# Patient Record
Sex: Female | Born: 1955 | Race: White | Hispanic: No | Marital: Single | Smoking: Former smoker
Health system: Southern US, Community
[De-identification: ages and names within clinical notes are randomized; demographics above are authoritative.]

## PROBLEM LIST (undated history)

## (undated) DIAGNOSIS — E039 Hypothyroidism, unspecified: Secondary | ICD-10-CM

## (undated) DIAGNOSIS — T8859XA Other complications of anesthesia, initial encounter: Secondary | ICD-10-CM

## (undated) DIAGNOSIS — G2581 Restless legs syndrome: Secondary | ICD-10-CM

## (undated) DIAGNOSIS — M545 Low back pain, unspecified: Secondary | ICD-10-CM

## (undated) DIAGNOSIS — G8929 Other chronic pain: Secondary | ICD-10-CM

## (undated) DIAGNOSIS — G4733 Obstructive sleep apnea (adult) (pediatric): Secondary | ICD-10-CM

## (undated) DIAGNOSIS — I480 Paroxysmal atrial fibrillation: Secondary | ICD-10-CM

## (undated) DIAGNOSIS — I2510 Atherosclerotic heart disease of native coronary artery without angina pectoris: Secondary | ICD-10-CM

## (undated) DIAGNOSIS — T4145XA Adverse effect of unspecified anesthetic, initial encounter: Secondary | ICD-10-CM

## (undated) DIAGNOSIS — N200 Calculus of kidney: Secondary | ICD-10-CM

## (undated) DIAGNOSIS — K219 Gastro-esophageal reflux disease without esophagitis: Secondary | ICD-10-CM

## (undated) DIAGNOSIS — I509 Heart failure, unspecified: Secondary | ICD-10-CM

## (undated) DIAGNOSIS — J9622 Acute and chronic respiratory failure with hypercapnia: Secondary | ICD-10-CM

## (undated) DIAGNOSIS — R4182 Altered mental status, unspecified: Secondary | ICD-10-CM

## (undated) DIAGNOSIS — R0902 Hypoxemia: Secondary | ICD-10-CM

## (undated) DIAGNOSIS — C541 Malignant neoplasm of endometrium: Secondary | ICD-10-CM

## (undated) DIAGNOSIS — I10 Essential (primary) hypertension: Secondary | ICD-10-CM

## (undated) DIAGNOSIS — F32A Depression, unspecified: Secondary | ICD-10-CM

## (undated) DIAGNOSIS — F329 Major depressive disorder, single episode, unspecified: Secondary | ICD-10-CM

## (undated) DIAGNOSIS — F988 Other specified behavioral and emotional disorders with onset usually occurring in childhood and adolescence: Secondary | ICD-10-CM

## (undated) DIAGNOSIS — R0689 Other abnormalities of breathing: Secondary | ICD-10-CM

## (undated) HISTORY — DX: Malignant neoplasm of endometrium: C54.1

## (undated) HISTORY — PX: DENTAL SURGERY: SHX609

## (undated) HISTORY — DX: Heart failure, unspecified: I50.9

## (undated) HISTORY — DX: Calculus of kidney: N20.0

## (undated) HISTORY — DX: Low back pain, unspecified: M54.50

## (undated) HISTORY — DX: Morbid (severe) obesity due to excess calories: E66.01

## (undated) HISTORY — DX: Acute and chronic respiratory failure with hypercapnia: J96.22

## (undated) HISTORY — DX: Hypothyroidism, unspecified: E03.9

## (undated) HISTORY — PX: BACK SURGERY: SHX140

## (undated) HISTORY — DX: Depression, unspecified: F32.A

## (undated) HISTORY — DX: Low back pain: M54.5

## (undated) HISTORY — DX: Major depressive disorder, single episode, unspecified: F32.9

## (undated) HISTORY — DX: Other chronic pain: G89.29

## (undated) HISTORY — DX: Gastro-esophageal reflux disease without esophagitis: K21.9

## (undated) HISTORY — PX: SP PERC NEPHROSTOMY: HXRAD349

## (undated) HISTORY — DX: Atherosclerotic heart disease of native coronary artery without angina pectoris: I25.10

## (undated) HISTORY — DX: Other specified behavioral and emotional disorders with onset usually occurring in childhood and adolescence: F98.8

## (undated) HISTORY — DX: Altered mental status, unspecified: R41.82

## (undated) HISTORY — DX: Obstructive sleep apnea (adult) (pediatric): G47.33

## (undated) HISTORY — PX: VAGINAL HYSTERECTOMY: SUR661

## (undated) HISTORY — DX: Hypoxemia: R09.02

## (undated) HISTORY — PX: TONSILLECTOMY: SUR1361

## (undated) HISTORY — PX: SHOULDER SURGERY: SHX246

## (undated) HISTORY — DX: Restless legs syndrome: G25.81

---

## 1998-02-11 ENCOUNTER — Ambulatory Visit (HOSPITAL_COMMUNITY): Admission: RE | Admit: 1998-02-11 | Discharge: 1998-02-11 | Payer: Self-pay | Admitting: *Deleted

## 1998-06-01 ENCOUNTER — Ambulatory Visit (HOSPITAL_COMMUNITY): Admission: RE | Admit: 1998-06-01 | Discharge: 1998-06-01 | Payer: Self-pay | Admitting: *Deleted

## 1998-06-24 ENCOUNTER — Ambulatory Visit (HOSPITAL_COMMUNITY): Admission: RE | Admit: 1998-06-24 | Discharge: 1998-06-24 | Payer: Self-pay | Admitting: Cardiovascular Disease

## 1999-03-21 ENCOUNTER — Ambulatory Visit (HOSPITAL_COMMUNITY): Admission: RE | Admit: 1999-03-21 | Discharge: 1999-03-21 | Payer: Self-pay | Admitting: Cardiovascular Disease

## 1999-09-19 ENCOUNTER — Encounter: Payer: Self-pay | Admitting: Cardiovascular Disease

## 1999-09-19 ENCOUNTER — Ambulatory Visit (HOSPITAL_COMMUNITY): Admission: RE | Admit: 1999-09-19 | Discharge: 1999-09-19 | Payer: Self-pay | Admitting: Cardiovascular Disease

## 1999-12-16 ENCOUNTER — Ambulatory Visit: Admission: RE | Admit: 1999-12-16 | Discharge: 1999-12-16 | Payer: Self-pay | Admitting: Psychiatry

## 2000-10-24 ENCOUNTER — Ambulatory Visit (HOSPITAL_BASED_OUTPATIENT_CLINIC_OR_DEPARTMENT_OTHER): Admission: RE | Admit: 2000-10-24 | Discharge: 2000-10-24 | Payer: Self-pay | Admitting: Orthopedic Surgery

## 2000-11-22 ENCOUNTER — Encounter: Admission: RE | Admit: 2000-11-22 | Discharge: 2001-02-06 | Payer: Self-pay | Admitting: Orthopedic Surgery

## 2002-09-26 ENCOUNTER — Emergency Department (HOSPITAL_COMMUNITY): Admission: EM | Admit: 2002-09-26 | Discharge: 2002-09-26 | Payer: Self-pay | Admitting: Emergency Medicine

## 2003-08-31 ENCOUNTER — Emergency Department (HOSPITAL_COMMUNITY): Admission: EM | Admit: 2003-08-31 | Discharge: 2003-08-31 | Payer: Self-pay | Admitting: Emergency Medicine

## 2005-09-05 ENCOUNTER — Encounter: Admission: RE | Admit: 2005-09-05 | Discharge: 2005-09-05 | Payer: Self-pay | Admitting: Cardiovascular Disease

## 2005-09-07 ENCOUNTER — Emergency Department (HOSPITAL_COMMUNITY): Admission: EM | Admit: 2005-09-07 | Discharge: 2005-09-07 | Payer: Self-pay | Admitting: Emergency Medicine

## 2005-09-08 ENCOUNTER — Ambulatory Visit (HOSPITAL_COMMUNITY): Admission: RE | Admit: 2005-09-08 | Discharge: 2005-09-09 | Payer: Self-pay | Admitting: Urology

## 2005-10-10 ENCOUNTER — Inpatient Hospital Stay (HOSPITAL_COMMUNITY): Admission: RE | Admit: 2005-10-10 | Discharge: 2005-10-12 | Payer: Self-pay | Admitting: Urology

## 2005-12-21 ENCOUNTER — Ambulatory Visit (HOSPITAL_BASED_OUTPATIENT_CLINIC_OR_DEPARTMENT_OTHER): Admission: RE | Admit: 2005-12-21 | Discharge: 2005-12-21 | Payer: Self-pay | Admitting: Urology

## 2006-01-30 ENCOUNTER — Ambulatory Visit: Payer: Self-pay | Admitting: Pulmonary Disease

## 2006-01-31 ENCOUNTER — Other Ambulatory Visit: Admission: RE | Admit: 2006-01-31 | Discharge: 2006-01-31 | Payer: Self-pay | Admitting: Obstetrics and Gynecology

## 2006-01-31 ENCOUNTER — Ambulatory Visit (HOSPITAL_BASED_OUTPATIENT_CLINIC_OR_DEPARTMENT_OTHER): Admission: RE | Admit: 2006-01-31 | Discharge: 2006-01-31 | Payer: Self-pay | Admitting: Pulmonary Disease

## 2006-02-13 ENCOUNTER — Ambulatory Visit: Payer: Self-pay | Admitting: Pulmonary Disease

## 2006-02-15 ENCOUNTER — Ambulatory Visit (HOSPITAL_COMMUNITY): Admission: RE | Admit: 2006-02-15 | Discharge: 2006-02-15 | Payer: Self-pay | Admitting: Cardiovascular Disease

## 2006-04-04 ENCOUNTER — Ambulatory Visit: Payer: Self-pay | Admitting: Pulmonary Disease

## 2006-05-08 ENCOUNTER — Encounter (INDEPENDENT_AMBULATORY_CARE_PROVIDER_SITE_OTHER): Payer: Self-pay | Admitting: *Deleted

## 2006-05-08 ENCOUNTER — Inpatient Hospital Stay (HOSPITAL_COMMUNITY): Admission: RE | Admit: 2006-05-08 | Discharge: 2006-05-11 | Payer: Self-pay | Admitting: Obstetrics and Gynecology

## 2006-05-31 ENCOUNTER — Ambulatory Visit: Payer: Self-pay | Admitting: Pulmonary Disease

## 2006-06-27 ENCOUNTER — Ambulatory Visit: Admission: RE | Admit: 2006-06-27 | Discharge: 2006-06-27 | Payer: Self-pay | Admitting: Gynecologic Oncology

## 2006-09-18 ENCOUNTER — Ambulatory Visit: Admission: RE | Admit: 2006-09-18 | Discharge: 2006-09-18 | Payer: Self-pay | Admitting: Gynecologic Oncology

## 2006-09-25 ENCOUNTER — Ambulatory Visit (HOSPITAL_COMMUNITY): Admission: RE | Admit: 2006-09-25 | Discharge: 2006-09-26 | Payer: Self-pay | Admitting: Gynecologic Oncology

## 2006-09-25 ENCOUNTER — Encounter (INDEPENDENT_AMBULATORY_CARE_PROVIDER_SITE_OTHER): Payer: Self-pay | Admitting: *Deleted

## 2006-10-23 ENCOUNTER — Ambulatory Visit: Admission: RE | Admit: 2006-10-23 | Discharge: 2006-10-23 | Payer: Self-pay | Admitting: Gynecologic Oncology

## 2006-10-29 ENCOUNTER — Encounter: Admission: RE | Admit: 2006-10-29 | Discharge: 2006-10-29 | Payer: Self-pay | Admitting: Cardiovascular Disease

## 2006-10-31 ENCOUNTER — Encounter: Admission: RE | Admit: 2006-10-31 | Discharge: 2006-10-31 | Payer: Self-pay | Admitting: Cardiovascular Disease

## 2006-11-07 ENCOUNTER — Inpatient Hospital Stay (HOSPITAL_COMMUNITY): Admission: AD | Admit: 2006-11-07 | Discharge: 2006-11-09 | Payer: Self-pay | Admitting: Cardiovascular Disease

## 2006-11-18 ENCOUNTER — Emergency Department (HOSPITAL_COMMUNITY): Admission: EM | Admit: 2006-11-18 | Discharge: 2006-11-18 | Payer: Self-pay | Admitting: Emergency Medicine

## 2006-11-29 ENCOUNTER — Ambulatory Visit: Payer: Self-pay | Admitting: Pulmonary Disease

## 2007-04-16 ENCOUNTER — Other Ambulatory Visit: Admission: RE | Admit: 2007-04-16 | Discharge: 2007-04-16 | Payer: Self-pay | Admitting: Gynecologic Oncology

## 2007-04-16 ENCOUNTER — Ambulatory Visit: Admission: RE | Admit: 2007-04-16 | Discharge: 2007-04-16 | Payer: Self-pay | Admitting: Gynecologic Oncology

## 2007-04-16 ENCOUNTER — Encounter (INDEPENDENT_AMBULATORY_CARE_PROVIDER_SITE_OTHER): Payer: Self-pay | Admitting: Specialist

## 2007-11-11 ENCOUNTER — Emergency Department (HOSPITAL_COMMUNITY): Admission: EM | Admit: 2007-11-11 | Discharge: 2007-11-11 | Payer: Self-pay | Admitting: Emergency Medicine

## 2007-11-11 ENCOUNTER — Inpatient Hospital Stay (HOSPITAL_COMMUNITY): Admission: EM | Admit: 2007-11-11 | Discharge: 2007-11-14 | Payer: Self-pay | Admitting: Emergency Medicine

## 2007-11-11 ENCOUNTER — Ambulatory Visit: Payer: Self-pay | Admitting: Pulmonary Disease

## 2007-12-23 DIAGNOSIS — G4733 Obstructive sleep apnea (adult) (pediatric): Secondary | ICD-10-CM | POA: Insufficient documentation

## 2009-02-01 ENCOUNTER — Ambulatory Visit (HOSPITAL_COMMUNITY): Admission: RE | Admit: 2009-02-01 | Discharge: 2009-02-01 | Payer: Self-pay | Admitting: Cardiovascular Disease

## 2011-01-01 ENCOUNTER — Encounter: Payer: Self-pay | Admitting: Cardiovascular Disease

## 2011-03-28 LAB — GLUCOSE, CAPILLARY
Glucose-Capillary: 105 mg/dL — ABNORMAL HIGH (ref 70–99)
Glucose-Capillary: 96 mg/dL (ref 70–99)

## 2011-03-30 ENCOUNTER — Emergency Department (HOSPITAL_COMMUNITY)
Admission: EM | Admit: 2011-03-30 | Discharge: 2011-03-30 | Disposition: A | Discharge status: Home / Self Care | Payer: Medicare Other | Attending: Emergency Medicine | Admitting: Emergency Medicine

## 2011-03-30 DIAGNOSIS — E860 Dehydration: Secondary | ICD-10-CM | POA: Insufficient documentation

## 2011-03-30 DIAGNOSIS — R748 Abnormal levels of other serum enzymes: Secondary | ICD-10-CM | POA: Insufficient documentation

## 2011-03-30 DIAGNOSIS — I509 Heart failure, unspecified: Secondary | ICD-10-CM | POA: Insufficient documentation

## 2011-03-30 DIAGNOSIS — F411 Generalized anxiety disorder: Secondary | ICD-10-CM | POA: Insufficient documentation

## 2011-03-30 DIAGNOSIS — I10 Essential (primary) hypertension: Secondary | ICD-10-CM | POA: Insufficient documentation

## 2011-03-30 DIAGNOSIS — R112 Nausea with vomiting, unspecified: Secondary | ICD-10-CM | POA: Insufficient documentation

## 2011-03-30 DIAGNOSIS — E119 Type 2 diabetes mellitus without complications: Secondary | ICD-10-CM | POA: Insufficient documentation

## 2011-03-30 LAB — CBC
Hemoglobin: 14.1 g/dL (ref 12.0–15.0)
MCH: 27.1 pg (ref 26.0–34.0)
MCHC: 30.7 g/dL (ref 30.0–36.0)
MCV: 88.1 fL (ref 78.0–100.0)
Platelets: 193 10*3/uL (ref 150–400)
RBC: 5.21 MIL/uL — ABNORMAL HIGH (ref 3.87–5.11)
RDW: 15.2 % (ref 11.5–15.5)
WBC: 16.9 10*3/uL — ABNORMAL HIGH (ref 4.0–10.5)

## 2011-03-30 LAB — URINALYSIS, ROUTINE W REFLEX MICROSCOPIC
Glucose, UA: NEGATIVE mg/dL
Ketones, ur: NEGATIVE mg/dL
Nitrite: NEGATIVE
Specific Gravity, Urine: 1.022 (ref 1.005–1.030)
Urobilinogen, UA: 0.2 mg/dL (ref 0.0–1.0)
pH: 5 (ref 5.0–8.0)

## 2011-03-30 LAB — COMPREHENSIVE METABOLIC PANEL
AST: 570 U/L — ABNORMAL HIGH (ref 0–37)
Albumin: 3.4 g/dL — ABNORMAL LOW (ref 3.5–5.2)
Alkaline Phosphatase: 101 U/L (ref 39–117)
BUN: 30 mg/dL — ABNORMAL HIGH (ref 6–23)
Chloride: 101 mEq/L (ref 96–112)
Creatinine, Ser: 1.75 mg/dL — ABNORMAL HIGH (ref 0.4–1.2)
GFR calc Af Amer: 36 mL/min — ABNORMAL LOW (ref >60)
Glucose, Bld: 121 mg/dL — ABNORMAL HIGH (ref 70–99)
Potassium: 5.1 mEq/L (ref 3.5–5.1)
Total Bilirubin: 1.4 mg/dL — ABNORMAL HIGH (ref 0.3–1.2)
Total Protein: 7.2 g/dL (ref 6.0–8.3)

## 2011-03-30 LAB — DIFFERENTIAL
Basophils Absolute: 0 10*3/uL (ref 0.0–0.1)
Basophils Relative: 0 % (ref 0–1)
Eosinophils Absolute: 0 10*3/uL (ref 0.0–0.7)
Eosinophils Relative: 0 % (ref 0–5)
Lymphocytes Relative: 6 % — ABNORMAL LOW (ref 12–46)
Lymphs Abs: 1.1 10*3/uL (ref 0.7–4.0)
Monocytes Relative: 4 % (ref 3–12)
Neutro Abs: 15.2 10*3/uL — ABNORMAL HIGH (ref 1.7–7.7)
Neutrophils Relative %: 90 % — ABNORMAL HIGH (ref 43–77)

## 2011-03-30 LAB — LIPASE, BLOOD: Lipase: 37 U/L (ref 11–59)

## 2011-03-30 LAB — URINE MICROSCOPIC-ADD ON

## 2011-04-25 NOTE — Discharge Summary (Signed)
[name], KANNAN               ACCOUNT NO.:  [id number]   MEDICAL RECORD NO.:  [id number]          PATIENT TYPE:  INP   LOCATION:  6708                         FACILITY:  [facility]   PHYSICIAN:  Devra Dopp, MSN, ACNP DATE OF BIRTH:  12/22/1955   DATE OF ADMISSION:  11/11/2007  DATE OF DISCHARGE:  11/14/2007                               DISCHARGE SUMMARY   No dictation for this job.      Devra Dopp, MSN, ACNP     SM/MEDQ  D:  11/14/2007  T:  11/14/2007  Job:  [id number]

## 2011-04-25 NOTE — Consult Note (Signed)
[name], [name]               ACCOUNT NO.:  [id number]   MEDICAL RECORD NO.:  [id number]          PATIENT TYPE:  OUT   LOCATION:  GYN                          FACILITY:  [facility]   PHYSICIAN:  John T. Kyla Balzarine, M.D.    DATE OF BIRTH:  1956/06/05   DATE OF CONSULTATION:  04/16/2007  DATE OF DISCHARGE:                                 CONSULTATION   CHIEF COMPLAINT:  Followup after completion vaginal trachelectomy for  endometrial cancer.   HISTORY OF PRESENT ILLNESS:  This patient had a supracervical  hysterectomy for adenomatous hyperplasia performed in May 2007.  Final  pathology revealed noninvasive endometrial adenocarcinoma, FIGO grade 1.  Tubes and ovaries were removed.  The patient underwent a completion  vaginal trachelectomy in October 2007 with findings only of chronic  cervicitis.  Perioperatively, she did well but in the past several  months apparently was hospitalized with congestive heart failure which  has responded to increasing her dose of Lasix.   PAST MEDICAL HISTORY:  Is significant for multiple concerns including:  1. Morbid obesity.  2. Adult-onset diabetes.  3. Hypertension.  4. Sleep apnea.  5. Depression.  6. Hypothyroidism.  7. GERD.  8. Kidney stones.  9. Status post ruptured disk repair x2.  10.Chronic back pain related to a third herniated disk.  11.Post nephrostomy tube for nephrolithiasis.  12.Shoulder surgery.  13.Remote tubal ligation.  14.Status post remote T&A.  15.Status post NSVD x2.   MEDICATIONS:  Include Synthroid, furosemide, potassium supplementation,  propranolol, Cozaar, Avandamet, Zyrtec, glipizide, methylphenidate,  metformin, Lantus insulin, Prevacid, lorazepam, Zoloft, sodium  bicarbonate, Vicodin, vitamin C, and ferrous sulfate.   ALLERGIES:  Include hyper sensitivity to NSAIDs, SULFA DRUGS and  ERYTHROMYCIN, but this appears to be not classical allergies to these  medical occasions  and seem to indicate increased  sensitivity to those  medications.   PERSONAL AND SOCIAL HISTORY:  Negative, nonsmoker.   FAMILY HISTORY:  Negative for gynecologic, breast or colon cancer.   REVIEW OF SYSTEMS:  Chronic pain syndrome related to back.  Frequent  GERD, but otherwise negative in 10 systems exam.   PHYSICAL EXAMINATION:  VITAL SIGNS:  Weight 212 pounds.  GENERAL:  The patient is anxious, alert and oriented x3 in no acute  distress.  ABDOMEN:  Soft and benign. Well-healed incision.  No hernia, mass or  organomegaly palpable.  BACK:  No back or CVA tenderness.  LYMPH:  No pathologic lymphadenopathy.  EXTREMITIES:  No tenderness or Homan's.  PELVIC:  External genitalia and BUS, bladder and urethra, and vaginal  mucosa are clear.  No mucosal lesions noted.  Bimanual and rectovaginal  examinations disclose no palpable mass, tenderness, absent uterus and  cervix.   ASSESSMENT:  Endometrial cancer.   PLAN:  The patient can be seen at annual intervals here and at the  interim examination, can see Dr. Huel Cote, such that each see  her every 6 months.      John T. Kyla Balzarine, M.D.  Electronically Signed     JTS/MEDQ  D:  04/16/2007  T:  04/16/2007  Job:  [id number]  cc:   Huel Cote, M.D.  Fax: [phone]   [name], M.D.  Fax: [phone]   Lindaann Slough, M.D.  Fax: [phone]   Roseanna Rainbow, M.D.  Fax: [phone]   Telford Nab, R.N.  [address]  [place], [place] [address]

## 2011-04-25 NOTE — Discharge Summary (Signed)
[name], [name]               ACCOUNT NO.:  [id number]   MEDICAL RECORD NO.:  [id number]          PATIENT TYPE:  INP   LOCATION:  6708                         FACILITY:  [facility]   PHYSICIAN:  Oretha Milch, MD      DATE OF BIRTH:  1956-02-14   DATE OF ADMISSION:  11/11/2007  DATE OF DISCHARGE:  11/14/2007                               DISCHARGE SUMMARY   DISCHARGE DIAGNOSES:  Acute renal failure, vent-dependent respiratory  failure, secondary to methadone overdose.   HPI:  [name] is a 55 year old white female, who is a primary  patient of Dr. Orpah Cobb.  She has a long history of chronic pain.  She was in her usual state of ill health and was in considerable pain,  where she used a friend's methadone.  She presented in the emergency  department and was found to be somnolent.  She was given Narcan and  became extremely agitated and required orotracheal intubation and  sedation at that time to manage her airway.  Pulmonary critical care was  asked to call and monitor her care.   LABORATORY DATA:  Hemoglobin 12.7, hematocrit 39.6, WBC 9.6, platelets  of 237.  Sodium 139, potassium 3.5, chloride 97, CO2 36, BUN 9,  creatinine 0.72, glucose 87.  Troponin I was 0.07.  Magnesium 2.1,  calcium 8.7, phosphorus 3.6.  BAL demonstrated nonpathogenic normal  flora.  Chest x-ray demonstrated endotracheal tube in appropriate  position, left IJ catheter in the SVC.  Postural right lower lobe  collapse was noted.   HOSPITAL COURSE BY DISCHARGE DIAGNOSES:  1. Vent-dependent respiratory failure, secondary to narcotic overdose.      She was admitted to [facility], placed in the intensive      care unit and was successfully liberated from mechanical      ventilatory support within 24 hours.  She has reached maximal      hospital benefit and is ready for discharge home.  Her endotracheal      tube has been removed.  Her left internal jugular three-port CVP      has been  removed.  Peripheral IVs have been removed.  Chest x-ray      shows no acute disease process.  2. Narcotic overdose with respiratory depression.  She notes taking 10      mg of methadone and one tab of Vicodin prior to being found      unconscious.  Methadone was not prescribed by any M.D.  She had a      psychiatric evaluation.  3. Neurologic, chronic pain and depression.  She takes narcotics for      chronic pain and we have placed her back on her Zoloft.  4. History of coronary artery disease.  No chest pain.  Her troponin      I's were within normal limits.  5. Diabetes mellitus.  Her insulin has been decreased to 10 units      q.h.s. with adequate capillary blood glucose levels.  6. Hypothyroidism.  She remains on her Synthroid.  7. Obstructive sleep apnea.  She  remains on her CPAP and she will      follow up with Dr. Craige Cotta.   DISCHARGE MEDICATIONS:  1. She will be on Lasix 40 mg daily.  2. Cozaar 100 mg daily.  3. Synthroid 75 micrograms daily.  4. Inderal 20 mg daily.  5. Potassium chloride 40 mEq daily.  6. Lantus 10 units subcu h.s.  7. Zoloft 150 mg daily.  8. Prevacid 30 mg daily.  9. Zyrtec 10 mg daily.  10.Keflex 500 mg t.i.d. times two more days and then discontinue.   DIET:  Low-sodium heart-healthy diet.   FOLLOWUP:  She has a followup appointment with Dr. Algie Coffer on December  11 at 2:30 p.m. and with Dr. Coralyn Helling, January 13 at 9:40 a.m.   She maintains on her CPAP and will do so at home.      Devra Dopp, MSN, ACNP      Oretha Milch, MD  Electronically Signed    SM/MEDQ  D:  11/14/2007  T:  11/14/2007  Job:  [id number]   cc:   [name], M.D.

## 2011-04-25 NOTE — H&P (Signed)
NAMEHADYN, [name]               ACCOUNT NO.:  [id number]   MEDICAL RECORD NO.:  [id number]          PATIENT TYPE:  EMS   LOCATION:  MAJO                         FACILITY:  [facility]   PHYSICIAN:  Coralyn Helling, MD        DATE OF BIRTH:  [birth year]   DATE OF ADMISSION:  11/11/2007  DATE OF DISCHARGE:                              HISTORY & PHYSICAL   ADMITTING DIAGNOSIS:  Acute respiratory failure.   [name] is a 55 year old female who was seen at [facility]  earlier in the day for nausea and vomiting and was discharged home.  After this, her husband found her later on unresponsive.  He called the  paramedics, and she was brought to [facility].  She was given  Narcan in the emergency room and woke up, but then became very agitated.  While she was awake, she had told the emergency room physician that she  had taken some methadone that she found that home, although I am not  sure how much methadone she actually took.  She then became again quite  agitated with questionable seizure activity and was given Ativan, which  then caused her to develop respiratory failure again, and she was  subsequently intubated.  There was no family immediately available to  provide further information.   PAST MEDICAL HISTORY:  1. Significant for endometrial carcinoma.  She is status post      hysterectomy.  2. His diabetes.  3. Morbid obesity.  4. Severe obstructive sleep apnea.  5. Depression.  6. Hypothyroidism.  7. His gastroesophageal reflux disease.  8. Nephrolithiasis status post nephrostomy tube placements previously.  9. Chronic low back pain.  10.Shoulder surgery.  11.Tubal ligation.  12.Restless leg syndrome.  13.Coronary artery disease.  14.Congestive heart failure with ejection fraction of 50%, for which      she sees Dr. Algie Coffer.   ALLERGIES:  SHE HAS ALLERGIES TO ERYTHROMYCIN WHICH CAUSES HER TO GET GI  UPSET, NSAIDS WHICH CAUSES HER TO DEVELOP CHEST PAIN, SULFA  WHICH CAUSES  HER TO DEVELOP PROBLEMS WITH HER BREATHING, ASPIRIN CAUSES HER TO  DEVELOP PROBLEMS, REQUIP CAUSES CHEST PAIN, WELLBUTRIN, PROZAC AND  ELAVIL.   FAMILY HISTORY:  Is not available.   SOCIAL HISTORY:  Not available.   REVIEW OF SYSTEMS:  Unobtainable at this time.   PHYSICAL EXAM:  VITAL SIGNS:  She is seen emergency room.  She is  intubated, sedated and paralyzed.  Blood pressure is 172/83, heart rate  is 92 and regular, oxygen saturation 100%, respiratory rate is 14.  HEENT:  Pupils are reactive.  She has an endotracheal tube in place.  NECK:  She has a short, stocky neck.  There is no lymphadenopathy, no  jugular venous distention.  HEART:  S1-S2, regular rate rhythm.  CHEST:  She has coarse breath sounds bilaterally.  ABDOMEN:  Obese, soft, nontender.  Decreased bowel sounds.  GU:  No obvious lesions.  EXTREMITIES:  She has 2+ nonpitting edema with chronic venous stasis  changes of the lower extremities.  NEUROLOGIC:  She is paralyzed.  Chest x-ray shows endotracheal tube in good position with bilateral  congestion.  Creatinine is 0.8.  Hemoglobin on I-stat was 18, hematocrit  53, sodium is 135, potassium 6.6, chloride is 102, glucose is 410, BUN  is 18.   OUTPATIENT MEDICATIONS:  Her outpatient medications include Zyrtec,  prochlorperazine, Coreg, Levothyroxine, ferrous sulfate, Lipitor,  Glucotrol, potassium, Lasix, sertraline, Cardizem, Lantus, Prevacid,  hydrocodone, cetirizine, cephalexin and Cozaar.   IMPRESSION:  1. Acute respiratory failure, possibly related to drug overdose      methadone.  I would continue her on full ventilatory support for      the time being, until her mental status improves.  I would try to      limit the amount of sedation and analgesia that she requires,      although we may need to keep some on board.  I would follow up on      her arterial blood gas, as well as her chest x-ray.  2. Possible aspiration.  I will hold off on  starting her antibiotics      for the time being, unless she develops worsening infiltrate, fever      or increase in her white count.  I will, however, check a BAL.  3. Hyperglycemia.  She is to be started on the hyperglycemia protocol.  4. History of coronary disease with hypertension and a systolic      dysfunction.  I will continue her on hydralazine as needed and then      gradually restart her outpatient medications as needed, once she      improves clinically.  I would also follow up on her cardiac enzymes      and her EKG.  I would also continue her on Lasix.  5. Hypothyroidism.  She is to continue on Synthroid, and I will check      a TSH on her as well.  6. History of obstructive sleep apnea.  She will need to be on PAP      therapy, after she is extubated.  7. Hyperkalemia.  I would follow up on her electrolytes.  8. Depression.  I would hold off on starting her anti-depressive      medications for the time being.  9. History of reflux disease.  I would continue her on Protonix.  10.History of endometrial carcinoma.  This did not appear to be an      active issue at the present time.  11.I will start her on DVT prophylaxis with Lovenox.      Coralyn Helling, MD  Electronically Signed     VS/MEDQ  D:  11/11/2007  T:  11/11/2007  Job:  [id number]

## 2011-04-25 NOTE — Consult Note (Signed)
NAMEALLYCIA, PITZ               ACCOUNT NO.:  [id number]   MEDICAL RECORD NO.:  [id number]          PATIENT TYPE:  INP   LOCATION:  6708                         FACILITY:  [facility]   PHYSICIAN:  Antonietta Breach, M.D.  DATE OF BIRTH:  1956/01/04   DATE OF CONSULTATION:  11/13/2007  DATE OF DISCHARGE:  11/14/2007                                 CONSULTATION   REASON FOR CONSULTATION:  Depression.   REQUESTING PHYSICIAN:  Sandrea Hughs.   HISTORY OF PRESENT ILLNESS:  Ms. Luff is a 55 year old female admitted  to the [facility] on November 11, 2007.  She developed  respiratory arrest and required intubation.   The patient has now been extubated.  There was concern that the patient  may have taken a deliberate methadone overdose.  She was found  unresponsive by her husband, prior to admission.   The patient consistently denies the suicide attempts.  She denies any  suicidal thoughts.  She is oriented to all spheres.  Her memory function  is intact.  She is socially appropriate and cooperative with healthcare.  She is not having any thoughts of harming others.  She has no delusions  or hallucinations.   She has had some decreased energy, lasting approximately 3 weeks.  She  has been taking Zoloft for anti-depression.  She does describe normal  interests and constructive future goals.  Her energy is still mildly  decreased.  Her concentration is still slightly decreased.   PAST PSYCHIATRIC HISTORY:  The patient describes at least two major  depressive episodes in the past involving low energy, difficulty  concentrating, anhedonia.  She has been treated in the past with Prozac,  which eventually was no longer effective at preventing the depression.  She has been switched to Zoloft, which has been effective at treating  and preventing depression at 150 mg daily.   She has no history of elevated mood or decreased need for sleep.  She  has no history of hallucinations or  delusions.  She has no history of  suicide attempt.  She denies any illegal drugs or alcohol use.   FAMILY PSYCHIATRIC HISTORY:  None known.   SOCIAL HISTORY:  Mrs. Tricarico is married.  Religion:  [employer].  Occupation:  Medically disabled.   PAST MEDICAL HISTORY:  Chronic back pain status post ventilatory  dependent respiratory failure and inadvertent methadone overdose.   MEDICATIONS:  MAR is reviewed.   REVIEW OF SYSTEMS:  CONSTITUTIONAL:  Afebrile.  No weight loss.  HEAD:  No trauma.  EYES:  No visual changes.  EARS:  No hearing impairment  NOSE:  No rhinorrhea.  MOUTH/THROAT:  No sore throat.  NEUROLOGIC:  No  focal motor or sensory deficits. PSYCHIATRIC:  As above.  CARDIOVASCULAR:  No chest pain, palpitation.  RESPIRATORY:  No cough.  GASTROINTESTINAL:  No nausea, vomiting, diarrhea.  GENITOURINARY:  No  dysuria.  SKIN:  Unremarkable.  ENDOCRINE/METABOLIC:  No heat or cold  intolerance.  MUSCULOSKELETAL:  No deformities.  HEMATOLOGIC/LYMPHATIC:  Unremarkable.   EXAMINATION:  VITAL SIGNS:  Temperature 97.4, pulse 77, respiratory rate  14, blood pressure 140/72, O2 saturation on 2 liters 98%.  GENERAL APPEARANCE:  Mrs. Barkan is a middle-aged female, partially  reclined in a supine position in her hospital bed, with no abnormal  involuntary movements.   OTHER MENTAL STATUS EXAM:  Mrs. Laursen is alert.  Her attention span is  within normal limits.  Her eye contact is good.  Her concentration is  slightly decreased.  Affect is mildly flat at baseline, with a broad  appropriate response.  Mood is within normal limits.  She is oriented to  all spheres.  Memory is intact to immediate, recent, and remote, except  for the obtundation and the ventilatory dependent.  Fund of knowledge  and intelligence are within normal limits.  Speech is mildly soft.  Prosody is normal.  There is no dysarthria.  Thought process logical,  coherent, goal-directed.  No looseness of associations.   Language,  expression, and comprehension are intact, abstraction intact.  Thought  content:  No thoughts of harming herself, no thoughts of harming others,  no delusions, no hallucinations.  Insight is partial.  Judgment is  intact.   ASSESSMENT:  Axis I:  293 0.83, mood disorder not otherwise specified, depressed (functional  and general medical factors).  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  The  patient's depression is well treated.  She still has some residual  decreased energy which could resolve, as her general medical status  continues to improve.  Axis II:  Deferred  Axis III:  See general medical section.  Axis IV:  General medical.  Axis V:  55.   Mrs. Saintil not at risk to harm herself or others.  She agrees to call  emergency services immediately for any thoughts of harming herself,  thoughts of harming others or distress.   The undersigned provided ego-supportive psychotherapy and education.   RECOMMENDATIONS:  Would continue the patient's Zoloft to 150 mg daily  for anti-depression maintenance.  Would have this patient follow up at  one of the psychiatric clinics attached to [facility], [place], or [facility].  Another alternative would be her county  mental health center.  The goal of the above followup would be for  ongoing psychotropic medication management and maintenance for anti-  depression.      Antonietta Breach, M.D.  Electronically Signed     JW/MEDQ  D:  12/01/2007  T:  12/02/2007  Job:  [id number]

## 2011-04-28 NOTE — Op Note (Signed)
[name], [name]               ACCOUNT NO.:  [id number]   MEDICAL RECORD NO.:  [id number]          PATIENT TYPE:  AMB   LOCATION:  NESC                         FACILITY:  [facility]   PHYSICIAN:  Lindaann Slough, M.D.  DATE OF BIRTH:  Sep 22, 1956   DATE OF PROCEDURE:  12/21/2005  DATE OF DISCHARGE:                                 OPERATIVE REPORT   PREOPERATIVE DIAGNOSIS:  Right renal pelvis stone.   POSTOPERATIVE DIAGNOSIS:  Right renal pelvis stone.   PROCEDURE:  Cystoscopy, right retrograde pyelogram and insertion of right  ureteral catheter.   SURGEON:  Dr. Brunilda Payor.   ANESTHESIA:  General.   INDICATIONS FOR PROCEDURE:  The patient is a 55 year old female who had PCNL  of a right renal calculi on October30, 2006. Nephrostogram postoperatively  showed some small filling defect in the lower pole of the kidney that were  compatible with blood clots. She was seen in the office on December 06, 2005  for right flank discomfort and fever. CT scan of the abdomen and pelvis  showed an 11 x 7 mm stone in the renal pelvis. The stone is nonopaque. She  is scheduled today for insertion of right ureteral catheter, right  retrograde pyelogram before ESL.   Under general anesthesia, the patient was prepped and draped and placed in  the dorsolithotomy position a #22 [place] Wappler cystoscope could not be  passed in the bladder because of meatal stenosis. The meatus was then  dilated up to #30-French then the #22 Wappler cystoscope was inserted in the  bladder. The bladder mucosa is normal. There is no stone or tumor in the  bladder. The ureteral orifices are in normal position and shape with clear  efflux.   RETROGRADE PYELOGRAM:   A guidewire was passed through an open-ended ureteral catheter and passed  through the cystoscope. The guidewire was then advanced through the open-  ended catheter into the ureter and the open-ended catheter was passed over  the guidewire into the renal pelvis.  The guidewire was then removed.  Contrast was then injected through the open-ended catheter. There is a  filling defect in the renal pelvis consistent with the renal stone. There is  also a smaller filling defect in the renal pelvis that could be a much  smaller stone in the renal pelvis adjacent to the larger stone.   The cystoscope was then removed. A #16-French Foley catheter was then passed  in the bladder and the ureteral catheter was secured to the Foley. The  ureteral catheter will be used later on today for visualization of the  nonopaque renal stone for ESL.   The patient tolerated the procedure well and left the OR in satisfactory  condition to post anesthesia care unit.      Lindaann Slough, M.D.  Electronically Signed     MN/MEDQ  D:  12/21/2005  T:  12/22/2005  Job:  [id number]

## 2011-04-28 NOTE — Op Note (Signed)
[name], [name]               ACCOUNT NO.:  [id number]   MEDICAL RECORD NO.:  [id number]          PATIENT TYPE:  AMB   LOCATION:  DAY                          FACILITY:  [facility]   PHYSICIAN:  Lindaann Slough, M.D.  DATE OF BIRTH:  02/23/1956   DATE OF PROCEDURE:  10/09/2005  DATE OF DISCHARGE:                                 OPERATIVE REPORT   PREOPERATIVE DIAGNOSIS:  Right renal calculi.   POSTOPERATIVE DIAGNOSIS:  Right renal calculi.   PROCEDURE:  Right percutaneous nephrolithotomy.   SURGEON:  Danae Chen, M.D. and D. Oley Balm, M.D.   ANESTHESIA:  General.   INDICATIONS FOR PROCEDURE:  The patient is a 55 year old female who was seen  in the emergency room a month ago for severe right flank pain. CT scan of  the abdomen and pelvis showed a 2 cm stone in the renal pelvis and an 8 mm  calculus at the right UPJ and another stone in the lower pole of the kidney.  She had a right double-J catheter inserted. She is scheduled today for right  percutaneous nephrolithotomy. A right percutaneous nephrostomy was done this  morning and she is for the second stage of the procedure.   Under general anesthesia, the patient was prepped and draped and placed in  the prone position. The nephrostomy tract was then dilated by Dr. Deanne Coffer  and an Amplatz sheath was placed in the nephrostomy tract and the  nephroscope was passed through the Amplatz sheath. Then the larger stone was  visualized in the renal pelvis and with the lithoclast, the stone was broken  in multiple stone fragments. The stone fragments were then removed with the  three prong forceps. Then the nephroscope was removed, a flexible cystoscope  was passed through the Amplatz sheath and passed in the lower pole of the  kidney. The lower pole renal calculus was visualized and removed with a  nitinol stone basket. The cystoscope was then passed in the upper pole  calices and there was no evidence of stone .  There was no  evidence of  remaining stone fragment in the kidney. Then a #22 Councill tip catheter was  passed through the Amplatz sheath into the renal pelvis and the Amplatz  sheath was removed. A ureteral catheter was then passed over one of the  guidewires into the ureter and the guidewires were removed. The ureteral  catheter and the Councill tip catheter were then secured to the skin with #2-  0 silk.   Estimated blood loss minimal.   Blood replacement none.   Needle, sponge and instrument counts were correct on two occasions.   The patient tolerated the procedure well and left and left the OR in  satisfactory condition to post anesthesia care unit.      Lindaann Slough, M.D.  Electronically Signed     MN/MEDQ  D:  10/09/2005  T:  10/09/2005  Job:  [id number]

## 2011-04-28 NOTE — H&P (Signed)
[name], [name] NO.:  [id number]   MEDICAL RECORD NO.:  [id number]          PATIENT TYPE:  AMB   LOCATION:  SDC                           FACILITY:  [facility]   PHYSICIAN:  Huel Cote, M.D. DATE OF BIRTH:  Aug 21, 1956   DATE OF ADMISSION:  DATE OF DISCHARGE:                                HISTORY & PHYSICAL   The patient is a 55 year old G2, P2 who is coming in for a scheduled total  abdominal hysterectomy, given irregular and abnormal uterine bleeding and a  diagnosis of complex endometrial hyperplasia with focal atypia on  endometrial biopsy.  The patient first presented in February of 2007  complaining of constant spotting daily.  She also complained of intermittent  bleeding for up to two weeks at times and for this reason was worked up with  endometrial biopsy and ultrasound.  The endometrial biopsy was as stated.  The ultrasound demonstrated thickened endometrium up to 14 mm in size, and  the right ovary was noted to have an echogenic calcification which could  have represented a small dermoid plug.  Left ovary appeared normal.  The  patient does have a poor medical history.  She is being treated for  borderline diabetes, chronic hypertension, depression, sleep apnea,  hypothyroidism, reflux disease and does have a history of kidney stones. She  also has very poor dentition and is scheduled to have significant oral  surgery which she has not been able to have performed up to this time.   OBSTETRICAL HISTORY:  Her past obstetrical history is significant for two  vaginal deliveries.   PAST SURGICAL HISTORY:  She had a ruptured disk x2.  She had a nephrostomy  tube for persistent nephrolithiasis, shoulder surgery and tubal ligation.   GYNECOLOGICAL HISTORY:  Her GYN history is significant for Pap smear at 18  years that was abnormal with normal since that time.   CURRENT MEDICATIONS:  Avandia, propranolol, Norvasc, Levoxyl, Prevacid,  Zoloft and  Cozaar.  She did have a preoperative thyroid and hemoglobin A1c  performed.  Her hemoglobin A1c was borderline at 7.1.  A random serum  glucose was 107, and her thyroid was within normal limits.  The patient  reports her blood sugars are anywhere from 140 to 190.  This is being  monitored by her primary M.D. who had placed her on the Avandia.   The patient denies any history of breast cancer or colon cancer.   PHYSICAL EXAMINATION:  VITAL SIGNS:  Her height is 5 foot 5, weight is 301.  Blood pressure is 152/88.  CARDIAC:  Regular rate and rhythm.  LUNGS:  Clear.  ABDOMEN:  Soft, obese and nontender.  PELVIC:  The patient is noted to have a very long vagina, and the cervix is  quite distal in the vagina. The uterus and the ovaries are not palpable.   ASSESSMENT/PLAN:  The patient was counseled as to her finding of hyperplasia  with focal atypia.  We discussed that could be a slight incidence of  endometrial cancer associated with this finding and that the patient would  need  treatment in some form.  We discussed placing an IUD Marina to decrease  her risk of endometrial cancer.  However, the patient does not wish to  pursue this route and wants definitive surgery that will eliminate her  abnormal bleeding.  We discussed the risks of surgery including her  diabetes, chronic hypertension and overweight status.  The options for  surgical approach were discussed with the patient, and she reported that she  had had an attempted laparoscopic procedure for tubal ligation in the past  when she was actually much lighter in weight which could not be performed,  and the doctor had to do a mini laparotomy to perform the procedure.  She  also has had some problems with anesthesia if this is very long, and for  these reasons, we felt the safest approach would be an abdominal approach at  which point the anesthesia time could be limited and given the lack of  success of laparoscopy in the past.  The  patient understands the risks of  surgery including bleeding, infection and possible damage to bowel and  bladder. She understands that these would require additional surgery if any  complications arose.  We also discussed about removing her ovaries and  probably will proceed with removing the right ovary should any dermoid or  pathology be visible.  If the left ovary is normal, it could be retained,  and the patient is agreeable to this to prevent abrupt menopause symptoms.  We will try to have a pathology consult intraoperatively just to assure  there is no superficial endometrial cancer present in light of the atypia on  her biopsy, and should this be benign, I will likely leave her left ovary in  place with removal of the right ovary.  The patient agrees to proceed with  the surgery as stated.  She was instructed to bring her sleep apnea machine  with her to the hospital for post-surgical stay and has a preoperative  appointment arranged on Friday, May 26.      Huel Cote, M.D.  Electronically Signed     KR/MEDQ  D:  05/04/2006  T:  05/04/2006  Job:  [id number]

## 2011-04-28 NOTE — Op Note (Signed)
[name], [name]               ACCOUNT NO.:  [id number]   MEDICAL RECORD NO.:  [id number]          PATIENT TYPE:  AMB   LOCATION:  DAY                          FACILITY:  [facility]   PHYSICIAN:  Lindaann Slough, M.D.  DATE OF BIRTH:  1955-12-19   DATE OF PROCEDURE:  09/08/2005  DATE OF DISCHARGE:                                 OPERATIVE REPORT   PREOPERATIVE DIAGNOSES:  1.  Right hydronephrosis  2.  Nephrolithiasis.   POSTOPERATIVE DIAGNOSES:  1.  Right hydronephrosis  2.  Nephrolithiasis.   PROCEDURE:  1.  Cystourethroscopy.  2.  Right retrograde pyelography.  3.  Right 6 x 24 double-J stent placement.   SURGEON:  Dr. Su Grand   ASSISTANT:  Dr. Glade Nurse   ANESTHESIA:  General endotracheal.   SPECIMENS:  None.   PROCEDURE:  The patient was identified by her wrist bracelet and brought to  room 8, where she received preoperative antibiotics and was prepped and  draped in the usual sterile fashion with care taken to minimize compartment  syndrome and neuropathy after receiving general anesthesia.  Next we  inserted a 22-French cystoscope with 12 and 70-degree lenses.  The patient  underwent pancystourethroscopy which demonstrated ureteral orifices in their  normal anatomic position.  Her left was effluxing clear urine.  The  remainder of her mucosa was without foreign body or mucosal abnormality.   We next turned our attention to the right ureteral orifice which was  cannulated with a cone-tip catheter.  Retrograde pyelogram was obtained  which demonstrated a proximal 7-8 mm stone at the right UPJ.  Upon contrast  reaching the level of the stone, it was noted to migrate out back into the  renal pelvis.  The remainder of her urothelium distal to the UPJ was without  filling defect.  Next, we removed the cone-tipped catheter and placed a  0.038 guidewire to the level of the right upper pole under direct  fluoroscopic guidance.  We then using the Seldinger technique,  placed a 6  French end-hole catheter over the wire to the level of the right renal  pelvis.  Again we obtained a retrograde pyelogram showing the renal pelvis  and calices which demonstrated 2 filling defects, both consistent with  nephrolithiasis with the patient's known 2 cm and 7 mm stone.  The remainder  of her urothelium was without filling defect.  There was mild fullness of  her calyces and renal pelvis.  Next, we placed the guidewire back through  the end-hole catheter to the level of the upper pole under direct  arthroscopic guidance.  The end-hole catheter was back loaded off the wire,  and the 6 x 24 soft flexed double-J stent was placed over the guidewire to  the right renal pelvis  under fluoroscopic guidance.  The wires were removed.  Excellent proximal  and distal curl obtained.  The patient's bladder was drained.  She was  reversed from her anesthesia which she tolerated without complication.  Please note Dr. Vernia Buff was present and participated in all aspects of this  case.  ______________________________  Glade Nurse, MD      Lindaann Slough, M.D.  Electronically Signed    MT/MEDQ  D:  09/08/2005  T:  09/08/2005  Job:  [id number]

## 2011-04-28 NOTE — Cardiovascular Report (Signed)
[name], GUGEL               ACCOUNT NO.:  [id number]   MEDICAL RECORD NO.:  [id number]          PATIENT TYPE:  INP   LOCATION:  2002                         FACILITY:  [facility]   PHYSICIAN:  [name], M.D.  DATE OF BIRTH:  10-Jul-1956   DATE OF PROCEDURE:  DATE OF DISCHARGE:                            CARDIAC CATHETERIZATION   DATE OF PROCEDURE:  November 08, 2006.   PROCEDURE DONE BY:  Dr. Orpah Cobb.   HOSPITAL LOCATION:  2002.   PROCEDURE:  Left heart catheterization, selective coronary angiography,  left ventricular function study.   INDICATION:  This 55 year old white female with recurrent exertional  dyspnea has cardiac risk factors of diabetes, hypertension, obesity,  hyperlipidemia.   APPROACH:  Right femoral artery using 4 French catheters and sheath.   COMPLICATIONS:  None.   MEDICATION USED:  Diltiazem 15 mg IV push, 2.5 mg of metoprolol, 25 mcg  of fentanyl, and 1 mg of midazolam.   HEMODYNAMIC DATA:  The left ventricular pressure was 134/26 and aortic  pressure was 133/65.   CORONARY ANATOMY:  The left main coronary artery was unremarkable.   Left anterior descending coronary artery:  The left anterior descending  coronary had proximal luminal irregularities.  Mid and distal segments  were unremarkable.  Diagonal vessel was unremarkable.   Left circumflex coronary artery:  The left circumflex coronary artery  had proximal eccentric 30% narrowing following obtuse marginal branch  origin.  It supplied collateral to the proximal and distal right  coronary artery by 2 separate collaterals.  Obtuse marginal branch 1 had minimal proximal disease.  Obtuse marginal branch 2 had near ostium proximal 50% long  concentriculation.  The rest of the vessel was unremarkable.   Right coronary artery:  The right coronary artery was dominant and had  proximal 50% narrowing tapering to 100% lesion followed by a long 100%  occlusion of the mid vessel extending  up to the distal vessel.  The  posterior descending coronary artery and posterolateral branches were  filling via collaterals from left circumflex coronary artery, and there  was diffuse narrowing of the posterior descending coronary artery.   Left ventriculogram:  The left ventriculogram showed mild inferior wall  hypokinesia with ejection fraction of 50%.   IMPRESSION:  1. Mild to moderate left circumflex coronary artery disease.  2. Severe right coronary artery disease.  3. Mild left ventricular systolic dysfunction.   RECOMMENDATIONS:  This patient will be treated medically with increasing  Lipitor dose to 40-80 mg as tolerated, adding ACE inhibitor to her  current therapy, and dietary consult, and more emphasis on therapeutic  lifestyle modification.      [name], M.D.  Electronically Signed     ASK/MEDQ  D:  11/08/2006  T:  11/09/2006  Job:  [id number]

## 2011-04-28 NOTE — Consult Note (Signed)
[name], [name]               ACCOUNT NO.:  [id number]   MEDICAL RECORD NO.:  [id number]          PATIENT TYPE:  OUT   LOCATION:  GYN                          FACILITY:  [facility]   PHYSICIAN:  John T. Kyla Balzarine, M.D.    DATE OF BIRTH:  [birth year]   DATE OF CONSULTATION:  10/23/2006  DATE OF DISCHARGE:                                 CONSULTATION   CHIEF COMPLAINT:  Follow-up after completion vaginal trachelectomy for  endometrial cancer.   HISTORY:  The patient had supracervical history for adenomatous  hyperplasia, which was performed because of morbid obesity.  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  She had a completion transvaginal trachelectomy on September 25, 2006.  Since that time, she had spotting after walking in the mall.  Bowel and bladder functions have normalized.  She denies fever or chills  or problems with bowel or bladder function beyond her baseline.  Because  of relative inactivity, she has continued to gain weight.   PHYSICAL EXAMINATION:  VITAL SIGNS:  Weight 333 pounds and vital signs  stable.  GENERAL:  The patient is anxious and alert.  BACK:  There is no back or CVA tenderness.  ABDOMEN:  Obese, soft and benign with no ascites, mass or tenderness.  EXTREMITIES:  Full strength and range of motion.  PELVIC:  External genitalia and BUS are normal to inspection and  palpation.  Bladder and urethra are normal.  Vaginal mucosa has no  lesions.  Suture line at the cuff is well-healed with residual  induration.   ASSESSMENT:  Endometrial cancer post completion trachelectomy.   PLAN:  Because final pathology in the cervix revealed no evidence of  disease, we would recommend no further follow-up.  We can alternate  follow-up with Dr. Senaida Ores.      John T. Kyla Balzarine, M.D.  Electronically Signed     JTS/MEDQ  D:  10/23/2006  T:  10/24/2006  Job:   [id number]   cc:   Huel Cote, M.D.  Fax: [phone]   [name], M.D.  Fax: [phone]   Lindaann Slough, M.D.  Fax: [phone]   Roseanna Rainbow, M.D.  Fax: [phone]   Telford Nab, R.N.  [address]  [place], [place] [address]

## 2011-04-28 NOTE — Consult Note (Signed)
[name], [name]               ACCOUNT NO.:  [id number]   MEDICAL RECORD NO.:  [id number]          PATIENT TYPE:  EMS   LOCATION:  MAJO                         FACILITY:  [facility]   PHYSICIAN:  Lindaann Slough, M.D.  DATE OF BIRTH:  Feb 20, 1956   DATE OF CONSULTATION:  09/07/2005  DATE OF DISCHARGE:                                   CONSULTATION   REASON FOR CONSULTATION:  Right flank pain.   The patient is a 55 year old female who had been complaining of right flank  pain on and off for the past year.  She has had episodes of flank pain every  2 or 3 months.  However, a week ago she had severe right flank pain  associated with nausea and vomiting.  She went to see Dr. Algie Coffer, who  requested a renal ultrasound that showed a right hydronephrosis.  She was  then sent to the emergency room for further evaluation.  A CT scan of the  abdomen and pelvis showed severe right hydronephrosis and a 2x1 cm stone in  the renal pelvis, and a 7.7 mm stone in the right upper ureter with severe  hydronephrosis.  I was then asked to come in and see the patient for further  treatment.   PAST MEDICAL HISTORY:  1.  Hypertension.  2.  Hypothyroidism.   MEDICATIONS:  Propranolol; Norvasc; Zoloft; Zyrtec; Vicodin; Augmentin.   ALLERGIES:  1.  SEPTRA.  2.  ASPIRIN.  3.  NSAID.  4.  ERYTHROMYCIN.   SOCIAL HISTORY:  She is married.  Has two children.  Doesn't smoke or drink.   FAMILY HISTORY:  Her father died of leukemia at the age of 46.  Her mother  died in a lawnmower accident.  She had bipolar disorder.   PAST SURGICAL HISTORY:  1.  She had lumbar laminectomy x2.  2.  Right rotator cuff surgery.   REVIEW OF SYSTEMS:  She complains of nausea, vomiting, abdominal pain, and  all others are negative.   PHYSICAL EXAMINATION:  GENERAL:  This is a moderately obese 55 year old  female complaining of right flank pain.  She is oriented to time, place, and  person.  VITAL SIGNS:  Blood pressure  169/84; pulse 83; respirations 20; temperature  97.5.  HEENT:  Her head is normal.  Pupils are equal and reactive to light.  She  has pink conjunctivae.  NECK:  Supple.  She has no cervical adenopathy, no thyromegaly.  LUNGS:  Clear to percussion and auscultation.  HEART:  Regular rhythm.  ABDOMEN:  Soft, protuberant.  Tender in the right flank.  She has right CVA  tenderness.  Kidneys are not palpable.  She has no hepatomegaly, no  splenomegaly.  Bladder is not distended.  She has no umbilical or inguinal  hernia.  PELVIC:  Meatus is normal.  She has no caruncle, no cystocele.  There is no  tenderness in the bladder area.  There is no adnexal mass.  The cervix is  firm, in the midline, nontender.  RECTAL:  Sphincter tone is normal.  She has no external hemorrhoids.  The  rectal mucosa is smooth.  SKIN:  Warm and dry.   Urinalysis shows 21-50 RBCs and 0-2 WBCs.   I independently reviewed the CT scan, and it showed a large stone in the  renal pelvis and 2 smaller stones in the kidney, and a 7 mm stone at the  right UPJ, with hydronephrosis.   IMPRESSION:  Right renal calculi and right upper ureteral calculus.   PLAN:  I discussed with the patient regarding insertion of double-J catheter  to relieve any obstruction.  Plan was to do the procedure this evening.  However, she had eaten right before I saw her in the emergency room, and I  discussed this with the anesthesiologist, and he feels that it would not be  prudent to proceed with general anesthesia at this time, and it would be  best to wait until midnight to do the procedure.  The patient feels that she  can go home, and she can return to the hospital in the morning for the  procedure.      Lindaann Slough, M.D.  Electronically Signed     MN/MEDQ  D:  09/08/2005  T:  09/08/2005  Job:  [id number]

## 2011-04-28 NOTE — Op Note (Signed)
[name], [name]               ACCOUNT NO.:  [id number]   MEDICAL RECORD NO.:  [id number]          PATIENT TYPE:  INP   LOCATION:  9320                          FACILITY:  [facility]   PHYSICIAN:  Huel Cote, M.D. DATE OF BIRTH:  Apr 21, 1956   DATE OF PROCEDURE:  05/08/2006  DATE OF DISCHARGE:                                 OPERATIVE REPORT   PREOPERATIVE DIAGNOSES:  1.  Complex hyperplasia with atypia.  2.  Abnormal uterine bleeding.  3.  Morbid obesity.   POSTOPERATIVE DIAGNOSES:  1.  Complex hyperplasia with atypia.  2.  Abnormal uterine bleeding.  3.  Morbid obesity.  No carcinoma identified on a frozen specimen.   PROCEDURE:  Supracervical total abdominal hysterectomy, bilateral salpingo-  oophorectomy.   SURGEON:  Dr. Huel Cote.   ASSISTANT:  Malachi Pro. Ambrose Mantle, M.D.   ANESTHESIA:  General.   SPECIMENS:  Uterus, tubes and ovaries were sent minus the cervix.   ESTIMATED BLOOD LOSS:  Was 1250 mL.   IV FLUIDS:  3800 mL LR.   URINE OUTPUT:  600 mL clear urine.   COMPLICATIONS:  Only greater than expected blood loss secondary to extremely  difficult visualization in the pelvis.  The uterus was small with ovaries  apparently normal.  The dermoid which was possibly noted on ultrasound could  not be obviously identified grossly.  The decision to remove the ovaries was  made secondary to possibility of this pathology and the extremely difficult  nature of surgery.   PROCEDURE:  The patient was taken to operating room where general anesthesia  was obtained without difficulty.  She was then prepped and draped in normal  sterile fashion in dorsal supine position with a Foley catheter in place.  A  Pfannenstiel skin incision  was made and carried through to the underlying  layer of fascia with scalpel and Bovie cautery.  Fascia was then opened in  the midline and the incision extended laterally with Mayo scissors.  Peritoneal cavity was entered bluntly and at this  point the [facility] self-  retaining large wound retractor was placed within the incision.  The patient  had a very redundant bowel which completely filled the pelvis and could not  be adequately packed away from the pelvis with the wound retractor placed  and in steep Trendelenburg.  Therefore the wound retractor was abandoned and  the Balfour retractor placed within the incision.  With the bladder blade in  place and the Balfour extender in place to help with bowel retraction, the  pelvis was isolated as could best be performed.  Pelvic washings were  obtained and held until final pathology.  The uterus was noted to be  slightly immobile and down in the pelvis with pretty poor visualization.  The bowel once again was quite redundant and required malleable retractors  placed within to even visualize the uterus itself.  Attention was then  turned to the patient's round ligament which was isolated, clamped with  Zeppelin clamp, transected and suture ligated and then with 0 Vicryl pop-  off.  This was held in a hemostat. The  infundibulopelvic ligament was then  isolated after the ovary had been carefully inspected and clamped with  Zeppelin clamps.  This was then, transected and suture ligated with 0 Vicryl  as well.  The ovary and tube were then retracted medially with Babcock clamp  and what could be seen of the broad ligament was skeletonized down.  Attention was then turned to the patient's left where the round ligament was  likewise taken down with a Zeppelin clamp, suture ligated and transected.  The infundibulopelvic ligament was then isolated, clamped with a Zeppelin  clamp, suture ligated and transected with 0 Vicryl as well, slightly more  mobile on the patient's left.  Therefore that area was skeletonized and  attempts were made to isolate the uterine arteries, the pelvis was extremely  deep and was requiring very long instruments and retractors with poor  visualization still noted.   In several bites the broad ligament was taken  down with Zeppelin clamps and suture ligated with 0 Vicryl at each pedicle.  It was difficult to control the bleeding at these pedicles as it was  difficult to adequately suture ligate them given the depth they were in the  pelvis.  Attempts were made to minimize blood loss by clamping any frank  bleeders and eventually reaching the uterine arteries.  These were  crossclamped, transected and suture ligated with 0 Vicryl.  Attention was  then returned to the right where similarly, the broad ligament was taken  down as could best be performed and bites with Zeppelin clamps.  Each step  suture ligated with 0 Vicryl.  This was taken down to the level of the  uterine arteries which were crossclamped with Zeppelin clamps, transected  and suture ligated.  Despite efforts to suture ligate each pedicle  adequately, the patient did have steady bleeding low in the pelvis which  required additional suture ligatures, which were extremely difficult to  place secondary to the depth and poor visualization, however with persistent  effort the bleeding was well-controlled with 0 Vicryl suture ligatures.  One  on the patient's left had to be placed somewhat more laterally than normal  secondary to heavy bleeding.  This did control bleeding well.  The ureters  were palpated after surgery and were not felt to be compromised by any of  these suture ligatures.  At this point the uterus was taken down and  paracervical tissues down to the level of the uterosacral ligament.  It was  felt that any further attempts to remove the cervix was likely result in  heavier bleeding and would be difficult to control as deep in the pelvis as  the vaginal cuff would prove to be.  At this point the decision was made to  proceed with supracervical hysterectomy and only proceed with cervical removal if the pathology warranted this.  The cervix was then cut across  with Mayo scissors  just above the uterosacral ligaments and once completely  amputated the cervical stump was grasped with Kocher clamps.  This was then  handed off for pathology and attention turned to the cervical stump which  was bleeding somewhat laterally.  This was then suture ligated with figure-  of-eight sutures of zero Vicryl in multiple sutures all the way across the  cuff with adequate control of bleeding obtained.  One additional area of  bleeding and in the peritoneal area posteriorly was controlled with a 3-0  Vicryl on an SH needle as a figure-of-eight suture.  Any small areas  of  bleeding were controlled with Bovie cautery as could best be visualized.  This point the abdomen and pelvis were irrigated.  It was noted that blood  loss was approximately 1200 mL and the expectation was hopefully that no  further blood loss with ensue.  The patient remained very stable with no  problems with urine output or her vital signs.  The infundibulopelvic  ligaments were inspected bilaterally and found to be hemostatic.  The round  ligaments were also hemostatic.  The cervical stump itself had been oversewn  and appeared hemostatic.  Therefore all sutures were trimmed and all  instruments removed from the patient's abdomen and pelvis.  The rectum and  colon were briefly inspected, given the degree of retraction was required  upon them to visualize any of the pelvis and appeared to be not traumatized.  Again the right ureter was clearly palpated quite deep in the pelvis and  appeared normal in caliber.  It was not possible to fully palpate the left  ureter; however at an identical level of the be well below any sutures.  All  laparotomy sponges were then removed and counted carefully and counts were  correct.  Therefore the rectus muscles were reapproximated after the Balfour  retractor removed with 0 Vicryl, several interrupted sutures.  The fascia  was closed with 0 double looped PDS for added support given  the patient's  obesity and diabetic status.  The subcutaneous tissue was reapproximated  with a running suture of 3-0 Vicryl and the skin was closed with staples.  Sponge, lap and needle counts were correct x2 and the patient was carefully  and slowly extubated given a history with difficult extubation and taken to  the recovery room in stable condition.      Huel Cote, M.D.  Electronically Signed     KR/MEDQ  D:  05/08/2006  T:  05/08/2006  Job:  [id number]

## 2011-04-28 NOTE — Op Note (Signed)
[name], [name]               ACCOUNT NO.:  [id number]   MEDICAL RECORD NO.:  [id number]          PATIENT TYPE:  INP   LOCATION:  0098                         [facility]AC[facility]   PHYSICIAN:  John T. Kyla Balzarine, M.D.    DATE OF BIRTH:  1956/01/08   DATE OF PROCEDURE:  09/25/2006  DATE OF DISCHARGE:                                 OPERATIVE REPORT   PREOPERATIVE DIAGNOSIS:  Endometrial carcinoma status post supracervical  hysterectomy.   POSTOPERATIVE DIAGNOSIS:  Endometrial carcinoma status post supracervical  hysterectomy.   PROCEDURE:  Transvaginal cervicectomy.   ANESTHESIA:  General endotracheal with local injection and 0.25% Marcaine  with epinephrine.   FINDINGS AND INDICATIONS FOR SURGERY:  This 55 year old morbidly obese woman  underwent a hysterectomy for adenomatous hyperplasia.  Because of morbid  obesity, she had a supracervical hysterectomy performed.  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  BSO was performed at the time of her primary surgery.  Unfortunately, the patient has multiple comorbidities including diabetes and  has continued to gain weight since her surgery.  We elected on cervicectomy  as definitive treatment.   Examination under anesthesia revealed a small cervix sounding to 2 cm.  No  adnexal masses were appreciated, limited by habitus.   DESCRIPTION OF PROCEDURE:  The patient was prepped and draped in the low  lithotomy position using direct placement stirrups while she was awake and  after induction of anesthesia, was converted to a high lithotomy position.  Routine prep with Betadine and was performed, insertion of Foley catheter.  Examination under anesthesia revealed the findings described above. The  cervix was grasped with a tenaculum and sounded to 2 cm.  The cervical  stroma was injected circumferentially with 10 mL of Marcaine plus  epinephrine.  The anterior and  posterior vaginal mucosa was incised with the  scalpel and because of the small cervical stump remnant, it was elected to  remove the cervix as a large conization.  The bladder flap was mobilized  anteriorly and posteriorly, the bladder was dissected away from the  posterior cervix.  Using sharp dissection and electrocautery, the central  cervical stump was removed extending up under the bladder flap.  During the  course of the procedure, the specimen fragmented into anterior and posterior  specimens which were submitted separately.  After excising the specimen from  the apex, the vaginal cuff was hemostatically controlled with a locked  running suture line of #0 Vicryl, leaving the central portion open.  The  cervical stroma __________ was treated with electrode desiccation.  Hemostasis was adequate.  All retractors and the Foley catheter were removed  and the patient was returned to recovery in stable condition following  reversal of anesthesia.      John T. Kyla Balzarine, M.D.  Electronically Signed     JTS/MEDQ  D:  09/25/2006  T:  09/26/2006  Job:  [id number]   cc:   Roseanna Rainbow, M.D.  Fax: [phone]   Telford Nab, R.N.  [address]  [place], [place] [address]  Huel Cote, M.D.  Fax: [phone]   [name], M.D.  Fax: [phone]   Lindaann Slough, M.D.  Fax: [phone]

## 2011-04-28 NOTE — Discharge Summary (Signed)
[name], [name]               ACCOUNT NO.:  [id number]   MEDICAL RECORD NO.:  [id number]          PATIENT TYPE:  INP   LOCATION:  2002                         FACILITY:  [facility]   PHYSICIAN:  [name], M.D.  DATE OF BIRTH:  [birth year]   DATE OF ADMISSION:  11/07/2006  DATE OF DISCHARGE:  11/09/2006                               DISCHARGE SUMMARY   PRINCIPAL DIAGNOSIS:  1. Congestive heart failure.  2. Multivessel native-vessel coronary artery disease.  3. Bilateral leg edema.  4. Multifocal atrial tachycardia, paroxysmal.  5. Obesity.  6. Diabetes mellitus type 2.  7. Hypertension.  8. Status post renal stone.  9. Anxiety.  10.Hyperlipidemia.  11.Hypothyroidism.   PRINCIPAL PROCEDURE:  Left heart catheterization done by Dr. Orpah Cobb on November 08, 2006.   DISCHARGE MEDICATIONS:  1. Altace 2.5 mg 1 daily in the morning.  2. Coreg 40 mg 1 daily in the morning.  3. Cardizem 350 mg extended release 1 daily at noontime.  4. Cozaar 50 mg 1 daily in the morning.  5. Synthroid 75 mcg 1 daily.  6. Hydrocodone APAP 5/500 twice daily as needed.  7. Zyrtec 10 mg 1 daily.  8. Glucotrol XL 5 mg daily.  9. Lantus 25-40 units at bedtime as directed.  10.Zoloft 100 mg 1 daily.  11.Vitamin C 500 mg 1 daily.  12.Potassium chloride 20 mEq 1 twice daily.  13.Potassium citrate 20 mEq 3 times daily.  14.Plavix 75 mg 1 daily in the morning.  15.Lipitor 80 mg 1 daily in the evening.  16.Lorazepam 1 mg twice daily as needed.  17.Lasix 40 mg 1 twice daily.  18.Ferrous sulfate 325 mg 1 twice daily.  19.Patient to discontinue Inderal, Caduet, Avandamet, and Metformin.   SPECIAL INSTRUCTIONS:  Patient to follow heart failure booklet and  follow stronger heart booklet.  Get a CBC, BMP blood work done in 1  week.   FOLLOWUP:  By Dr. Orpah Cobb in 2 weeks.  Patient to call [phone] for  appointment.   DISCHARGE DIET:  Low-fat, low-salt, 1500-calorie diabetic diet and 1200  mL  fluid restriction per day.   HISTORY:  This 55 year old white female presented with exertional  dyspnea and leg edema x1 month.  Patient admitted to some dietary  noncompliance and in spite of taking insulin, continued to have  recurrent symptoms with cardiac risk factors of hypertension,  hyperlipidemia, diabetes, and obesity.   PHYSICAL EXAMINATION:  Pulse 86, respiration 18, blood pressure 130/80.  Is 5 feet 5 inches in height and 319 pounds in weight.  Patient was  alert, oriented x3.  Temperature 98.3, oxygen saturation 93% on room  air.  HEENT:  Patient is normocephalic, atraumatic with hazel eyes, pupils  equal and reactive to light, and extraocular movement intact.  Patient  wears glasses and she is edentulous.  NECK:  No JVD.  No carotid bruits.  Full range of motion of the neck.  LUNGS:  Clear but decreased air entry at the bases.  HEART:  Normal S1, S2 without S3 gallop.  ABDOMEN:  Distended but non tender.  EXTREMITIES:  2+ edema and decreased abduction to 90 degrees on both  shoulders.  CNS:  Grossly intact cranial nerves, and patient moves all 4  extremities.   LABORATORY DATA:  Normal hemoglobin, hematocrit, WBC count, platelet  count, normal electrolytes, BUN, creatinine, INR 1.1, albumin 3.4, b-  natriuretic peptide was 70.  Chest x-ray improved from previous exam for  pulmonary edema and no active process.  Cardiac catheterization showed  mild to moderate left circumflex coronary artery disease and severe  right coronary artery disease, appeared to be chronic in nature, and  mild left systolic dysfunction.  Echocardiogram showed mild inferior  wall hypokinesia and mild mitral regurgitation with a dilated left  atrium.   HOSPITAL COURSE:  Patient was admitted to telemetry unit, myocardial  infarction was ruled out.  She underwent cardiac catheterization that  showed mild to moderate left circumflex coronary artery disease, and  severe right coronary artery  disease.  Due to chronic nature of the  disease and a long segment of right coronary artery missing, it was  decided to continue medical therapy.  She had 2 sets of collaterals  supplying the right side of the myocardium.  She responded very well to  IV Lasix.  She had multifocal atrial tachyarrhythmia off and on that  responded well to Cardizem and Coreg use.   She was discharged home on November 09, 2006, in satisfactory condition  with followup by me in 2 weeks.      [name], M.D.  Electronically Signed     ASK/MEDQ  D:  11/09/2006  T:  11/09/2006  Job:  [id number]

## 2011-04-28 NOTE — Procedures (Signed)
[name], [name]               ACCOUNT NO.:  [id number]   MEDICAL RECORD NO.:  [id number]          PATIENT TYPE:  OUT   LOCATION:  SLEEP CENTER                 FACILITY:  [facility]   PHYSICIAN:  Coralyn Helling, M.D.      DATE OF BIRTH:  31-Jul-1956   DATE OF STUDY:  01/31/2006                              NOCTURNAL POLYSOMNOGRAM   REFERRING PHYSICIAN:  Dr. Coralyn Helling   INDICATION FOR STUDY:  This is an individual who had undergone an overnight  polysomnogram on December 16, 1999, which showed a respiratory disturbance  index of 15 with oxygen saturation nadir of 62%. Since that time she has had  increased weight and continued symptoms of excessive daytime sleepiness with  sleep disturbance and she is referred to the sleep laboratory for a split-  night study.   MEDICATIONS:  Propranolol, Levoxyl, Norvasc, Prevacid, Zoloft, Zyrtec,  Vicodin, Cipro, sodium bicarbonate, Ritalin, and Compazine.   EPWORTH SLEEPINESS SCORE:  21   SLEEP ARCHITECTURE:  Pre CPAP:  Total recording time was 203.3 minutes.  Total sleep time was 95.5 minutes. Sleep efficiency was 46%. This portion of  the study was notable for the lack of slow wave sleep as well as REM sleep.  The patient slept predominantly in the supine position. Sleep latency was 7  minutes and there was no REM sleep noted. Post CPAP:  Total recording time  was 209.87 minutes. Total sleep time was 156 minutes. Sleep efficiency was  74%. The patient slept in both the supine and nonsupine position. No slow  wave sleep was noted and a minimal amount of REM sleep was noted.   RESPIRATORY DATA:  Pre CPAP:  The apnea/hypopnea index 33.3 and loud snoring  was noted by the technician. Post CPAP:  The patient was titrated from  initially CPAP of 4 to 15 cmH2O and then changed to BiPAP and titrated from  BiPAP of 15/13 to 17/15 due to frequent central events as well as continued  oxygen desaturation. At a BiPAP pressure setting of 17/15 snoring was  eliminated, oxygenation had stabilized, and the apnea/hypopnea index was  reduced to 15.1 and 13 minutes of REM sleep was noted at this pressure  setting.   OXYGEN DATA:  The oxygen saturation nadir pre CPAP was 78%. The patient  spent a total of 71 minutes with an oxygen saturation between 91% and 100%,  a total of 253 minutes with an oxygen saturation between 81% and 90%, and a  total of 3.6 minutes with an oxygen saturation between 71% and 80%.   CARDIAC DATA:  EKG showed normal sinus rhythm.   MOVEMENT/PARASOMNIA:  The periodic limb movement index was 8.8.   IMPRESSIONS/RECOMMENDATIONS:  This was a split night study. During the  diagnostic portion of the study the apnea/hypopnea index was 33.3 with an  oxygen saturation nadir of 70% which would be consistent with severe  obstructive sleep apnea. During the therapeutic portion the patient was  titrated up to a BiPAP pressure setting of 17/15. At this pressure setting  she slept in the supine position and was observed in REM sleep. Her  oxygenation improved, although it still  remained low at times, and her  apnea/hypopnea index was reduced at 15.1 and snoring was eliminated. At this  point the patient should be started on BiPAP at 17/15, monitored for her  clinical response, and then consideration should be made for overnight  oximetry to determine if she would require supplemental oxygen. If she  remains symptomatic in spite of being on BiPAP at these pressure settings,  further consideration could be made for a full night titration study versus  an at-home BiPAP autotitration study.      Coralyn Helling, M.D.  Diplomat, Biomedical engineer of Sleep Medicine  Electronically Signed     VS/MEDQ  D:  02/13/2006 11:50:34  T:  02/14/2006 11:57:18  Job:  [id number]

## 2011-04-28 NOTE — Op Note (Signed)
[place]  Patient:    [name], [name]                      MRN: [id number] Proc. Date: 10/24/00 Adm. Date:  [id number] Attending:  Colbert Ewing                           Operative Report  PREOPERATIVE DIAGNOSES: 1. Complete chronic attritional rotator cuff tear, right shoulder. 2. Chronic impingement with degenerative joint disease, acromioclavicular    joint.  POSTOPERATIVE DIAGNOSES: 1. Complete chronic attritional rotator cuff tear, right shoulder. 2. Chronic impingement with degenerative joint disease, acromioclavicular    joint.  PROCEDURE: 1. Right shoulder examination under anesthesia, arthroscopy with debridement    of rotator cuff. 2. Arthroscopic acromioplasty. 3. Excision distal clavicle. 4. Open repair rotator cuff tear, Concept repair system.  Sutures tied through    bony tunnels.  SURGEON:  Loreta Ave, M.D.  ASSISTANT:  Arlys John D. Petrarca, P.A.-C.  ANESTHESIA:  General.  ESTIMATED BLOOD LOSS:  Minimal.  SPECIMENS:  None.  CULTURES:  None.  COMPLICATIONS:  None.  DRESSING:  Soft compressive with shoulder immobilizer.  DESCRIPTION OF PROCEDURE:  Patient brought to the operating room and placed on the operating table in the supine position.  After adequate anesthesia had been obtained, right shoulder examined.  Full motion and good stability. Placed in beach chair position on the shoulder positioner and prepped and draped in the usual sterile fashion.  Three standard arthroscopic portals, anterior, posterior, and lateral.  Shoulder entered with blunt obturator, distended, and inspected.  Articular cartilage, labrum, biceps all looked good.  Rotator cuff was torn over the anterior half of the supraspinatus tendon with a thin veil of tissue on the undersurface, paper-thin, with a functional full-thickness tear, attritional in nature but with reasonable tissue quality and minimal retraction.  Cannula  redirected subacromially. Some recurrent impingement from an acromioplasty five years ago.  There were recurrent spurs anterolaterally where it converted to a type 1 acromion to complete decompression.  Lateral centimeter of clavicle exposed with marked grade 3 and 4 changes.  Periarticular spurs contributing to impingement there as well.  Lateral centimeter sharply resected with shaver and high-speed bur. Adequacy of decompression confirmed viewing from all ports.  Instruments and fluid removed.  Lateral portal turned into a deltoid-splitting incision.  Cuff exposed.  Debrided back to healthy tissue.  Well-captured with two nonabsorbable #2 Arthrex sutures.  Prominence of tuberosity contoured with a bur.  A trough made in the bone just off the articular cartilage surface.  The Concept repair system was used to make bony tunnels, and the sutures were woven through bony tunnels and then firmly tied over bone, giving a nice, firm, watertight closure over cuff.  I could bring her arm through full motion without tension on the repair.  Adequacy of decompression confirmed digitally at the time of cuff repair.  The wound was copiously irrigated.  Deltoid closed with 0 Vicryl, skin and subcutaneous tissue with Vicryl.  Portals were closed with nylon.  Margins of the wound were all injected with Marcaine.  A sterile compressive dressing with a sling applied.  Anesthesia reversed, brought to recovery room.  Tolerated the surgery well with no complications. DD:  10/24/00 TD:  10/24/00 Job: [id number]

## 2011-04-28 NOTE — Discharge Summary (Signed)
[name], [name]               ACCOUNT NO.:  [id number]   MEDICAL RECORD NO.:  [id number]          PATIENT TYPE:  INP   LOCATION:  1428                         FACILITY:  [facility]   PHYSICIAN:  Lindaann Slough, M.D.  DATE OF BIRTH:  1955/12/28   DATE OF ADMISSION:  10/09/2005  DATE OF DISCHARGE:  10/12/2005                                 DISCHARGE SUMMARY   DISCHARGE DIAGNOSIS:  Right renal calculi.   PROCEDURE DONE:  Right percutaneous nephrostomy and right percutaneous  nephrolithotomy on October 09, 2005.   The patient is a 55 year old female who was seen in the emergency room on  September 07, 2005, with severe right flank pain. CT scan of the abdomen and  pelvis showed a 2 cm stone in the renal pelvis and another 8 mm stone in the  right upper ureter with hydronephrosis. A double J catheter was inserted  because of the hydronephrosis and the patient was admitted on October 09, 2005, for percutaneous nephrolithotomy.   On physical examination, blood pressure 148/88, pulse 80, respirations 18,  temperature 96.8. Lungs clear to percussion and auscultation. Heart regular  rhythm. Abdomen protuberant, tender in the right flank, and right CVA  tenderness, and kidneys not palpable. Bowel sounds normal.   Hemoglobin on admission 12.9, hematocrit 41, wbc's 7.7. BUN 5, creatinine  0.8, sodium 140, potassium 5.0, glucose 113. Urinalysis showed 0-2 wbc's; 0-  2 rbc's; and 50,000 colonies of multiple bacteria. Chest x-ray showed no  evidence of active disease but chronic changes. EKG showed nonspecific T-  wave abnormalities.   The patient had a right percutaneous nephrostomy and right percutaneous  nephrolithotomy on October 09, 2005. Postoperative course was uneventful.  She remained afebrile. She tolerated her diet well. Right nephrostogram  showed no large stone fragments and some small filling defect in the lower  pole of the kidney that could be blood clots or small stone  fragments. The  nephrostomy catheter was clamped and she did not have any pain. The  nephrostomy catheter was irrigated and some small blood clots were irrigated  out, and the nephrostomy catheter was removed.   She was then discharged home on:  1.  Cipro 250 mg twice a day.  2.  Percocet 5/325 one or two tablets q.4h. p.r.n. for pain.  3.  Prevacid 30 mg daily h.s.  4.  Zoloft 100 mg daily h.s.  5.  Zyrtec 10 mg daily.  6.  Levoxyl 75 mcg daily.  7.  Propranolol 40 mg to take one-half tablet twice a day.  8.  Norvasc 10 mg daily.   CONDITION ON DISCHARGE:  Improved.   DISCHARGE DIET:  Regular.   The patient is instructed not to do any lifting, straining, or driving until  further advised. She will be followed in the office in 2 weeks.      Lindaann Slough, M.D.  Electronically Signed     MN/MEDQ  D:  10/12/2005  T:  10/12/2005  Job:  [id number]   cc:   [name], M.D.  Fax: [phone]

## 2011-04-28 NOTE — H&P (Signed)
[name], [name]               ACCOUNT NO.:  [id number]   MEDICAL RECORD NO.:  [id number]          PATIENT TYPE:  AMB   LOCATION:  DAY                          FACILITY:  [facility]   PHYSICIAN:  Lindaann Slough, M.D.  DATE OF BIRTH:  1956/07/13   DATE OF ADMISSION:  10/09/2005  DATE OF DISCHARGE:                                HISTORY & PHYSICAL   CHIEF COMPLAINT:  Right kidney stone.   HISTORY OF PRESENT ILLNESS:  The patient is a 55 year old female who was  seen in the emergency room on September 07, 2005 with right flank pain.  CT  scan of the abdomen and pelvis showed a 2 cm stone in the renal pelvis and a  8 mm stone in the right upper ureter with hydronephrosis.  She had a right  double-J catheter inserted.  Treatment options were discussed with the  patient EF versus PCNL.  Because of the size of the stone she was advised to  have PCNL and she is admitted today for the procedure.   PAST MEDICAL HISTORY:  Hypertension.  Hypothyroidism.   PAST SURGICAL HISTORY:  She had lumbar laminectomy done twice and right  rotator cuff surgery.   CURRENT MEDICATIONS:  Propranolol.  Norvasc.  Zoloft.  Zyrtec.  Vicodin.  Augmentin.   ALLERGIES:  SEPTRA.  ASPIRIN.  NSAID'S.  ERYTHROMYCIN.   SOCIAL HISTORY:  She is married.  Has two children.  Does not smoke.  No  drinks.   FAMILY HISTORY:  Her father died of leukemia at age 47.  Her mother died in  a lawn mower accident and she had bipolar disorder.   REVIEW OF SYSTEMS:  She has no cough, no shortness of breath, no hemoptysis  and she has right flank pain at this time secondary to percutaneous  nephrostomy done this morning and all others are negative.   PHYSICAL EXAMINATION:  GENERAL:  This is a well-built 55 year old female in  moderate distress due to pain.  VITAL SIGNS:  Blood pressure 148/88, pulse 80, respirations 18, temperature  96.8.  HEENT:  Her head is normal.  Pupils are equal and reactive to light.  She  has pink  conjunctivae.  NECK:  Supple.  She has no cervical adenopathy, no thyromegaly.  LUNGS:  Clear to palpation and auscultation.  HEART:  Regular rhythm.  No murmurs, no gallops.  ABDOMEN:  Soft, protuberant with mild tenderness in the right flank and she  has right CVA tenderness.  Kidneys are not palpable.  She has no  hepatomegaly and no splenomegaly.  Bladder is not distended.  She has no  umbilical or inguinal hernia.  Bowel sounds are normal.  PELVIC:  The meatus is normal.  She has no caruncle.  No cystocele.  There  is no tenderness in the bladder area.  There is no adnexal mass.  The cervix is firm in the midline.  Nontender.  RECTAL:  Sphincter tone is normal and rectal mucosa is smooth.  There are no  rectal masses.   IMPRESSION:  Right renal calculi, hypertension and hypothyroidism.      Marc-Henry  Nesi, M.D.  Electronically Signed     MN/MEDQ  D:  10/09/2005  T:  10/09/2005  Job:  [id number]

## 2011-04-28 NOTE — Consult Note (Signed)
[name], [name]               ACCOUNT NO.:  [id number]   MEDICAL RECORD NO.:  [id number]          PATIENT TYPE:  OUT   LOCATION:  GYN                          FACILITY:  [facility]   PHYSICIAN:  John T. Kyla Balzarine, M.D.    DATE OF BIRTH:  10/07/1956   DATE OF CONSULTATION:  06/27/2006  DATE OF DISCHARGE:                                   CONSULTATION   CHIEF COMPLAINT:  This 55 year old para 2-0-1-2 woman is seen at the request  of Dr. Huel Cote for recommendations regarding management of  endometrial cancer discovered at supracervical hysterectomy.   HISTORY OF PRESENT ILLNESS:  Patient has longstanding irregular abnormal  uterine bleeding with endometrial biopsy and ultrasound performed in  February 2007 revealing complex endometrial hyperplasia with focally atypia  and ultrasound showing a 14 mm endometrial stripe.  She was offered Jearld Adjutant  IUD but wished definitive surgery.  She was explored through a Pfannenstiel  incision on 05/08/2006 and a supracervical hysterectomy with bilateral  salpingo-oophorectomy was performed.  Because of poor visualization and  immobile cervix, and a frozen section compatible with hyperplasia, the  cervix was left in situ.  Final pathology unfortunately returned  demonstrating a well-differentiated noninvasive endometrial adenocarcinoma,  Figo grade 1, arising in a background of extensive complex atypical  hyperplasia.  The endocervical canal and margin was negative for invasive  disease as were tubes and ovaries.  The patient's postoperative course has  been complicated by a wound cellulitis treated with oral antibiotics and  anemia requiring oral iron.   Past medical history is complicated by multiple medical concerns including  morbid obesity, adult onset diabetes, chronic hypertension, sleep apnea,  mild depression, hypothyroidism, GERD and kidney stones.  She has extremely  poor dentition and is scheduled for extraction in late July 2007.  She  is  post surgical repair of ruptured disk x2 and has chronic back pain related  to a third disk.  She is post nephrostomy tube for nephrolithiasis, shoulder  surgery and remote tubal ligation.  She is status post remote T&A.  She is  status post NSVD x2 and SAB x1.   CURRENT MEDICATIONS:  Include Synthroid 75 mcg, furosemide 20 mg, potassium  supplementation, propranolol 40 mg daily, Cozaar 50 mg daily, Avandamet  4/500 twice daily, Zyrtec 10 mg daily, glipizide ER 5 mg daily,  methylphenidate 20 mg daily, __________ 10/20, Lantus insulin 18 units  daily.  Prevacid 30 mg daily, lorazepam 1 mg twice daily p.r.n., Zoloft 100  mg daily, sodium bicarbonate 650 several times daily, Vicodin 5/500 p.r.n.,  vitamin C and ferrous sulfate.   ALLERGIES:  Include HYPERSENSITIVITY WITH A CAPITAL NSAIDS IN GI UPSET WITH  SULFA DRUGS AND ERYTHROMYCIN, WITHOUT CLASSICAL ALLERGIES TO THOSE  MEDICATIONS.   PERSONAL SOCIAL HISTORY:  Negative.   FAMILY HISTORY:  Negative for gynecologic, breast or colon cancer.   REVIEW OF SYSTEMS:  Chronic pain syndrome related to back.  Recurrent  ophthalmologic and infections and gingivitis.  Frequent GERD.  Otherwise  negative in 10 systems.   EXAM:  Weight 302 pounds, blood pressure 152/88, vital  signs otherwise  stable and afebrile.  The patient is anxious, alert and oriented x3 in no  acute distress.  ENT:  Benign with clear oropharynx.  There is very poor dentition with  multiple carious and missing teeth.  Neck is supple without goiter.  No  scleral icterus.  LUNG:  Fields clear.  No JVD.  There is mild tenderness to percussion over  the lumbar spine.  ABDOMEN:  Obese, soft and benign with well-healed subumbilical laparoscopic  trocar incision and Pfannenstiel incision with minimal residual induration  but no erythema.  No tenderness.  EXTREMITIES:  Full strength and range of motion with no edema or stasis  changes.   PELVIC:  External genitalia and BUS  normal to inspection, palpation.  The  bladder and urethra are normal.  Speculum examination reveals very high  cervical remnant, visibly normal cervical portio.  Bimanual and rectovaginal  examinations are limited because of habitus.  It appears that she has a very  short cervix.   ASSESSMENT:  Apparent stage I a, grade 1 endometrioid adenocarcinoma.  Multiple medical comorbidities.  Pending extensive oral surgery.   PLAN AND RECOMMENDATIONS:  I had a long discussion with the patient  detailing options for management.  These will include simple surveillance  with endocervical brush at 62-month intervals for the first year and then at  2-month intervals and deferring any treatment unless she had endometrial  cells or AGUS cells on cytology, treatment with Megace (disadvantage mainly  increased appetite and weight gain), whole pelvic radiotherapy (disadvantage  related to chronic morbidity).  Trachelectomy would be preferred but because  of her obesity and confounding comorbidities, I would prefer to attempt this  vaginally.   I recommended a trial Megace 40 mg daily until she has completed  convalescence from her multiple extractions.  I would like to reexamine her  in approximately 2 months and at that time we would have the opportunity to  determine whether there is any cervical descent after induration and  reaction from a recent surgery has resolved, and would also have a chance to  observe her tolerance of Megace.  I answered multiple questions and the  patient was given a prescription.      John T. Kyla Balzarine, M.D.  Electronically Signed     JTS/MEDQ  D:  06/27/2006  T:  06/27/2006  Job:  [id number]   cc:   Huel Cote, M.D.  Fax: [phone]   [name], M.D.  Fax: [phone]   Lindaann Slough, M.D.  Fax: [phone]   Telford Nab, R.N.  [address]  [place], [place] [address]

## 2011-04-28 NOTE — Discharge Summary (Signed)
NAMELECRETIA, BUCZEK               ACCOUNT NO.:  [id number]   MEDICAL RECORD NO.:  [id number]          PATIENT TYPE:  INP   LOCATION:  9320                          FACILITY:  [facility]   PHYSICIAN:  Huel Cote, M.D. DATE OF BIRTH:  [birth year]   DATE OF ADMISSION:  05/08/2006  DATE OF DISCHARGE:  05/11/2006                                 DISCHARGE SUMMARY   DISCHARGE DIAGNOSES:  1.  Abnormal uterine bleeding.  2.  Morbid obesity.  3.  Complex hyperplasia on endometrial biopsy.  4.  Status post supracervical total abdominal hysterectomy and bilateral      salpingo-oophorectomy with intraoperative pathology stated as complex      hyperplasia with no malignancy identified.  5.  Final pathology revealing a well-differentiated endometrioid carcinoma,      superficial grade 1, which arose in a background of extensive complex      hyperplasia.  6.  Diabetes mellitus.   DISCHARGE MEDICATIONS:  1.  Percocet 1-2 tablets p.o. every 3-4 hours p.r.n.  2.  Avandamet.  At discharge, the patient had been placed on an increased      dose and was taking 4 mg/500 p.o. b.i.d.  Other discharge medications included the patient's preoperative medicines  which were as follows:  1.  Propranolol 4 mg p.o. daily.  2.  Levoxyl 75 mg p.o. daily.  3.  Norvasc 10 mg p.o. daily.  4.  Prevacid 30 mg p.o. daily.  5.  Zoloft 150 mg p.o. daily.  6.  Lipitor 20 mg p.o. daily.  7.  Cozaar 50 mg p.o. daily.   DISCHARGE FOLLOWUP:  The patient was to be seen in the office for staple  removal and an incision check within the next 3-4 days.   HOSPITAL COURSE:  The patient is a 55 year old G2 P2 who came in for a total  abdominal hysterectomy which had been scheduled, given a finding of complex  hyperplasia on an endometrial biopsy with focal atypia.  The patient was  having abnormal uterine bleeding with daily spotting.  She did have poor  medical history including borderline diabetes, chronic hypertension,  depression, sleep apnea, hypothyroidism and reflux disease.  She had been  struggling with some poor dentition issues and has been scheduled to have  oral surgery several times; however, has had to postpone this for various  reasons.  Prior to surgery, her hemoglobin A1c was borderline at 7.1.  A  random glucose was checked and was 107.  She had been checking her blood  sugars at home which were running 140-190.  The patient's height was 5 feet  5 inches, weight was 300 plus pounds.  Due to her morbid obesity and anatomy  which reflected a poor descensus of the uterus and a very long vagina, the  patient had a planned abdominal hysterectomy with an intraoperative  pathology consult planned to rule out any endometrial malignancy in the  complex hyperplasia.  She underwent a supracervical total abdominal  hysterectomy and bilateral salpingo-oophorectomy on [id number].  The  patient's cervix was left in place, given the extremely difficult nature of  the surgery and the patient losing approximately 1250 cc of blood during  surgery with very difficult visualization into her pelvis which was quite  deep.  Intraoperative pathology consult was obtained, and it was felt that  the patient had complex hyperplasia with atypia; however, no carcinoma was  identified.  Therefore, attempts to remove the cervix were abandoned, given  the patient's blood loss up to this point, and she was admitted for routine  postoperative care.  Postoperatively, her hemoglobin went from 11.1 to 9.0.  She had significant pain postoperatively which was slightly difficult to  control with Percocet and IV Dilaudid; however, it did gradually improve.  Her blood pressures postoperatively were 120-140/80s, blood sugars were 170s-  200s, and the patient had adjusted medications with an increase in her  Avandamet, and all blood sugars were eventually controlled to less than 200.  By postoperative day #2, she was passing flatus,  felt much improved.  She  was afebrile with stable vital signs.  Her abdomen was benign and incision  intact.  She was ambulating, tolerating a regular diet, and therefore was  felt stable for discharge home, and was discharged with the understanding  that she would return in several days for her staple removal.  Unfortunately, on final pathology it was revealed that the patient had a  superficial endometrioid carcinoma that was well differentiated, and the  plan was made to discuss with GYN oncology the problem of her cervix being  left in place and whether the patient would need further treatment of the  cervix.  Therefore, the patient was discharged with follow-up in our office  and follow-up scheduled with GYN oncology for further instruction.      Huel Cote, M.D.  Electronically Signed     KR/MEDQ  D:  06/05/2006  T:  06/06/2006  Job:  [id number]

## 2011-04-28 NOTE — Consult Note (Signed)
NAMENOELIE, RENFROW               ACCOUNT NO.:  [id number]   MEDICAL RECORD NO.:  [id number]          PATIENT TYPE:  OUT   LOCATION:  GYN                          FACILITY:  [facility]   PHYSICIAN:  John T. Kyla Balzarine, M.D.    DATE OF BIRTH:  22-Jun-1956   DATE OF CONSULTATION:  DATE OF DISCHARGE:                                   CONSULTATION   CHIEF COMPLAINT:  Endometrial cancer.   HISTORY OF PRESENT ILLNESS:  The patient had complex endometrial hyperplasia  with focal atypia but wished definitive surgery.  She was explored through a  Pfannenstiel incision on May 08, 2006 with supracervical hysterectomy and  bilateral salpingo-oophorectomy performed because of poor visualization;  cervix was left in situ.  Final pathology revealed a well-differentiated  noninvasive endometrial adenocarcinoma, FIGO grade 1, with extensive complex  atypical hyperplasia.  Endocervical canal margin was negative for invasive  disease as were tubes and ovaries.  The patient has recovered from a wound  cellulitis and recently underwent several oral extractions.  She has been  using Megace and has gained 16 pounds since her last encounter.  Otherwise  no difficulty with Megace.   PAST MEDICAL HISTORY:  Morbid obesity, adult-onset diabetes, chronic  hypertension, sleep apnea, mild depression, hypothyroidism, GERD and kidney  stones.  Status post surgical repair of ruptured disk x2, chronic back pain  related to subsequent DDD. The patient is post nephrostomy tube for  nephrolithiasis, subsequently removed.  She is status post shoulder surgery  and remote tubal ligation, remote T&A, SVD x2 and SAB, most recently  undergoing supracervical hysterectomy with BSO and multiple dental  extractions.   CURRENT MEDICATIONS:  1. Synthroid 75 mcg.  2. Furosemide 20 mg.  3. Potassium supplementation.  4. Propranolol 40 mg daily.  5. Cozaar 50 mg daily.  6. Avandamet 4/500 twice daily.  7. Zyrtec 10 daily.  8. Glipizide  ER 5 mg daily.  9. Methylphenidate 20 mg daily.  10.Lantus insulin 40 units nightly.  11.Prevacid 30 mg daily.  12.Lorazepam 1 mg p.r.n.  13.Zoloft 100 mg daily.  14.Sodium bicarbonate 650 several times daily.  15.Vicodin p.r.n.  16.Vitamin C.  17.Ferrous sulfate.   ALLERGIES:  HYPERSENSITIVITY TO NSAID'S.  GI UPSET WITH SULFA DRUGS AND  ERYTHROMYCIN.   PERSONAL AND SOCIAL HISTORY:  Negative, nonsmoker.   FAMILY HISTORY:  Negative for gynecologic, breast or colon cancer.   REVIEW OF SYSTEMS:  Chronic back and orthopedic pain.  Frequent GERD.  Otherwise negative in 10 systems exam.   PHYSICAL EXAMINATION:  Weight 318 pounds, blood pressure 173/97, repeat  150/95.  LYMPHATIC SURVEY:  Negative for pathologic lymphadenopathy.  LUNG FIELDS:  Clear.  HEART SOUNDS:  Regular rate and rhythm with no JVD.  BACK:  Mild tenderness to percussion over the lumbar spine with no CVA  tenderness.  ABDOMEN:  Obese, soft and benign with well-healed subumbilical laparoscopic  trocar incision and Pfannenstiel incision with no induration or erythema.  No tenderness, ascites, hernia or mass.  EXTREMITIES:  Full strength and  range of motion with no edemas or stasis changes.  PELVIC:  External genitalia and BUS normal to inspection and palpation.  Bladder and urethra are normal.  Speculum examination reveals very high  cervical remnant with visibly normal cervix.  Bimanual and rectovaginal  examinations are limited because of the habitus, but she has a very short  cervix with no adnexal pathology, absent uterus.  I grasped the cervix with  a tenaculum and there was some minimal descent.   PLAN:  I had a long discussion again with the patient detailing the options  for management.  She wishes definitive surgical management, and so we plan a  transvaginal trachelectomy (removal of cervix) which is tentatively  scheduled for next week.  She will need insulin protocol orders and will  discuss  perioperative oral hypoglycemic therapy with Anesthesia.  If descent  is extremely poor, I might settle for a large cone biopsy to effectively  remove the endocervical canal, as this is the highest risk site for  recurrent endometrial cancer.      John T. Kyla Balzarine, M.D.  Electronically Signed     JTS/MEDQ  D:  09/18/2006  T:  09/19/2006  Job:  [id number]   cc:   Telford Nab, R.N.  [address]  [place], [place] [address]   Huel Cote, M.D.  Fax: [phone]   [name], M.D.  Fax: [phone]   Lindaann Slough, M.D.  Fax: [phone]

## 2011-04-28 NOTE — Assessment & Plan Note (Signed)
[facility]                             PULMONARY OFFICE NOTE   NAME:[name], [name]                      MRN:          [id number]  DATE:11/29/2006                            DOB:          [birth year]    REASON FOR VISIT:  I saw Ms. Greif today in followup for her  obstructive sleep apnea and restless leg syndrome.   Since her last visit with me, in April of 2007 she had undergone dental  extraction as well as a hysterectomy.  She says that with everything she  had going on, she was having difficulty with the BiPAP machine and has  not been using it for the last several months.  Apparently with all of  her surgeries there was concern about her having oxygen desaturations.  She apparently was also found to be in congestive heart failure and with  more aggressive diuresis her breathing had improved.  She says with  regards to her restless leg syndrome, her symptoms have improved  considerably although she is still occasionally having some but this  seems to be helped by the use of Vicodin.   CURRENT MEDICATIONS:  1. Propranolol 40 mg daily.  2. Levoxyl 75 mcg daily.  3. Prevacid 30 mg daily.  4. Zoloft 150 mg daily.  5. Zyrtec 10 mg daily.  6. Ritalin as needed.  7. Cozaar 50 mg daily.  8. Cardizem 360 mg daily.  9. Glipizide 5 mg daily.  10.Ferrous sulfate 325 mg daily.  11.Lasix 40 mg b.i.d.  12.Lantus 40 units q.h.s.  13.Vicodin as needed.  14.Ativan 1 mg q.h.s. p.r.n.   PHYSICAL EXAMINATION:  VITAL SIGNS:  She is 310 pounds.  Temperature is  97.8, blood pressure 132/80, heart rate is 87, oxygen saturation 95% on  room air.  HEENT:  There is no sinus tenderness.  No nasal discharge.  No oral  lesions.  NECK:  No lymphadenopathy.  HEART:  S1, S2.  CHEST:  There was no wheezing and no rales.  ABDOMEN:  Soft, nontender, obese.  EXTREMITIES:  There was minimal ankle edema.   IMPRESSION AND PLANS:  1. Obstructive sleep apnea.  I have  asked her to resume the use of her      BiPAP machine.  I will also make arrangements for her to undergo an      auto BiPAP titration study as well as an overnight oximetry to      determine if she would need to have any changes with her BiPAP set-      up.  I also encouraged her to maintain her diet, exercise and      weight reduction program.  2. Restless leg syndrome.  I will check her recent laboratory tests to      see if she had her iron or Ferritin levels checked and if these are      within the normal range then we could discontinue her supplemental      iron.  3. Possible hypoxemia.  This will be further assessed after review of      her overnight  oximetry.  4. Symptoms of excessive daytime sleepiness.  She is currently on      Ritalin as needed.  It may be more beneficial for her to switch      this to Provigil as this would have a better side effect profile      compared to Ritalin  Additionally, I would prefer to have her      optimize with treatment of her sleep apnea, and then reassess      whether she actually needs to be on a stimulant medication.  5. I will see her in followup in approximately four to six weeks.     Coralyn Helling, MD  Electronically Signed    VS/MedQ  DD: 11/29/2006  DT: 11/30/2006  Job #: [id number]

## 2011-09-18 LAB — POCT I-STAT 3, ART BLOOD GAS (G3+)
Acid-Base Excess: 2
Bicarbonate: 32.7 — ABNORMAL HIGH
Bicarbonate: 34.8 — ABNORMAL HIGH
Bicarbonate: 40 — ABNORMAL HIGH
O2 Saturation: 93
O2 Saturation: 98
Operator id: 296031
TCO2: 33
TCO2: 36
TCO2: 42
pCO2 arterial: 51.4 — ABNORMAL HIGH
pCO2 arterial: 52.6 — ABNORMAL HIGH
pCO2 arterial: 58.2 — ABNORMAL HIGH
pCO2 arterial: 76.6
pH, Arterial: 7.378
pH, Arterial: 7.445 — ABNORMAL HIGH
pO2, Arterial: 114 — ABNORMAL HIGH
pO2, Arterial: 71 — ABNORMAL LOW
pO2, Arterial: 86

## 2011-09-18 LAB — URINE MICROSCOPIC-ADD ON

## 2011-09-18 LAB — BASIC METABOLIC PANEL
BUN: 12
BUN: 11
BUN: 8
BUN: 9
Calcium: 8.5
Chloride: 97
Chloride: 97
Creatinine, Ser: 0.73
Creatinine, Ser: 0.72
GFR calc Af Amer: >60
GFR calc non Af Amer: >60
GFR calc non Af Amer: >60
GFR calc non Af Amer: >60
Glucose, Bld: 139 — ABNORMAL HIGH
Glucose, Bld: 116 — ABNORMAL HIGH
Glucose, Bld: 249 — ABNORMAL HIGH
Glucose, Bld: 87
Glucose, Bld: 96
Potassium: 3.1 — ABNORMAL LOW
Potassium: 3.6
Potassium: 4.3
Sodium: 140
Sodium: 140

## 2011-09-18 LAB — CBC
HCT: 39.6
HCT: 40.9
HCT: 47.3 — ABNORMAL HIGH
Hemoglobin: 12.7
Hemoglobin: 13.2
Hemoglobin: 14.8
MCV: 80.3
Platelets: 237
RBC: 5.12 — ABNORMAL HIGH
RBC: 5.81 — ABNORMAL HIGH
RDW: 15.4
RDW: 15.9 — ABNORMAL HIGH
RDW: 16.3 — ABNORMAL HIGH
WBC: 11.5 — ABNORMAL HIGH
WBC: 21.1 — ABNORMAL HIGH
WBC: 9.6

## 2011-09-18 LAB — URINALYSIS, ROUTINE W REFLEX MICROSCOPIC
Glucose, UA: NEGATIVE
Specific Gravity, Urine: 1.02
pH: 6

## 2011-09-18 LAB — CULTURE, BAL-QUANTITATIVE W GRAM STAIN: Colony Count: >=100000

## 2011-09-18 LAB — I-STAT 8, (EC8 V) (CONVERTED LAB)
BUN: 18
Glucose, Bld: 410 — ABNORMAL HIGH
Hemoglobin: 18 — ABNORMAL HIGH
Operator id: 294501
Potassium: 6.6
Sodium: 135
TCO2: 32

## 2011-09-18 LAB — CARDIAC PANEL(CRET KIN+CKTOT+MB+TROPI)
CK, MB: 1.8
CK, MB: 2.7

## 2011-09-18 LAB — CK TOTAL AND CKMB (NOT AT ARMC)
CK, MB: 2.9
Total CK: 93

## 2011-09-18 LAB — PROTIME-INR
INR: 1
Prothrombin Time: 13.8

## 2011-09-18 LAB — MAGNESIUM
Magnesium: 2.1
Magnesium: 2.2

## 2011-09-18 LAB — RAPID URINE DRUG SCREEN, HOSP PERFORMED
Cocaine: NOT DETECTED
Opiates: POSITIVE — AB
Tetrahydrocannabinol: NOT DETECTED

## 2011-09-18 LAB — PHOSPHORUS: Phosphorus: 3.7

## 2011-09-18 LAB — POCT CARDIAC MARKERS: Operator id: 4001

## 2011-09-18 LAB — TSH: TSH: 2.407

## 2011-10-08 ENCOUNTER — Inpatient Hospital Stay (HOSPITAL_COMMUNITY)
Admission: EM | Admit: 2011-10-08 | Discharge: 2011-10-10 | DRG: 178 | Disposition: A | Discharge status: Home Health | Payer: Medicare Other | Attending: Internal Medicine | Admitting: Internal Medicine

## 2011-10-08 ENCOUNTER — Emergency Department (HOSPITAL_COMMUNITY): Payer: Medicare Other

## 2011-10-08 DIAGNOSIS — E039 Hypothyroidism, unspecified: Secondary | ICD-10-CM | POA: Diagnosis present

## 2011-10-08 DIAGNOSIS — G8929 Other chronic pain: Secondary | ICD-10-CM | POA: Diagnosis present

## 2011-10-08 DIAGNOSIS — J690 Pneumonitis due to inhalation of food and vomit: Principal | ICD-10-CM | POA: Diagnosis present

## 2011-10-08 DIAGNOSIS — M549 Dorsalgia, unspecified: Secondary | ICD-10-CM | POA: Diagnosis present

## 2011-10-08 DIAGNOSIS — G4733 Obstructive sleep apnea (adult) (pediatric): Secondary | ICD-10-CM | POA: Diagnosis present

## 2011-10-08 DIAGNOSIS — I509 Heart failure, unspecified: Secondary | ICD-10-CM | POA: Diagnosis present

## 2011-10-08 DIAGNOSIS — E876 Hypokalemia: Secondary | ICD-10-CM | POA: Diagnosis present

## 2011-10-08 DIAGNOSIS — I5022 Chronic systolic (congestive) heart failure: Secondary | ICD-10-CM | POA: Diagnosis present

## 2011-10-08 DIAGNOSIS — E662 Morbid (severe) obesity with alveolar hypoventilation: Secondary | ICD-10-CM | POA: Diagnosis present

## 2011-10-08 DIAGNOSIS — R4182 Altered mental status, unspecified: Secondary | ICD-10-CM

## 2011-10-08 DIAGNOSIS — Z79899 Other long term (current) drug therapy: Secondary | ICD-10-CM

## 2011-10-08 DIAGNOSIS — E119 Type 2 diabetes mellitus without complications: Secondary | ICD-10-CM | POA: Diagnosis present

## 2011-10-08 DIAGNOSIS — Z23 Encounter for immunization: Secondary | ICD-10-CM

## 2011-10-08 LAB — CBC
Hemoglobin: 13.2 g/dL (ref 12.0–15.0)
MCH: 26.7 pg (ref 26.0–34.0)
Platelets: 266 10*3/uL (ref 150–400)
RBC: 4.95 MIL/uL (ref 3.87–5.11)
WBC: 18.4 10*3/uL — ABNORMAL HIGH (ref 4.0–10.5)

## 2011-10-08 LAB — URINALYSIS, ROUTINE W REFLEX MICROSCOPIC
Bilirubin Urine: NEGATIVE
Glucose, UA: NEGATIVE mg/dL
Ketones, ur: NEGATIVE mg/dL
Nitrite: NEGATIVE
Specific Gravity, Urine: 1.01 (ref 1.005–1.030)
pH: 5 (ref 5.0–8.0)

## 2011-10-08 LAB — COMPREHENSIVE METABOLIC PANEL
ALT: 190 U/L — ABNORMAL HIGH (ref 0–35)
AST: 198 U/L — ABNORMAL HIGH (ref 0–37)
CO2: 31 mEq/L (ref 19–32)
Calcium: 8.9 mg/dL (ref 8.4–10.5)
Creatinine, Ser: 1.07 mg/dL (ref 0.50–1.10)
GFR calc Af Amer: 65 mL/min — ABNORMAL LOW (ref >90)
GFR calc non Af Amer: 57 mL/min — ABNORMAL LOW (ref >90)
Glucose, Bld: 81 mg/dL (ref 70–99)
Sodium: 141 mEq/L (ref 135–145)
Total Protein: 6.9 g/dL (ref 6.0–8.3)

## 2011-10-08 LAB — LACTIC ACID, PLASMA: Lactic Acid, Venous: 2.3 mmol/L — ABNORMAL HIGH (ref 0.5–2.2)

## 2011-10-08 LAB — AMMONIA: Ammonia: 66 umol/L — ABNORMAL HIGH (ref 11–60)

## 2011-10-08 LAB — POCT I-STAT 3, ART BLOOD GAS (G3+)
Acid-Base Excess: 5 mmol/L — ABNORMAL HIGH (ref 0.0–2.0)
O2 Saturation: 96 %
TCO2: 32 mmol/L (ref 0–100)
pCO2 arterial: 45.8 mmHg — ABNORMAL HIGH (ref 35.0–45.0)
pO2, Arterial: 81 mmHg (ref 80.0–100.0)

## 2011-10-08 LAB — DIFFERENTIAL
Basophils Absolute: 0 10*3/uL (ref 0.0–0.1)
Basophils Relative: 0 % (ref 0–1)
Eosinophils Absolute: 0 10*3/uL (ref 0.0–0.7)
Neutro Abs: 15.5 10*3/uL — ABNORMAL HIGH (ref 1.7–7.7)
Neutrophils Relative %: 84 % — ABNORMAL HIGH (ref 43–77)

## 2011-10-08 LAB — URINE MICROSCOPIC-ADD ON

## 2011-10-08 LAB — GLUCOSE, CAPILLARY: Glucose-Capillary: 171 mg/dL — ABNORMAL HIGH (ref 70–99)

## 2011-10-09 DIAGNOSIS — R4182 Altered mental status, unspecified: Secondary | ICD-10-CM

## 2011-10-09 DIAGNOSIS — I10 Essential (primary) hypertension: Secondary | ICD-10-CM

## 2011-10-09 DIAGNOSIS — J9600 Acute respiratory failure, unspecified whether with hypoxia or hypercapnia: Secondary | ICD-10-CM

## 2011-10-09 DIAGNOSIS — T400X1A Poisoning by opium, accidental (unintentional), initial encounter: Secondary | ICD-10-CM

## 2011-10-09 LAB — BLOOD GAS, ARTERIAL
Bicarbonate: 37 mEq/L — ABNORMAL HIGH (ref 20.0–24.0)
TCO2: 38.7 mmol/L (ref 0–100)
pCO2 arterial: 54.9 mmHg — ABNORMAL HIGH (ref 35.0–45.0)
pH, Arterial: 7.443 — ABNORMAL HIGH (ref 7.350–7.400)
pO2, Arterial: 58.9 mmHg — ABNORMAL LOW (ref 80.0–100.0)

## 2011-10-09 LAB — CBC
HCT: 42.2 % (ref 36.0–46.0)
MCV: 84.7 fL (ref 78.0–100.0)
RBC: 4.98 MIL/uL (ref 3.87–5.11)
WBC: 16.6 10*3/uL — ABNORMAL HIGH (ref 4.0–10.5)

## 2011-10-09 LAB — BASIC METABOLIC PANEL
CO2: 34 mEq/L — ABNORMAL HIGH (ref 19–32)
Calcium: 8.5 mg/dL (ref 8.4–10.5)
Chloride: 97 mEq/L (ref 96–112)
Potassium: 3.3 mEq/L — ABNORMAL LOW (ref 3.5–5.1)
Sodium: 143 mEq/L (ref 135–145)

## 2011-10-09 LAB — GLUCOSE, CAPILLARY
Glucose-Capillary: 86 mg/dL (ref 70–99)
Glucose-Capillary: 91 mg/dL (ref 70–99)

## 2011-10-09 LAB — RAPID URINE DRUG SCREEN, HOSP PERFORMED
Benzodiazepines: NOT DETECTED
Cocaine: NOT DETECTED
Opiates: NOT DETECTED

## 2011-10-10 ENCOUNTER — Inpatient Hospital Stay (HOSPITAL_COMMUNITY): Payer: Medicare Other

## 2011-10-10 DIAGNOSIS — J9600 Acute respiratory failure, unspecified whether with hypoxia or hypercapnia: Secondary | ICD-10-CM

## 2011-10-10 DIAGNOSIS — T50995A Adverse effect of other drugs, medicaments and biological substances, initial encounter: Secondary | ICD-10-CM

## 2011-10-10 DIAGNOSIS — R4182 Altered mental status, unspecified: Secondary | ICD-10-CM

## 2011-10-10 DIAGNOSIS — T400X1A Poisoning by opium, accidental (unintentional), initial encounter: Secondary | ICD-10-CM

## 2011-10-10 LAB — BASIC METABOLIC PANEL
BUN: 23 mg/dL (ref 6–23)
Chloride: 94 mEq/L — ABNORMAL LOW (ref 96–112)
GFR calc Af Amer: 83 mL/min — ABNORMAL LOW (ref >90)
Potassium: 3.7 mEq/L (ref 3.5–5.1)

## 2011-10-10 LAB — GLUCOSE, CAPILLARY
Glucose-Capillary: 98 mg/dL (ref 70–99)
Glucose-Capillary: 151 mg/dL — ABNORMAL HIGH (ref 70–99)

## 2011-10-10 LAB — URINE CULTURE: Culture: NO GROWTH

## 2011-10-10 LAB — CBC
HCT: 42.3 % (ref 36.0–46.0)
Platelets: 215 10*3/uL (ref 150–400)
RDW: 17.4 % — ABNORMAL HIGH (ref 11.5–15.5)
WBC: 10.9 10*3/uL — ABNORMAL HIGH (ref 4.0–10.5)

## 2011-10-10 LAB — HEMOGLOBIN A1C: Mean Plasma Glucose: 128 mg/dL — ABNORMAL HIGH (ref <117)

## 2011-10-15 LAB — CULTURE, BLOOD (ROUTINE X 2): Culture  Setup Time: 201210290254

## 2011-10-16 ENCOUNTER — Encounter: Payer: Self-pay | Admitting: Adult Health

## 2011-10-17 ENCOUNTER — Encounter: Payer: Self-pay | Admitting: Adult Health

## 2011-10-17 ENCOUNTER — Ambulatory Visit (INDEPENDENT_AMBULATORY_CARE_PROVIDER_SITE_OTHER): Payer: Medicare Other | Admitting: Adult Health

## 2011-10-17 VITALS — BP 122/78 | HR 78 | Temp 98.5°F | Ht 65.0 in | Wt 290.0 lb

## 2011-10-17 DIAGNOSIS — J9612 Chronic respiratory failure with hypercapnia: Secondary | ICD-10-CM | POA: Insufficient documentation

## 2011-10-17 DIAGNOSIS — J9610 Chronic respiratory failure, unspecified whether with hypoxia or hypercapnia: Secondary | ICD-10-CM

## 2011-10-17 DIAGNOSIS — G4730 Sleep apnea, unspecified: Secondary | ICD-10-CM

## 2011-10-17 NOTE — Assessment & Plan Note (Signed)
Restart BIPAP , new order to DME sent for new machine with auto titration  Cont on O2 with act and At bedtime

## 2011-10-17 NOTE — Progress Notes (Signed)
Subjective:    Patient ID: [name], female    DOB: 1956/05/22, 55 y.o.   MRN: [id number]  HPI 64 with known hx of OSA and OHS on BiPAP   [facility]  Last seen in office by Dr. Craige Cotta 11/29/2006. Pt was recently admitted October 28 through 10/10/2011 for altered mental status, obstructive sleep apnea, and hypercapnic respiratory failure. Patient was initially admitted with altered mental status worked up for possible underlying infection with suspected SIRS. Patient was ruled out for an acute infectious process. Patient was found to have a PCO2 of 54 elevated pro BNP at 4471. The patient was given IV Lasix with improvement and resolution of pulmonary vascular congestion on x-ray. Patient is on chronic pain medicines, including methadone. This was felt to be a contributing factor along with noncompliance of her BiPAP. Patient has an extensive history of obstructive sleep apnea and obesity hypoventilation syndrome. She says that she has not worn her BiPAP in a very long time. She says that she her machine was tHROWN away by family member. Patient's mental status improved within 24 hours. Patient was started on oxygen therapy at 2 L. It was recommended to wear her BiPAP. Since discharge unfortunately, she has not started BiPAP. Due to not having a machine at home.  Since discharge. Patient states she feels much improved with decreased shortness of breath and decreased confusional episodes. She denies any cough, chest pain, orthopnea, PND, or increased leg swelling.  PMH:  1. Significant for endometrial carcinoma. She is status post  hysterectomy.  2. Diabetes. -diet controlled  3. Morbid obesity.  4. Severe obstructive sleep apnea. /OHS  5. Depression.  6. Hypothyroidism.  7. His gastroesophageal reflux disease.  8. Nephrolithiasis status post nephrostomy tube placements previously.  9. Chronic low back pain. -on methadone  10.Shoulder surgery.  11. ADD  12.Restless leg  syndrome.  13.Coronary artery disease.  14.Congestive heart failure with ejection fraction of 50%, for which  she sees Dr. Algie Coffer.  2008 -prolonged critical illness with vent dependent resp failure   Allergies  Allergen Reactions  . Aspirin   . Erythromycin   . Nsaids   . Sulfamethoxazole W/Trimethoprim     History   Social History  . Marital Status: Married    Spouse Name: N/A    Number of Children: N/A  . Years of Education: N/A   Occupational History  . Not on file.   Social History Main Topics  . Smoking status: Former Smoker -- 0.5 packs/day for 2 years  . Smokeless tobacco: Not on file   Comment: quit in teenage years  . Alcohol Use: No  . Drug Use: No  . Sexually Active: Not on file   Other Topics Concern  . Not on file   Social History Narrative  . No narrative on file     Review of Systems Constitutional:   No  weight loss, night sweats,  Fevers, chills,  ++fatigue, or  lassitude.  HEENT:   No headaches,  Difficulty swallowing,  Tooth/dental problems, or  Sore throat,                No sneezing, itching, ear ache, nasal congestion, post nasal drip,   CV:  No chest pain,  Orthopnea, PND,  anasarca, dizziness, palpitations, syncope.   GI  No heartburn, indigestion, abdominal pain, nausea, vomiting, diarrhea, change in bowel habits, loss of appetite, bloody stools.   Resp:No excess mucus, no productive cough,  No non-productive cough,  No coughing up of blood.  No change in color of mucus.  No wheezing.  No chest wall deformity  Skin: no rash or lesions.  GU: no dysuria, change in color of urine, no urgency or frequency.  No flank pain, no hematuria   MS:  No joint pain or swelling.  No decreased range of motion.  No back pain.  Psych:  No change in mood or affect. No depression or anxiety.  No memory loss.         Objective:   Physical Exam  GEN: A/Ox3; pleasant , NAD, morbidly obese   HEENT:  [place]/AT,  EACs-clear, TMs-wnl, NOSE-clear,  THROAT-clear, no lesions, no postnasal drip or exudate noted.   NECK:  Supple w/ fair ROM; no JVD; normal carotid impulses w/o bruits; no thyromegaly or nodules palpated; no lymphadenopathy.  RESP  Clear  P & A; w/o, wheezes/ rales/ or rhonchi.no accessory muscle use, no dullness to percussion  CARD:  RRR, no m/r/g  ,tr peripheral edema, pulses intact, no cyanosis or clubbing.  GI:   Soft & nt; nml bowel sounds; no organomegaly or masses detected.  Musco: Warm bil, no deformities or joint swelling noted.   Neuro: alert, no focal deficits noted.    Skin: Warm, no lesions or rashes         Assessment & Plan:

## 2011-10-17 NOTE — Progress Notes (Signed)
Reviewed and agree with plan.

## 2011-10-17 NOTE — Patient Instructions (Signed)
Need to restart using BIPAP at night and naps  follow up Dr. Craige Cotta  In 2 weeks  Goal is weight loss.  Please contact office for sooner follow up if symptoms do not improve or worsen or seek emergency care

## 2011-11-07 ENCOUNTER — Encounter: Payer: Self-pay | Admitting: Pulmonary Disease

## 2011-11-07 ENCOUNTER — Ambulatory Visit (INDEPENDENT_AMBULATORY_CARE_PROVIDER_SITE_OTHER): Payer: Medicare Other | Admitting: Pulmonary Disease

## 2011-11-07 DIAGNOSIS — J9610 Chronic respiratory failure, unspecified whether with hypoxia or hypercapnia: Secondary | ICD-10-CM

## 2011-11-07 DIAGNOSIS — J9612 Chronic respiratory failure with hypercapnia: Secondary | ICD-10-CM

## 2011-11-07 DIAGNOSIS — G4730 Sleep apnea, unspecified: Secondary | ICD-10-CM

## 2011-11-07 NOTE — Assessment & Plan Note (Signed)
She has chronic hypoxic and hypercapnic respiratory failure based on OSA/OHS and morbid obesity.  Will arrange for auto BPAP and continue supplemental oxygen at 2 liters 24/7.

## 2011-11-07 NOTE — Patient Instructions (Signed)
Will arrange for BPAP machine at home Will call with report from machine Follow up in 3 months

## 2011-11-07 NOTE — Assessment & Plan Note (Signed)
She will need to use BPAP.  Will try to arrange for auto BPAP based on chronic respiratory failure.

## 2011-11-07 NOTE — Progress Notes (Signed)
Chief Complaint  Patient presents with  . Follow-up    Tries to wear Bipap every night but gets "agrivated" due to her mask not fitting good. Patient states she is better since last visit as long as she keeps the oxygen on. Denies sob, wheezing, cough, chest pain, and chest tightness.    History of Present Illness: [name] is a 55 y.o. female OSA/OHS.  She has not received her BPAP yet.  She was told she needs to have a new sleep test.  She has been using oxygen at 2 liters with CPAP.  She has trouble sleeping at night, and feels her hospital set up worked much better.  Past Medical History  Diagnosis Date  . OSA (obstructive sleep apnea)   . CHF (congestive heart failure)   . Diabetes mellitus   . Altered mental status     2nd to hypercapnea  . RLS (restless legs syndrome)   . Acute and chronic respiratory failure with hypercapnia   . Hypoxemia   . Endometrial cancer   . Morbid obesity   . Depression   . Hypothyroidism   . GERD (gastroesophageal reflux disease)   . Nephrolithiasis   . Chronic low back pain   . ADD (attention deficit disorder)   . Restless leg syndrome   . Coronary artery disease     Past Surgical History  Procedure Date  . Back surgery     x2  . Shoulder surgery     x2  . Vaginal hysterectomy     x2  . Dental surgery   . Tonsillectomy   . Sp perc nephrostomy     Current Outpatient Prescriptions on File Prior to Visit  Medication Sig Dispense Refill  . Cholecalciferol (VITAMIN D3) 2000 UNITS TABS Take 1 tablet by mouth daily.        . furosemide (LASIX) 40 MG tablet Take 40 mg by mouth 3 (three) times daily.       Marland Kitchen levothyroxine (SYNTHROID, LEVOTHROID) 75 MCG tablet Take 75 mcg by mouth daily.        Marland Kitchen losartan (COZAAR) 50 MG tablet Take 100 mg by mouth daily.       Marland Kitchen METHADONE HCL PO 60 mg. 55 mg daily      . methylphenidate (RITALIN) 20 MG tablet as needed. 20 mg in the am and 10 mg at noon      . potassium chloride SA  (K-DUR,KLOR-CON) 20 MEQ tablet Take 20 mEq by mouth 2 (two) times daily.        . propranolol (INDERAL) 40 MG tablet 1/2 tablet twice a day       . sertraline (ZOLOFT) 100 MG tablet Take 100 mg by mouth daily.          Allergies  Allergen Reactions  . Aspirin   . Erythromycin   . Nsaids   . Sulfamethoxazole W/Trimethoprim     Physical Exam:  Blood pressure 122/78, pulse 90, temperature 97.6 F (36.4 C), temperature source Oral, height 5\' 5"  (1.651 m), weight 298 lb 12.8 oz (135.535 kg), SpO2 96.00%.  Body mass index is 49.72 kg/(m^2).  General - Obese, using supplemental oxygen HEENT - no sinus tenderness, no oral exudate, no LAN Cardiac - s1s2 regular Chest - no wheeze/rales Abdomen - soft, nontender Extremities - 1+ ankle edema Skin - no rashes Neurologic - normal strength Psychiatric - normal mood, behavior  ABG    Component Value Date/Time   PHART 7.443* 10/09/2011 4098  PCO2ART 54.9* 10/09/2011 0439   PO2ART 58.9* 10/09/2011 0439   HCO3 37.0* 10/09/2011 0439   TCO2 38.7 10/09/2011 0439   O2SAT 91.1 10/09/2011 0439      Assessment/Plan:  SLEEP APNEA She will need to use BPAP.  Will try to arrange for auto BPAP based on chronic respiratory failure.  Hypercapnic respiratory failure, chronic She has chronic hypoxic and hypercapnic respiratory failure based on OSA/OHS and morbid obesity.  Will arrange for auto BPAP and continue supplemental oxygen at 2 liters 24/7.     Outpatient Encounter Prescriptions as of 11/07/2011  Medication Sig Dispense Refill  . b complex vitamins tablet Take 1 tablet by mouth daily.        . Cholecalciferol (VITAMIN D3) 2000 UNITS TABS Take 1 tablet by mouth daily.        . furosemide (LASIX) 40 MG tablet Take 40 mg by mouth 3 (three) times daily.       Marland Kitchen levothyroxine (SYNTHROID, LEVOTHROID) 75 MCG tablet Take 75 mcg by mouth daily.        Marland Kitchen losartan (COZAAR) 50 MG tablet Take 100 mg by mouth daily.       Marland Kitchen METHADONE HCL PO  60 mg. 55 mg daily      . methylphenidate (RITALIN) 20 MG tablet as needed. 20 mg in the am and 10 mg at noon      . potassium chloride SA (K-DUR,KLOR-CON) 20 MEQ tablet Take 20 mEq by mouth 2 (two) times daily.        . propranolol (INDERAL) 40 MG tablet 1/2 tablet twice a day       . sertraline (ZOLOFT) 100 MG tablet Take 100 mg by mouth daily.          [name] Pager:  [phone] 11/07/2011, 11:13 AM

## 2011-11-09 ENCOUNTER — Institutional Professional Consult (permissible substitution): Payer: Medicare Other | Admitting: Pulmonary Disease

## 2012-01-02 ENCOUNTER — Telehealth: Payer: Self-pay | Admitting: Pulmonary Disease

## 2012-01-02 NOTE — Telephone Encounter (Signed)
Pt returned call. [name]  

## 2012-01-02 NOTE — Telephone Encounter (Signed)
LMTCB

## 2012-01-02 NOTE — Telephone Encounter (Signed)
lmomtcb x1 

## 2012-01-03 NOTE — Telephone Encounter (Signed)
lmomtcb x 2  

## 2012-01-04 NOTE — Telephone Encounter (Signed)
Dr. Craige Cotta have you received any downloads on this patient? Please advise. Carron Curie, CMA

## 2012-01-05 NOTE — Telephone Encounter (Signed)
I have not received download.  Please check with her DME if this is available, and then have this sent for my review.

## 2012-01-05 NOTE — Telephone Encounter (Signed)
Accidentally closed phone note. ATC [facility] and was on hold x 8 minutes and did not get anyone. WCB

## 2012-01-08 NOTE — Telephone Encounter (Signed)
I called [facility] and they are re faxing download over. Will await fax

## 2012-01-08 NOTE — Telephone Encounter (Signed)
I have received fax and placed in VS look at. Please advise dr. Craige Cotta, thanks

## 2012-01-09 ENCOUNTER — Encounter: Payer: Self-pay | Admitting: Pulmonary Disease

## 2012-01-09 ENCOUNTER — Telehealth: Payer: Self-pay | Admitting: Pulmonary Disease

## 2012-01-09 DIAGNOSIS — J9610 Chronic respiratory failure, unspecified whether with hypoxia or hypercapnia: Secondary | ICD-10-CM

## 2012-01-09 DIAGNOSIS — G4733 Obstructive sleep apnea (adult) (pediatric): Secondary | ICD-10-CM

## 2012-01-09 NOTE — Telephone Encounter (Signed)
Auto BPAP 11/13/11 to 11/26/11>>Used on 12 of 14 days with average 2 hrs 17 min.  Average AHI 28 (8.6 obstructive, 19.6 central) with median setting 13/9 cm H2O, 95th percentile setting 15/11 cm H2O.  Unable to reach pt on phone.  Will have my nurse inform pt that she still has trouble with her sleep apnea.  Will set BPAP at 15/11 cm H2O, and repeat download in one month.    She also needs to use her BPAP for the entire time she is asleep.

## 2012-01-09 NOTE — Telephone Encounter (Signed)
lmomtcb x1 

## 2012-01-16 NOTE — Telephone Encounter (Signed)
lmomtcb x 2  

## 2012-01-17 ENCOUNTER — Encounter: Payer: Self-pay | Admitting: *Deleted

## 2012-01-17 NOTE — Telephone Encounter (Signed)
lmomtcb x3. Will send pt a letter advising her to call our office for these results

## 2012-01-24 ENCOUNTER — Telehealth: Payer: Self-pay | Admitting: Pulmonary Disease

## 2012-01-24 NOTE — Telephone Encounter (Signed)
Will have my nurse inform pt that she still has trouble with her sleep apnea. Will set BPAP at 15/11 cm H2O, and repeat download in one month.  She also needs to use her BPAP for the entire time she is asleep.   I spoke with patient about results and she verbalized understanding and had no questions

## 2012-02-13 ENCOUNTER — Ambulatory Visit: Payer: Medicare Other | Admitting: Pulmonary Disease

## 2012-02-13 ENCOUNTER — Encounter: Payer: Self-pay | Admitting: Pulmonary Disease

## 2012-02-14 ENCOUNTER — Telehealth: Payer: Self-pay | Admitting: Pulmonary Disease

## 2012-02-14 NOTE — Telephone Encounter (Signed)
Called # provided above.  Spoke with pt's granddaughter who states pt is unavailable at this time but she will have her return call.

## 2012-02-19 NOTE — Telephone Encounter (Signed)
LMOMTCB x 1 

## 2012-02-20 NOTE — Telephone Encounter (Signed)
LMOM for pt TCBx2 

## 2012-02-21 NOTE — Telephone Encounter (Signed)
Spoke with pt. She states nothing needed. She had question about her machine and she states had her question answered already by her DME company. I advised to just call us if there is anything we can do and she verbalized understanding.

## 2012-03-06 ENCOUNTER — Telehealth: Payer: Self-pay | Admitting: Pulmonary Disease

## 2012-03-06 DIAGNOSIS — G4733 Obstructive sleep apnea (adult) (pediatric): Secondary | ICD-10-CM

## 2012-03-06 NOTE — Telephone Encounter (Signed)
lmomtcb  

## 2012-03-06 NOTE — Telephone Encounter (Signed)
Pt called back to add that she does NOT have a bipap or cpap machine. Hers has been broken for a "long time".  She says that TP was "adament" that she use a cpap. Needs asap. Hazel Sams

## 2012-03-06 NOTE — Telephone Encounter (Signed)
Spoke with patient-she says her insurance company states she needs an full sleep study not a home sleep study. Will need VS's approval to place order. Pt aware that VS is not back in the office until Thursday morning and will address then. She states she has had 2 sleep studies previously but they are too old to use for getting a CPAP or BiPAP.

## 2012-03-06 NOTE — Telephone Encounter (Signed)
I have placed order for split-night sleep study.

## 2012-03-06 NOTE — Telephone Encounter (Signed)
I spoke with pt and is aware study has been ordered and she will be called to get this set up

## 2012-03-06 NOTE — Telephone Encounter (Signed)
Spoke with patient-she says

## 2012-03-19 ENCOUNTER — Ambulatory Visit: Payer: Medicare Other | Admitting: Pulmonary Disease

## 2012-03-26 ENCOUNTER — Ambulatory Visit (HOSPITAL_BASED_OUTPATIENT_CLINIC_OR_DEPARTMENT_OTHER): Payer: Medicare Other | Attending: Pulmonary Disease | Admitting: General Practice

## 2012-03-26 VITALS — Ht 65.0 in | Wt 298.0 lb

## 2012-03-26 DIAGNOSIS — Z6841 Body Mass Index (BMI) 40.0 and over, adult: Secondary | ICD-10-CM | POA: Insufficient documentation

## 2012-03-26 DIAGNOSIS — Z79899 Other long term (current) drug therapy: Secondary | ICD-10-CM | POA: Insufficient documentation

## 2012-03-26 DIAGNOSIS — I4949 Other premature depolarization: Secondary | ICD-10-CM | POA: Insufficient documentation

## 2012-03-26 DIAGNOSIS — I498 Other specified cardiac arrhythmias: Secondary | ICD-10-CM | POA: Insufficient documentation

## 2012-03-26 DIAGNOSIS — G4733 Obstructive sleep apnea (adult) (pediatric): Secondary | ICD-10-CM | POA: Insufficient documentation

## 2012-03-26 DIAGNOSIS — E662 Morbid (severe) obesity with alveolar hypoventilation: Secondary | ICD-10-CM | POA: Insufficient documentation

## 2012-04-15 ENCOUNTER — Ambulatory Visit: Payer: Medicare Other | Admitting: Pulmonary Disease

## 2012-04-19 DIAGNOSIS — G4733 Obstructive sleep apnea (adult) (pediatric): Secondary | ICD-10-CM

## 2012-04-19 DIAGNOSIS — Z6841 Body Mass Index (BMI) 40.0 and over, adult: Secondary | ICD-10-CM

## 2012-04-19 DIAGNOSIS — Z79899 Other long term (current) drug therapy: Secondary | ICD-10-CM

## 2012-04-19 DIAGNOSIS — E662 Morbid (severe) obesity with alveolar hypoventilation: Secondary | ICD-10-CM

## 2012-04-19 DIAGNOSIS — I4949 Other premature depolarization: Secondary | ICD-10-CM

## 2012-04-19 DIAGNOSIS — I498 Other specified cardiac arrhythmias: Secondary | ICD-10-CM

## 2012-04-19 NOTE — Procedures (Signed)
[name], [name] NO.:  [id number]  MEDICAL RECORD NO.:  [id number]          PATIENT TYPE:  OUT  LOCATION:  SLEEP CENTER                 FACILITY:  [facility]  PHYSICIAN:  Coralyn Helling, MD        DATE OF BIRTH:  1956-06-06  DATE OF STUDY:  03/26/2012                           NOCTURNAL POLYSOMNOGRAM  REFERRING PHYSICIAN:  Coralyn Helling, MD  FACILITY:  [facility].  REFERRING PHYSICIAN:  Coralyn Helling, MD  INDICATION FOR STUDY:  [name] is a 56 year old female, who has a prior diagnosis of obstructive sleep apnea.  She had difficulty tolerating CPAP therapy.  She also carries a diagnosis of obesity hypoventilation syndrome and is currently on 2 L supplemental oxygen. She is referred to the sleep lab for further evaluation of her sleep- disordered breathing.  Height is 5 feet 5 inches, weight is 298 pounds, BMI is 50.  Neck size is 17.5 inches.  EPWORTH SLEEPINESS SCORE:  22.  MEDICATIONS:  Ritalin, Synthroid, Cozaar, Lasix, Klor-Con, methadone, propranolol, sertraline, Prevacid, Zyrtec, and vitamin D3.  SLEEP ARCHITECTURE:  Total recording time was 404 minutes.  Total sleep time was 363 minutes.  Sleep efficiency was 90%.  Sleep latency was 4 minutes.  REM latency was 215 minutes.  The patient was observed in all stages of sleep.  She slept exclusively in the non-supine position.  RESPIRATORY DATA:  The average respiratory rate was 12.  Loud snoring was noted by the technician.  The overall apnea-hypopnea index was 36.6. There were 105 central apneic events, 6 mixed respiratory events, and the remainder of the events were obstructive in nature.  The REM apnea- hypopnea index was 31.2 versus a non-REM apnea-hypopnea index was 37.2.  OXYGEN DATA:  The baseline oxygenation was 96%.  The oxygen saturation nadir was 91%.  The study was conducted with the patient using 2 L of supplemental oxygen.  CARDIAC DATA:  The average heart rate 57 and the rhythm  strip showed sinus rhythm with frequent PVCs and episodes of bigeminy.  MOVEMENT-PARASOMNIA:  The periodic limb movement index was 3.1 and the patient had one restroom trip.  IMPRESSIONS-RECOMMENDATIONS:  This study shows evidence for severe obstructive sleep apnea with an apnea-hypopnea index of 36.6.  She had both central and obstructive events.  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  She was able to maintain her oxygenation with 2 L supplemental oxygen.  She did have frequent episodes of PVCs and bigeminy, and clinical correlation will be necessary to determine the significance of this.  In addition to diet, exercise, and weight reduction, I would recommend that the patient undergo additional therapy with either CPAP or BiPAP, and then have her supplemental oxygen adjusted as needed.     Coralyn Helling, MD Diplomat, American Board of Sleep Medicine    VS/MEDQ  D:  04/18/2012 14:27:07  T:  04/19/2012 03:30:28  Job:  [id number]

## 2012-04-22 ENCOUNTER — Telehealth: Payer: Self-pay | Admitting: Pulmonary Disease

## 2012-04-22 DIAGNOSIS — G4730 Sleep apnea, unspecified: Secondary | ICD-10-CM

## 2012-04-22 NOTE — Telephone Encounter (Signed)
PSG 03/26/12>>AHI 36.6, SpO2 low 91%.  Test done with 2 liters oxygen.  PLMI 3.1.  PVC's, bigeminy.  Attempted to call pt to discuss results.  Will have my nurse inform patient that sleep study showed severe sleep apnea as expected.  She will need to schedule ROV to review sleep test results and discuss treatment options.

## 2012-04-23 NOTE — Telephone Encounter (Signed)
lmomtcb x1 for pt to schedule rov for pt to discuss results

## 2012-04-25 NOTE — Telephone Encounter (Signed)
lmomtcb x 2  

## 2012-04-29 NOTE — Telephone Encounter (Signed)
lmomtcb x 3  

## 2012-05-01 ENCOUNTER — Encounter: Payer: Self-pay | Admitting: *Deleted

## 2012-05-01 NOTE — Telephone Encounter (Signed)
Lm for ptcb. Still NA. Will mail letter to pt home advising her to call back

## 2012-05-09 ENCOUNTER — Encounter: Payer: Self-pay | Admitting: Pulmonary Disease

## 2012-05-09 ENCOUNTER — Ambulatory Visit (INDEPENDENT_AMBULATORY_CARE_PROVIDER_SITE_OTHER): Payer: Medicare Other | Admitting: Pulmonary Disease

## 2012-05-09 VITALS — BP 156/84 | HR 78 | Temp 98.3°F | Ht 65.0 in | Wt 308.9 lb

## 2012-05-09 DIAGNOSIS — J9612 Chronic respiratory failure with hypercapnia: Secondary | ICD-10-CM

## 2012-05-09 DIAGNOSIS — J9610 Chronic respiratory failure, unspecified whether with hypoxia or hypercapnia: Secondary | ICD-10-CM

## 2012-05-09 DIAGNOSIS — G4733 Obstructive sleep apnea (adult) (pediatric): Secondary | ICD-10-CM

## 2012-05-09 NOTE — Assessment & Plan Note (Signed)
As expected she has severe OSA on recent sleep study done to ensure insurance payment for supplies.  She has failed previous attempts at CPAP. She also has OHS, and as such needs BPAP with supplemental oxygen.  Previous BPAP titration showed good control with BPAP 15/11 cm H2O.  Will arrange for this and continue 2 liters oxygen 24/7.  Will then check ONO with this set up and repeat BPAP download.

## 2012-05-09 NOTE — Assessment & Plan Note (Signed)
From OSA/OHS.  Explained importance of weight loss.  Will set up BPAP and continue 2 liters oxygen 24/7.

## 2012-05-09 NOTE — Patient Instructions (Signed)
Will arrange for BPAP machine Will get oxygen test using BPAP and oxygen Follow up in 2 months

## 2012-05-09 NOTE — Progress Notes (Signed)
Chief Complaint  Patient presents with  . Follow-up    pt here to discuss sleep stuidy    History of Present Illness: [name] is a 56 y.o. female OSA/OHS.  Auto BPAP 11/13/11 to 11/26/11>>Used on 12 of 14 days with average 2 hrs 17 min. Average AHI 28 (8.6 obstructive, 19.6 central) with median setting 13/9 cm H2O, 95th percentile setting 15/11 cm H2O.  PSG 03/26/12>>AHI 36.6, SpO2 low 91%. Test done with 2 liters oxygen. PLMI 3.1. PVC's, bigeminy.  She is sleeping better with oxygen, and does not get headaches as much.  She is still having trouble with her sleep.  She is not using CPAP or BPAP at present.  She was not able to be set up with equipment due to insurance requirement for more recent sleep study.   Past Medical History  Diagnosis Date  . OSA (obstructive sleep apnea)   . CHF (congestive heart failure)   . Diabetes mellitus   . Altered mental status     2nd to hypercapnea  . RLS (restless legs syndrome)   . Acute and chronic respiratory failure with hypercapnia   . Hypoxemia   . Endometrial cancer   . Morbid obesity   . Depression   . Hypothyroidism   . GERD (gastroesophageal reflux disease)   . Nephrolithiasis   . Chronic low back pain   . ADD (attention deficit disorder)   . Restless leg syndrome   . Coronary artery disease     Past Surgical History  Procedure Date  . Back surgery     x2  . Shoulder surgery     x2  . Vaginal hysterectomy     x2  . Dental surgery   . Tonsillectomy   . Sp perc nephrostomy     Allergies  Allergen Reactions  . Aspirin   . Erythromycin   . Nsaids   . Sulfamethoxazole W-Trimethoprim     Physical Exam:  Blood pressure 156/84, pulse 78, temperature 98.3 F (36.8 C), temperature source Oral, height 5\' 5"  (1.651 m), weight 308 lb 14.4 oz (140.116 kg), SpO2 97.00%.  Body mass index is 51.40 kg/(m^2).   General - Obese, using supplemental oxygen HEENT - no sinus tenderness, no oral exudate, no LAN Cardiac -  s1s2 regular Chest - no wheeze/rales Abdomen - soft, nontender Extremities - 1+ ankle edema Skin - no rashes Neurologic - normal strength Psychiatric - normal mood, behavior   Assessment/Plan:   Outpatient Encounter Prescriptions as of 05/09/2012  Medication Sig Dispense Refill  . b complex vitamins tablet Take 1 tablet by mouth daily.        . Cholecalciferol (VITAMIN D3) 2000 UNITS TABS Take 1 tablet by mouth daily.        . furosemide (LASIX) 40 MG tablet Take 40 mg by mouth 3 (three) times daily.       Marland Kitchen levothyroxine (SYNTHROID, LEVOTHROID) 75 MCG tablet Take 75 mcg by mouth daily.        Marland Kitchen losartan (COZAAR) 50 MG tablet Take 100 mg by mouth daily.       Marland Kitchen METHADONE HCL PO 60 mg. 55 mg daily      . methylphenidate (RITALIN) 20 MG tablet as needed. 20 mg in the am and 10 mg at noon      . potassium chloride SA (K-DUR,KLOR-CON) 20 MEQ tablet Take 20 mEq by mouth 2 (two) times daily.        . propranolol (INDERAL) 40 MG tablet  1/2 tablet every morning and 1 tablet at night      . sertraline (ZOLOFT) 100 MG tablet Take 150 mg by mouth daily.         Iqra Rotundo Pager:  [phone] 05/09/2012, 1:47 PM

## 2012-05-21 ENCOUNTER — Telehealth: Payer: Self-pay | Admitting: Pulmonary Disease

## 2012-05-21 NOTE — Telephone Encounter (Signed)
Called and spoke with [place] about this. Pt repeated sleep study in April 2013 and hasn't been on cpap or bipap since this study. Mayra Reel is checking on this and will return my call. Rhonda J Cobb

## 2012-05-21 NOTE — Telephone Encounter (Signed)
Spoke with patient and she has been on cpap or bipap since her most recent sleep study dated 03/26/12. Therefore, there is NOT a break in therapy. Pt is aware that [facility] will be calling to get this scheduled asap. [name] is contacting [facility] and asking them to contact patient to arrange set up.

## 2012-05-21 NOTE — Telephone Encounter (Signed)
Pt returned call. I advised her re: mindy's notes (that they are working on this). Pt will wait to hear back from either rhonda or [facility]. Hazel Sams

## 2012-05-21 NOTE — Telephone Encounter (Signed)
I spoke with [facility] and she states [name] was suppose to let our office know that pt had a break in service and would need to re qualify for this and would need to have another sleep study. Per Bjorn Loser lecretia was going to look into this. She will call lecretia and see what is going on--lmomtcb x1 fr pt to make her aware we are working on this

## 2012-06-07 ENCOUNTER — Telehealth: Payer: Self-pay | Admitting: Pulmonary Disease

## 2012-06-07 DIAGNOSIS — G4733 Obstructive sleep apnea (adult) (pediatric): Secondary | ICD-10-CM

## 2012-06-07 NOTE — Telephone Encounter (Signed)
Called, spoke with pt who states she has a bipap with nose piece and chin strap.  States she 'feels great on the lowest" setting but when it ramps and gets up to the highest pressure, she states it is painful and wakes her up.  She is requesting Dr. Evlyn Courier recommendations.  She is aware he is out of office until Monday and ok with this.  Dr. Craige Cotta, pls advise. Thank you.

## 2012-06-08 NOTE — Telephone Encounter (Signed)
Please inform patient that I have decreased her BPAP settings to 13/9 cm H2O.  She is to call back if she still has trouble.

## 2012-06-10 NOTE — Telephone Encounter (Signed)
lmomtcb x1 

## 2012-06-11 NOTE — Telephone Encounter (Signed)
LMTCBx2. Lameshia Hypolite, CMA  

## 2012-06-12 NOTE — Telephone Encounter (Signed)
lmomtcb  

## 2012-06-14 NOTE — Telephone Encounter (Signed)
lmomtcb and leave new msg for triage if anything further is needed regarding this msg.  Per triage protocol, will sign off on msg.

## 2012-07-09 ENCOUNTER — Ambulatory Visit: Payer: Medicare Other | Admitting: Pulmonary Disease

## 2012-07-11 ENCOUNTER — Encounter: Payer: Self-pay | Admitting: Pulmonary Disease

## 2012-08-07 ENCOUNTER — Ambulatory Visit: Payer: Medicare Other | Admitting: Pulmonary Disease

## 2012-09-13 ENCOUNTER — Ambulatory Visit: Payer: Medicare Other | Admitting: Pulmonary Disease

## 2012-09-25 ENCOUNTER — Telehealth: Payer: Self-pay | Admitting: Pulmonary Disease

## 2012-09-25 NOTE — Telephone Encounter (Signed)
I have placed in VS look at for him to review. Please advise Dr. Craige Cotta thanks

## 2012-09-26 NOTE — Telephone Encounter (Signed)
Form completed and put in my look at section on side A.

## 2012-09-26 NOTE — Telephone Encounter (Signed)
Pt aware and will come by and pick up letter

## 2012-10-23 ENCOUNTER — Ambulatory Visit: Payer: Medicare Other | Admitting: Pulmonary Disease

## 2012-11-04 ENCOUNTER — Telehealth: Payer: Self-pay | Admitting: Pulmonary Disease

## 2012-11-04 NOTE — Telephone Encounter (Signed)
BiPAP 05/23/12 to 07/08/12>>Used on 47 of 47 nights with average 4 hrs 48 min.  Average AHI 11 with BiPAP 13/9 cm H2O.  Will have my nurse inform patient that BiPAP report from last summer showed adequate control of her sleep apnea, but settings may need to be fined tuned.  Will discuss in more detail at [facility] on 11/19/12.

## 2012-11-04 NOTE — Telephone Encounter (Signed)
I spoke with patient about results and she verbalized understanding and had no questions 

## 2012-11-19 ENCOUNTER — Ambulatory Visit: Payer: Medicare Other | Admitting: Pulmonary Disease

## 2012-12-16 ENCOUNTER — Telehealth: Payer: Self-pay | Admitting: Pulmonary Disease

## 2012-12-16 NOTE — Telephone Encounter (Signed)
I spoke with pt and she stated she needs letter stating she is oxygen dependent for financial assistance through [employer]. She stated this is all it needs to state. She states she will be this way 12/19/12. She would like to pick this up then. Please advise Dr. Craige Cotta thanks

## 2012-12-18 ENCOUNTER — Encounter: Payer: Self-pay | Admitting: Pulmonary Disease

## 2012-12-19 NOTE — Telephone Encounter (Signed)
Letter completed and placed in my look at box on side A.

## 2012-12-19 NOTE — Telephone Encounter (Signed)
lmtcb x1 at # listed. Called alternate # in chart ATC pt Na UNABLE TO LEAVE vm vm HAS NOT BEEN SET UP YET WCB

## 2012-12-20 NOTE — Telephone Encounter (Signed)
lmomtcb x1 for pt. Called alternate # in chart NA unable to leave VM. WCB

## 2012-12-20 NOTE — Telephone Encounter (Signed)
Letter given to the pt and nothing further is needed.

## 2013-01-20 ENCOUNTER — Ambulatory Visit: Payer: Medicare Other | Admitting: Pulmonary Disease

## 2013-02-23 ENCOUNTER — Ambulatory Visit (INDEPENDENT_AMBULATORY_CARE_PROVIDER_SITE_OTHER): Payer: Medicare Other | Admitting: Family Medicine

## 2013-02-23 VITALS — BP 132/84 | HR 64 | Temp 98.7°F

## 2013-02-23 DIAGNOSIS — J209 Acute bronchitis, unspecified: Secondary | ICD-10-CM

## 2013-02-23 MED ORDER — AZITHROMYCIN 250 MG PO TABS: ORAL_TABLET | ORAL | Status: DC

## 2013-02-23 NOTE — Patient Instructions (Addendum)

## 2013-02-23 NOTE — Progress Notes (Signed)
Patient ID: [name] MRN: [id number], DOB: 29-Jun-1956, 57 y.o. Date of Encounter: 02/23/2013, 4:09 PM  Primary Physician: [name], MD  Chief Complaint:  Chief Complaint  Patient presents with  . Cough  . Nasal Congestion  . Fever  . Headache  . Sore Throat    HPI: 57 y.o. year old female presents with a 21 day history of nasal congestion, post nasal drip, sore throat, and cough. Mild sinus pressure. Afebrile. No chills. Nasal congestion thick and green/yellow. Cough is productive of green/yellow sputum and not associated with time of day. Ears feel full, leading to sensation of muffled hearing. Has tried OTC cold preps without success. No GI complaints. Appetite decreased.  Seemed to be improving with a zinc preparation, but now is worsening with dark colored mucous.  No shortness of breath.  No sick contacts, recent antibiotics, or recent travels.   No leg trauma, sedentary periods, h/o cancer, or tobacco use.  Past Medical History  Diagnosis Date  . OSA (obstructive sleep apnea)   . CHF (congestive heart failure)   . Diabetes mellitus   . Altered mental status     2nd to hypercapnea  . RLS (restless legs syndrome)   . Acute and chronic respiratory failure with hypercapnia   . Hypoxemia   . Endometrial cancer   . Morbid obesity   . Depression   . Hypothyroidism   . GERD (gastroesophageal reflux disease)   . Nephrolithiasis   . Chronic low back pain   . ADD (attention deficit disorder)   . Restless leg syndrome   . Coronary artery disease      Home Meds: Prior to Admission medications   Medication Sig Start Date End Date Taking? Authorizing Provider  b complex vitamins tablet Take 1 tablet by mouth daily.     Yes Historical Provider, MD  Cholecalciferol (VITAMIN D3) 2000 UNITS TABS Take 1 tablet by mouth daily.     Yes Historical Provider, MD  furosemide (LASIX) 40 MG tablet Take 40 mg by mouth 3 (three) times daily.    Yes Historical Provider, MD    glipiZIDE (GLUCOTROL XL) 5 MG 24 hr tablet Take 5 mg by mouth daily.   Yes Historical Provider, MD  lansoprazole (PREVACID) 30 MG capsule Take 30 mg by mouth daily.   Yes Historical Provider, MD  levothyroxine (SYNTHROID, LEVOTHROID) 75 MCG tablet Take 75 mcg by mouth daily.     Yes Historical Provider, MD  losartan (COZAAR) 50 MG tablet Take 100 mg by mouth daily.    Yes Historical Provider, MD  METHADONE HCL PO 60 mg. 55 mg daily   Yes Historical Provider, MD  methylphenidate (RITALIN) 20 MG tablet as needed. 20 mg in the am and 10 mg at noon   Yes Historical Provider, MD  potassium chloride SA (K-DUR,KLOR-CON) 20 MEQ tablet Take 20 mEq by mouth 2 (two) times daily.     Yes Historical Provider, MD  propranolol (INDERAL) 40 MG tablet 1/2 tablet every morning and 1 tablet at night   Yes Historical Provider, MD  sertraline (ZOLOFT) 100 MG tablet Take 150 mg by mouth daily.    Yes Historical Provider, MD    Allergies:  Allergies  Allergen Reactions  . Aspirin   . Erythromycin   . Nsaids   . Sulfamethoxazole W-Trimethoprim     History   Social History  . Marital Status: Married    Spouse Name: N/A    Number of Children: N/A  .  Years of Education: N/A   Occupational History  . Not on file.   Social History Main Topics  . Smoking status: Former Smoker -- 0.50 packs/day for 2 years  . Smokeless tobacco: Not on file     Comment: quit in teenage years  . Alcohol Use: No  . Drug Use: No  . Sexually Active: Not on file   Other Topics Concern  . Not on file   Social History Narrative  . No narrative on file     Review of Systems: Constitutional: negative for chills, fever, night sweats or weight changes Cardiovascular: negative for chest pain or palpitations Respiratory: negative for hemoptysis, wheezing, or shortness of breath Abdominal: negative for abdominal pain, nausea, vomiting or diarrhea Dermatological: negative for rash Neurologic: negative for  headache   Physical Exam: Blood pressure 132/84, pulse 64, temperature 98.7 F (37.1 C), temperature source Oral, SpO2 90.00%., There is no weight on file to calculate BMI. General: Well developed, well nourished, in no acute distress. Head: Normocephalic, atraumatic, eyes without discharge, sclera non-icteric, nares are congested. Bilateral auditory canals clear, TM's are without perforation, pearly grey with reflective cone of light bilaterally. No sinus TTP. Oral cavity moist, dentition normal. Posterior pharynx with post nasal drip and mild erythema. No peritonsillar abscess or tonsillar exudate. Neck: Supple. No thyromegaly. Full ROM. No lymphadenopathy. Lungs: Coarse breath sounds bilaterally without wheezes, rales, or rhonchi. Breathing is unlabored.  Heart: RRR with S1 S2. No murmurs, rubs, or gallops appreciated. Msk:  Strength and tone normal for age. Extremities: No clubbing or cyanosis. No edema. Neuro: Alert and oriented X 3. Moves all extremities spontaneously. CNII-XII grossly in tact. Psych:  Responds to questions appropriately with a normal affect.   Labs:   ASSESSMENT AND PLAN:  56 y.o. year old female with bronchitis. Acute bronchitis z pak  - -Mucinex -Tylenol/Motrin prn -Rest/fluids -RTC precautions -RTC 3-5 days if no improvement  Signed, Elvina Sidle, MD 02/23/2013 4:09 PM

## 2013-07-07 ENCOUNTER — Emergency Department (HOSPITAL_COMMUNITY): Payer: Medicare Other

## 2013-07-07 ENCOUNTER — Inpatient Hospital Stay (HOSPITAL_COMMUNITY)
Admission: EM | Admit: 2013-07-07 | Discharge: 2013-07-09 | DRG: 918 | Disposition: A | Discharge status: Home / Self Care | Payer: Medicare Other | Attending: Internal Medicine | Admitting: Internal Medicine

## 2013-07-07 ENCOUNTER — Encounter (HOSPITAL_COMMUNITY): Payer: Self-pay | Admitting: Vascular Surgery

## 2013-07-07 DIAGNOSIS — Z91199 Patient's noncompliance with other medical treatment and regimen due to unspecified reason: Secondary | ICD-10-CM

## 2013-07-07 DIAGNOSIS — Z6841 Body Mass Index (BMI) 40.0 and over, adult: Secondary | ICD-10-CM

## 2013-07-07 DIAGNOSIS — F329 Major depressive disorder, single episode, unspecified: Secondary | ICD-10-CM | POA: Diagnosis present

## 2013-07-07 DIAGNOSIS — Z87891 Personal history of nicotine dependence: Secondary | ICD-10-CM

## 2013-07-07 DIAGNOSIS — R0902 Hypoxemia: Secondary | ICD-10-CM

## 2013-07-07 DIAGNOSIS — G4733 Obstructive sleep apnea (adult) (pediatric): Secondary | ICD-10-CM

## 2013-07-07 DIAGNOSIS — F988 Other specified behavioral and emotional disorders with onset usually occurring in childhood and adolescence: Secondary | ICD-10-CM | POA: Diagnosis present

## 2013-07-07 DIAGNOSIS — Z9981 Dependence on supplemental oxygen: Secondary | ICD-10-CM

## 2013-07-07 DIAGNOSIS — E039 Hypothyroidism, unspecified: Secondary | ICD-10-CM | POA: Diagnosis present

## 2013-07-07 DIAGNOSIS — T403X4A Poisoning by methadone, undetermined, initial encounter: Principal | ICD-10-CM | POA: Diagnosis present

## 2013-07-07 DIAGNOSIS — J9612 Chronic respiratory failure with hypercapnia: Secondary | ICD-10-CM

## 2013-07-07 DIAGNOSIS — I10 Essential (primary) hypertension: Secondary | ICD-10-CM

## 2013-07-07 DIAGNOSIS — J189 Pneumonia, unspecified organism: Secondary | ICD-10-CM

## 2013-07-07 DIAGNOSIS — G8929 Other chronic pain: Secondary | ICD-10-CM | POA: Diagnosis present

## 2013-07-07 DIAGNOSIS — F3289 Other specified depressive episodes: Secondary | ICD-10-CM | POA: Diagnosis present

## 2013-07-07 DIAGNOSIS — T40601A Poisoning by unspecified narcotics, accidental (unintentional), initial encounter: Secondary | ICD-10-CM

## 2013-07-07 DIAGNOSIS — J9610 Chronic respiratory failure, unspecified whether with hypoxia or hypercapnia: Secondary | ICD-10-CM | POA: Diagnosis present

## 2013-07-07 DIAGNOSIS — K219 Gastro-esophageal reflux disease without esophagitis: Secondary | ICD-10-CM | POA: Diagnosis present

## 2013-07-07 DIAGNOSIS — G2581 Restless legs syndrome: Secondary | ICD-10-CM | POA: Diagnosis present

## 2013-07-07 DIAGNOSIS — T403X1A Poisoning by methadone, accidental (unintentional), initial encounter: Secondary | ICD-10-CM | POA: Diagnosis present

## 2013-07-07 DIAGNOSIS — I2510 Atherosclerotic heart disease of native coronary artery without angina pectoris: Secondary | ICD-10-CM | POA: Diagnosis present

## 2013-07-07 DIAGNOSIS — Z9119 Patient's noncompliance with other medical treatment and regimen: Secondary | ICD-10-CM

## 2013-07-07 DIAGNOSIS — Z79899 Other long term (current) drug therapy: Secondary | ICD-10-CM

## 2013-07-07 DIAGNOSIS — I509 Heart failure, unspecified: Secondary | ICD-10-CM | POA: Diagnosis present

## 2013-07-07 DIAGNOSIS — T400X1A Poisoning by opium, accidental (unintentional), initial encounter: Secondary | ICD-10-CM | POA: Diagnosis present

## 2013-07-07 DIAGNOSIS — Z8542 Personal history of malignant neoplasm of other parts of uterus: Secondary | ICD-10-CM

## 2013-07-07 DIAGNOSIS — E119 Type 2 diabetes mellitus without complications: Secondary | ICD-10-CM

## 2013-07-07 HISTORY — DX: Other complications of anesthesia, initial encounter: T88.59XA

## 2013-07-07 HISTORY — DX: Adverse effect of unspecified anesthetic, initial encounter: T41.45XA

## 2013-07-07 HISTORY — DX: Essential (primary) hypertension: I10

## 2013-07-07 HISTORY — DX: Other abnormalities of breathing: R06.89

## 2013-07-07 LAB — CBC
HCT: 40.3 % (ref 36.0–46.0)
HCT: 38 % (ref 36.0–46.0)
Hemoglobin: 11.8 g/dL — ABNORMAL LOW (ref 12.0–15.0)
Hemoglobin: 11.7 g/dL — ABNORMAL LOW (ref 12.0–15.0)
MCHC: 30.8 g/dL (ref 30.0–36.0)
MCV: 87.8 fL (ref 78.0–100.0)
MCV: 86.8 fL (ref 78.0–100.0)
RBC: 4.59 MIL/uL (ref 3.87–5.11)
RDW: 15.8 % — ABNORMAL HIGH (ref 11.5–15.5)
WBC: 10.3 10*3/uL (ref 4.0–10.5)
WBC: 10 10*3/uL (ref 4.0–10.5)

## 2013-07-07 LAB — POCT I-STAT 3, ART BLOOD GAS (G3+)
Acid-Base Excess: 5 mmol/L — ABNORMAL HIGH (ref 0.0–2.0)
Bicarbonate: 32.2 mEq/L — ABNORMAL HIGH (ref 20.0–24.0)
TCO2: 34 mmol/L (ref 0–100)
pO2, Arterial: 82 mmHg (ref 80.0–100.0)

## 2013-07-07 LAB — BASIC METABOLIC PANEL
BUN: 17 mg/dL (ref 6–23)
CO2: 30 mEq/L (ref 19–32)
Chloride: 102 mEq/L (ref 96–112)
Creatinine, Ser: 1.05 mg/dL (ref 0.50–1.10)
Glucose, Bld: 149 mg/dL — ABNORMAL HIGH (ref 70–99)

## 2013-07-07 LAB — CREATININE, SERUM
GFR calc Af Amer: 73 mL/min — ABNORMAL LOW (ref >90)
GFR calc non Af Amer: 63 mL/min — ABNORMAL LOW (ref >90)

## 2013-07-07 LAB — HEMOGLOBIN A1C: Hgb A1c MFr Bld: 5.7 % — ABNORMAL HIGH (ref <5.7)

## 2013-07-07 MED ORDER — B COMPLEX PO TABS: 1.0000 | ORAL_TABLET | Freq: Every day | ORAL | Status: DC

## 2013-07-07 MED ORDER — LORAZEPAM 2 MG/ML IJ SOLN
1.0000 mg | Freq: Once | INTRAMUSCULAR | Status: AC
  Administered 2013-07-07: 1 mg via INTRAVENOUS
  Filled 2013-07-07: qty 1

## 2013-07-07 MED ORDER — VITAMIN D3 50 MCG (2000 UT) PO TABS: 1.0000 | ORAL_TABLET | Freq: Every day | ORAL | Status: DC

## 2013-07-07 MED ORDER — PROPRANOLOL HCL 40 MG PO TABS
40.0000 mg | ORAL_TABLET | Freq: Two times a day (BID) | ORAL | Status: DC
  Administered 2013-07-08 – 2013-07-09 (×4): 40 mg via ORAL
  Filled 2013-07-07 (×6): qty 1

## 2013-07-07 MED ORDER — METHADONE HCL 5 MG PO TABS
75.0000 mg | ORAL_TABLET | Freq: Every day | ORAL | Status: DC
  Administered 2013-07-08 – 2013-07-09 (×2): 75 mg via ORAL
  Filled 2013-07-07: qty 1
  Filled 2013-07-07: qty 7

## 2013-07-07 MED ORDER — GLIPIZIDE ER 5 MG PO TB24
5.0000 mg | ORAL_TABLET | Freq: Every day | ORAL | Status: DC
  Administered 2013-07-08 – 2013-07-09 (×2): 5 mg via ORAL
  Filled 2013-07-07 (×3): qty 1

## 2013-07-07 MED ORDER — INSULIN ASPART 100 UNIT/ML ~~LOC~~ SOLN: 0.0000 [IU] | Freq: Every day | SUBCUTANEOUS | Status: DC

## 2013-07-07 MED ORDER — LOSARTAN POTASSIUM 50 MG PO TABS
100.0000 mg | ORAL_TABLET | Freq: Every day | ORAL | Status: DC
  Filled 2013-07-07: qty 2

## 2013-07-07 MED ORDER — SERTRALINE HCL 50 MG PO TABS
150.0000 mg | ORAL_TABLET | Freq: Every day | ORAL | Status: DC
  Administered 2013-07-07 – 2013-07-09 (×3): 150 mg via ORAL
  Filled 2013-07-07: qty 3
  Filled 2013-07-07 (×2): qty 1

## 2013-07-07 MED ORDER — ALBUTEROL SULFATE (5 MG/ML) 0.5% IN NEBU
5.0000 mg | INHALATION_SOLUTION | Freq: Once | RESPIRATORY_TRACT | Status: AC
  Administered 2013-07-07: 5 mg via RESPIRATORY_TRACT
  Filled 2013-07-07: qty 1

## 2013-07-07 MED ORDER — TRAMADOL HCL 50 MG PO TABS: 50.0000 mg | ORAL_TABLET | Freq: Four times a day (QID) | ORAL | Status: DC | PRN

## 2013-07-07 MED ORDER — B COMPLEX-C PO TABS
1.0000 | ORAL_TABLET | Freq: Every day | ORAL | Status: DC
  Administered 2013-07-08 – 2013-07-09 (×2): 1 via ORAL
  Filled 2013-07-07 (×2): qty 1

## 2013-07-07 MED ORDER — LORATADINE 10 MG PO TABS
10.0000 mg | ORAL_TABLET | Freq: Every day | ORAL | Status: DC
  Administered 2013-07-08 – 2013-07-09 (×2): 10 mg via ORAL
  Filled 2013-07-07 (×2): qty 1

## 2013-07-07 MED ORDER — LEVOTHYROXINE SODIUM 75 MCG PO TABS
75.0000 ug | ORAL_TABLET | Freq: Every day | ORAL | Status: DC
  Administered 2013-07-08 – 2013-07-09 (×2): 75 ug via ORAL
  Filled 2013-07-07 (×3): qty 1

## 2013-07-07 MED ORDER — FUROSEMIDE 40 MG PO TABS
40.0000 mg | ORAL_TABLET | Freq: Three times a day (TID) | ORAL | Status: DC
  Administered 2013-07-07: 40 mg via ORAL
  Filled 2013-07-07: qty 1
  Filled 2013-07-07: qty 2
  Filled 2013-07-07 (×3): qty 1

## 2013-07-07 MED ORDER — METHYLPHENIDATE HCL 20 MG PO TABS: 20.0000 mg | ORAL_TABLET | Freq: Two times a day (BID) | ORAL | Status: DC

## 2013-07-07 MED ORDER — ENOXAPARIN SODIUM 40 MG/0.4ML ~~LOC~~ SOLN
40.0000 mg | SUBCUTANEOUS | Status: DC
  Administered 2013-07-08 (×2): 40 mg via SUBCUTANEOUS
  Filled 2013-07-07 (×4): qty 0.4

## 2013-07-07 MED ORDER — LEVOFLOXACIN IN D5W 750 MG/150ML IV SOLN
750.0000 mg | Freq: Once | INTRAVENOUS | Status: AC
  Administered 2013-07-07: 750 mg via INTRAVENOUS
  Filled 2013-07-07: qty 150

## 2013-07-07 MED ORDER — PROCHLORPERAZINE MALEATE 10 MG PO TABS
10.0000 mg | ORAL_TABLET | Freq: Four times a day (QID) | ORAL | Status: DC | PRN
  Filled 2013-07-07: qty 1

## 2013-07-07 MED ORDER — POTASSIUM CHLORIDE CRYS ER 20 MEQ PO TBCR
20.0000 meq | EXTENDED_RELEASE_TABLET | Freq: Two times a day (BID) | ORAL | Status: DC
  Administered 2013-07-08 – 2013-07-09 (×4): 20 meq via ORAL
  Filled 2013-07-07 (×5): qty 1

## 2013-07-07 MED ORDER — IPRATROPIUM BROMIDE 0.02 % IN SOLN
0.5000 mg | Freq: Once | RESPIRATORY_TRACT | Status: AC
  Administered 2013-07-07: 0.5 mg via RESPIRATORY_TRACT
  Filled 2013-07-07: qty 2.5

## 2013-07-07 MED ORDER — IOHEXOL 350 MG/ML SOLN
100.0000 mL | Freq: Once | INTRAVENOUS | Status: AC | PRN
  Administered 2013-07-07: 100 mL via INTRAVENOUS

## 2013-07-07 MED ORDER — VITAMIN D3 25 MCG (1000 UNIT) PO TABS
2000.0000 [IU] | ORAL_TABLET | Freq: Every day | ORAL | Status: DC
  Administered 2013-07-08 – 2013-07-09 (×2): 2000 [IU] via ORAL
  Filled 2013-07-07 (×2): qty 2

## 2013-07-07 MED ORDER — PANTOPRAZOLE SODIUM 20 MG PO TBEC
20.0000 mg | DELAYED_RELEASE_TABLET | Freq: Every day | ORAL | Status: DC
  Administered 2013-07-08 – 2013-07-09 (×2): 20 mg via ORAL
  Filled 2013-07-07 (×2): qty 1

## 2013-07-07 MED ORDER — NALOXONE HCL 0.4 MG/ML IJ SOLN
0.4000 mg | Freq: Once | INTRAMUSCULAR | Status: AC
  Administered 2013-07-07: 0.4 mg via INTRAVENOUS
  Filled 2013-07-07: qty 1

## 2013-07-07 MED ORDER — SODIUM CHLORIDE 0.9 % IJ SOLN
3.0000 mL | Freq: Two times a day (BID) | INTRAMUSCULAR | Status: DC
  Administered 2013-07-08 – 2013-07-09 (×4): 3 mL via INTRAVENOUS

## 2013-07-07 MED ORDER — INSULIN ASPART 100 UNIT/ML ~~LOC~~ SOLN: 0.0000 [IU] | Freq: Three times a day (TID) | SUBCUTANEOUS | Status: DC

## 2013-07-07 NOTE — H&P (Signed)
[facility] History and Physical  [name] ZOX:[id number] DOB: May 08, 1956 DOA: 07/07/2013  Referring physician: Emergency Department PCP: [name], MD  Specialists:   Chief Complaint: Respiratory Failure  HPI: [name] is a 57 y.o. female  With a hx of OSA, noncompliant with CPAP, morbid obesity, O2 dependence, and chronic pain for which she is taking methadone daily. The patient reportedly forgot to wear her usual O2 overnight and upon awakening this morning, elected to take an additional 10-15mg  of methadone over her normal 75mg  daily methadone (confirmed with her pain clinic). The patient was then found unresponsive and brought to the ED where initial sats were found to be in the 70's. CXR was largely unremarkable and a CT chest was neg for PE. The patient was given narcan with rapid improvement. The patient's o2 requirement improved and the hospitalist service was consulted for possible admission.  Review of Systems:  Diffuse joint pains, no chest pain, no sob, remainder of 10 pt review of systems reviewed and are negative  Past Medical History  Diagnosis Date  . OSA (obstructive sleep apnea)   . CHF (congestive heart failure)   . Diabetes mellitus   . Altered mental status     2nd to hypercapnea  . RLS (restless legs syndrome)   . Acute and chronic respiratory failure with hypercapnia   . Hypoxemia   . Endometrial cancer   . Morbid obesity   . Depression   . Hypothyroidism   . GERD (gastroesophageal reflux disease)   . Nephrolithiasis   . Chronic low back pain   . ADD (attention deficit disorder)   . Restless leg syndrome   . Coronary artery disease    Past Surgical History  Procedure Laterality Date  . Back surgery      x2  . Shoulder surgery      x2  . Vaginal hysterectomy      x2  . Dental surgery    . Tonsillectomy    . Sp perc nephrostomy     Social History:  reports that she has quit smoking. She does not have any smokeless tobacco  history on file. She reports that she does not drink alcohol or use illicit drugs.  where does patient live--home, ALF, SNF? and with whom if at home?  Can patient participate in ADLs?  Allergies  Allergen Reactions  . Aspirin Other (See Comments)    Chest pain  . Erythromycin Nausea And Vomiting  . Nsaids Other (See Comments)    Chest pain  . Sulfamethoxazole W-Trimethoprim Other (See Comments)    Caused patient to have diarrhea     Family History  Problem Relation Age of Onset  . Cancer Father     (be sure to complete)  Prior to Admission medications   Medication Sig Start Date End Date Taking? Authorizing Provider  b complex vitamins tablet Take 1 tablet by mouth daily.     Yes Historical Provider, MD  cetirizine (ZYRTEC) 10 MG tablet Take 10 mg by mouth daily.   Yes Historical Provider, MD  Cholecalciferol (VITAMIN D3) 2000 UNITS TABS Take 1 tablet by mouth daily.     Yes Historical Provider, MD  furosemide (LASIX) 40 MG tablet Take 40 mg by mouth 3 (three) times daily.    Yes Historical Provider, MD  glipiZIDE (GLUCOTROL XL) 5 MG 24 hr tablet Take 5 mg by mouth daily.   Yes Historical Provider, MD  lansoprazole (PREVACID) 30 MG capsule Take 30 mg by  mouth daily.   Yes Historical Provider, MD  levothyroxine (SYNTHROID, LEVOTHROID) 75 MCG tablet Take 75 mcg by mouth daily.     Yes Historical Provider, MD  losartan (COZAAR) 50 MG tablet Take 100 mg by mouth daily.    Yes Historical Provider, MD  METHADONE HCL PO 60 mg. 55 mg daily   Yes Historical Provider, MD  methylphenidate (RITALIN) 20 MG tablet as needed. 20 mg in the am and 10 mg at noon   Yes Historical Provider, MD  potassium chloride SA (K-DUR,KLOR-CON) 20 MEQ tablet Take 20 mEq by mouth 2 (two) times daily.     Yes Historical Provider, MD  prochlorperazine (COMPAZINE) 10 MG tablet Take 10 mg by mouth every 6 (six) hours as needed (neasea).   Yes Historical Provider, MD  propranolol (INDERAL) 40 MG tablet 1/2 tablet every  morning and 1 tablet at night   Yes Historical Provider, MD  sertraline (ZOLOFT) 100 MG tablet Take 150 mg by mouth daily.    Yes Historical Provider, MD  tolnaftate (TINACTIN) 1 % external solution Apply 1 application topically at bedtime.   Yes Historical Provider, MD   Physical Exam: Filed Vitals:   07/07/13 1600 07/07/13 1700 07/07/13 1715 07/07/13 1730  BP:  120/59 116/47 124/51  Pulse: 66 81 77 76  Temp:      TempSrc:      Resp: 23 12 20 17   SpO2: 91% 94% 92% 93%     General:  Awake, in nad  Eyes: PERRL B  ENT: membranes moist, dentition fair  Neck: trachea midline, neck supple  Cardiovascular: regular, s1, s2  Respiratory: normal resp effort, no wheezing  Abdomen: soft, morbidly obese, nondistended  Skin: no abnormal skin lesions seen, normal skin turgor  Musculoskeletal: no clubbing or cyanosis, perfused  Psychiatric: mood and affect normal, no auditory/visual hallucinations  Neurologic: cn2-12 grossly intact, strength/sensation intact  Labs on Admission:  Basic Metabolic Panel:  Recent Labs Lab 07/07/13 1306  NA 141  K 5.4*  CL 102  CO2 30  GLUCOSE 149*  BUN 17  CREATININE 1.05  CALCIUM 8.9   Liver Function Tests: No results found for this basename: AST, ALT, ALKPHOS, BILITOT, PROT, ALBUMIN,  in the last 168 hours No results found for this basename: LIPASE, AMYLASE,  in the last 168 hours No results found for this basename: AMMONIA,  in the last 168 hours CBC:  Recent Labs Lab 07/07/13 1306  WBC 10.3  HGB 11.8*  HCT 40.3  MCV 87.8  PLT 219   Cardiac Enzymes:  Recent Labs Lab 07/07/13 1307  TROPONINI <0.30    BNP (last 3 results)  Recent Labs  07/07/13 1307  PROBNP 1028.0*   CBG: No results found for this basename: GLUCAP,  in the last 168 hours  Radiological Exams on Admission: Dg Chest 2 View  07/07/2013   *RADIOLOGY REPORT*  Clinical Data: Shortness of breath.  Former smoker with current history of sleep apnea and  prior history of CHF.  CHEST - 2 VIEW  Comparison: Portable chest x-ray 10/10/2011, 10/08/2011.  Two-view chest x-ray 11/07/2006.  Findings: Markedly suboptimal inspiration on the AP image, with only minimally improved aeration on the lateral image.  Taking this into account, cardiac silhouette upper normal in size.  Apparent right paratracheal opacity on the AP image is felt to be due to rotation.  Prominent bronchovascular markings diffusely and moderate central peribronchial thickening, more so than on the prior examinations.  No confluent airspace consolidation.  Mild pulmonary venous hypertension without overt edema.  No pleural effusions.  Degenerative changes involving the thoracic spine.  IMPRESSION: Suboptimal inspiration.  Moderate changes of acute bronchitis and/or asthma without localized airspace pneumonia.   Original Report Authenticated By: Hulan Saas, M.D.   Ct Angio Chest Pe W/cm &/or Wo Cm  07/07/2013   *RADIOLOGY REPORT*  Clinical Data: Hypoxia  CT ANGIOGRAPHY CHEST  Technique:  Multidetector CT imaging of the chest using the standard protocol during bolus administration of intravenous contrast. Multiplanar reconstructed images including MIPs were obtained and reviewed to evaluate the vascular anatomy.  Contrast: OMNIPAQUE IOHEXOL 350 MG/ML SOLN  Comparison: Chest radiograph July 07, 2013  Findings:  There is no demonstrable pulmonary embolus.  There is no thoracic aortic aneurysm or dissection.  There is patchy airspace consolidation in both lower lobes.  There is mild atelectatic change in the inferior lingula.  There is subtle infiltrate in the posterior segment right upper lobe at the level of the minor fissure.  There are several small lymph nodes in the right perihilar region. By size criteria, there is no frank adenopathy.  There are multiple foci of coronary artery calcification. Pericardium is not thickened.  There is a small hiatal hernia.  Visualized upper abdominal  structures appear normal.  There are no blastic or lytic bone lesions.  There is degenerative change in the thoracic spine.  Visualized thyroid appears normal.  IMPRESSION: Areas of airspace consolidation in both lower lobes. Smaller area of airspace consolidation in the posterior segment right upper lobe.  Mild atelectasis in the inferior lingula.  Small hiatal hernia.  No demonstrable pulmonary embolus.  Multiple foci of coronary artery calcification.   Original Report Authenticated By: Bretta Bang, M.D.    Assessment/Plan Principal Problem:   Narcotic overdose Active Problems:   OSA (obstructive sleep apnea)   Hypercapnic respiratory failure, chronic   Hypoxia   Diabetes mellitus   HTN (hypertension)   Chronic pain  Hypoxia: - Possibly secondary to combination of not using home o2, not using CPAP, and narcotic overdose - O2 sats improved s/p narcan - Will monitor for now - Cont O2 as needed - Avoid oversedation - Admit to med/tele, inpt  Diabetes: - Diabetic diet - Cont current regimen - Will add SSI coverage  HTN: - BP stable and controlled - Cont meds for now  Chronic Pain: - Confirmed with pt's pain clinic, [facility] in [place], that pt is on 75mg  daily methadone, last dose was given today, next dose due tomorrow - As above, avoid oversedation  OSA: - Will order nocternal CPAP - Pt has CPAP at home but admits to noncompliance  DVT prophylaxis: - Cont with lovenox subQ  Code Status: Full (must indicate code status--if unknown or must be presumed, indicate so) Family Communication: Pt in room (indicate person spoken with, if applicable, with phone number if by telephone) Disposition Plan: Pending (indicate anticipated LOS)  Time spent:  Jalicia Roszak K [facility] Pager [phone]  If 7PM-7AM, please contact night-coverage [email or web address] Password [email or web address] 07/07/2013, 6:32 PM

## 2013-07-07 NOTE — ED Provider Notes (Signed)
  This was a shared visit with a mid-level provided (NP or PA).  Throughout the patient's course I was available for consultation/collaboration.  I saw the ECG (if appropriate), relevant labs and studies - I agree with the interpretation.  On my exam the patient was noticeably dyspneic w tachypnea. She awoke, and improved following Narcan. She was admitted for further E/M.       Gerhard Munch, MD 07/07/13 [phone]

## 2013-07-07 NOTE — ED Notes (Signed)
Pt reports to the ED for eval of generalized weakness and malaise upon awakening at 6 am. Pt did not wear her home O2 last pm. Pt woke up and took a little bit extra of her prescribed Methadone and went back to sleep. Granddaughter went in at approx 1130 and went in and attempted to wake the pt up without success. She placed her on her home O2 and pt was still unarousable. Initial O2 saturation upon fire arrivakl was 76% pt placed on NRB and O2 saturation became 100%. Pt A&Ox4 upon arrival to the ED. Pt denies any CP at this time. Pt O2 85% on RA. Pt placed on 3L nasal cannula and O2 91%.

## 2013-07-07 NOTE — ED Provider Notes (Signed)
CSN: [id number]     Arrival date & time 07/07/13  1231 History     First MD Initiated Contact with Patient 07/07/13 1232     Chief Complaint  Patient presents with  . Shortness of Breath   (Consider location/radiation/quality/duration/timing/severity/associated sxs/prior Treatment) HPI Comments: 57 year old female with an extensive past medical history including CHF, diabetes, morbid obesity, hypothyroidism, CAD and restless leg syndrome presents to the emergency department via EMS complaining of generalized weakness, shortness of breath and fatigue when she woke up around 6:00 this morning. Patient is on 24-hour oxygen, however did not wear her oxygen throughout the night last night because "she forgot". States she woke up around 6:00 this morning to take extra of her prescribed methadone because she forgot to take it 3 days back. Around 11:30 this morning, about half an hour prior to EMS getting to her home, patient's granddaughter went into check on her and try to wake her up and was not able to. Granddaughter then put the oxygen on the patient and she was still unarousable. EMS was then called. When fire truck arrived, O2 sat on room air was 76%, was placed on nonrebreather and began satting at 100%. Currently patient is denying any complaints, denies shortness of breath, weakness, fatigue, confusion, cough, chest pain. States she has taken all of her prescribed medications today. Patient is a poor historian.  Patient is a 57 y.o. female presenting with shortness of breath. The history is provided by the patient and the EMS personnel.  Shortness of Breath Associated symptoms: no chest pain and no cough     Past Medical History  Diagnosis Date  . OSA (obstructive sleep apnea)   . CHF (congestive heart failure)   . Diabetes mellitus   . Altered mental status     2nd to hypercapnea  . RLS (restless legs syndrome)   . Acute and chronic respiratory failure with hypercapnia   . Hypoxemia    . Endometrial cancer   . Morbid obesity   . Depression   . Hypothyroidism   . GERD (gastroesophageal reflux disease)   . Nephrolithiasis   . Chronic low back pain   . ADD (attention deficit disorder)   . Restless leg syndrome   . Coronary artery disease    Past Surgical History  Procedure Laterality Date  . Back surgery      x2  . Shoulder surgery      x2  . Vaginal hysterectomy      x2  . Dental surgery    . Tonsillectomy    . Sp perc nephrostomy     Family History  Problem Relation Age of Onset  . Cancer Father    History  Substance Use Topics  . Smoking status: Former Smoker -- 0.50 packs/day for 2 years  . Smokeless tobacco: Not on file     Comment: quit in teenage years  . Alcohol Use: No   OB History   Grav Para Term Preterm Abortions TAB SAB Ect Mult Living                 Review of Systems  Constitutional: Positive for fatigue.  Respiratory: Positive for shortness of breath. Negative for cough.   Cardiovascular: Negative for chest pain and leg swelling.  Neurological: Positive for weakness.  All other systems reviewed and are negative.    Allergies  Aspirin; Erythromycin; Nsaids; and Sulfamethoxazole w-trimethoprim  Home Medications   Current Outpatient Rx  Name  Route  Sig  Dispense  Refill  . azithromycin (ZITHROMAX Z-PAK) 250 MG tablet      Take as directed on pack   6 tablet   0   . b complex vitamins tablet   Oral   Take 1 tablet by mouth daily.           . Cholecalciferol (VITAMIN D3) 2000 UNITS TABS   Oral   Take 1 tablet by mouth daily.           . furosemide (LASIX) 40 MG tablet   Oral   Take 40 mg by mouth 3 (three) times daily.          Marland Kitchen glipiZIDE (GLUCOTROL XL) 5 MG 24 hr tablet   Oral   Take 5 mg by mouth daily.         . lansoprazole (PREVACID) 30 MG capsule   Oral   Take 30 mg by mouth daily.         Marland Kitchen levothyroxine (SYNTHROID, LEVOTHROID) 75 MCG tablet   Oral   Take 75 mcg by mouth daily.            Marland Kitchen losartan (COZAAR) 50 MG tablet   Oral   Take 100 mg by mouth daily.          Marland Kitchen METHADONE HCL PO      60 mg. 55 mg daily         . methylphenidate (RITALIN) 20 MG tablet      as needed. 20 mg in the am and 10 mg at noon         . potassium chloride SA (K-DUR,KLOR-CON) 20 MEQ tablet   Oral   Take 20 mEq by mouth 2 (two) times daily.           . propranolol (INDERAL) 40 MG tablet      1/2 tablet every morning and 1 tablet at night         . sertraline (ZOLOFT) 100 MG tablet   Oral   Take 150 mg by mouth daily.           BP 126/55  Pulse 82  Temp(Src) 98.3 F (36.8 C) (Oral)  Resp 20  SpO2 92% Physical Exam  Nursing note and vitals reviewed. Constitutional: She is oriented to person, place, and time. She appears well-developed. She appears lethargic. No distress. Nasal cannula in place.  Morbidly obese.  HENT:  Head: Normocephalic and atraumatic.  Mouth/Throat: Oropharynx is clear and moist.  Eyes: Conjunctivae and EOM are normal. Pupils are equal, round, and reactive to light. No scleral icterus.  Neck: Normal range of motion. Neck supple. No JVD present. No tracheal deviation present.  Cardiovascular: Normal rate, regular rhythm, normal heart sounds and intact distal pulses.   +2 pitting edema LE bilateral.  Pulmonary/Chest: Tachypnea noted. No respiratory distress. She has wheezes (faint, expiratory, scattered). She has no rhonchi. She has no rales.  Abdominal: Soft. Bowel sounds are normal. She exhibits no distension. There is no tenderness.  Musculoskeletal: Normal range of motion. She exhibits no edema.  Neurological: She is oriented to person, place, and time. She has normal strength. She appears lethargic. No sensory deficit. GCS eye subscore is 2. GCS verbal subscore is 5. GCS motor subscore is 6.  Skin: Skin is warm and dry. She is not diaphoretic.  Psychiatric: She has a normal mood and affect. Her behavior is normal.    ED Course    Procedures (including critical care time)  Labs Reviewed  CBC  BASIC METABOLIC PANEL  PRO B NATRIURETIC PEPTIDE   Date: 07/07/2013  Rate: 72  Rhythm: normal sinus rhythm  QRS Axis: normal  Intervals: normal  ST/T Wave abnormalities: nonspecific T wave changes  Conduction Disutrbances:none  Narrative Interpretation: no stemi  Old EKG Reviewed: unchanged   Dg Chest 2 View  07/07/2013   *RADIOLOGY REPORT*  Clinical Data: Shortness of breath.  Former smoker with current history of sleep apnea and prior history of CHF.  CHEST - 2 VIEW  Comparison: Portable chest x-ray 10/10/2011, 10/08/2011.  Two-view chest x-ray 11/07/2006.  Findings: Markedly suboptimal inspiration on the AP image, with only minimally improved aeration on the lateral image.  Taking this into account, cardiac silhouette upper normal in size.  Apparent right paratracheal opacity on the AP image is felt to be due to rotation.  Prominent bronchovascular markings diffusely and moderate central peribronchial thickening, more so than on the prior examinations.  No confluent airspace consolidation.  Mild pulmonary venous hypertension without overt edema.  No pleural effusions.  Degenerative changes involving the thoracic spine.  IMPRESSION: Suboptimal inspiration.  Moderate changes of acute bronchitis and/or asthma without localized airspace pneumonia.   Original Report Authenticated By: Hulan Saas, M.D.   Ct Angio Chest Pe W/cm &/or Wo Cm  07/07/2013   *RADIOLOGY REPORT*  Clinical Data: Hypoxia  CT ANGIOGRAPHY CHEST  Technique:  Multidetector CT imaging of the chest using the standard protocol during bolus administration of intravenous contrast. Multiplanar reconstructed images including MIPs were obtained and reviewed to evaluate the vascular anatomy.  Contrast: OMNIPAQUE IOHEXOL 350 MG/ML SOLN  Comparison: Chest radiograph July 07, 2013  Findings:  There is no demonstrable pulmonary embolus.  There is no thoracic aortic  aneurysm or dissection.  There is patchy airspace consolidation in both lower lobes.  There is mild atelectatic change in the inferior lingula.  There is subtle infiltrate in the posterior segment right upper lobe at the level of the minor fissure.  There are several small lymph nodes in the right perihilar region. By size criteria, there is no frank adenopathy.  There are multiple foci of coronary artery calcification. Pericardium is not thickened.  There is a small hiatal hernia.  Visualized upper abdominal structures appear normal.  There are no blastic or lytic bone lesions.  There is degenerative change in the thoracic spine.  Visualized thyroid appears normal.  IMPRESSION: Areas of airspace consolidation in both lower lobes. Smaller area of airspace consolidation in the posterior segment right upper lobe.  Mild atelectasis in the inferior lingula.  Small hiatal hernia.  No demonstrable pulmonary embolus.  Multiple foci of coronary artery calcification.   Original Report Authenticated By: Bretta Bang, M.D.   1. Community acquired pneumonia     MDM  Patient with SOB, weakness, fatigue, currently stating asymptomatic. O2 sat 95% on 3L O2. Known history of heart failure, no rales auscultated. Scattered faint wheezes. Duoneb treatment. Labs- cbc, bmp, bnp, troponin. CXR, EKG. 2:05 PM O2 sat 89% on 3L. Inreased to 4L, O2 sat 92%. Appears lethargic. Took extra methadone earlier this morning. Will give 0.4mg  narcan. Patient discussed with Dr. Jeraldine Loots who agrees with plan of care. 3:16 PM Patient aroused by Narcan. Agitated, 1mg  ativan given. O2 sat 98% on 4L. Patient will be admitted for hypoxemia, CT angio pending to r/o PE. BNP 1028, prior 4471. CXR with bronchitic changes, no leukocytosis. 5:17 PM CTA without PE, however bilateral infiltrates. IV levaquin. admission accepted by Dr. Rhona Leavens, [facility].  Trevor Mace, PA-C 07/07/13 1718

## 2013-07-08 ENCOUNTER — Encounter (HOSPITAL_COMMUNITY): Payer: Self-pay | Admitting: General Practice

## 2013-07-08 DIAGNOSIS — J189 Pneumonia, unspecified organism: Secondary | ICD-10-CM

## 2013-07-08 LAB — GLUCOSE, CAPILLARY
Glucose-Capillary: 72 mg/dL (ref 70–99)
Glucose-Capillary: 69 mg/dL — ABNORMAL LOW (ref 70–99)
Glucose-Capillary: 95 mg/dL (ref 70–99)

## 2013-07-08 LAB — CBC
MCH: 26.1 pg (ref 26.0–34.0)
MCHC: 29.9 g/dL — ABNORMAL LOW (ref 30.0–36.0)
MCV: 87.4 fL (ref 78.0–100.0)
Platelets: 209 10*3/uL (ref 150–400)
RBC: 4.29 MIL/uL (ref 3.87–5.11)

## 2013-07-08 LAB — COMPREHENSIVE METABOLIC PANEL
ALT: 9 U/L (ref 0–35)
AST: 15 U/L (ref 0–37)
Albumin: 2.8 g/dL — ABNORMAL LOW (ref 3.5–5.2)
CO2: 34 mEq/L — ABNORMAL HIGH (ref 19–32)
Chloride: 102 mEq/L (ref 96–112)
GFR calc non Af Amer: 56 mL/min — ABNORMAL LOW (ref >90)
Potassium: 4 mEq/L (ref 3.5–5.1)
Sodium: 141 mEq/L (ref 135–145)
Total Bilirubin: 0.2 mg/dL — ABNORMAL LOW (ref 0.3–1.2)

## 2013-07-08 LAB — TROPONIN I: Troponin I: <0.3 ng/mL (ref <0.30)

## 2013-07-08 LAB — TSH: TSH: 1.391 u[IU]/mL (ref 0.350–4.500)

## 2013-07-08 LAB — MRSA PCR SCREENING: MRSA by PCR: POSITIVE — AB

## 2013-07-08 MED ORDER — METHYLPHENIDATE HCL 5 MG PO TABS
20.0000 mg | ORAL_TABLET | Freq: Two times a day (BID) | ORAL | Status: DC
  Administered 2013-07-08 – 2013-07-09 (×2): 20 mg via ORAL
  Filled 2013-07-08 (×2): qty 4

## 2013-07-08 MED ORDER — CHLORHEXIDINE GLUCONATE CLOTH 2 % EX PADS
6.0000 | MEDICATED_PAD | Freq: Every day | CUTANEOUS | Status: DC
  Administered 2013-07-09: 6 via TOPICAL

## 2013-07-08 MED ORDER — MUPIROCIN 2 % EX OINT
1.0000 "application " | TOPICAL_OINTMENT | Freq: Two times a day (BID) | CUTANEOUS | Status: DC
  Administered 2013-07-08 – 2013-07-09 (×3): 1 via NASAL
  Filled 2013-07-08: qty 22

## 2013-07-08 MED ORDER — FUROSEMIDE 10 MG/ML IJ SOLN
40.0000 mg | Freq: Three times a day (TID) | INTRAMUSCULAR | Status: DC
  Administered 2013-07-08 – 2013-07-09 (×4): 40 mg via INTRAVENOUS
  Filled 2013-07-08 (×5): qty 4

## 2013-07-08 NOTE — Progress Notes (Addendum)
[facility] PROGRESS NOTE  [name] ZOX:[id number] DOB: Nov 16, 1956 DOA: 07/07/2013 PCP: [name], MD  Assessment/Plan: Principal Problem:   Narcotic overdose Active Problems:   OSA (obstructive sleep apnea)   Hypercapnic respiratory failure, chronic   Hypoxia   Diabetes mellitus   HTN (hypertension)   Chronic pain     Hypoxia:  - Possibly secondary to combination of not using home o2, not using CPAP, and narcotic overdose  - O2 sats improved s/p narcan  - Will monitor for now  - Cont O2 as needed  - Avoid oversedation  Dc prochlorperazine, Ultram diuresise with iv lasix   Diabetes:  - Diabetic diet  - Cont current regimen  - Will add SSI coverage   HTN:  - BP stable and controlled  Discontinue lorsartan  Chronic Pain:  - Confirmed with pt's pain clinic, [facility] in [place], that pt is on 75mg  daily methadone, last dose was given today, next dose due tomorrow  - As above, avoid oversedation   OSA:  - Will order nocturnal CPAP  - Pt has CPAP at home but admits to noncompliance      Code Status: full Family Communication: family updated about patient's clinical progress Disposition Plan: Likely DC home tomorrow if stable today   Brief narrative: With a hx of OSA, noncompliant with CPAP, morbid obesity, O2 dependence, and chronic pain for which she is taking methadone daily. The patient reportedly forgot to wear her usual O2 overnight and upon awakening this morning, elected to take an additional 10-15mg  of methadone over her normal 75mg  daily methadone (confirmed with her pain clinic). The patient was then found unresponsive and brought to the ED where initial sats were found to be in the 70's. CXR was largely unremarkable and a CT chest was neg for PE. The patient was given narcan with rapid improvement. The patient's o2 requirement improved and the hospitalist service was consulted for possible  admission.   Consultants:  None  Procedures:  None  Antibiotics:  None  HPI/Subjective: Hypoxic and requiring 4 L of oxygen., Soft blood pressure  Objective: Filed Vitals:   07/07/13 2130 07/07/13 2145 07/07/13 2217 07/08/13 0426  BP: 92/51 97/54 135/77 133/80  Pulse: 54 54 66 50  Temp:   99.3 F (37.4 C) 97.8 F (36.6 C)  TempSrc:   Oral Oral  Resp: 20 17 18 17   Weight:    134.945 kg (297 lb 8 oz)  SpO2: 96% 95% 99% 100%    Intake/Output Summary (Last 24 hours) at 07/08/13 8119 Last data filed at 07/08/13 0703  Gross per 24 hour  Intake    243 ml  Output    550 ml  Net   -307 ml    Exam:  General: Awake, in nad  Eyes: PERRL B  ENT: membranes moist, dentition fair  Neck: trachea midline, neck supple  Cardiovascular: regular, s1, s2  Respiratory: normal resp effort, no wheezing  Abdomen: soft, morbidly obese, nondistended  Skin: no abnormal skin lesions seen, normal skin turgor  Musculoskeletal: no clubbing or cyanosis, perfused  Psychiatric: mood and affect normal, no auditory/visual hallucinations  Neurologic: cn2-12 grossly intact, strength/sensation intact      Data Reviewed: Basic Metabolic Panel:  Recent Labs Lab 07/07/13 1306 07/07/13 1842 07/08/13 0405  NA 141  --  141  K 5.4*  --  4.0  CL 102  --  102  CO2 30  --  34*  GLUCOSE 149*  --  78  BUN 17  --  14  CREATININE 1.05 0.97 1.06  CALCIUM 8.9  --  8.6    Liver Function Tests:  Recent Labs Lab 07/08/13 0405  AST 15  ALT 9  ALKPHOS 89  BILITOT 0.2*  PROT 6.7  ALBUMIN 2.8*   No results found for this basename: LIPASE, AMYLASE,  in the last 168 hours No results found for this basename: AMMONIA,  in the last 168 hours  CBC:  Recent Labs Lab 07/07/13 1306 07/07/13 1842 07/08/13 0405  WBC 10.3 10.0 7.7  HGB 11.8* 11.7* 11.2*  HCT 40.3 38.0 37.5  MCV 87.8 86.8 87.4  PLT 219 206 209    Cardiac Enzymes:  Recent Labs Lab 07/07/13 1307  TROPONINI <0.30   BNP  (last 3 results)  Recent Labs  07/07/13 1307  PROBNP 1028.0*     CBG:  Recent Labs Lab 07/07/13 2227 07/08/13 0404 07/08/13 0602 07/08/13 0703  GLUCAP 91 72 69* 73    No results found for this or any previous visit (from the past 240 hour(s)).   Studies: Dg Chest 2 View  07/07/2013   *RADIOLOGY REPORT*  Clinical Data: Shortness of breath.  Former smoker with current history of sleep apnea and prior history of CHF.  CHEST - 2 VIEW  Comparison: Portable chest x-ray 10/10/2011, 10/08/2011.  Two-view chest x-ray 11/07/2006.  Findings: Markedly suboptimal inspiration on the AP image, with only minimally improved aeration on the lateral image.  Taking this into account, cardiac silhouette upper normal in size.  Apparent right paratracheal opacity on the AP image is felt to be due to rotation.  Prominent bronchovascular markings diffusely and moderate central peribronchial thickening, more so than on the prior examinations.  No confluent airspace consolidation.  Mild pulmonary venous hypertension without overt edema.  No pleural effusions.  Degenerative changes involving the thoracic spine.  IMPRESSION: Suboptimal inspiration.  Moderate changes of acute bronchitis and/or asthma without localized airspace pneumonia.   Original Report Authenticated By: Hulan Saas, M.D.   Ct Angio Chest Pe W/cm &/or Wo Cm  07/07/2013   *RADIOLOGY REPORT*  Clinical Data: Hypoxia  CT ANGIOGRAPHY CHEST  Technique:  Multidetector CT imaging of the chest using the standard protocol during bolus administration of intravenous contrast. Multiplanar reconstructed images including MIPs were obtained and reviewed to evaluate the vascular anatomy.  Contrast: OMNIPAQUE IOHEXOL 350 MG/ML SOLN  Comparison: Chest radiograph July 07, 2013  Findings:  There is no demonstrable pulmonary embolus.  There is no thoracic aortic aneurysm or dissection.  There is patchy airspace consolidation in both lower lobes.  There is mild  atelectatic change in the inferior lingula.  There is subtle infiltrate in the posterior segment right upper lobe at the level of the minor fissure.  There are several small lymph nodes in the right perihilar region. By size criteria, there is no frank adenopathy.  There are multiple foci of coronary artery calcification. Pericardium is not thickened.  There is a small hiatal hernia.  Visualized upper abdominal structures appear normal.  There are no blastic or lytic bone lesions.  There is degenerative change in the thoracic spine.  Visualized thyroid appears normal.  IMPRESSION: Areas of airspace consolidation in both lower lobes. Smaller area of airspace consolidation in the posterior segment right upper lobe.  Mild atelectasis in the inferior lingula.  Small hiatal hernia.  No demonstrable pulmonary embolus.  Multiple foci of coronary artery calcification.   Original Report Authenticated By: Bretta Bang, M.D.  Scheduled Meds: . B-complex with vitamin C  1 tablet Oral Daily  . cholecalciferol  2,000 Units Oral Daily  . enoxaparin (LOVENOX) injection  40 mg Subcutaneous Q24H  . furosemide  40 mg Oral TID  . glipiZIDE  5 mg Oral Q breakfast  . insulin aspart  0-15 Units Subcutaneous TID WC  . insulin aspart  0-5 Units Subcutaneous QHS  . levothyroxine  75 mcg Oral QAC breakfast  . loratadine  10 mg Oral Daily  . losartan  100 mg Oral Daily  . methadone  75 mg Oral Daily  . methylphenidate  20 mg Oral BID WC  . pantoprazole  20 mg Oral Daily  . potassium chloride SA  20 mEq Oral BID  . propranolol  40 mg Oral BID  . sertraline  150 mg Oral Daily  . sodium chloride  3 mL Intravenous Q12H   Continuous Infusions:   Principal Problem:   Narcotic overdose Active Problems:   OSA (obstructive sleep apnea)   Hypercapnic respiratory failure, chronic   Hypoxia   Diabetes mellitus   HTN (hypertension)   Chronic pain    Time spent: 40 minutes   [facility]  [facility] Pager [phone]. If 8PM-8AM, please contact night-coverage at [email or web address], password [facility] 07/08/2013, 8:28 AM  LOS: 1 day

## 2013-07-08 NOTE — Progress Notes (Signed)
UR Completed.  [name] [phone] 07/08/2013  

## 2013-07-08 NOTE — Progress Notes (Signed)
Hypoglycemic Event  CBG: 69  Treatment: 15 GM carbohydrate snack  Symptoms: None  Follow-up CBG: Time:0703 CBG Result:73  Possible Reasons for Event: Unknown  Comments/MD notified: Progress note left for MD to view this am.    Arrie Senate R  Remember to initiate Hypoglycemia Order Set & complete

## 2013-07-08 NOTE — Progress Notes (Signed)
07/07/13 Around 2200 Pt.arrived to the unit by stretcher with oxygen at 2 L . Pt.ambulated from stretcher to bed. She is A/Ox4. She had no c/o pain or signs of distress. Wears CPAP at night. Patient brought medications with her from home, medications were sent to pharmacy. She is 1 person assist to the [facility].

## 2013-07-09 DIAGNOSIS — G4733 Obstructive sleep apnea (adult) (pediatric): Secondary | ICD-10-CM

## 2013-07-09 DIAGNOSIS — T426X4A Poisoning by other antiepileptic and sedative-hypnotic drugs, undetermined, initial encounter: Secondary | ICD-10-CM

## 2013-07-09 DIAGNOSIS — R0902 Hypoxemia: Secondary | ICD-10-CM

## 2013-07-09 DIAGNOSIS — T4271XA Poisoning by unspecified antiepileptic and sedative-hypnotic drugs, accidental (unintentional), initial encounter: Secondary | ICD-10-CM

## 2013-07-09 LAB — GLUCOSE, CAPILLARY
Glucose-Capillary: 84 mg/dL (ref 70–99)
Glucose-Capillary: 104 mg/dL — ABNORMAL HIGH (ref 70–99)

## 2013-07-09 LAB — BASIC METABOLIC PANEL
BUN: 18 mg/dL (ref 6–23)
CO2: 35 mEq/L — ABNORMAL HIGH (ref 19–32)
Chloride: 93 mEq/L — ABNORMAL LOW (ref 96–112)
Creatinine, Ser: 1.06 mg/dL (ref 0.50–1.10)
GFR calc Af Amer: 65 mL/min — ABNORMAL LOW (ref >90)
Glucose, Bld: 129 mg/dL — ABNORMAL HIGH (ref 70–99)
Potassium: 3.8 mEq/L (ref 3.5–5.1)

## 2013-07-09 NOTE — Plan of Care (Signed)
Problem: Phase I Progression Outcomes Goal: Hemodynamically stable Outcome: Progressing Continues to void without complication.  Pending discharge 7/30.  No complications with blood sugar this shift.  Will continue to monitor patient condition.

## 2013-07-09 NOTE — Plan of Care (Addendum)
Problem: Phase I Progression Outcomes Goal: Hemodynamically stable Outcome: Completed/Met Date Met:  07/09/13 Patient stable for discharge as decided by MD.  Reviewed discharge instructions with patient.  She verbalized understanding all instructions.  Medications to be retrieved from pharmacy and reviewed with patient.  No further questions or concerns.  Will be discharged as soon as transportation arrives.    Medications returned to patient at approx 2135hrs.

## 2013-07-09 NOTE — Progress Notes (Signed)
Went back to check on pt. To see if she wanted to wear her CPAP tonight. Pt. Continues to refuse CPAP. Pt. Stated that she didn't want to keep taking mask on & off to go to the restroom.

## 2013-07-09 NOTE — Progress Notes (Signed)
Pt. Stated that she would place herself on CPAP before going to bed. CPAP is set up at the pt.'s bedside with a nasal mask.

## 2013-07-09 NOTE — Discharge Summary (Signed)
Physician Discharge Summary  [name]:[id number] DOB: 07/15/1956 DOA: 07/07/2013  PCP: [name], MD  Admit date: 07/07/2013 Discharge date: 07/09/2013  Time spent: 30 minutes  Recommendations for Outpatient Follow-up:  1. Hypoxia: Secondary to not using CPAP and over sedating with methadone, tramadol. Counseled this is a combination which could result in death. Patient to use her CPAP machine for one month and then make an appointment with pulmonologist in order to make adjustments on machine  2. Diabetes: PCP to manage   3. HTN:  BP stable and controlled   4. Chronic Pain: Pain medication per [facility] in [place], that pt is on 75mg  daily methadone, l  5. OSA: See #1     Discharge Diagnoses:  Principal Problem:   Narcotic overdose Active Problems:   OSA (obstructive sleep apnea)   Hypercapnic respiratory failure, chronic   Hypoxia   Diabetes mellitus   HTN (hypertension)   Chronic pain   Discharge Condition: Stable  Diet recommendation: Diabetic  Filed Weights   07/08/13 0426 07/09/13 0620  Weight: 134.945 kg (297 lb 8 oz) 133.312 kg (293 lb 14.4 oz)    History of present illness:  With a hx of OSA, noncompliant with CPAP, morbid obesity, O2 dependence, and chronic pain for which she is taking methadone daily. The patient reportedly forgot to wear her usual O2 overnight and upon awakening this morning, elected to take an additional 10-15mg  of methadone over her normal 75mg  daily methadone (confirmed with her pain clinic). The patient was then found unresponsive and brought to the ED where initial sats were found to be in the 70's. CXR was largely unremarkable and a CT chest was neg for PE. The patient was given narcan with rapid improvement. The patient's o2 requirement improved and the hospitalist service was consulted for possible admission. TODAY patient doing much better, requested to be discharged. Patient does state that she does  not use her CPAP machine religiously and therefore has daytime hypercapnic requiring 2 L O2 via nasal cannula    Discharge Exam: Filed Vitals:   07/08/13 2132 07/09/13 0620 07/09/13 1426 07/09/13 2024  BP: 140/66 132/85 133/74 132/76  Pulse: 65 83 62 64  Temp: 98.1 F (36.7 C) 97.6 F (36.4 C) 97.6 F (36.4 C) 98.6 F (37 C)  TempSrc: Oral Oral Oral Oral  Resp: 18 18 17 20   Weight:  133.312 kg (293 lb 14.4 oz)    SpO2: 95% 95% 99% 100%    General: Alert, NAD Cardiovascular: Regular rhythm and rate, negative murmurs rubs or gallops, PT/DP pulse 2+ bilateral Respiratory: Clear to auscultation bilateral  Discharge Instructions     Medication List    ASK your doctor about these medications       b complex vitamins tablet  Take 1 tablet by mouth daily.     cetirizine 10 MG tablet  Commonly known as:  ZYRTEC  Take 10 mg by mouth at bedtime.     clotrimazole-betamethasone cream  Commonly known as:  LOTRISONE  Apply 1 application topically 2 (two) times daily as needed (apply to rashes on abdomen for itching).     furosemide 40 MG tablet  Commonly known as:  LASIX  Take 120 mg by mouth daily.     glipiZIDE 5 MG 24 hr tablet  Commonly known as:  GLUCOTROL XL  Take 5 mg by mouth daily.     lansoprazole 30 MG capsule  Commonly known as:  PREVACID  Take 30 mg  by mouth at bedtime.     levothyroxine 75 MCG tablet  Commonly known as:  SYNTHROID, LEVOTHROID  Take 75 mcg by mouth daily.     losartan 50 MG tablet  Commonly known as:  COZAAR  Take 100 mg by mouth daily.     methadone 10 MG/5ML solution  Commonly known as:  DOLOPHINE  Take 75 mg by mouth daily.     methylphenidate 20 MG tablet  Commonly known as:  RITALIN  Take 20 mg by mouth daily.     potassium chloride SA 20 MEQ tablet  Commonly known as:  K-DUR,KLOR-CON  Take 20 mEq by mouth 2 (two) times daily.     prochlorperazine 10 MG tablet  Commonly known as:  COMPAZINE  Take 10 mg by mouth every 6  (six) hours as needed (neasea).     propranolol 40 MG tablet  Commonly known as:  INDERAL  Take 40 mg by mouth 2 (two) times daily.     sertraline 100 MG tablet  Commonly known as:  ZOLOFT  Take 150 mg by mouth at bedtime.     tolnaftate 1 % external solution  Commonly known as:  TINACTIN  Apply 1 application topically 2 (two) times daily. Apply to toe nails       Allergies  Allergen Reactions  . Aspirin Other (See Comments)    Chest pain  . Erythromycin Nausea And Vomiting  . Nsaids Other (See Comments)    Chest pain  . Sulfamethoxazole W-Trimethoprim Other (See Comments)    Caused patient to have diarrhea       The results of significant diagnostics from this hospitalization (including imaging, microbiology, ancillary and laboratory) are listed below for reference.    Significant Diagnostic Studies: Dg Chest 2 View  07/07/2013   *RADIOLOGY REPORT*  Clinical Data: Shortness of breath.  Former smoker with current history of sleep apnea and prior history of CHF.  CHEST - 2 VIEW  Comparison: Portable chest x-ray 10/10/2011, 10/08/2011.  Two-view chest x-ray 11/07/2006.  Findings: Markedly suboptimal inspiration on the AP image, with only minimally improved aeration on the lateral image.  Taking this into account, cardiac silhouette upper normal in size.  Apparent right paratracheal opacity on the AP image is felt to be due to rotation.  Prominent bronchovascular markings diffusely and moderate central peribronchial thickening, more so than on the prior examinations.  No confluent airspace consolidation.  Mild pulmonary venous hypertension without overt edema.  No pleural effusions.  Degenerative changes involving the thoracic spine.  IMPRESSION: Suboptimal inspiration.  Moderate changes of acute bronchitis and/or asthma without localized airspace pneumonia.   Original Report Authenticated By: Hulan Saas, M.D.   Ct Angio Chest Pe W/cm &/or Wo Cm  07/07/2013   *RADIOLOGY REPORT*   Clinical Data: Hypoxia  CT ANGIOGRAPHY CHEST  Technique:  Multidetector CT imaging of the chest using the standard protocol during bolus administration of intravenous contrast. Multiplanar reconstructed images including MIPs were obtained and reviewed to evaluate the vascular anatomy.  Contrast: OMNIPAQUE IOHEXOL 350 MG/ML SOLN  Comparison: Chest radiograph July 07, 2013  Findings:  There is no demonstrable pulmonary embolus.  There is no thoracic aortic aneurysm or dissection.  There is patchy airspace consolidation in both lower lobes.  There is mild atelectatic change in the inferior lingula.  There is subtle infiltrate in the posterior segment right upper lobe at the level of the minor fissure.  There are several small lymph nodes in the right perihilar  region. By size criteria, there is no frank adenopathy.  There are multiple foci of coronary artery calcification. Pericardium is not thickened.  There is a small hiatal hernia.  Visualized upper abdominal structures appear normal.  There are no blastic or lytic bone lesions.  There is degenerative change in the thoracic spine.  Visualized thyroid appears normal.  IMPRESSION: Areas of airspace consolidation in both lower lobes. Smaller area of airspace consolidation in the posterior segment right upper lobe.  Mild atelectasis in the inferior lingula.  Small hiatal hernia.  No demonstrable pulmonary embolus.  Multiple foci of coronary artery calcification.   Original Report Authenticated By: Bretta Bang, M.D.    Microbiology: Recent Results (from the past 240 hour(s))  MRSA PCR SCREENING     Status: Abnormal   Collection Time    07/08/13  9:58 AM      Result Value Range Status   MRSA by PCR POSITIVE (*) NEGATIVE Final   Comment:            The GeneXpert MRSA Assay (FDA     approved for NASAL specimens     only), is one component of a     comprehensive MRSA colonization     surveillance program. It is not     intended to diagnose MRSA      infection nor to guide or     monitor treatment for     MRSA infections.     RESULT CALLED TO, READ BACK BY AND VERIFIED WITH:     MAGDABING RN 12:00 07/08/13 ([email or web address])     Labs: Basic Metabolic Panel:  Recent Labs Lab 07/07/13 1306 07/07/13 1842 07/08/13 0405 07/09/13 1045  NA 141  --  141 136  K 5.4*  --  4.0 3.8  CL 102  --  102 93*  CO2 30  --  34* 35*  GLUCOSE 149*  --  78 129*  BUN 17  --  14 18  CREATININE 1.05 0.97 1.06 1.06  CALCIUM 8.9  --  8.6 9.2   Liver Function Tests:  Recent Labs Lab 07/08/13 0405  AST 15  ALT 9  ALKPHOS 89  BILITOT 0.2*  PROT 6.7  ALBUMIN 2.8*   No results found for this basename: LIPASE, AMYLASE,  in the last 168 hours No results found for this basename: AMMONIA,  in the last 168 hours CBC:  Recent Labs Lab 07/07/13 1306 07/07/13 1842 07/08/13 0405  WBC 10.3 10.0 7.7  HGB 11.8* 11.7* 11.2*  HCT 40.3 38.0 37.5  MCV 87.8 86.8 87.4  PLT 219 206 209   Cardiac Enzymes:  Recent Labs Lab 07/07/13 1307 07/08/13 0905  TROPONINI <0.30 <0.30   BNP: BNP (last 3 results)  Recent Labs  07/07/13 1307  PROBNP 1028.0*   CBG:  Recent Labs Lab 07/08/13 1644 07/08/13 2127 07/09/13 0635 07/09/13 1153 07/09/13 1638  GLUCAP 114* 79 90 104* 84       Signed:  Annlouise Gerety, J  [facility] 07/09/2013, 8:39 PM

## 2013-10-17 ENCOUNTER — Telehealth: Payer: Self-pay | Admitting: Pulmonary Disease

## 2013-10-17 NOTE — Telephone Encounter (Signed)
I spoke with [name]. Advised her pt has not been seen since last year so weight would not be accurate. She needed nothing further

## 2014-03-06 DIAGNOSIS — M1710 Unilateral primary osteoarthritis, unspecified knee: Secondary | ICD-10-CM | POA: Diagnosis not present

## 2014-03-06 DIAGNOSIS — IMO0002 Reserved for concepts with insufficient information to code with codable children: Secondary | ICD-10-CM | POA: Diagnosis not present

## 2014-03-10 DIAGNOSIS — IMO0002 Reserved for concepts with insufficient information to code with codable children: Secondary | ICD-10-CM | POA: Diagnosis not present

## 2014-03-10 DIAGNOSIS — M1710 Unilateral primary osteoarthritis, unspecified knee: Secondary | ICD-10-CM | POA: Diagnosis not present

## 2014-04-11 ENCOUNTER — Other Ambulatory Visit: Payer: Self-pay

## 2014-04-11 ENCOUNTER — Inpatient Hospital Stay (HOSPITAL_COMMUNITY)
Admission: EM | Admit: 2014-04-11 | Discharge: 2014-04-13 | DRG: 309 | Disposition: A | Discharge status: Home / Self Care | Payer: Medicare Other | Attending: Cardiovascular Disease | Admitting: Cardiovascular Disease

## 2014-04-11 ENCOUNTER — Emergency Department (HOSPITAL_COMMUNITY): Payer: Medicare Other

## 2014-04-11 ENCOUNTER — Encounter (HOSPITAL_COMMUNITY): Payer: Self-pay | Admitting: Emergency Medicine

## 2014-04-11 DIAGNOSIS — I10 Essential (primary) hypertension: Secondary | ICD-10-CM | POA: Diagnosis present

## 2014-04-11 DIAGNOSIS — E662 Morbid (severe) obesity with alveolar hypoventilation: Secondary | ICD-10-CM | POA: Diagnosis present

## 2014-04-11 DIAGNOSIS — Z6841 Body Mass Index (BMI) 40.0 and over, adult: Secondary | ICD-10-CM | POA: Diagnosis not present

## 2014-04-11 DIAGNOSIS — G2581 Restless legs syndrome: Secondary | ICD-10-CM | POA: Diagnosis present

## 2014-04-11 DIAGNOSIS — I509 Heart failure, unspecified: Secondary | ICD-10-CM | POA: Diagnosis not present

## 2014-04-11 DIAGNOSIS — J449 Chronic obstructive pulmonary disease, unspecified: Secondary | ICD-10-CM | POA: Diagnosis not present

## 2014-04-11 DIAGNOSIS — J9610 Chronic respiratory failure, unspecified whether with hypoxia or hypercapnia: Secondary | ICD-10-CM | POA: Diagnosis not present

## 2014-04-11 DIAGNOSIS — I2510 Atherosclerotic heart disease of native coronary artery without angina pectoris: Secondary | ICD-10-CM | POA: Diagnosis not present

## 2014-04-11 DIAGNOSIS — G4733 Obstructive sleep apnea (adult) (pediatric): Secondary | ICD-10-CM | POA: Diagnosis present

## 2014-04-11 DIAGNOSIS — E039 Hypothyroidism, unspecified: Secondary | ICD-10-CM | POA: Diagnosis present

## 2014-04-11 DIAGNOSIS — R112 Nausea with vomiting, unspecified: Secondary | ICD-10-CM | POA: Diagnosis not present

## 2014-04-11 DIAGNOSIS — I4891 Unspecified atrial fibrillation: Secondary | ICD-10-CM | POA: Diagnosis not present

## 2014-04-11 DIAGNOSIS — E119 Type 2 diabetes mellitus without complications: Secondary | ICD-10-CM | POA: Diagnosis not present

## 2014-04-11 DIAGNOSIS — I252 Old myocardial infarction: Secondary | ICD-10-CM | POA: Diagnosis not present

## 2014-04-11 DIAGNOSIS — J4489 Other specified chronic obstructive pulmonary disease: Secondary | ICD-10-CM | POA: Diagnosis present

## 2014-04-11 DIAGNOSIS — I4892 Unspecified atrial flutter: Secondary | ICD-10-CM

## 2014-04-11 LAB — CBC WITH DIFFERENTIAL/PLATELET
BASOS ABS: 0 10*3/uL (ref 0.0–0.1)
BASOS PCT: 0 % (ref 0–1)
EOS PCT: 0 % (ref 0–5)
Eosinophils Absolute: 0 10*3/uL (ref 0.0–0.7)
HEMATOCRIT: 42.4 % (ref 36.0–46.0)
Hemoglobin: 12.7 g/dL (ref 12.0–15.0)
Lymphocytes Relative: 10 % — ABNORMAL LOW (ref 12–46)
Lymphs Abs: 1 10*3/uL (ref 0.7–4.0)
MCH: 25 pg — AB (ref 26.0–34.0)
MCHC: 30 g/dL (ref 30.0–36.0)
MCV: 83.6 fL (ref 78.0–100.0)
MONO ABS: 0.7 10*3/uL (ref 0.1–1.0)
Monocytes Relative: 7 % (ref 3–12)
Neutro Abs: 9.2 10*3/uL — ABNORMAL HIGH (ref 1.7–7.7)
Neutrophils Relative %: 83 % — ABNORMAL HIGH (ref 43–77)
Platelets: 211 10*3/uL (ref 150–400)
RBC: 5.07 MIL/uL (ref 3.87–5.11)
RDW: 15.9 % — AB (ref 11.5–15.5)
WBC: 11 10*3/uL — ABNORMAL HIGH (ref 4.0–10.5)

## 2014-04-11 LAB — COMPREHENSIVE METABOLIC PANEL
ALBUMIN: 3.2 g/dL — AB (ref 3.5–5.2)
ALT: 14 U/L (ref 0–35)
AST: 17 U/L (ref 0–37)
Alkaline Phosphatase: 109 U/L (ref 39–117)
BUN: 15 mg/dL (ref 6–23)
CALCIUM: 9.2 mg/dL (ref 8.4–10.5)
CO2: 30 mEq/L (ref 19–32)
CREATININE: 0.78 mg/dL (ref 0.50–1.10)
Chloride: 100 mEq/L (ref 96–112)
GFR calc Af Amer: >90 mL/min (ref >90)
GFR, EST NON AFRICAN AMERICAN: 89 mL/min — AB (ref >90)
Glucose, Bld: 89 mg/dL (ref 70–99)
Potassium: 4.6 mEq/L (ref 3.7–5.3)
Sodium: 142 mEq/L (ref 137–147)
Total Bilirubin: 0.3 mg/dL (ref 0.3–1.2)
Total Protein: 7.3 g/dL (ref 6.0–8.3)

## 2014-04-11 LAB — TROPONIN I

## 2014-04-11 MED ORDER — HEPARIN BOLUS VIA INFUSION
4500.0000 [IU] | Freq: Once | INTRAVENOUS | Status: DC
  Filled 2014-04-11: qty 4500

## 2014-04-11 MED ORDER — DILTIAZEM HCL 100 MG IV SOLR
5.0000 mg/h | Freq: Once | INTRAVENOUS | Status: AC
  Administered 2014-04-11: 5 mg/h via INTRAVENOUS

## 2014-04-11 MED ORDER — HEPARIN (PORCINE) IN NACL 100-0.45 UNIT/ML-% IJ SOLN
1200.0000 [IU]/h | INTRAMUSCULAR | Status: DC
  Administered 2014-04-11 (×2): 1200 [IU]/h via INTRAVENOUS
  Filled 2014-04-11 (×2): qty 250

## 2014-04-11 MED ORDER — DILTIAZEM HCL 25 MG/5ML IV SOLN: 20.0000 mg | Freq: Once | INTRAVENOUS | Status: DC

## 2014-04-11 NOTE — H&P (Signed)
[name] is an 59 y.o. female.   Chief Complaint: Nausea or vomiting feeling weak tired and fatigued for last 2-3 days. HPI: Patient is 58 year old female with past medical history significant for multiple medical problems i.e. coronary artery disease history of silent inferior wall myocardial infarction occluded RCA in the past, hypertension, diabetes matters, history of congestive heart failure secondary to preserved LV systolic function, obstructive sleep apnea/obesity hypoventilation syndrome on CPAP, hypothyroidism, hypercholesteremia, restless leg syndrome, remote tobacco abuse, chronic hypoxic/hyper carbic respiratory failure, degenerative joint disease, morbid obesity, came to the ER complaining of nausea with vomiting associated with feeling weak tired fatigued for last 2-3 days. Patient denies any fever or chills. Denies any palpitation lightheadedness or syncope. Denies any PND orthopnea leg swelling. Denies any chest pain. EKG done in the ER showed new onset A. fib with RVR patient received IV Cardizem bolus and was started on drip with control of heart rate between 100 and 110. Patient denies any episodes of atrial fibrillation or any cardiac arrhythmias in the past states do not feel any fluttering in the chest.  Past Medical History  Diagnosis Date  . OSA (obstructive sleep apnea)   . CHF (congestive heart failure)   . Diabetes mellitus   . Altered mental status     2nd to hypercapnea  . RLS (restless legs syndrome)   . Acute and chronic respiratory failure with hypercapnia   . Hypoxemia   . Endometrial cancer   . Morbid obesity   . Depression   . Hypothyroidism   . GERD (gastroesophageal reflux disease)   . Nephrolithiasis   . Chronic low back pain   . ADD (attention deficit disorder)   . Restless leg syndrome   . Coronary artery disease   . Complication of anesthesia     " I AM SLOW MTO WAKE UP AT ITMES "  . Hypertension   . Hypercapnia     Past Surgical History   Procedure Laterality Date  . Back surgery      x2  . Shoulder surgery      x2  . Vaginal hysterectomy      x2  . Dental surgery    . Tonsillectomy    . Sp perc nephrostomy      Family History  Problem Relation Age of Onset  . Cancer Father    Social History:  reports that she has quit smoking. She has never used smokeless tobacco. She reports that she does not drink alcohol or use illicit drugs.  Allergies:  Allergies  Allergen Reactions  . Aspirin Other (See Comments)    Chest pain  . Nsaids Other (See Comments)    Chest pain  . Other Other (See Comments)    "BP med" combined with plavix, caused syncope  . Plavix [Clopidogrel] Other (See Comments)    syncope  . Sudafed [Pseudoephedrine] Palpitations    Raised BP  . Erythromycin Nausea And Vomiting  . Sulfa Antibiotics Other (See Comments)    constipation  . Sulfamethoxazole-Trimethoprim Diarrhea     (Not in a hospital admission)  Results for orders placed during the hospital encounter of 04/11/14 (from the past 48 hour(s))  CBC WITH DIFFERENTIAL     Status: Abnormal   Collection Time    04/11/14  6:14 PM      Result Value Ref Range   WBC 11.0 (*) 4.0 - 10.5 K/uL   RBC 5.07  3.87 - 5.11 MIL/uL   Hemoglobin 12.7  12.0 -  15.0 g/dL   HCT 42.4  36.0 - 46.0 %   MCV 83.6  78.0 - 100.0 fL   MCH 25.0 (*) 26.0 - 34.0 pg   MCHC 30.0  30.0 - 36.0 g/dL   RDW 15.9 (*) 11.5 - 15.5 %   Platelets 211  150 - 400 K/uL   Neutrophils Relative % 83 (*) 43 - 77 %   Neutro Abs 9.2 (*) 1.7 - 7.7 K/uL   Lymphocytes Relative 10 (*) 12 - 46 %   Lymphs Abs 1.0  0.7 - 4.0 K/uL   Monocytes Relative 7  3 - 12 %   Monocytes Absolute 0.7  0.1 - 1.0 K/uL   Eosinophils Relative 0  0 - 5 %   Eosinophils Absolute 0.0  0.0 - 0.7 K/uL   Basophils Relative 0  0 - 1 %   Basophils Absolute 0.0  0.0 - 0.1 K/uL  COMPREHENSIVE METABOLIC PANEL     Status: Abnormal   Collection Time    04/11/14  6:14 PM      Result Value Ref Range   Sodium 142   137 - 147 mEq/L   Potassium 4.6  3.7 - 5.3 mEq/L   Chloride 100  96 - 112 mEq/L   CO2 30  19 - 32 mEq/L   Glucose, Bld 89  70 - 99 mg/dL   BUN 15  6 - 23 mg/dL   Creatinine, Ser 0.78  0.50 - 1.10 mg/dL   Calcium 9.2  8.4 - 10.5 mg/dL   Total Protein 7.3  6.0 - 8.3 g/dL   Albumin 3.2 (*) 3.5 - 5.2 g/dL   AST 17  0 - 37 U/L   ALT 14  0 - 35 U/L   Alkaline Phosphatase 109  39 - 117 U/L   Total Bilirubin 0.3  0.3 - 1.2 mg/dL   GFR calc non Af Amer 89 (*) >90 mL/min   GFR calc Af Amer >90  >90 mL/min   Comment: (NOTE)     The eGFR has been calculated using the CKD EPI equation.     This calculation has not been validated in all clinical situations.     eGFR's persistently <90 mL/min signify possible Chronic Kidney     Disease.  TROPONIN I     Status: None   Collection Time    04/11/14  6:14 PM      Result Value Ref Range   Troponin I <0.30  <0.30 ng/mL   Comment:            Due to the release kinetics of cTnI,     a negative result within the first hours     of the onset of symptoms does not rule out     myocardial infarction with certainty.     If myocardial infarction is still suspected,     repeat the test at appropriate intervals.   Dg Chest Port 1 View  04/11/2014   CLINICAL DATA:  Nausea and vomiting.  Hypertension.  EXAM: PORTABLE CHEST - 1 VIEW  COMPARISON:  07/07/2013  FINDINGS: Cardiac silhouette is normal in size. Normal mediastinal and hilar contours.  Lungs are clear.  No pleural effusion.  No pneumothorax.  The bony thorax is demineralized but intact.  IMPRESSION: No active disease.   Electronically Signed   By: Lajean Manes M.D.   On: 04/11/2014 20:25    Review of Systems  Constitutional: Positive for malaise/fatigue. Negative for fever and chills.  HENT:  Negative for hearing loss.   Eyes: Negative for double vision and photophobia.  Respiratory: Negative for cough, hemoptysis, sputum production and shortness of breath.   Cardiovascular: Negative for chest pain,  palpitations and orthopnea.  Gastrointestinal: Positive for nausea and vomiting. Negative for abdominal pain.  Genitourinary: Negative for dysuria.  Neurological: Positive for dizziness and weakness. Negative for headaches.    Blood pressure 110/87, pulse 109, temperature 98.1 F (36.7 C), temperature source Oral, resp. rate 21, height _0  (1.651 m), weight 126.554 kg (279 lb), SpO2 97.00%. Physical Exam  Constitutional: She is oriented to person, place, and time.  HENT:  Head: Normocephalic and atraumatic.  Eyes: Conjunctivae are normal. Pupils are equal, round, and reactive to light. Left eye exhibits no discharge. No scleral icterus.  Neck: Neck supple. No JVD present. No tracheal deviation present. No thyromegaly present.  Cardiovascular:  Irregularly irregular S1 and S2 soft there is soft systolic murmur no S3 gallop  Respiratory: Effort normal and breath sounds normal. No respiratory distress. She has no wheezes. She has no rales.  GI: Soft. Bowel sounds are normal. She exhibits no distension. There is no tenderness. There is no rebound.  Musculoskeletal: She exhibits no edema and no tenderness.  Neurological: She is alert and oriented to person, place, and time. No cranial nerve deficit.     Assessment/Plan New onset A. fib with RVR Hypertension CAD history of silent inferior wall MI in the past History of congestive heart failure secondary to preserved LV systolic function Diabetes mellitus Obstructive sleep apnea/obesity hypoventilation syndrome on CPAP Chronic hypoxic/hypercarbic respiratory failure COPD Remote tobacco abuse Degenerative joint disease Hypothyroidism Morbid obesity Restless leg syndrome Plan As per orders Discussed with patient regarding rate control versus rhythm control and need for long-term anticoagulation in both situations as patient has multiple risk factors and CHADS score is 3. Patient states think about TEE cardioversion.  Allegra Lai  Aliza Moret 04/11/2014, 11:12 PM

## 2014-04-11 NOTE — ED Notes (Signed)
She states today her breathing felt shallow and shes been vomiting. Her ekg shows atrial fibrillation in triage

## 2014-04-11 NOTE — ED Notes (Signed)
Per EMS-Pt reports nausea x 2 days. Pt threw up 1 time this morning. Pt states she gets nauseated because she has a breathing problem which is, tachypnea. Is on 2 L oxygen, doesn't use all the time. No Pain. No diarrhea. 122/70, HR 84, 18 RR, 179 CBG. Allergic to zofran.

## 2014-04-11 NOTE — ED Notes (Signed)
Heart rate <90 now, a fib continues.

## 2014-04-11 NOTE — ED Provider Notes (Signed)
I saw and evaluated the patient, reviewed the resident's note and I agree with the findings and plan.  Pt presents with nausea and tachycardia.  New onset a fib on EKG.  Otherwise asymptomatic.  Admitted for further treatment and evaluation.   EKG Interpretation   Date/Time:  Saturday Apr 11 2014 19:49:37 EDT Ventricular Rate:  123 PR Interval:    QRS Duration: 91 QT Interval:  334 QTC Calculation: 478 R Axis:   64 Text Interpretation:  atrial flutter with variable rate Borderline low  voltage, extremity leads new since 08 Sep 2005, EKG earlier today at 1744  showed atrial flutter as well Confirmed by Lamarco Gudiel  MD-J, Nikko Goldwire ([id number]) on  04/11/2014 7:58:32 PM        Kathalene Frames, MD 04/12/14 1531

## 2014-04-11 NOTE — ED Provider Notes (Signed)
CSN: [id number]     Arrival date & time 04/11/14  1742 History   First MD Initiated Contact with Patient 04/11/14 1935     Chief Complaint  Patient presents with  . Nausea     (Consider location/radiation/quality/duration/timing/severity/associated sxs/prior Treatment) HPI  This is a 58 y.o. female with PMH of obstructive sleep apnea, congestive heart failure, diabetes, morbid obesity, hypothyroidism, history of noncompliance with CPAP, overuse of narcotic pain medication, presenting with nausea. Onset 2 days ago, at home. Waxes and wanes. Nonbloody, nonbilious. No medications taken. Negative for pain, dizziness, weakness, numbness, tingling, dysuria, hematuria, diarrhea. Her symptoms resolved this morning. She called her PCP to report symptoms, resolution. She spoke with PCP, mentioned abrasion to her left calf. They recommended evaluation due to nausea, abrasion. In transit, patient was noticed to be in atrial fibrillation, RVR. She remained stable in transit.  Past Medical History  Diagnosis Date  . OSA (obstructive sleep apnea)   . CHF (congestive heart failure)   . Diabetes mellitus   . Altered mental status     2nd to hypercapnea  . RLS (restless legs syndrome)   . Acute and chronic respiratory failure with hypercapnia   . Hypoxemia   . Endometrial cancer   . Morbid obesity   . Depression   . Hypothyroidism   . GERD (gastroesophageal reflux disease)   . Nephrolithiasis   . Chronic low back pain   . ADD (attention deficit disorder)   . Restless leg syndrome   . Coronary artery disease   . Complication of anesthesia     " I AM SLOW MTO WAKE UP AT ITMES "  . Hypertension   . Hypercapnia    Past Surgical History  Procedure Laterality Date  . Back surgery      x2  . Shoulder surgery      x2  . Vaginal hysterectomy      x2  . Dental surgery    . Tonsillectomy    . Sp perc nephrostomy     Family History  Problem Relation Age of Onset  . Cancer Father    History   Substance Use Topics  . Smoking status: Former Smoker -- 0.50 packs/day for 2 years  . Smokeless tobacco: Never Used     Comment: quit in teenage years  . Alcohol Use: No   OB History   Grav Para Term Preterm Abortions TAB SAB Ect Mult Living                 Review of Systems  Constitutional: Negative for fever and chills.  HENT: Negative for facial swelling.   Eyes: Negative for photophobia and pain.  Respiratory: Negative for cough and shortness of breath.   Cardiovascular: Negative for chest pain and leg swelling.  Gastrointestinal: Positive for nausea (resolved) and vomiting (Resolved). Negative for abdominal pain.  Genitourinary: Negative for dysuria.  Musculoskeletal: Negative for arthralgias.  Skin: Negative for rash and wound.  Neurological: Negative for seizures.  Hematological: Negative for adenopathy.      Allergies  Aspirin; Nsaids; Other; Plavix; Sudafed; Erythromycin; Sulfa antibiotics; and Sulfamethoxazole-trimethoprim  Home Medications   Prior to Admission medications   Medication Sig Start Date End Date Taking? Authorizing Provider  b complex vitamins tablet Take 1 tablet by mouth daily.     Yes Historical Provider, MD  BIOTIN PO Take 1 tablet by mouth 2 (two) times daily.   Yes Historical Provider, MD  cetirizine (ZYRTEC) 10 MG tablet Take 10 mg  by mouth at bedtime.   Yes Historical Provider, MD  lansoprazole (PREVACID) 30 MG capsule Take 30 mg by mouth at bedtime.   Yes Historical Provider, MD  levothyroxine (SYNTHROID, LEVOTHROID) 75 MCG tablet Take 75 mcg by mouth daily.     Yes Historical Provider, MD  losartan (COZAAR) 50 MG tablet Take 50 mg by mouth daily.    Yes Historical Provider, MD  methadone (DOLOPHINE) 10 MG/5ML solution Take 70 mg by mouth every morning.    Yes Historical Provider, MD  methylphenidate (RITALIN) 20 MG tablet Take 20 mg by mouth daily.   Yes Historical Provider, MD  neomycin-bacitracin-polymyxin (NEOSPORIN) OINT Apply 1  application topically as needed for wound care.   Yes Historical Provider, MD  oxymetazoline (AFRIN) 0.05 % nasal spray Place 1 spray into both nostrils 2 (two) times daily as needed for congestion.   Yes Historical Provider, MD  propranolol (INDERAL) 40 MG tablet Take 40 mg by mouth 2 (two) times daily.   Yes Historical Provider, MD  sertraline (ZOLOFT) 100 MG tablet Take 200 mg by mouth at bedtime.    Yes Historical Provider, MD  tolnaftate (TINACTIN) 1 % external solution Apply 1 application topically 2 (two) times daily as needed (for rash). Apply to toe nails   Yes Historical Provider, MD  furosemide (LASIX) 40 MG tablet Take 120 mg by mouth daily.    Historical Provider, MD  potassium chloride SA (K-DUR,KLOR-CON) 20 MEQ tablet Take 20 mEq by mouth 2 (two) times daily.    Historical Provider, MD   BP 142/83  Pulse 120  Temp(Src) 98.1 F (36.7 C) (Oral)  Resp 22  Ht 5\' 5"  (1.651 m)  Wt 279 lb (126.554 kg)  BMI 46.43 kg/m2  SpO2 93% Physical Exam  Constitutional: She is oriented to person, place, and time. No distress.  Morbidly obese  HENT:  Head: Normocephalic and atraumatic.  Mouth/Throat: No oropharyngeal exudate.  Eyes: Conjunctivae are normal. Pupils are equal, round, and reactive to light. No scleral icterus.  Neck: Normal range of motion. No tracheal deviation present. No thyromegaly present.  Cardiovascular: Normal heart sounds.  Exam reveals no gallop and no friction rub.   No murmur heard. Tachycardic, irregular  Pulmonary/Chest: Effort normal and breath sounds normal. No stridor. No respiratory distress. She has no wheezes. She has no rales. She exhibits no tenderness.  Abdominal: Soft. She exhibits no distension and no mass. There is no tenderness. There is no rebound and no guarding.  Musculoskeletal: Normal range of motion. She exhibits no edema.  Neurological: She is alert and oriented to person, place, and time.  Skin: Skin is warm and dry. She is not diaphoretic.     ED Course  Procedures (including critical care time) Labs Review Labs Reviewed  CBC WITH DIFFERENTIAL - Abnormal; Notable for the following:    WBC 11.0 (*)    MCH 25.0 (*)    RDW 15.9 (*)    Neutrophils Relative % 83 (*)    Neutro Abs 9.2 (*)    Lymphocytes Relative 10 (*)    All other components within normal limits  COMPREHENSIVE METABOLIC PANEL - Abnormal; Notable for the following:    Albumin 3.2 (*)    GFR calc non Af Amer 89 (*)    All other components within normal limits  TROPONIN I  URINALYSIS, ROUTINE W REFLEX MICROSCOPIC    Imaging Review Dg Chest Port 1 View  04/11/2014   CLINICAL DATA:  Nausea and vomiting.  Hypertension.  EXAM: PORTABLE CHEST - 1 VIEW  COMPARISON:  07/07/2013  FINDINGS: Cardiac silhouette is normal in size. Normal mediastinal and hilar contours.  Lungs are clear.  No pleural effusion.  No pneumothorax.  The bony thorax is demineralized but intact.  IMPRESSION: No active disease.   Electronically Signed   By: Lajean Manes M.D.   On: 04/11/2014 20:25     EKG Interpretation   Date/Time:  Saturday Apr 11 2014 19:49:37 EDT Ventricular Rate:  123 PR Interval:    QRS Duration: 91 QT Interval:  334 QTC Calculation: 478 R Axis:   64 Text Interpretation:  atrial flutter with variable rate Borderline low  voltage, extremity leads new since 08 Sep 2005, EKG earlier today at 1744  showed atrial flutter as well Confirmed by KNAPP  MD-J, JON ([id number]) on  04/11/2014 7:58:32 PM      MDM   Final diagnoses:  None    This is a 58 y.o. female with PMH of obstructive sleep apnea, congestive heart failure, diabetes, morbid obesity, hypothyroidism, history of noncompliance with CPAP, overuse of narcotic pain medication, presenting with nausea. Onset 2 days ago, at home. Waxes and wanes. Nonbloody, nonbilious. No medications taken. Negative for pain, dizziness, weakness, numbness, tingling, dysuria, hematuria, diarrhea. Her symptoms resolved this morning. She  called her PCP to report symptoms, resolution. She spoke with PCP, mentioned abrasion to her left calf. They recommended evaluation due to nausea, abrasion. In transit, patient was noticed to be in atrial fibrillation, RVR. She remained stable in transit.  The patient denies any history of arrhythmia.  As above, patient's current rhythm is atrial flutter. Heart rate is 112, variable. Patient's blood pressure is stable, pulses are 2+ in all 4 extremities. Cardiovascular and respiratory examinations otherwise are normal.  There are no lab abnormalities to explain arrhythmia. Troponin is within normal limits. Chest x-ray is without acute abnormality. At this time, considering thyroid disease, thyroid medication, I have added TSH, free T4. I have ordered 20 mg of diltiazem to be bolused, followed by diltiazem drip. We'll continue to monitor closely, admit for new onset atrial flutter.  Patient is being admitted in stable condition.  I have discussed case and care has been guided by my attending physician, Dr. Tomi Bamberger.  Doy Hutching, MD 04/12/14 0020

## 2014-04-11 NOTE — ED Notes (Signed)
Vomited x 2 days, last was this morning.  Drank coke and kept it down since 1500.   Denies diarrhea.  Now just feels bad.

## 2014-04-11 NOTE — Progress Notes (Signed)
ANTICOAGULATION CONSULT NOTE - Initial Consult  Pharmacy Consult for Heparin  Indication: atrial fibrillation/flutter, new onset  Allergies  Allergen Reactions  . Aspirin Other (See Comments)    Chest pain  . Nsaids Other (See Comments)    Chest pain  . Other Other (See Comments)    "BP med" combined with plavix, caused syncope  . Plavix [Clopidogrel] Other (See Comments)    syncope  . Sudafed [Pseudoephedrine] Palpitations    Raised BP  . Erythromycin Nausea And Vomiting  . Sulfa Antibiotics Other (See Comments)    constipation  . Sulfamethoxazole-Trimethoprim Diarrhea    Patient Measurements: Height: 5\' 5"  (165.1 cm) Weight: 279 lb (126.554 kg) IBW/kg (Calculated) : 57 Heparin Dosing Weight: ~88 kg  Vital Signs: Temp: 98.1 F (36.7 C) (05/02 1743) Temp src: Oral (05/02 1743) BP: 110/87 mmHg (05/02 2100) Pulse Rate: 109 (05/02 2100)  Labs:  Recent Labs  04/11/14 1814  HGB 12.7  HCT 42.4  PLT 211  CREATININE 0.78  TROPONINI <0.30    Estimated Creatinine Clearance: 100.1 ml/min (by C-G formula based on Cr of 0.78).   Medical History: Past Medical History  Diagnosis Date  . OSA (obstructive sleep apnea)   . CHF (congestive heart failure)   . Diabetes mellitus   . Altered mental status     2nd to hypercapnea  . RLS (restless legs syndrome)   . Acute and chronic respiratory failure with hypercapnia   . Hypoxemia   . Endometrial cancer   . Morbid obesity   . Depression   . Hypothyroidism   . GERD (gastroesophageal reflux disease)   . Nephrolithiasis   . Chronic low back pain   . ADD (attention deficit disorder)   . Restless leg syndrome   . Coronary artery disease   . Complication of anesthesia     " I AM SLOW MTO WAKE UP AT ITMES "  . Hypertension   . Hypercapnia     Assessment: 58 y/o F to start heparin for new onset afib/flutter with RVR. CBC good, renal function good, other labs as above, no anticoagulants PTA.   Goal of Therapy:   Heparin level 0.3-0.7 units/ml Monitor platelets by anticoagulation protocol: Yes   Plan:  -Heparin 4500 units BOLUS x 1 -Start heparin drip at 1200 units/hr -0700 HL -Daily CBC/HL -Monitor for bleeding  Narda Bonds 04/11/2014,10:54 PM

## 2014-04-12 ENCOUNTER — Encounter (HOSPITAL_COMMUNITY): Payer: Self-pay | Admitting: *Deleted

## 2014-04-12 DIAGNOSIS — J9610 Chronic respiratory failure, unspecified whether with hypoxia or hypercapnia: Secondary | ICD-10-CM | POA: Diagnosis not present

## 2014-04-12 DIAGNOSIS — I10 Essential (primary) hypertension: Secondary | ICD-10-CM | POA: Diagnosis not present

## 2014-04-12 DIAGNOSIS — I509 Heart failure, unspecified: Secondary | ICD-10-CM | POA: Diagnosis not present

## 2014-04-12 DIAGNOSIS — I2510 Atherosclerotic heart disease of native coronary artery without angina pectoris: Secondary | ICD-10-CM | POA: Diagnosis not present

## 2014-04-12 DIAGNOSIS — I252 Old myocardial infarction: Secondary | ICD-10-CM | POA: Diagnosis not present

## 2014-04-12 DIAGNOSIS — J449 Chronic obstructive pulmonary disease, unspecified: Secondary | ICD-10-CM | POA: Diagnosis not present

## 2014-04-12 DIAGNOSIS — E662 Morbid (severe) obesity with alveolar hypoventilation: Secondary | ICD-10-CM | POA: Diagnosis not present

## 2014-04-12 DIAGNOSIS — G4733 Obstructive sleep apnea (adult) (pediatric): Secondary | ICD-10-CM | POA: Diagnosis not present

## 2014-04-12 DIAGNOSIS — I4891 Unspecified atrial fibrillation: Secondary | ICD-10-CM | POA: Diagnosis not present

## 2014-04-12 DIAGNOSIS — E119 Type 2 diabetes mellitus without complications: Secondary | ICD-10-CM | POA: Diagnosis not present

## 2014-04-12 LAB — CBC WITH DIFFERENTIAL/PLATELET
Basophils Absolute: 0 10*3/uL (ref 0.0–0.1)
Basophils Relative: 0 % (ref 0–1)
EOS ABS: 0 10*3/uL (ref 0.0–0.7)
EOS PCT: 0 % (ref 0–5)
HCT: 41.5 % (ref 36.0–46.0)
HEMOGLOBIN: 12.4 g/dL (ref 12.0–15.0)
Lymphocytes Relative: 17 % (ref 12–46)
Lymphs Abs: 1.6 10*3/uL (ref 0.7–4.0)
MCH: 25.2 pg — AB (ref 26.0–34.0)
MCHC: 29.9 g/dL — ABNORMAL LOW (ref 30.0–36.0)
MCV: 84.3 fL (ref 78.0–100.0)
MONOS PCT: 7 % (ref 3–12)
Monocytes Absolute: 0.7 10*3/uL (ref 0.1–1.0)
Neutro Abs: 7.4 10*3/uL (ref 1.7–7.7)
Neutrophils Relative %: 76 % (ref 43–77)
Platelets: 222 10*3/uL (ref 150–400)
RBC: 4.92 MIL/uL (ref 3.87–5.11)
RDW: 15.9 % — ABNORMAL HIGH (ref 11.5–15.5)
WBC: 9.7 10*3/uL (ref 4.0–10.5)

## 2014-04-12 LAB — BASIC METABOLIC PANEL
BUN: 15 mg/dL (ref 6–23)
CALCIUM: 8.8 mg/dL (ref 8.4–10.5)
CO2: 30 meq/L (ref 19–32)
Chloride: 100 mEq/L (ref 96–112)
Creatinine, Ser: 0.83 mg/dL (ref 0.50–1.10)
GFR calc Af Amer: 87 mL/min — ABNORMAL LOW (ref >90)
GFR calc non Af Amer: 75 mL/min — ABNORMAL LOW (ref >90)
Glucose, Bld: 87 mg/dL (ref 70–99)
Potassium: 4 mEq/L (ref 3.7–5.3)
Sodium: 143 mEq/L (ref 137–147)

## 2014-04-12 LAB — CBC
HCT: 41.1 % (ref 36.0–46.0)
HEMOGLOBIN: 12 g/dL (ref 12.0–15.0)
MCH: 24.7 pg — ABNORMAL LOW (ref 26.0–34.0)
MCHC: 29.2 g/dL — AB (ref 30.0–36.0)
MCV: 84.6 fL (ref 78.0–100.0)
PLATELETS: 177 10*3/uL (ref 150–400)
RBC: 4.86 MIL/uL (ref 3.87–5.11)
RDW: 15.8 % — ABNORMAL HIGH (ref 11.5–15.5)
WBC: 6.9 10*3/uL (ref 4.0–10.5)

## 2014-04-12 LAB — PROTIME-INR
INR: 1.09 (ref 0.00–1.49)
Prothrombin Time: 13.9 seconds (ref 11.6–15.2)

## 2014-04-12 LAB — COMPREHENSIVE METABOLIC PANEL
ALT: 13 U/L (ref 0–35)
AST: 18 U/L (ref 0–37)
Albumin: 3.2 g/dL — ABNORMAL LOW (ref 3.5–5.2)
Alkaline Phosphatase: 101 U/L (ref 39–117)
BUN: 15 mg/dL (ref 6–23)
CALCIUM: 9 mg/dL (ref 8.4–10.5)
CO2: 33 meq/L — AB (ref 19–32)
CREATININE: 0.85 mg/dL (ref 0.50–1.10)
Chloride: 100 mEq/L (ref 96–112)
GFR, EST AFRICAN AMERICAN: 85 mL/min — AB (ref >90)
GFR, EST NON AFRICAN AMERICAN: 73 mL/min — AB (ref >90)
GLUCOSE: 95 mg/dL (ref 70–99)
Potassium: 4 mEq/L (ref 3.7–5.3)
SODIUM: 144 meq/L (ref 137–147)
Total Bilirubin: 0.4 mg/dL (ref 0.3–1.2)
Total Protein: 7 g/dL (ref 6.0–8.3)

## 2014-04-12 LAB — GLUCOSE, CAPILLARY
GLUCOSE-CAPILLARY: 77 mg/dL (ref 70–99)
GLUCOSE-CAPILLARY: 72 mg/dL (ref 70–99)
Glucose-Capillary: 112 mg/dL — ABNORMAL HIGH (ref 70–99)
Glucose-Capillary: 125 mg/dL — ABNORMAL HIGH (ref 70–99)

## 2014-04-12 LAB — TROPONIN I: Troponin I: <0.3 ng/mL (ref <0.30)

## 2014-04-12 LAB — TSH: TSH: 2.63 u[IU]/mL (ref 0.350–4.500)

## 2014-04-12 LAB — MRSA PCR SCREENING: MRSA BY PCR: POSITIVE — AB

## 2014-04-12 LAB — LIPID PANEL
CHOL/HDL RATIO: 8.1 ratio
Cholesterol: 258 mg/dL — ABNORMAL HIGH (ref 0–200)
HDL: 32 mg/dL — ABNORMAL LOW (ref >39)
LDL Cholesterol: 191 mg/dL — ABNORMAL HIGH (ref 0–99)
Triglycerides: 175 mg/dL — ABNORMAL HIGH (ref <150)
VLDL: 35 mg/dL (ref 0–40)

## 2014-04-12 LAB — HEPARIN LEVEL (UNFRACTIONATED): HEPARIN UNFRACTIONATED: 0.2 [IU]/mL — AB (ref 0.30–0.70)

## 2014-04-12 LAB — APTT: aPTT: 69 seconds — ABNORMAL HIGH (ref 24–37)

## 2014-04-12 LAB — MAGNESIUM: Magnesium: 1.8 mg/dL (ref 1.5–2.5)

## 2014-04-12 MED ORDER — SERTRALINE HCL 100 MG PO TABS
200.0000 mg | ORAL_TABLET | Freq: Every day | ORAL | Status: DC
  Administered 2014-04-12 (×2): 200 mg via ORAL
  Filled 2014-04-12 (×3): qty 2

## 2014-04-12 MED ORDER — METHADONE HCL 10 MG PO TABS
20.0000 mg | ORAL_TABLET | Freq: Every morning | ORAL | Status: DC
  Administered 2014-04-12 – 2014-04-13 (×2): 20 mg via ORAL
  Filled 2014-04-12 (×2): qty 2

## 2014-04-12 MED ORDER — ASPIRIN 300 MG RE SUPP: 300.0000 mg | RECTAL | Status: AC

## 2014-04-12 MED ORDER — ASPIRIN EC 81 MG PO TBEC
81.0000 mg | DELAYED_RELEASE_TABLET | Freq: Every day | ORAL | Status: DC
  Administered 2014-04-12 – 2014-04-13 (×2): 81 mg via ORAL
  Filled 2014-04-12 (×2): qty 1

## 2014-04-12 MED ORDER — NITROGLYCERIN 0.4 MG SL SUBL: 0.4000 mg | SUBLINGUAL_TABLET | SUBLINGUAL | Status: DC | PRN

## 2014-04-12 MED ORDER — LOSARTAN POTASSIUM 50 MG PO TABS
50.0000 mg | ORAL_TABLET | Freq: Every day | ORAL | Status: DC
  Administered 2014-04-12 – 2014-04-13 (×2): 50 mg via ORAL
  Filled 2014-04-12 (×2): qty 1

## 2014-04-12 MED ORDER — LEVOTHYROXINE SODIUM 75 MCG PO TABS
75.0000 ug | ORAL_TABLET | Freq: Every day | ORAL | Status: DC
  Administered 2014-04-12 – 2014-04-13 (×2): 75 ug via ORAL
  Filled 2014-04-12 (×3): qty 1

## 2014-04-12 MED ORDER — DILTIAZEM HCL 100 MG IV SOLR: 5.0000 mg/h | INTRAVENOUS | Status: DC

## 2014-04-12 MED ORDER — APIXABAN 5 MG PO TABS
5.0000 mg | ORAL_TABLET | Freq: Two times a day (BID) | ORAL | Status: DC
  Administered 2014-04-12 – 2014-04-13 (×4): 5 mg via ORAL
  Filled 2014-04-12 (×5): qty 1

## 2014-04-12 MED ORDER — PANTOPRAZOLE SODIUM 20 MG PO TBEC
20.0000 mg | DELAYED_RELEASE_TABLET | Freq: Every day | ORAL | Status: DC
  Administered 2014-04-12 – 2014-04-13 (×2): 20 mg via ORAL
  Filled 2014-04-12 (×2): qty 1

## 2014-04-12 MED ORDER — DILTIAZEM LOAD VIA INFUSION: 10.0000 mg | Freq: Once | INTRAVENOUS | Status: DC

## 2014-04-12 MED ORDER — PROMETHAZINE HCL 25 MG PO TABS
12.5000 mg | ORAL_TABLET | Freq: Three times a day (TID) | ORAL | Status: DC | PRN
  Filled 2014-04-12: qty 1

## 2014-04-12 MED ORDER — PNEUMOCOCCAL VAC POLYVALENT 25 MCG/0.5ML IJ INJ
0.5000 mL | INJECTION | INTRAMUSCULAR | Status: DC
  Filled 2014-04-12: qty 0.5

## 2014-04-12 MED ORDER — PROPRANOLOL HCL 40 MG PO TABS
40.0000 mg | ORAL_TABLET | Freq: Two times a day (BID) | ORAL | Status: DC
  Administered 2014-04-12 – 2014-04-13 (×5): 40 mg via ORAL
  Filled 2014-04-12 (×5): qty 1

## 2014-04-12 MED ORDER — FUROSEMIDE 40 MG PO TABS
40.0000 mg | ORAL_TABLET | Freq: Every day | ORAL | Status: DC
  Administered 2014-04-12 – 2014-04-13 (×2): 40 mg via ORAL
  Filled 2014-04-12 (×2): qty 1

## 2014-04-12 MED ORDER — LORATADINE 10 MG PO TABS
10.0000 mg | ORAL_TABLET | Freq: Every day | ORAL | Status: DC
  Administered 2014-04-12 – 2014-04-13 (×2): 10 mg via ORAL
  Filled 2014-04-12 (×2): qty 1

## 2014-04-12 MED ORDER — ACETAMINOPHEN 325 MG PO TABS
650.0000 mg | ORAL_TABLET | Freq: Four times a day (QID) | ORAL | Status: DC | PRN
  Administered 2014-04-12: 650 mg via ORAL
  Filled 2014-04-12: qty 2

## 2014-04-12 MED ORDER — ASPIRIN 81 MG PO CHEW: 324.0000 mg | CHEWABLE_TABLET | ORAL | Status: AC

## 2014-04-12 MED ORDER — DILTIAZEM HCL ER COATED BEADS 180 MG PO CP24
180.0000 mg | ORAL_CAPSULE | Freq: Every day | ORAL | Status: DC
  Administered 2014-04-12: 180 mg via ORAL
  Filled 2014-04-12 (×2): qty 1

## 2014-04-12 MED ORDER — INSULIN ASPART 100 UNIT/ML ~~LOC~~ SOLN: 0.0000 [IU] | Freq: Three times a day (TID) | SUBCUTANEOUS | Status: DC

## 2014-04-12 MED ORDER — POTASSIUM CHLORIDE CRYS ER 20 MEQ PO TBCR
20.0000 meq | EXTENDED_RELEASE_TABLET | Freq: Every day | ORAL | Status: DC
  Administered 2014-04-12 – 2014-04-13 (×2): 20 meq via ORAL
  Filled 2014-04-12 (×2): qty 1

## 2014-04-12 MED ORDER — SODIUM CHLORIDE 0.9 % IV SOLN: INTRAVENOUS | Status: DC

## 2014-04-12 NOTE — Progress Notes (Signed)
Subjective:  Patient denies any chest pain or shortness of breath. Denies any palpitations. Denies any further nausea or vomiting. States feels weak and tired otherwise feels okay. Discussed with patient regardingTEE cardioversion states want to wait for a few weeks but agrees for rate control and chronic anticoagulation and if she still feels weak consider either chemical or TEE  assisted cardioversion. Remains on IV Cardizem which we will switch to by mouth  Objective:  Vital Signs in the last 24 hours: Temp:  [97.8 F (36.6 C)-98.1 F (36.7 C)] 97.8 F (36.6 C) (05/03 0800) Pulse Rate:  [73-120] 73 (05/03 0800) Resp:  [13-26] 21 (05/03 0115) BP: (108-147)/(44-101) 125/65 mmHg (05/03 0800) SpO2:  [93 %-100 %] 99 % (05/03 0800) Weight:  [119.3 kg (263 lb 0.1 oz)-126.554 kg (279 lb)] 119.3 kg (263 lb 0.1 oz) (05/03 0115)  Intake/Output from previous day: 05/02 0701 - 05/03 0700 In: 17 [I.V.:17] Out: -  Intake/Output from this shift: Total I/O In: 17 [I.V.:17] Out: -   Physical Exam: Neck: no adenopathy, no carotid bruit, no JVD and supple, symmetrical, trachea midline Lungs: clear to auscultation bilaterally Heart: irregularly irregular rhythm, S1, S2 normal and soft murmur noted no S3 gallop Abdomen: soft, non-tender; bowel sounds normal; no masses,  no organomegaly Extremities: extremities normal, atraumatic, no cyanosis or edema  Lab Results:  Recent Labs  04/12/14 0135 04/12/14 0740  WBC 9.7 6.9  HGB 12.4 12.0  PLT 222 177    Recent Labs  04/11/14 1814 04/12/14 0135  NA 142 144  K 4.6 4.0  CL 100 100  CO2 30 33*  GLUCOSE 89 95  BUN 15 15  CREATININE 0.78 0.85    Recent Labs  04/12/14 0135 04/12/14 0740  TROPONINI <0.30 <0.30   Hepatic Function Panel  Recent Labs  04/12/14 0135  PROT 7.0  ALBUMIN 3.2*  AST 18  ALT 13  ALKPHOS 101  BILITOT 0.4   No results found for this basename: CHOL,  in the last 72 hours No results found for this  basename: PROTIME,  in the last 72 hours  Imaging: Imaging results have been reviewed and Dg Chest Port 1 View  04/11/2014   CLINICAL DATA:  Nausea and vomiting.  Hypertension.  EXAM: PORTABLE CHEST - 1 VIEW  COMPARISON:  07/07/2013  FINDINGS: Cardiac silhouette is normal in size. Normal mediastinal and hilar contours.  Lungs are clear.  No pleural effusion.  No pneumothorax.  The bony thorax is demineralized but intact.  IMPRESSION: No active disease.   Electronically Signed   By: Lajean Manes M.D.   On: 04/11/2014 20:25    Cardiac Studies:  Assessment/Plan:  New onset A. fib with RVR  Hypertension  CAD history of silent inferior wall MI in the past  History of congestive heart failure secondary to preserved LV systolic function  Diabetes mellitus  Obstructive sleep apnea/obesity hypoventilation syndrome on CPAP  Chronic hypoxic/hypercarbic respiratory failure  COPD  Remote tobacco abuse  Degenerative joint disease  Hypothyroidism  Morbid obesity  Restless leg syndrome Plan DC heparin Start Eliquis 5 mg twice daily Check 2-D echo Dr. Doylene Canard to read Change IV Cardizem to by mouth Patient encouraged to use CPAP at night daily. Dr. Doylene Canard to follow from a.m.  LOS: 1 day    Clent Demark 04/12/2014, 9:06 AM

## 2014-04-12 NOTE — ED Notes (Signed)
Report to Tish RN on 2H.  Pt to go to floor on monitor with RN accompanying.

## 2014-04-12 NOTE — Progress Notes (Signed)
Pt. Refused cpap for tonight. RT informed pt. To notify if she changed her mind.

## 2014-04-12 NOTE — Consult Note (Signed)
Pharmacy Note-Anticoagulation  Pharmacy Consult :  58 y.o. female is to be transitioned from Heparin infusion to Apixaban for new onset of Non-valvular atrial fibrillation.   Current Wt :  119.3 kg  Hematology :  Recent Labs  04/11/14 1814 04/12/14 0135 04/12/14 0740  HGB 12.7 12.4 12.0  HCT 42.4 41.5 41.1  PLT 211 222 177  APTT  --  69*  --   LABPROT  --  13.9  --   INR  --  1.09  --   HEPARINUNFRC  --   --  0.20*  CREATININE 0.78 0.85 0.83   Estimated Creatinine Clearance: 93.2 ml/min (by C-G formula based on Cr of 0.83).   Current Medication[s] Include: Medication PTA: Prescriptions prior to admission  Medication Sig Dispense Refill  . b complex vitamins tablet Take 1 tablet by mouth daily.        Marland Kitchen BIOTIN PO Take 1 tablet by mouth 2 (two) times daily.      . cetirizine (ZYRTEC) 10 MG tablet Take 10 mg by mouth at bedtime.      . lansoprazole (PREVACID) 30 MG capsule Take 30 mg by mouth at bedtime.      Marland Kitchen levothyroxine (SYNTHROID, LEVOTHROID) 75 MCG tablet Take 75 mcg by mouth daily.        Marland Kitchen losartan (COZAAR) 50 MG tablet Take 50 mg by mouth daily.       . methadone (DOLOPHINE) 10 MG/5ML solution Take 70 mg by mouth every morning.       . methylphenidate (RITALIN) 20 MG tablet Take 20 mg by mouth daily.      Marland Kitchen neomycin-bacitracin-polymyxin (NEOSPORIN) OINT Apply 1 application topically as needed for wound care.      Marland Kitchen oxymetazoline (AFRIN) 0.05 % nasal spray Place 1 spray into both nostrils 2 (two) times daily as needed for congestion.      . propranolol (INDERAL) 40 MG tablet Take 40 mg by mouth 2 (two) times daily.      . sertraline (ZOLOFT) 100 MG tablet Take 200 mg by mouth at bedtime.       Marland Kitchen tolnaftate (TINACTIN) 1 % external solution Apply 1 application topically 2 (two) times daily as needed (for rash). Apply to toe nails      . furosemide (LASIX) 40 MG tablet Take 120 mg by mouth daily.      . potassium chloride SA (K-DUR,KLOR-CON) 20 MEQ tablet Take 20 mEq by  mouth 2 (two) times daily.       Scheduled:  Scheduled:  . apixaban  5 mg Oral BID  . aspirin  324 mg Oral NOW   Or  . aspirin  300 mg Rectal NOW  . aspirin EC  81 mg Oral Daily  . diltiazem  180 mg Oral Daily  . diltiazem  10 mg Intravenous Once  . furosemide  40 mg Oral Daily  . insulin aspart  0-9 Units Subcutaneous TID WC  . levothyroxine  75 mcg Oral QAC breakfast  . loratadine  10 mg Oral Daily  . losartan  50 mg Oral Daily  . methadone  20 mg Oral q morning - 10a  . pantoprazole  20 mg Oral Daily  . [START ON 04/13/2014] pneumococcal 23 valent vaccine  0.5 mL Intramuscular Tomorrow-1000  . potassium chloride SA  20 mEq Oral Daily  . propranolol  40 mg Oral BID  . sertraline  200 mg Oral QHS   Infusion[s]: Infusions:  . sodium chloride  Antibiotic[s]: Anti-infectives   None     Assessment :  58 y/o female with new onset of Non-valvular atrial fibrillation is to be transitioned from Heparin infusion to Apixaban per Pharmacy Consult.  Heparin infusion has been discontinued.  Renal function good, estimated CrCl > 90 ml/min.  Goal :  Apixaban for Non-valvular atrial fibrillation, no dosing adjustments required.  Plan : 1. Heparin has been discontinued  2. Begin Apixaban 5 mg po BID without regards to meals. 3. CBC in 2-3 days.  [name], Craig Guess,  Pharm.D  04/12/2014  9:32 AM

## 2014-04-13 DIAGNOSIS — J9610 Chronic respiratory failure, unspecified whether with hypoxia or hypercapnia: Secondary | ICD-10-CM | POA: Diagnosis not present

## 2014-04-13 DIAGNOSIS — I4891 Unspecified atrial fibrillation: Secondary | ICD-10-CM | POA: Diagnosis not present

## 2014-04-13 DIAGNOSIS — E662 Morbid (severe) obesity with alveolar hypoventilation: Secondary | ICD-10-CM | POA: Diagnosis not present

## 2014-04-13 DIAGNOSIS — I509 Heart failure, unspecified: Secondary | ICD-10-CM | POA: Diagnosis not present

## 2014-04-13 LAB — URINALYSIS, ROUTINE W REFLEX MICROSCOPIC
Glucose, UA: NEGATIVE mg/dL
Ketones, ur: 15 mg/dL — AB
Nitrite: NEGATIVE
Protein, ur: NEGATIVE mg/dL
Specific Gravity, Urine: 1.022 (ref 1.005–1.030)
Urobilinogen, UA: 1 mg/dL (ref 0.0–1.0)
pH: 5.5 (ref 5.0–8.0)

## 2014-04-13 LAB — GLUCOSE, CAPILLARY
Glucose-Capillary: 98 mg/dL (ref 70–99)
Glucose-Capillary: 118 mg/dL — ABNORMAL HIGH (ref 70–99)
Glucose-Capillary: 120 mg/dL — ABNORMAL HIGH (ref 70–99)

## 2014-04-13 LAB — HEMOGLOBIN A1C
Hgb A1c MFr Bld: 6.3 % — ABNORMAL HIGH (ref <5.7)
MEAN PLASMA GLUCOSE: 134 mg/dL — AB (ref <117)

## 2014-04-13 LAB — CBC
HCT: 39.2 % (ref 36.0–46.0)
HEMOGLOBIN: 11.8 g/dL — AB (ref 12.0–15.0)
MCH: 25.1 pg — AB (ref 26.0–34.0)
MCHC: 30.1 g/dL (ref 30.0–36.0)
MCV: 83.2 fL (ref 78.0–100.0)
Platelets: 189 10*3/uL (ref 150–400)
RBC: 4.71 MIL/uL (ref 3.87–5.11)
RDW: 16 % — ABNORMAL HIGH (ref 11.5–15.5)
WBC: 7.6 10*3/uL (ref 4.0–10.5)

## 2014-04-13 LAB — URINE MICROSCOPIC-ADD ON

## 2014-04-13 LAB — T4, FREE: FREE T4: 1.29 ng/dL (ref 0.80–1.80)

## 2014-04-13 MED ORDER — MUPIROCIN 2 % EX OINT
TOPICAL_OINTMENT | CUTANEOUS | Status: AC
  Filled 2014-04-13: qty 22

## 2014-04-13 MED ORDER — FUROSEMIDE 10 MG/ML IJ SOLN
40.0000 mg | Freq: Two times a day (BID) | INTRAMUSCULAR | Status: DC
  Administered 2014-04-13: 40 mg via INTRAVENOUS
  Filled 2014-04-13: qty 4

## 2014-04-13 MED ORDER — CHLORHEXIDINE GLUCONATE CLOTH 2 % EX PADS
6.0000 | MEDICATED_PAD | Freq: Every day | CUTANEOUS | Status: DC
  Administered 2014-04-13: 6 via TOPICAL

## 2014-04-13 MED ORDER — APIXABAN 5 MG PO TABS: 5.0000 mg | ORAL_TABLET | Freq: Two times a day (BID) | ORAL | Status: DC

## 2014-04-13 MED ORDER — FUROSEMIDE 40 MG PO TABS: 40.0000 mg | ORAL_TABLET | Freq: Two times a day (BID) | ORAL | Status: DC

## 2014-04-13 MED ORDER — DILTIAZEM HCL ER COATED BEADS 240 MG PO CP24: 240.0000 mg | ORAL_CAPSULE | Freq: Every day | ORAL | Status: DC

## 2014-04-13 MED ORDER — MUPIROCIN 2 % EX OINT
1.0000 "application " | TOPICAL_OINTMENT | Freq: Two times a day (BID) | CUTANEOUS | Status: DC
  Administered 2014-04-13 (×2): 1 via NASAL

## 2014-04-13 MED ORDER — MUPIROCIN 2 % EX OINT: 1.0000 "application " | TOPICAL_OINTMENT | Freq: Two times a day (BID) | CUTANEOUS | Status: DC

## 2014-04-13 MED ORDER — DILTIAZEM HCL ER COATED BEADS 240 MG PO CP24
240.0000 mg | ORAL_CAPSULE | Freq: Every day | ORAL | Status: DC
  Administered 2014-04-13: 240 mg via ORAL
  Filled 2014-04-13: qty 1

## 2014-04-13 NOTE — Care Management Note (Signed)
    Page 1 of 1   04/13/2014     1:48:06 PM CARE MANAGEMENT NOTE 04/13/2014  Patient:  [name], [name]   Account Number:  [id number]  Date Initiated:  04/13/2014  Documentation initiated by:  Elissa Hefty  Subjective/Objective Assessment:   adm w at fib     Action/Plan:   lives w fam, pcp dr Mat Carne   Anticipated DC Date:     Anticipated DC Plan:  [place]  CM consult  Medication Assistance      Choice offered to / List presented to:             Status of service:  Completed, signed off Medicare Important Message given?  YES (If response is "NO", the following Medicare IM given date fields will be blank) Date Medicare IM given:  04/13/2014 Date Additional Medicare IM given:    Discharge Disposition:  HOME/SELF CARE  Per UR Regulation:  Reviewed for med. necessity/level of care/duration of stay  If discussed at [place] of Stay Meetings, dates discussed:    Comments:  5/4 1346 debbie Hali Balgobin rn,bsn gave pt copy of importan message and pt signed copy for shadow chart. pt states she has ins for meds. gave her 30day free eliquis card and she will use medicare d after that.

## 2014-04-13 NOTE — Discharge Instructions (Signed)
Information on my medicine - ELIQUIS® (apixaban) ° °This medication education was reviewed with me or my healthcare representative as part of my discharge preparation.  The pharmacist that spoke with me during my hospital stay was:  Islah Eve Rhea Kamyra Schroeck, RPH ° °Why was Eliquis® prescribed for you? °Eliquis® was prescribed for you to reduce the risk of a blood clot forming that can cause a stroke if you have a medical condition called atrial fibrillation (a type of irregular heartbeat). ° °What do You need to know about Eliquis® ? °Take your Eliquis® TWICE DAILY - one tablet in the morning and one tablet in the evening with or without food. If you have difficulty swallowing the tablet whole please discuss with your pharmacist how to take the medication safely. ° °Take Eliquis® exactly as prescribed by your doctor and DO NOT stop taking Eliquis® without talking to the doctor who prescribed the medication.  Stopping may increase your risk of developing a stroke.  Refill your prescription before you run out. ° °After discharge, you should have regular check-up appointments with your healthcare provider that is prescribing your Eliquis®.  In the future your dose may need to be changed if your kidney function or weight changes by a significant amount or as you get older. ° °What do you do if you miss a dose? °If you miss a dose, take it as soon as you remember on the same day and resume taking twice daily.  Do not take more than one dose of ELIQUIS at the same time to make up a missed dose. ° °Important Safety Information °A possible side effect of Eliquis® is bleeding. You should call your healthcare provider right away if you experience any of the following: °  Bleeding from an injury or your nose that does not stop. °  Unusual colored urine (red or dark brown) or unusual colored stools (red or black). °  Unusual bruising for unknown reasons. °  A serious fall or if you hit your head (even if there is no bleeding). ° °Some  medicines may interact with Eliquis® and might increase your risk of bleeding or clotting while on Eliquis®. To help avoid this, consult your healthcare provider or pharmacist prior to using any new prescription or non-prescription medications, including herbals, vitamins, non-steroidal anti-inflammatory drugs (NSAIDs) and supplements. ° °This website has more information on Eliquis® (apixaban): www.Eliquis.com. ° °

## 2014-04-13 NOTE — Discharge Summary (Signed)
Physician Discharge Summary  Patient ID: [name] MRN: [id number] DOB/AGE: 03-16-1956 58 y.o.  Admit date: 04/11/2014 Discharge date: 04/13/2014  Admission Diagnoses: New onset A. fib with RVR  Hypertension  CAD history of silent inferior wall MI in the past  History of congestive heart failure secondary to preserved LV systolic function  Diabetes mellitus  Obstructive sleep apnea/obesity hypoventilation syndrome on CPAP  Chronic hypoxic/hypercarbic respiratory failure  COPD  Remote tobacco abuse  Degenerative joint disease  Hypothyroidism  Morbid obesity  Restless leg syndrome  Discharge Diagnoses:  Principle Problem: * New onset atrial fibrillation * Hypertension  CAD history of silent inferior wall MI in the past  History of congestive heart failure secondary to preserved LV systolic function  Diabetes mellitus, II  Obstructive sleep apnea/obesity hypoventilation syndrome on CPAP  Chronic hypoxic/hypercarbic respiratory failure  COPD  Remote tobacco abuse  Degenerative joint disease  Hypothyroidism  Morbid obesity  Restless leg syndrome  Discharged Condition: fair  Hospital Course: 58 year old female with past medical history significant for coronary artery disease, history of silent inferior wall myocardial infarction, occluded RCA in the past, hypertension, diabetes mellitus, II, history of congestive heart failure secondary to preserved LV systolic function, obstructive sleep apnea/obesity hypoventilation syndrome on CPAP, hypothyroidism, hypercholesteremia, restless leg syndrome, remote tobacco abuse, chronic hypoxic/hyper carbic respiratory failure, degenerative joint disease, morbid obesity, came to the ER complaining of nausea with vomiting associated with feeling weak tired fatigued for last 2-3 days. Patient denied any fever or chills. Denied any palpitation, lightheadedness or syncope. Denied any chest pain. EKG done in the ER showed new onset A. fib with RVR.  IV followed by PO diltiazem was used for rate control and Eliquis for anticoagulation. Patinet may undergo direct current cardioversion in near future.  Consults: cardiology  Significant Diagnostic Studies: labs: Near normal CBC, CMET and Troponin-I.  EKG- Atrial fibrillation with RVR and non-specific ST abnormality.  Chest X-ray: Unremarkable.  Treatments: cardiac meds: propranolol, diltiazem and Lasix and anticoagulation: Eliquis ( Apixaban)  Discharge Exam: Blood pressure 115/74, pulse 70, temperature 98 F (36.7 C), temperature source Oral, resp. rate 20, height 5\' 5"  (1.651 m), weight 119.3 kg (263 lb 0.1 oz), SpO2 95.00%.   Disposition: 01-Home or Self Care Physical Exam  Constitutional: Well built and over nourished. HENT: [place]/AT, Blue eyes, wears glasses, Conjunctivae are normal. Pupils are equal, round, and reactive to light. No scleral icterus. Edentulous. Neck: Neck supple. No JVD present. No tracheal deviation present. No thyromegaly present.  Cardiovascular: Irregularly irregular S1 and S2. there is soft systolic murmur no S3 gallop.  Respiratory: Effort normal and breath sounds normal. No respiratory distress. She has no wheezes. She has no rales.  GI: Soft. Bowel sounds are normal. She exhibits distension. There is no tenderness. There is no rebound.  Musculoskeletal: She exhibits 1 + edema and no tenderness.  Neurological: She is alert and oriented to person, place, and time. No cranial nerve deficit.      Medication List    STOP taking these medications       methylphenidate 20 MG tablet  Commonly known as:  RITALIN      TAKE these medications       apixaban 5 MG Tabs tablet  Commonly known as:  ELIQUIS  Take 1 tablet (5 mg total) by mouth 2 (two) times daily.     b complex vitamins tablet  Take 1 tablet by mouth daily.     BIOTIN PO  Take 1 tablet by  mouth 2 (two) times daily.     cetirizine 10 MG tablet  Commonly known as:  ZYRTEC  Take 10 mg by  mouth at bedtime.     diltiazem 240 MG 24 hr capsule  Commonly known as:  CARDIZEM CD  Take 1 capsule (240 mg total) by mouth daily.     furosemide 40 MG tablet  Commonly known as:  LASIX  Take 1 tablet (40 mg total) by mouth 2 (two) times daily.     lansoprazole 30 MG capsule  Commonly known as:  PREVACID  Take 30 mg by mouth at bedtime.     levothyroxine 75 MCG tablet  Commonly known as:  SYNTHROID, LEVOTHROID  Take 75 mcg by mouth daily.     losartan 50 MG tablet  Commonly known as:  COZAAR  Take 50 mg by mouth daily.     methadone 10 MG/5ML solution  Commonly known as:  DOLOPHINE  Take 70 mg by mouth every morning.     mupirocin ointment 2 %  Commonly known as:  BACTROBAN  Place 1 application into the nose 2 (two) times daily.     neomycin-bacitracin-polymyxin Oint  Commonly known as:  NEOSPORIN  Apply 1 application topically as needed for wound care.     oxymetazoline 0.05 % nasal spray  Commonly known as:  AFRIN  Place 1 spray into both nostrils 2 (two) times daily as needed for congestion.     potassium chloride SA 20 MEQ tablet  Commonly known as:  K-DUR,KLOR-CON  Take 20 mEq by mouth 2 (two) times daily.     propranolol 40 MG tablet  Commonly known as:  INDERAL  Take 40 mg by mouth 2 (two) times daily.     sertraline 100 MG tablet  Commonly known as:  ZOLOFT  Take 200 mg by mouth at bedtime.     tolnaftate 1 % external solution  Commonly known as:  TINACTIN  Apply 1 application topically 2 (two) times daily as needed (for rash). Apply to toe nails           Follow-up Information   Follow up with [facility], MD. Schedule an appointment as soon as possible for a visit in 1 week.   Specialty:  Cardiology   Contact information:   [place] [address] [phone]       Signed: Birdie Riddle 04/13/2014, 7:21 PM

## 2014-04-13 NOTE — Progress Notes (Signed)
Pt given AVS for discharge. Read with pt. Instructions given. Teach back method used. Pt verbalizes understanding. 2 peripheral IVs removed. Pt discharged home. VSS.  [name]

## 2014-04-13 NOTE — Progress Notes (Signed)
Echocardiogram 2D Echocardiogram has been performed.  [name] 04/13/2014, 11:50 AM

## 2014-04-22 DIAGNOSIS — I5022 Chronic systolic (congestive) heart failure: Secondary | ICD-10-CM | POA: Diagnosis not present

## 2014-04-22 DIAGNOSIS — E039 Hypothyroidism, unspecified: Secondary | ICD-10-CM | POA: Diagnosis not present

## 2014-04-22 DIAGNOSIS — E669 Obesity, unspecified: Secondary | ICD-10-CM | POA: Diagnosis not present

## 2014-04-22 DIAGNOSIS — I4891 Unspecified atrial fibrillation: Secondary | ICD-10-CM | POA: Diagnosis not present

## 2014-04-22 DIAGNOSIS — E119 Type 2 diabetes mellitus without complications: Secondary | ICD-10-CM | POA: Diagnosis not present

## 2014-04-23 DIAGNOSIS — E669 Obesity, unspecified: Secondary | ICD-10-CM | POA: Diagnosis not present

## 2014-04-23 DIAGNOSIS — E039 Hypothyroidism, unspecified: Secondary | ICD-10-CM | POA: Diagnosis not present

## 2014-04-23 DIAGNOSIS — I4891 Unspecified atrial fibrillation: Secondary | ICD-10-CM | POA: Diagnosis not present

## 2014-04-23 DIAGNOSIS — E119 Type 2 diabetes mellitus without complications: Secondary | ICD-10-CM | POA: Diagnosis not present

## 2014-05-21 DIAGNOSIS — F410 Panic disorder [episodic paroxysmal anxiety] without agoraphobia: Secondary | ICD-10-CM | POA: Diagnosis not present

## 2014-05-21 DIAGNOSIS — F320 Major depressive disorder, single episode, mild: Secondary | ICD-10-CM | POA: Diagnosis not present

## 2014-06-11 DIAGNOSIS — M1710 Unilateral primary osteoarthritis, unspecified knee: Secondary | ICD-10-CM | POA: Diagnosis not present

## 2014-06-11 DIAGNOSIS — IMO0002 Reserved for concepts with insufficient information to code with codable children: Secondary | ICD-10-CM | POA: Diagnosis not present

## 2014-07-27 DIAGNOSIS — IMO0002 Reserved for concepts with insufficient information to code with codable children: Secondary | ICD-10-CM | POA: Diagnosis not present

## 2014-07-27 DIAGNOSIS — M1710 Unilateral primary osteoarthritis, unspecified knee: Secondary | ICD-10-CM | POA: Diagnosis not present

## 2014-08-31 DIAGNOSIS — F410 Panic disorder [episodic paroxysmal anxiety] without agoraphobia: Secondary | ICD-10-CM | POA: Diagnosis not present

## 2014-09-17 DIAGNOSIS — I5022 Chronic systolic (congestive) heart failure: Secondary | ICD-10-CM | POA: Diagnosis not present

## 2014-09-17 DIAGNOSIS — E039 Hypothyroidism, unspecified: Secondary | ICD-10-CM | POA: Diagnosis not present

## 2014-09-17 DIAGNOSIS — E1140 Type 2 diabetes mellitus with diabetic neuropathy, unspecified: Secondary | ICD-10-CM | POA: Diagnosis not present

## 2014-09-21 DIAGNOSIS — M170 Bilateral primary osteoarthritis of knee: Secondary | ICD-10-CM | POA: Diagnosis not present

## 2014-10-04 ENCOUNTER — Emergency Department (HOSPITAL_COMMUNITY): Payer: Medicare Other

## 2014-10-04 ENCOUNTER — Encounter (HOSPITAL_COMMUNITY): Payer: Self-pay | Admitting: Emergency Medicine

## 2014-10-04 DIAGNOSIS — I10 Essential (primary) hypertension: Secondary | ICD-10-CM | POA: Insufficient documentation

## 2014-10-04 DIAGNOSIS — Z87891 Personal history of nicotine dependence: Secondary | ICD-10-CM | POA: Insufficient documentation

## 2014-10-04 DIAGNOSIS — J189 Pneumonia, unspecified organism: Secondary | ICD-10-CM | POA: Diagnosis not present

## 2014-10-04 DIAGNOSIS — Z1504 Genetic susceptibility to malignant neoplasm of endometrium: Secondary | ICD-10-CM | POA: Diagnosis not present

## 2014-10-04 DIAGNOSIS — G8929 Other chronic pain: Secondary | ICD-10-CM | POA: Diagnosis not present

## 2014-10-04 DIAGNOSIS — I2510 Atherosclerotic heart disease of native coronary artery without angina pectoris: Secondary | ICD-10-CM | POA: Diagnosis not present

## 2014-10-04 DIAGNOSIS — F329 Major depressive disorder, single episode, unspecified: Secondary | ICD-10-CM | POA: Insufficient documentation

## 2014-10-04 DIAGNOSIS — E119 Type 2 diabetes mellitus without complications: Secondary | ICD-10-CM | POA: Insufficient documentation

## 2014-10-04 DIAGNOSIS — Z87442 Personal history of urinary calculi: Secondary | ICD-10-CM | POA: Insufficient documentation

## 2014-10-04 DIAGNOSIS — I509 Heart failure, unspecified: Secondary | ICD-10-CM | POA: Insufficient documentation

## 2014-10-04 DIAGNOSIS — R918 Other nonspecific abnormal finding of lung field: Secondary | ICD-10-CM | POA: Diagnosis not present

## 2014-10-04 DIAGNOSIS — J159 Unspecified bacterial pneumonia: Secondary | ICD-10-CM | POA: Insufficient documentation

## 2014-10-04 DIAGNOSIS — R0602 Shortness of breath: Secondary | ICD-10-CM | POA: Diagnosis not present

## 2014-10-04 DIAGNOSIS — Z8669 Personal history of other diseases of the nervous system and sense organs: Secondary | ICD-10-CM | POA: Diagnosis not present

## 2014-10-04 DIAGNOSIS — Z79899 Other long term (current) drug therapy: Secondary | ICD-10-CM | POA: Diagnosis not present

## 2014-10-04 DIAGNOSIS — R05 Cough: Secondary | ICD-10-CM | POA: Diagnosis not present

## 2014-10-04 DIAGNOSIS — K219 Gastro-esophageal reflux disease without esophagitis: Secondary | ICD-10-CM | POA: Diagnosis not present

## 2014-10-04 DIAGNOSIS — E039 Hypothyroidism, unspecified: Secondary | ICD-10-CM | POA: Diagnosis not present

## 2014-10-04 LAB — CBC WITH DIFFERENTIAL/PLATELET
Basophils Absolute: 0 10*3/uL (ref 0.0–0.1)
Basophils Relative: 0 % (ref 0–1)
Eosinophils Absolute: 0 10*3/uL (ref 0.0–0.7)
Eosinophils Relative: 0 % (ref 0–5)
HEMATOCRIT: 42.9 % (ref 36.0–46.0)
Hemoglobin: 13 g/dL (ref 12.0–15.0)
LYMPHS ABS: 0.7 10*3/uL (ref 0.7–4.0)
LYMPHS PCT: 5 % — AB (ref 12–46)
MCH: 25.9 pg — AB (ref 26.0–34.0)
MCHC: 30.3 g/dL (ref 30.0–36.0)
MCV: 85.5 fL (ref 78.0–100.0)
Monocytes Absolute: 1 10*3/uL (ref 0.1–1.0)
Monocytes Relative: 7 % (ref 3–12)
Neutro Abs: 12.7 10*3/uL — ABNORMAL HIGH (ref 1.7–7.7)
Neutrophils Relative %: 88 % — ABNORMAL HIGH (ref 43–77)
Platelets: 201 10*3/uL (ref 150–400)
RBC: 5.02 MIL/uL (ref 3.87–5.11)
RDW: 14.8 % (ref 11.5–15.5)
WBC: 14.3 10*3/uL — AB (ref 4.0–10.5)

## 2014-10-04 LAB — COMPREHENSIVE METABOLIC PANEL
ALK PHOS: 108 U/L (ref 39–117)
ALT: 16 U/L (ref 0–35)
ANION GAP: 9 (ref 5–15)
AST: 16 U/L (ref 0–37)
Albumin: 3 g/dL — ABNORMAL LOW (ref 3.5–5.2)
BUN: 21 mg/dL (ref 6–23)
CALCIUM: 9.1 mg/dL (ref 8.4–10.5)
CO2: 36 meq/L — AB (ref 19–32)
Chloride: 94 mEq/L — ABNORMAL LOW (ref 96–112)
Creatinine, Ser: 0.96 mg/dL (ref 0.50–1.10)
GFR, EST AFRICAN AMERICAN: 73 mL/min — AB (ref >90)
GFR, EST NON AFRICAN AMERICAN: 63 mL/min — AB (ref >90)
GLUCOSE: 126 mg/dL — AB (ref 70–99)
Potassium: 4.4 mEq/L (ref 3.7–5.3)
SODIUM: 139 meq/L (ref 137–147)
Total Bilirubin: 0.3 mg/dL (ref 0.3–1.2)
Total Protein: 7.5 g/dL (ref 6.0–8.3)

## 2014-10-04 NOTE — ED Notes (Signed)
Pt. reports productive cough for 2 days with occasional nausea/vomitting . Denies fever or chills.

## 2014-10-04 NOTE — ED Notes (Signed)
Patient transported to X-ray 

## 2014-10-05 ENCOUNTER — Emergency Department (HOSPITAL_COMMUNITY)
Admission: EM | Admit: 2014-10-05 | Discharge: 2014-10-05 | Disposition: A | Discharge status: Home / Self Care | Payer: Medicare Other | Attending: Emergency Medicine | Admitting: Emergency Medicine

## 2014-10-05 DIAGNOSIS — R059 Cough, unspecified: Secondary | ICD-10-CM

## 2014-10-05 DIAGNOSIS — J159 Unspecified bacterial pneumonia: Secondary | ICD-10-CM | POA: Diagnosis not present

## 2014-10-05 DIAGNOSIS — J189 Pneumonia, unspecified organism: Secondary | ICD-10-CM

## 2014-10-05 DIAGNOSIS — R05 Cough: Secondary | ICD-10-CM

## 2014-10-05 MED ORDER — LEVOFLOXACIN 750 MG PO TABS
750.0000 mg | ORAL_TABLET | Freq: Once | ORAL | Status: AC
  Administered 2014-10-05: 750 mg via ORAL
  Filled 2014-10-05: qty 1

## 2014-10-05 MED ORDER — LEVOFLOXACIN 750 MG PO TABS: 750.0000 mg | ORAL_TABLET | Freq: Every day | ORAL | Status: DC

## 2014-10-05 NOTE — ED Provider Notes (Signed)
CSN: [id number]     Arrival date & time 10/04/14  2029 History   First MD Initiated Contact with Patient 10/05/14 0024     Chief Complaint  Patient presents with  . Cough     (Consider location/radiation/quality/duration/timing/severity/associated sxs/prior Treatment) HPI [name] is a 58 y.o. female with past medical history of structure sleep apnea, CHF, diabetes and coronary artery disease coming in with coughing. Patient states has sick contacts in her family who had developed pneumonia one by one. She states her symptoms occurred 2 days ago with subjective fevers, nonproductive cough and viral URI symptoms. Patient has chest pain with her cough, she denies worsening shortness of breath. She denies worsening swelling in her legs. She wears 2 L nasal cannula at home and that has not changed. He has made her symptoms better or worse. She denies any urinary symptoms or changes in her bowel movements. Patient has no further complaints.  10 Systems reviewed and are negative for acute change except as noted in the HPI.     Past Medical History  Diagnosis Date  . OSA (obstructive sleep apnea)   . CHF (congestive heart failure)   . Diabetes mellitus   . Altered mental status     2nd to hypercapnea  . RLS (restless legs syndrome)   . Acute and chronic respiratory failure with hypercapnia   . Hypoxemia   . Endometrial cancer   . Morbid obesity   . Depression   . Hypothyroidism   . GERD (gastroesophageal reflux disease)   . Nephrolithiasis   . Chronic low back pain   . ADD (attention deficit disorder)   . Restless leg syndrome   . Coronary artery disease   . Complication of anesthesia     " I AM SLOW MTO WAKE UP AT ITMES "  . Hypertension   . Hypercapnia    Past Surgical History  Procedure Laterality Date  . Back surgery      x2  . Shoulder surgery      x2  . Vaginal hysterectomy      x2  . Dental surgery    . Tonsillectomy    . Sp perc nephrostomy     Family  History  Problem Relation Age of Onset  . Cancer Father    History  Substance Use Topics  . Smoking status: Former Smoker -- 0.50 packs/day for 2 years  . Smokeless tobacco: Never Used     Comment: quit in teenage years  . Alcohol Use: No   OB History   Grav Para Term Preterm Abortions TAB SAB Ect Mult Living                 Review of Systems    Allergies  Aspirin; Nsaids; Other; Plavix; Sudafed; Erythromycin; Sulfa antibiotics; Sulfamethoxazole-trimethoprim; and Altace  Home Medications   Prior to Admission medications   Medication Sig Start Date End Date Taking? Authorizing Provider  b complex vitamins tablet Take 1 tablet by mouth daily.      Historical Provider, MD  BIOTIN PO Take 1 tablet by mouth 2 (two) times daily.    Historical Provider, MD  cetirizine (ZYRTEC) 10 MG tablet Take 10 mg by mouth at bedtime.    Historical Provider, MD  diltiazem (CARDIZEM CD) 240 MG 24 hr capsule Take 1 capsule (240 mg total) by mouth daily. 04/13/14   Birdie Riddle, MD  furosemide (LASIX) 40 MG tablet Take 1 tablet (40 mg total) by mouth 2 (  two) times daily. 04/13/14   Birdie Riddle, MD  lansoprazole (PREVACID) 30 MG capsule Take 30 mg by mouth at bedtime.    Historical Provider, MD  levothyroxine (SYNTHROID, LEVOTHROID) 75 MCG tablet Take 75 mcg by mouth daily.      Historical Provider, MD  losartan (COZAAR) 50 MG tablet Take 50 mg by mouth daily.     Historical Provider, MD  methadone (DOLOPHINE) 10 MG/5ML solution Take 70 mg by mouth every morning.     Historical Provider, MD  mupirocin ointment (BACTROBAN) 2 % Place 1 application into the nose 2 (two) times daily. 04/13/14   Birdie Riddle, MD  neomycin-bacitracin-polymyxin (NEOSPORIN) OINT Apply 1 application topically as needed for wound care.    Historical Provider, MD  oxymetazoline (AFRIN) 0.05 % nasal spray Place 1 spray into both nostrils 2 (two) times daily as needed for congestion.    Historical Provider, MD  potassium chloride  SA (K-DUR,KLOR-CON) 20 MEQ tablet Take 20 mEq by mouth 2 (two) times daily.    Historical Provider, MD  propranolol (INDERAL) 40 MG tablet Take 40 mg by mouth 2 (two) times daily.    Historical Provider, MD  sertraline (ZOLOFT) 100 MG tablet Take 200 mg by mouth at bedtime.     Historical Provider, MD  tolnaftate (TINACTIN) 1 % external solution Apply 1 application topically 2 (two) times daily as needed (for rash). Apply to toe nails    Historical Provider, MD   BP 128/60  Pulse 88  Temp(Src) 98.4 F (36.9 C) (Oral)  Resp 18  Ht 5\' 5"  (1.651 m)  Wt 270 lb (122.471 kg)  BMI 44.93 kg/m2  SpO2 87% Physical Exam  Nursing note and vitals reviewed. Constitutional: She is oriented to person, place, and time. She appears well-developed and well-nourished. No distress.  Obese female  HENT:  Head: Normocephalic and atraumatic.  Nose: Nose normal.  Mouth/Throat: Oropharynx is clear and moist. No oropharyngeal exudate.  Nasal cannula in place.  Eyes: Conjunctivae and EOM are normal. Pupils are equal, round, and reactive to light. No scleral icterus.  Neck: Normal range of motion. Neck supple. No JVD present. No tracheal deviation present. No thyromegaly present.  Cardiovascular: Normal rate, regular rhythm and normal heart sounds.  Exam reveals no gallop and no friction rub.   No murmur heard. Pulmonary/Chest: Effort normal and breath sounds normal. No respiratory distress. She has no wheezes. She exhibits no tenderness.  Abdominal: Soft. Bowel sounds are normal. She exhibits no distension and no mass. There is no tenderness. There is no rebound and no guarding.  Musculoskeletal: Normal range of motion. She exhibits edema. She exhibits no tenderness.  Lymphadenopathy:    She has no cervical adenopathy.  Neurological: She is alert and oriented to person, place, and time. No cranial nerve deficit. She exhibits normal muscle tone.  Skin: Skin is warm and dry. No rash noted. She is not diaphoretic.  No erythema. No pallor.    ED Course  Procedures (including critical care time) Labs Review Labs Reviewed  CBC WITH DIFFERENTIAL - Abnormal; Notable for the following:    WBC 14.3 (*)    MCH 25.9 (*)    Neutrophils Relative % 88 (*)    Neutro Abs 12.7 (*)    Lymphocytes Relative 5 (*)    All other components within normal limits  COMPREHENSIVE METABOLIC PANEL - Abnormal; Notable for the following:    Chloride 94 (*)    CO2 36 (*)  Glucose, Bld 126 (*)    Albumin 3.0 (*)    GFR calc non Af Amer 63 (*)    GFR calc Af Amer 73 (*)    All other components within normal limits    Imaging Review Dg Chest 2 View  10/04/2014   CLINICAL DATA:  Initial evaluation for shortness of breath, hypoxemia, cough.  EXAM: CHEST  2 VIEW  COMPARISON:  Prior radiograph from 04/11/2014  FINDINGS: Cardiac and mediastinal silhouettes are within normal limits.  Lungs are mildly hypoinflated. Focal infiltrate present within the left lower lobe, most compatible with pneumonia. Additional patchy infiltrate present within the left upper lobe also concerning for infection. Right lung is clear. No pulmonary edema or pleural effusion. No pneumothorax.  No acute osseus abnormality.  IMPRESSION: Patchy infiltrates within the left upper and lower lobes, concerning for multi focal pneumonia.   Electronically Signed   By: Jeannine Boga M.D.   On: 10/04/2014 22:48     EKG Interpretation None      MDM   Final diagnoses:  Cough    Patient % emergency department for cough and subjective fevers. White count is 14.3, chest x-ray reveals multifocal pneumonia in the left upper and lower lobes. And has no respiratory distress and appears comfortable in bed with her home 2 L nasal cannula. Her oxygen saturation is 100%. Patient will be given Levaquin for treatment and discharged with a prescription for the same. She is advised to return for any worsening follow-up with her primary care physician within 3 days. I  have low concern for ACS in this patient given her history of cough causing her chest pain. Her vital signs remained within her normal limits and she is safe for discharge.    Everlene Balls, MD 10/05/14 [phone]

## 2014-10-05 NOTE — Discharge Instructions (Signed)
Pneumonia, Adult [name], he was seen today for coughing and fever. Your x-ray reveals a pneumonia. Take medication as prescribed. It is very important for you to follow-up with your primary care physician within 2 days for continued treatment. If you have any worsening come back to the emergency department immediately for repeat evaluation. Thank you. Pneumonia is an infection of the lungs. It may be caused by a germ (virus or bacteria). Some types of pneumonia can spread easily from person to person. This can happen when you cough or sneeze. HOME CARE  Only take medicine as told by your doctor.  Take your medicine (antibiotics) as told. Finish it even if you start to feel better.  Do not smoke.  You may use a vaporizer or humidifier in your room. This can help loosen thick spit (mucus).  Sleep so you are almost sitting up (semi-upright). This helps reduce coughing.  Rest. A shot (vaccine) can help prevent pneumonia. Shots are often advised for:  People over 27 years old.  Patients on chemotherapy.  People with long-term (chronic) lung problems.  People with immune system problems. GET HELP RIGHT AWAY IF:   You are getting worse.  You cannot control your cough, and you are losing sleep.  You cough up blood.  Your pain gets worse, even with medicine.  You have a fever.  Any of your problems are getting worse, not better.  You have shortness of breath or chest pain. MAKE SURE YOU:   Understand these instructions.  Will watch your condition.  Will get help right away if you are not doing well or get worse. Document Released: 05/15/2008 Document Revised: 02/19/2012 Document Reviewed: 02/17/2011 [facility] Patient Information 2015 [place], [place]. This information is not intended to replace advice given to you by your health care provider. Make sure you discuss any questions you have with your health care provider.

## 2014-10-08 ENCOUNTER — Encounter: Payer: Self-pay | Admitting: Pulmonary Disease

## 2014-10-08 ENCOUNTER — Ambulatory Visit (INDEPENDENT_AMBULATORY_CARE_PROVIDER_SITE_OTHER): Payer: Medicare Other | Admitting: Pulmonary Disease

## 2014-10-08 VITALS — BP 128/70 | HR 55 | Temp 97.7°F | Ht 65.0 in | Wt 266.0 lb

## 2014-10-08 DIAGNOSIS — G4733 Obstructive sleep apnea (adult) (pediatric): Secondary | ICD-10-CM | POA: Diagnosis not present

## 2014-10-08 DIAGNOSIS — J189 Pneumonia, unspecified organism: Secondary | ICD-10-CM | POA: Diagnosis not present

## 2014-10-08 DIAGNOSIS — J9612 Chronic respiratory failure with hypercapnia: Secondary | ICD-10-CM | POA: Diagnosis not present

## 2014-10-08 NOTE — Assessment & Plan Note (Signed)
She was in hospital earlier this month for pneumonia.  Clinically improving with Abx.  Will set up f/u CXR for 1 month to document clearing of infiltrates.

## 2014-10-08 NOTE — Patient Instructions (Signed)
Will schedule chest xray for 1 month from now Will schedule sleep study and call with results Follow up in 4 months

## 2014-10-08 NOTE — Assessment & Plan Note (Signed)
Related to obesity hypoventilation, OSA, and chronic narcotic use.  She is to continue 2 liters oxygen 24/7 for now.  Will re-assess her oxygen needs after her sleep study.

## 2014-10-08 NOTE — Assessment & Plan Note (Signed)
She has hx of severe sleep apnea.  She has been off therapy.  She has snoring, sleep disruption, daytime sleepiness, and witnessed apnea.  She likely still has sleep apnea.  She will need repeat in lab sleep study prior to arranging for new supplies.

## 2014-10-08 NOTE — Progress Notes (Signed)
Chief Complaint  Patient presents with  . Follow-up    Pt states that she has not used CPAP x  1.5years. Pt would like to restart.     History of Present Illness: [name] is a 58 y.o. female OSA/OHS.  I last saw her in 2013.  She has not used BiPAP for about 1.5 yrs.  She still has machine.  She uses 2 liters oxygen 24/7.  She was in hospital recently for pneumonia >> finishing Abx.  She is not having as much cough or chest congestion.  She has snoring, sleep disruption w/o BiPAP.  She has been told she stops breathing and wakes up feeling like she can't catch her breath.  She gets sleep and naps during the day.  She denies chest pain, or leg swelling.    TESTS: Auto BPAP 11/13/11 to 11/26/11>>Used on 12 of 14 days with average 2 hrs 17 min.  Average AHI 28 (8.6 obstructive, 19.6 central) with median setting 13/9 cm H2O, 95th percentile setting 15/11 cm H2O. PSG 03/26/12>>AHI 36.6, SpO2 low 91%.  Test done with 2 liters oxygen.  PLMI 3.1.  PVC's, bigeminy. BiPAP 05/23/12 to 07/08/12>>Used on 47 of 47 nights with average 4 hrs 48 min. Average AHI 11 with BiPAP 13/9 cm H2O. ABG 07/07/13 >> pH 7.34, PaCO2 59.4, PaO2 82 Echo 04/13/14 >> EF 45 to 50%, mild MR, severe LA dilation   PMHx, PSHx, Medications, Allergies, Fhx, Shx reviewed.  Physical Exam:  General - Obese, using supplemental oxygen HEENT - no sinus tenderness, no oral exudate, no LAN Cardiac - s1s2 regular Chest - scattered rhonchi Abdomen - soft, nontender Extremities - no edema Skin - no rashes Neurologic - normal strength Psychiatric - normal mood, behavior   Dg Chest 2 View  10/04/2014   CLINICAL DATA:  Initial evaluation for shortness of breath, hypoxemia, cough.  EXAM: CHEST  2 VIEW  COMPARISON:  Prior radiograph from 04/11/2014  FINDINGS: Cardiac and mediastinal silhouettes are within normal limits.  Lungs are mildly hypoinflated. Focal infiltrate present within the left lower lobe, most compatible with  pneumonia. Additional patchy infiltrate present within the left upper lobe also concerning for infection. Right lung is clear. No pulmonary edema or pleural effusion. No pneumothorax.  No acute osseus abnormality.  IMPRESSION: Patchy infiltrates within the left upper and lower lobes, concerning for multi focal pneumonia.   Electronically Signed   By: Jeannine Boga M.D.   On: 10/04/2014 22:48    Assessment/Plan:  Chesley Mires, MD [place] 10/08/2014, 9:59 AM Pager:  [phone]

## 2014-11-08 ENCOUNTER — Encounter (HOSPITAL_COMMUNITY): Payer: Self-pay | Admitting: *Deleted

## 2014-11-08 ENCOUNTER — Inpatient Hospital Stay (HOSPITAL_COMMUNITY)
Admission: EM | Admit: 2014-11-08 | Discharge: 2014-11-11 | DRG: 194 | Disposition: A | Discharge status: Home / Self Care | Payer: Medicare Other | Attending: Cardiovascular Disease | Admitting: Cardiovascular Disease

## 2014-11-08 ENCOUNTER — Emergency Department (HOSPITAL_COMMUNITY): Payer: Medicare Other

## 2014-11-08 DIAGNOSIS — I5022 Chronic systolic (congestive) heart failure: Secondary | ICD-10-CM | POA: Diagnosis not present

## 2014-11-08 DIAGNOSIS — E119 Type 2 diabetes mellitus without complications: Secondary | ICD-10-CM | POA: Diagnosis present

## 2014-11-08 DIAGNOSIS — Z7982 Long term (current) use of aspirin: Secondary | ICD-10-CM

## 2014-11-08 DIAGNOSIS — E039 Hypothyroidism, unspecified: Secondary | ICD-10-CM | POA: Diagnosis not present

## 2014-11-08 DIAGNOSIS — I480 Paroxysmal atrial fibrillation: Secondary | ICD-10-CM | POA: Diagnosis not present

## 2014-11-08 DIAGNOSIS — E662 Morbid (severe) obesity with alveolar hypoventilation: Secondary | ICD-10-CM | POA: Diagnosis present

## 2014-11-08 DIAGNOSIS — N132 Hydronephrosis with renal and ureteral calculous obstruction: Secondary | ICD-10-CM | POA: Diagnosis present

## 2014-11-08 DIAGNOSIS — N200 Calculus of kidney: Secondary | ICD-10-CM | POA: Diagnosis not present

## 2014-11-08 DIAGNOSIS — F329 Major depressive disorder, single episode, unspecified: Secondary | ICD-10-CM | POA: Diagnosis present

## 2014-11-08 DIAGNOSIS — Z6841 Body Mass Index (BMI) 40.0 and over, adult: Secondary | ICD-10-CM

## 2014-11-08 DIAGNOSIS — R112 Nausea with vomiting, unspecified: Secondary | ICD-10-CM | POA: Diagnosis not present

## 2014-11-08 DIAGNOSIS — J9611 Chronic respiratory failure with hypoxia: Secondary | ICD-10-CM | POA: Diagnosis not present

## 2014-11-08 DIAGNOSIS — I10 Essential (primary) hypertension: Secondary | ICD-10-CM | POA: Diagnosis not present

## 2014-11-08 DIAGNOSIS — M549 Dorsalgia, unspecified: Secondary | ICD-10-CM | POA: Diagnosis present

## 2014-11-08 DIAGNOSIS — J189 Pneumonia, unspecified organism: Secondary | ICD-10-CM | POA: Diagnosis not present

## 2014-11-08 DIAGNOSIS — R0600 Dyspnea, unspecified: Secondary | ICD-10-CM | POA: Diagnosis not present

## 2014-11-08 DIAGNOSIS — E669 Obesity, unspecified: Secondary | ICD-10-CM | POA: Diagnosis not present

## 2014-11-08 DIAGNOSIS — J811 Chronic pulmonary edema: Secondary | ICD-10-CM | POA: Diagnosis not present

## 2014-11-08 DIAGNOSIS — R109 Unspecified abdominal pain: Secondary | ICD-10-CM

## 2014-11-08 DIAGNOSIS — G4733 Obstructive sleep apnea (adult) (pediatric): Secondary | ICD-10-CM | POA: Diagnosis present

## 2014-11-08 DIAGNOSIS — R1084 Generalized abdominal pain: Secondary | ICD-10-CM | POA: Diagnosis not present

## 2014-11-08 DIAGNOSIS — G2581 Restless legs syndrome: Secondary | ICD-10-CM | POA: Diagnosis present

## 2014-11-08 DIAGNOSIS — K2970 Gastritis, unspecified, without bleeding: Secondary | ICD-10-CM | POA: Diagnosis not present

## 2014-11-08 DIAGNOSIS — R0602 Shortness of breath: Secondary | ICD-10-CM | POA: Diagnosis not present

## 2014-11-08 DIAGNOSIS — J449 Chronic obstructive pulmonary disease, unspecified: Secondary | ICD-10-CM | POA: Diagnosis present

## 2014-11-08 DIAGNOSIS — G8929 Other chronic pain: Secondary | ICD-10-CM | POA: Diagnosis present

## 2014-11-08 DIAGNOSIS — K219 Gastro-esophageal reflux disease without esophagitis: Secondary | ICD-10-CM | POA: Diagnosis present

## 2014-11-08 DIAGNOSIS — M1990 Unspecified osteoarthritis, unspecified site: Secondary | ICD-10-CM | POA: Diagnosis present

## 2014-11-08 DIAGNOSIS — I252 Old myocardial infarction: Secondary | ICD-10-CM

## 2014-11-08 DIAGNOSIS — Z87891 Personal history of nicotine dependence: Secondary | ICD-10-CM

## 2014-11-08 DIAGNOSIS — I2510 Atherosclerotic heart disease of native coronary artery without angina pectoris: Secondary | ICD-10-CM | POA: Diagnosis present

## 2014-11-08 DIAGNOSIS — J9612 Chronic respiratory failure with hypercapnia: Secondary | ICD-10-CM | POA: Diagnosis present

## 2014-11-08 LAB — CBC WITH DIFFERENTIAL/PLATELET
BASOS ABS: 0 10*3/uL (ref 0.0–0.1)
Basophils Relative: 0 % (ref 0–1)
Eosinophils Absolute: 0 10*3/uL (ref 0.0–0.7)
Eosinophils Relative: 0 % (ref 0–5)
HCT: 38.1 % (ref 36.0–46.0)
HEMOGLOBIN: 11.1 g/dL — AB (ref 12.0–15.0)
Lymphocytes Relative: 5 % — ABNORMAL LOW (ref 12–46)
Lymphs Abs: 0.6 10*3/uL — ABNORMAL LOW (ref 0.7–4.0)
MCH: 25.2 pg — ABNORMAL LOW (ref 26.0–34.0)
MCHC: 29.1 g/dL — AB (ref 30.0–36.0)
MCV: 86.6 fL (ref 78.0–100.0)
Monocytes Absolute: 0.7 10*3/uL (ref 0.1–1.0)
Monocytes Relative: 6 % (ref 3–12)
NEUTROS ABS: 11 10*3/uL — AB (ref 1.7–7.7)
NEUTROS PCT: 89 % — AB (ref 43–77)
Platelets: 233 10*3/uL (ref 150–400)
RBC: 4.4 MIL/uL (ref 3.87–5.11)
RDW: 15 % (ref 11.5–15.5)
WBC: 12.3 10*3/uL — ABNORMAL HIGH (ref 4.0–10.5)

## 2014-11-08 LAB — URINALYSIS, ROUTINE W REFLEX MICROSCOPIC
Bilirubin Urine: NEGATIVE
GLUCOSE, UA: NEGATIVE mg/dL
Ketones, ur: NEGATIVE mg/dL
Leukocytes, UA: NEGATIVE
Nitrite: NEGATIVE
PH: 5 (ref 5.0–8.0)
Protein, ur: NEGATIVE mg/dL
SPECIFIC GRAVITY, URINE: 1.014 (ref 1.005–1.030)
UROBILINOGEN UA: 0.2 mg/dL (ref 0.0–1.0)

## 2014-11-08 LAB — URINE MICROSCOPIC-ADD ON

## 2014-11-08 LAB — COMPREHENSIVE METABOLIC PANEL
ALBUMIN: 3.2 g/dL — AB (ref 3.5–5.2)
ALK PHOS: 106 U/L (ref 39–117)
ALT: 13 U/L (ref 0–35)
ANION GAP: 8 (ref 5–15)
AST: 20 U/L (ref 0–37)
BILIRUBIN TOTAL: 0.4 mg/dL (ref 0.3–1.2)
BUN: 14 mg/dL (ref 6–23)
CHLORIDE: 94 meq/L — AB (ref 96–112)
CO2: 36 mEq/L — ABNORMAL HIGH (ref 19–32)
Calcium: 9.4 mg/dL (ref 8.4–10.5)
Creatinine, Ser: 0.91 mg/dL (ref 0.50–1.10)
GFR calc Af Amer: 78 mL/min — ABNORMAL LOW (ref >90)
GFR calc non Af Amer: 67 mL/min — ABNORMAL LOW (ref >90)
Glucose, Bld: 130 mg/dL — ABNORMAL HIGH (ref 70–99)
POTASSIUM: 4.4 meq/L (ref 3.7–5.3)
Sodium: 138 mEq/L (ref 137–147)
Total Protein: 7.4 g/dL (ref 6.0–8.3)

## 2014-11-08 LAB — LACTIC ACID, PLASMA: LACTIC ACID, VENOUS: 1.1 mmol/L (ref 0.5–2.2)

## 2014-11-08 LAB — TROPONIN I

## 2014-11-08 LAB — PRO B NATRIURETIC PEPTIDE: Pro B Natriuretic peptide (BNP): 2113 pg/mL — ABNORMAL HIGH (ref 0–125)

## 2014-11-08 LAB — LIPASE, BLOOD: Lipase: 16 U/L (ref 11–59)

## 2014-11-08 MED ORDER — PROCHLORPERAZINE MALEATE 10 MG PO TABS
10.0000 mg | ORAL_TABLET | Freq: Once | ORAL | Status: AC
  Administered 2014-11-08: 10 mg via ORAL
  Filled 2014-11-08: qty 1

## 2014-11-08 MED ORDER — HYDROMORPHONE HCL 1 MG/ML IJ SOLN
1.0000 mg | Freq: Once | INTRAMUSCULAR | Status: DC
  Filled 2014-11-08: qty 1

## 2014-11-08 MED ORDER — ONDANSETRON HCL 4 MG/2ML IJ SOLN
4.0000 mg | Freq: Once | INTRAMUSCULAR | Status: DC
  Filled 2014-11-08: qty 2

## 2014-11-08 MED ORDER — IOHEXOL 300 MG/ML  SOLN
50.0000 mL | Freq: Once | INTRAMUSCULAR | Status: AC | PRN
  Administered 2014-11-08: 50 mL via ORAL

## 2014-11-08 MED ORDER — IOHEXOL 300 MG/ML  SOLN
100.0000 mL | Freq: Once | INTRAMUSCULAR | Status: AC | PRN
  Administered 2014-11-08: 100 mL via INTRAVENOUS

## 2014-11-08 NOTE — ED Notes (Signed)
Patient one assist with cane to restroom at this time.

## 2014-11-08 NOTE — ED Notes (Signed)
Patient transported to CT 

## 2014-11-08 NOTE — ED Notes (Signed)
Pharm tech at bedside 

## 2014-11-08 NOTE — ED Notes (Signed)
Per EMS pt coming from home with c/o abdominal pain, nausea and vomiting x 2 days. Pt denies diarrhea, fever.

## 2014-11-08 NOTE — ED Provider Notes (Signed)
CSN: [id number]     Arrival date & time 11/08/14  1859 History   First MD Initiated Contact with Patient 11/08/14 1954     Chief Complaint  Patient presents with  . Abdominal Pain     (Consider location/radiation/quality/duration/timing/severity/associated sxs/prior Treatment) Patient is a 58 y.o. female presenting with abdominal pain.  Abdominal Pain Pain location:  Generalized Pain quality: aching   Pain radiates to:  Does not radiate Pain severity:  Moderate Onset quality:  Gradual Duration:  2 days Timing:  Constant Progression:  Worsening Chronicity:  Recurrent Context: eating   Relieved by:  Nothing Worsened by:  Movement, palpation and eating Ineffective treatments:  None tried Associated symptoms: anorexia, nausea and vomiting   Associated symptoms: no dysuria and no fever     Past Medical History  Diagnosis Date  . OSA (obstructive sleep apnea)   . CHF (congestive heart failure)   . Diabetes mellitus   . Altered mental status     2nd to hypercapnea  . RLS (restless legs syndrome)   . Acute and chronic respiratory failure with hypercapnia   . Hypoxemia   . Endometrial cancer   . Morbid obesity   . Depression   . Hypothyroidism   . GERD (gastroesophageal reflux disease)   . Nephrolithiasis   . Chronic low back pain   . ADD (attention deficit disorder)   . Restless leg syndrome   . Coronary artery disease   . Complication of anesthesia     " I AM SLOW MTO WAKE UP AT ITMES "  . Hypertension   . Hypercapnia    Past Surgical History  Procedure Laterality Date  . Back surgery      x2  . Shoulder surgery      x2  . Vaginal hysterectomy      x2  . Dental surgery    . Tonsillectomy    . Sp perc nephrostomy     Family History  Problem Relation Age of Onset  . Cancer Father    History  Substance Use Topics  . Smoking status: Former Smoker -- 0.50 packs/day for 2 years    Types: Cigarettes    Quit date: 09/10/1974  . Smokeless tobacco: Never  Used     Comment: quit in teenage years  . Alcohol Use: No   OB History    No data available     Review of Systems  Constitutional: Negative for fever.  Gastrointestinal: Positive for nausea, vomiting, abdominal pain and anorexia.  Genitourinary: Negative for dysuria.  All other systems reviewed and are negative.     Allergies  Aspirin; Eliquis; Nsaids; Other; Plavix; Sudafed; Erythromycin; Sulfa antibiotics; Sulfamethoxazole-trimethoprim; Altace; and Zofran  Home Medications   Prior to Admission medications   Medication Sig Start Date End Date Taking? Authorizing Provider  levothyroxine (SYNTHROID, LEVOTHROID) 75 MCG tablet Take 75 mcg by mouth daily.     Yes Historical Provider, MD  losartan (COZAAR) 50 MG tablet Take 50 mg by mouth daily.    Yes Historical Provider, MD  b complex vitamins tablet Take 1 tablet by mouth daily.     Historical Provider, MD  BIOTIN PO Take 1 tablet by mouth 2 (two) times daily.    Historical Provider, MD  cetirizine (ZYRTEC) 10 MG tablet Take 10 mg by mouth at bedtime.    Historical Provider, MD  clotrimazole-betamethasone (LOTRISONE) cream Apply 1 application topically 2 (two) times daily.    Historical Provider, MD  diltiazem (TIAZAC) 360 MG  24 hr capsule Take 360 mg by mouth daily.    Historical Provider, MD  furosemide (LASIX) 40 MG tablet Take 1 tablet (40 mg total) by mouth 2 (two) times daily. 04/13/14   Birdie Riddle, MD  lansoprazole (PREVACID) 30 MG capsule Take 30 mg by mouth at bedtime.    Historical Provider, MD  levofloxacin (LEVAQUIN) 750 MG tablet Take 1 tablet (750 mg total) by mouth daily. X 7 days 10/05/14   Everlene Balls, MD  methadone (DOLOPHINE) 10 MG/5ML solution Take 70 mg by mouth every morning.     Historical Provider, MD  nystatin (MYCOSTATIN/NYSTOP) 100000 UNIT/GM POWD Apply 1 g topically 2 (two) times daily as needed (for rash).    Historical Provider, MD  oxymetazoline (AFRIN) 0.05 % nasal spray Place 1 spray into both  nostrils 2 (two) times daily as needed for congestion.    Historical Provider, MD  potassium chloride SA (K-DUR,KLOR-CON) 20 MEQ tablet Take 20 mEq by mouth 2 (two) times daily.    Historical Provider, MD  propranolol (INDERAL) 40 MG tablet Take 40 mg by mouth 2 (two) times daily.    Historical Provider, MD  sertraline (ZOLOFT) 100 MG tablet Take 200 mg by mouth at bedtime.     Historical Provider, MD   BP 167/75 mmHg  Pulse 74  Temp(Src) 98.1 F (36.7 C) (Oral)  Resp 23  SpO2 97% Physical Exam  Constitutional: She is oriented to person, place, and time. She appears well-developed and well-nourished.  HENT:  Head: Normocephalic and atraumatic.  Right Ear: External ear normal.  Left Ear: External ear normal.  Eyes: Conjunctivae and EOM are normal. Pupils are equal, round, and reactive to light.  Neck: Normal range of motion. Neck supple.  Cardiovascular: Normal rate, regular rhythm, normal heart sounds and intact distal pulses.   Pulmonary/Chest: Effort normal and breath sounds normal.  Abdominal: Soft. Bowel sounds are normal. There is generalized tenderness.  Musculoskeletal: Normal range of motion.  Neurological: She is alert and oriented to person, place, and time.  Skin: Skin is warm and dry.  Vitals reviewed.   ED Course  Procedures (including critical care time) Labs Review Labs Reviewed  CBC WITH DIFFERENTIAL - Abnormal; Notable for the following:    WBC 12.3 (*)    Hemoglobin 11.1 (*)    MCH 25.2 (*)    MCHC 29.1 (*)    Neutrophils Relative % 89 (*)    Neutro Abs 11.0 (*)    Lymphocytes Relative 5 (*)    Lymphs Abs 0.6 (*)    All other components within normal limits  COMPREHENSIVE METABOLIC PANEL - Abnormal; Notable for the following:    Chloride 94 (*)    CO2 36 (*)    Glucose, Bld 130 (*)    Albumin 3.2 (*)    GFR calc non Af Amer 67 (*)    GFR calc Af Amer 78 (*)    All other components within normal limits  URINALYSIS, ROUTINE W REFLEX MICROSCOPIC -  Abnormal; Notable for the following:    Hgb urine dipstick MODERATE (*)    All other components within normal limits  PRO B NATRIURETIC PEPTIDE - Abnormal; Notable for the following:    Pro B Natriuretic peptide (BNP) 2113.0 (*)    All other components within normal limits  URINE MICROSCOPIC-ADD ON - Abnormal; Notable for the following:    Crystals URIC ACID CRYSTALS (*)    All other components within normal limits  LIPASE, BLOOD  LACTIC ACID, PLASMA  TROPONIN I    Imaging Review Dg Chest 2 View  11/09/2014   CLINICAL DATA:  Acute onset of chest pain and shortness of breath. Dyspnea.  EXAM: CHEST  2 VIEW  COMPARISON:  Chest radiograph from 10/04/2014  FINDINGS: The lungs are well-aerated. Vascular congestion is noted. Mild right upper lung zone and left mid lung zone airspace opacities raise concern for pneumonia, though interstitial edema could have a similar appearance. No pleural effusion or pneumothorax is seen. There is no evidence of pleural effusion or pneumothorax.  The heart is borderline enlarged. No acute osseous abnormalities are seen.  IMPRESSION: Vascular congestion and borderline cardiomegaly noted. Mild right upper lung zone and left mid lung zone airspace opacities raise concern for pneumonia, though interstitial edema could have a similar appearance.   Electronically Signed   By: Garald Balding M.D.   On: 11/09/2014 00:21   Ct Abdomen Pelvis W Contrast  11/08/2014   CLINICAL DATA:  Abdominal pain, nausea and vomiting for 2 days.  EXAM: CT ABDOMEN AND PELVIS WITH CONTRAST  TECHNIQUE: Multidetector CT imaging of the abdomen and pelvis was performed using the standard protocol following bolus administration of intravenous contrast.  CONTRAST:  67mL OMNIPAQUE IOHEXOL 300 MG/ML SOLN, 137mL OMNIPAQUE IOHEXOL 300 MG/ML SOLN  COMPARISON:  CT of the abdomen and pelvis February 06, 2013  FINDINGS: LUNG BASES: Patchy centrilobular ground-glass nodules RIGHT middle lobe, RIGHT lower lobe  without pleural effusion. Heart size appears normal. Mild coronary artery calcification. No pericardial fluid collection.  SOLID ORGANS: The liver, spleen, gallbladder, pancreas and adrenal glands are unremarkable.  GASTROINTESTINAL TRACT: The stomach, small and large bowel are normal in course and caliber without inflammatory changes. The appendix is not discretely identified, however there are no inflammatory changes in the right lower quadrant.  KIDNEYS/ URINARY TRACT: 2.4 x 1.4 cm RIGHT renal pelvis staghorn calculus, RIGHT upper pole cysts with chronic mild hydronephrosis. Retrograde casting of the calculus in addition to 6 mm RIGHT lower pole nephrolithiasis. RIGHT uroepithelial stranding. Mild RIGHT renal atrophy/cortical thinning. Punctate nonobstructing LEFT lower pole nephrolithiasis. Delayed imaging demonstrates prompt symmetric excretion of contrast into the proximal urinary collecting system. Urinary bladder is partially distended, harboring no intravesicular calculi.  PERITONEUM/RETROPERITONEUM: No intraperitoneal free fluid nor free air. Aortoiliac vessels are normal in course and caliber. No lymphadenopathy by CT size criteria. Status post hysterectomy.  SOFT TISSUE/OSSEOUS STRUCTURES: Multilevel severe degenerative discs of the lumbar spine resulting in severe L5-S1 neural foraminal narrowing.  IMPRESSION: Stable RIGHT staghorn calculus with chronic mild RIGHT hydronephrosis and RIGHT kidney atrophy. Similar stranding about the RIGHT renal collecting system though, superimposed urinary tract infection is a consideration. Punctate nonobstructing LEFT nephrolithiasis.  Partially imaged RIGHT middle lobe, RIGHT lower pole centrilobular ground-glass nodules which is concerning for pneumonia, or possible inflammatory changes.   Electronically Signed   By: Elon Alas   On: 11/08/2014 23:05     EKG Interpretation   Date/Time:  Sunday November 08 2014 20:38:13 EST Ventricular Rate:  71 PR  Interval:  139 QRS Duration: 76 QT Interval:  446 QTC Calculation: 485 R Axis:   44 Text Interpretation:  Sinus rhythm Low voltage, extremity and precordial  leads Baseline wander in lead(s) V1 V2 afib has resolved since last  tracing Confirmed by Debby Freiberg [phone]) on 11/09/2014 12:36:25 AM      MDM   Final diagnoses:  Abdominal pain, acute  Dyspnea    58 y.o. female with pertinent PMH of  OSA, CHF, home O2 of 2 L, chronic respiratory failure presents with abdominal pain described as generalized aching and nausea. She states she's been more short of breath and normal. She denies fever, productive cough, rest or symptoms. She's had no diarrhea or cuts patient. Arrival today vitals signs and physical exam as above. Obtain CT scan of the abdomen which demonstrated stable nephrolithiasis, hydronephrosis.  It also demonstrated questionable consolidation in the right lower lung base.  Chest x-ray obtained and with question interstitial edema versus pneumonia. Patient was given Rocephin and azithromycin. Consulted her primary care doctor who will admit.    1. Abdominal pain, acute   2. Dyspnea    3. CAP 4. Nephrolithiasis     Debby Freiberg, MD 11/09/14 [phone]

## 2014-11-09 DIAGNOSIS — Z7982 Long term (current) use of aspirin: Secondary | ICD-10-CM | POA: Diagnosis not present

## 2014-11-09 DIAGNOSIS — N200 Calculus of kidney: Secondary | ICD-10-CM | POA: Diagnosis present

## 2014-11-09 DIAGNOSIS — J189 Pneumonia, unspecified organism: Secondary | ICD-10-CM | POA: Diagnosis present

## 2014-11-09 DIAGNOSIS — Z6841 Body Mass Index (BMI) 40.0 and over, adult: Secondary | ICD-10-CM | POA: Diagnosis not present

## 2014-11-09 DIAGNOSIS — R0600 Dyspnea, unspecified: Secondary | ICD-10-CM | POA: Diagnosis not present

## 2014-11-09 DIAGNOSIS — J9611 Chronic respiratory failure with hypoxia: Secondary | ICD-10-CM | POA: Diagnosis present

## 2014-11-09 DIAGNOSIS — M549 Dorsalgia, unspecified: Secondary | ICD-10-CM | POA: Diagnosis present

## 2014-11-09 DIAGNOSIS — I252 Old myocardial infarction: Secondary | ICD-10-CM | POA: Diagnosis not present

## 2014-11-09 DIAGNOSIS — G2581 Restless legs syndrome: Secondary | ICD-10-CM | POA: Diagnosis present

## 2014-11-09 DIAGNOSIS — I5022 Chronic systolic (congestive) heart failure: Secondary | ICD-10-CM | POA: Diagnosis present

## 2014-11-09 DIAGNOSIS — J9612 Chronic respiratory failure with hypercapnia: Secondary | ICD-10-CM | POA: Diagnosis present

## 2014-11-09 DIAGNOSIS — E039 Hypothyroidism, unspecified: Secondary | ICD-10-CM | POA: Diagnosis present

## 2014-11-09 DIAGNOSIS — I480 Paroxysmal atrial fibrillation: Secondary | ICD-10-CM | POA: Diagnosis present

## 2014-11-09 DIAGNOSIS — N132 Hydronephrosis with renal and ureteral calculous obstruction: Secondary | ICD-10-CM | POA: Diagnosis present

## 2014-11-09 DIAGNOSIS — I10 Essential (primary) hypertension: Secondary | ICD-10-CM | POA: Diagnosis present

## 2014-11-09 DIAGNOSIS — E119 Type 2 diabetes mellitus without complications: Secondary | ICD-10-CM | POA: Diagnosis present

## 2014-11-09 DIAGNOSIS — E662 Morbid (severe) obesity with alveolar hypoventilation: Secondary | ICD-10-CM | POA: Diagnosis present

## 2014-11-09 DIAGNOSIS — K219 Gastro-esophageal reflux disease without esophagitis: Secondary | ICD-10-CM | POA: Diagnosis present

## 2014-11-09 DIAGNOSIS — I2510 Atherosclerotic heart disease of native coronary artery without angina pectoris: Secondary | ICD-10-CM | POA: Diagnosis present

## 2014-11-09 DIAGNOSIS — Z87891 Personal history of nicotine dependence: Secondary | ICD-10-CM | POA: Diagnosis not present

## 2014-11-09 DIAGNOSIS — G8929 Other chronic pain: Secondary | ICD-10-CM | POA: Diagnosis present

## 2014-11-09 DIAGNOSIS — E669 Obesity, unspecified: Secondary | ICD-10-CM | POA: Diagnosis not present

## 2014-11-09 DIAGNOSIS — G4733 Obstructive sleep apnea (adult) (pediatric): Secondary | ICD-10-CM | POA: Diagnosis present

## 2014-11-09 DIAGNOSIS — F329 Major depressive disorder, single episode, unspecified: Secondary | ICD-10-CM | POA: Diagnosis present

## 2014-11-09 DIAGNOSIS — J449 Chronic obstructive pulmonary disease, unspecified: Secondary | ICD-10-CM | POA: Diagnosis present

## 2014-11-09 DIAGNOSIS — M1990 Unspecified osteoarthritis, unspecified site: Secondary | ICD-10-CM | POA: Diagnosis present

## 2014-11-09 LAB — CREATININE, SERUM
CREATININE: 0.96 mg/dL (ref 0.50–1.10)
GFR calc Af Amer: 73 mL/min — ABNORMAL LOW (ref >90)
GFR calc non Af Amer: 63 mL/min — ABNORMAL LOW (ref >90)

## 2014-11-09 LAB — GLUCOSE, CAPILLARY
GLUCOSE-CAPILLARY: 104 mg/dL — AB (ref 70–99)
Glucose-Capillary: 127 mg/dL — ABNORMAL HIGH (ref 70–99)
Glucose-Capillary: 114 mg/dL — ABNORMAL HIGH (ref 70–99)

## 2014-11-09 LAB — CBC
HCT: 35.7 % — ABNORMAL LOW (ref 36.0–46.0)
HEMOGLOBIN: 10.6 g/dL — AB (ref 12.0–15.0)
MCH: 25.5 pg — ABNORMAL LOW (ref 26.0–34.0)
MCHC: 29.7 g/dL — ABNORMAL LOW (ref 30.0–36.0)
MCV: 85.8 fL (ref 78.0–100.0)
PLATELETS: 226 10*3/uL (ref 150–400)
RBC: 4.16 MIL/uL (ref 3.87–5.11)
RDW: 15 % (ref 11.5–15.5)
WBC: 11.2 10*3/uL — ABNORMAL HIGH (ref 4.0–10.5)

## 2014-11-09 LAB — HEMOGLOBIN A1C
Hgb A1c MFr Bld: 6.5 % — ABNORMAL HIGH (ref <5.7)
MEAN PLASMA GLUCOSE: 140 mg/dL — AB (ref <117)

## 2014-11-09 LAB — MRSA PCR SCREENING: MRSA BY PCR: NEGATIVE

## 2014-11-09 MED ORDER — SERTRALINE HCL 100 MG PO TABS
200.0000 mg | ORAL_TABLET | Freq: Every day | ORAL | Status: DC
  Administered 2014-11-09 – 2014-11-10 (×3): 200 mg via ORAL
  Filled 2014-11-09 (×2): qty 2
  Filled 2014-11-09: qty 4
  Filled 2014-11-09: qty 2

## 2014-11-09 MED ORDER — METHADONE HCL 10 MG/ML PO CONC
70.0000 mg | Freq: Every morning | ORAL | Status: DC
  Administered 2014-11-09 – 2014-11-11 (×3): 70 mg via ORAL
  Filled 2014-11-09 (×3): qty 7

## 2014-11-09 MED ORDER — CEFTRIAXONE SODIUM IN DEXTROSE 20 MG/ML IV SOLN
1.0000 g | INTRAVENOUS | Status: DC
  Administered 2014-11-09 – 2014-11-10 (×2): 1 g via INTRAVENOUS
  Filled 2014-11-09 (×3): qty 50

## 2014-11-09 MED ORDER — DEXTROSE 5 % IV SOLN
500.0000 mg | Freq: Once | INTRAVENOUS | Status: AC
  Administered 2014-11-09: 500 mg via INTRAVENOUS
  Filled 2014-11-09: qty 500

## 2014-11-09 MED ORDER — SODIUM CHLORIDE 0.9 % IJ SOLN: 3.0000 mL | Freq: Two times a day (BID) | INTRAMUSCULAR | Status: DC

## 2014-11-09 MED ORDER — DEXTROSE 5 % IV SOLN
1.0000 g | Freq: Once | INTRAVENOUS | Status: AC
  Administered 2014-11-09: 1 g via INTRAVENOUS
  Filled 2014-11-09: qty 10

## 2014-11-09 MED ORDER — INFLUENZA VAC SPLIT QUAD 0.5 ML IM SUSY
0.5000 mL | PREFILLED_SYRINGE | Freq: Once | INTRAMUSCULAR | Status: AC
  Administered 2014-11-09: 0.5 mL via INTRAMUSCULAR
  Filled 2014-11-09: qty 0.5

## 2014-11-09 MED ORDER — DILTIAZEM HCL ER BEADS 240 MG PO CP24
360.0000 mg | ORAL_CAPSULE | Freq: Every day | ORAL | Status: DC
  Administered 2014-11-09 – 2014-11-10 (×2): 360 mg via ORAL
  Filled 2014-11-09 (×2): qty 1

## 2014-11-09 MED ORDER — PROPRANOLOL HCL 40 MG PO TABS
40.0000 mg | ORAL_TABLET | Freq: Two times a day (BID) | ORAL | Status: DC
  Administered 2014-11-09 – 2014-11-10 (×3): 40 mg via ORAL
  Filled 2014-11-09 (×4): qty 1

## 2014-11-09 MED ORDER — PROCHLORPERAZINE EDISYLATE 5 MG/ML IJ SOLN
10.0000 mg | Freq: Once | INTRAMUSCULAR | Status: AC
  Administered 2014-11-09: 10 mg via INTRAVENOUS
  Filled 2014-11-09: qty 2

## 2014-11-09 MED ORDER — CETYLPYRIDINIUM CHLORIDE 0.05 % MT LIQD
7.0000 mL | Freq: Two times a day (BID) | OROMUCOSAL | Status: DC
  Administered 2014-11-09 – 2014-11-11 (×5): 7 mL via OROMUCOSAL

## 2014-11-09 MED ORDER — SODIUM CHLORIDE 0.9 % IJ SOLN
3.0000 mL | Freq: Two times a day (BID) | INTRAMUSCULAR | Status: DC
  Administered 2014-11-09 – 2014-11-11 (×4): 3 mL via INTRAVENOUS

## 2014-11-09 MED ORDER — CLOTRIMAZOLE 1 % EX CREA
TOPICAL_CREAM | Freq: Two times a day (BID) | CUTANEOUS | Status: DC
  Administered 2014-11-09 – 2014-11-10 (×4): via TOPICAL
  Filled 2014-11-09: qty 15

## 2014-11-09 MED ORDER — LORATADINE 10 MG PO TABS
10.0000 mg | ORAL_TABLET | Freq: Every day | ORAL | Status: DC
  Administered 2014-11-09 – 2014-11-11 (×3): 10 mg via ORAL
  Filled 2014-11-09 (×3): qty 1

## 2014-11-09 MED ORDER — FUROSEMIDE 10 MG/ML IJ SOLN
40.0000 mg | Freq: Once | INTRAMUSCULAR | Status: AC
  Administered 2014-11-09: 40 mg via INTRAVENOUS
  Filled 2014-11-09: qty 4

## 2014-11-09 MED ORDER — HEPARIN SODIUM (PORCINE) 5000 UNIT/ML IJ SOLN
5000.0000 [IU] | Freq: Three times a day (TID) | INTRAMUSCULAR | Status: DC
  Filled 2014-11-09 (×4): qty 1

## 2014-11-09 MED ORDER — SODIUM CHLORIDE 0.9 % IJ SOLN: 3.0000 mL | INTRAMUSCULAR | Status: DC | PRN

## 2014-11-09 MED ORDER — SODIUM CHLORIDE 0.9 % IV SOLN: 250.0000 mL | INTRAVENOUS | Status: DC | PRN

## 2014-11-09 MED ORDER — LEVOTHYROXINE SODIUM 75 MCG PO TABS
75.0000 ug | ORAL_TABLET | Freq: Every day | ORAL | Status: DC
  Administered 2014-11-09 – 2014-11-11 (×3): 75 ug via ORAL
  Filled 2014-11-09 (×4): qty 1

## 2014-11-09 MED ORDER — POTASSIUM CHLORIDE CRYS ER 20 MEQ PO TBCR
20.0000 meq | EXTENDED_RELEASE_TABLET | Freq: Two times a day (BID) | ORAL | Status: DC
  Administered 2014-11-09 – 2014-11-11 (×6): 20 meq via ORAL
  Filled 2014-11-09 (×6): qty 1

## 2014-11-09 MED ORDER — PANTOPRAZOLE SODIUM 20 MG PO TBEC
20.0000 mg | DELAYED_RELEASE_TABLET | Freq: Every day | ORAL | Status: DC
  Administered 2014-11-09 – 2014-11-11 (×3): 20 mg via ORAL
  Filled 2014-11-09 (×3): qty 1

## 2014-11-09 MED ORDER — LOSARTAN POTASSIUM 50 MG PO TABS
50.0000 mg | ORAL_TABLET | Freq: Every day | ORAL | Status: DC
Start: 2014-11-09 — End: 2014-11-11
  Administered 2014-11-09 – 2014-11-11 (×3): 50 mg via ORAL
  Filled 2014-11-09 (×3): qty 1

## 2014-11-09 NOTE — Progress Notes (Signed)
Echocardiogram 2D Echocardiogram has been performed.  [name] 11/09/2014, 10:14 AM

## 2014-11-09 NOTE — ED Notes (Signed)
Patient c/o generalized abd pain, described as achy. Patient states she has had 3-4 episodes of loose stools, and 5-6 episodes of vomiting, with continued nausea. Patient was offered Dilaudid and Zofran, patient declines. Patient states she takes Methadone and can not take anything else for pain and that she is allergic to Zofran, will update allergy list. Patient grandson at bedside.

## 2014-11-09 NOTE — H&P (Signed)
Referring Physician:  [name] is an 58 y.o. female.                       Chief Complaint: Abdominal discomfort  HPI: 58 year old female with Type 2 Diabetes, restless leg syndrome, morbid obesity, depression, nephrolithiasis, obstructive sleep apnea and chronic left heart diastolic failure has epigastric abdominal discomfort with some nausea, cough and shortness of breath x 2 days. No fever or chest pain. Chest x-ray suggestive of pneumonia.  Past Medical History  Diagnosis Date  . OSA (obstructive sleep apnea)   . CHF (congestive heart failure)   . Diabetes mellitus   . Altered mental status     2nd to hypercapnea  . RLS (restless legs syndrome)   . Acute and chronic respiratory failure with hypercapnia   . Hypoxemia   . Endometrial cancer   . Morbid obesity   . Depression   . Hypothyroidism   . GERD (gastroesophageal reflux disease)   . Nephrolithiasis   . Chronic low back pain   . ADD (attention deficit disorder)   . Restless leg syndrome   . Coronary artery disease   . Complication of anesthesia     " I AM SLOW MTO WAKE UP AT ITMES "  . Hypertension   . Hypercapnia       Past Surgical History  Procedure Laterality Date  . Back surgery      x2  . Shoulder surgery      x2  . Vaginal hysterectomy      x2  . Dental surgery    . Tonsillectomy    . Sp perc nephrostomy      Family History  Problem Relation Age of Onset  . Cancer Father    Social History:  reports that she quit smoking about 40 years ago. Her smoking use included Cigarettes. She has a 1 pack-year smoking history. She has never used smokeless tobacco. She reports that she does not drink alcohol or use illicit drugs.  Allergies:  Allergies  Allergen Reactions  . Aspirin Other (See Comments)    Chest pain  . Eliquis [Apixaban]     Excessive bleeding  . Nsaids Other (See Comments)    Chest pain  . Other Other (See Comments)    "BP med" combined with plavix, caused syncope  . Plavix  [Clopidogrel] Other (See Comments)    syncope  . Sudafed [Pseudoephedrine] Palpitations    Raised BP  . Erythromycin Nausea And Vomiting  . Sulfa Antibiotics Other (See Comments)    constipation  . Sulfamethoxazole-Trimethoprim Diarrhea  . Altace [Ramipril] Other (See Comments)    Severe fatigue  . Zofran [Ondansetron Hcl] Other (See Comments)    "I put me into a coma"     (Not in a hospital admission)  Results for orders placed or performed during the hospital encounter of 11/08/14 (from the past 48 hour([name]))  CBC with Differential     Status: Abnormal   Collection Time: 11/08/14  7:17 PM  Result Value Ref Range   WBC 12.3 (H) 4.0 - 10.5 K/uL   RBC 4.40 3.87 - 5.11 MIL/uL   Hemoglobin 11.1 (L) 12.0 - 15.0 g/dL   HCT 38.1 36.0 - 46.0 %   MCV 86.6 78.0 - 100.0 fL   MCH 25.2 (L) 26.0 - 34.0 pg   MCHC 29.1 (L) 30.0 - 36.0 g/dL   RDW 15.0 11.5 - 15.5 %   Platelets 233 150 -  400 K/uL   Neutrophils Relative % 89 (H) 43 - 77 %   Neutro Abs 11.0 (H) 1.7 - 7.7 K/uL   Lymphocytes Relative 5 (L) 12 - 46 %   Lymphs Abs 0.6 (L) 0.7 - 4.0 K/uL   Monocytes Relative 6 3 - 12 %   Monocytes Absolute 0.7 0.1 - 1.0 K/uL   Eosinophils Relative 0 0 - 5 %   Eosinophils Absolute 0.0 0.0 - 0.7 K/uL   Basophils Relative 0 0 - 1 %   Basophils Absolute 0.0 0.0 - 0.1 K/uL  Comprehensive metabolic panel     Status: Abnormal   Collection Time: 11/08/14  7:17 PM  Result Value Ref Range   Sodium 138 137 - 147 mEq/L   Potassium 4.4 3.7 - 5.3 mEq/L   Chloride 94 (L) 96 - 112 mEq/L   CO2 36 (H) 19 - 32 mEq/L   Glucose, Bld 130 (H) 70 - 99 mg/dL   BUN 14 6 - 23 mg/dL   Creatinine, Ser 0.91 0.50 - 1.10 mg/dL   Calcium 9.4 8.4 - 10.5 mg/dL   Total Protein 7.4 6.0 - 8.3 g/dL   Albumin 3.2 (L) 3.5 - 5.2 g/dL   AST 20 0 - 37 U/L   ALT 13 0 - 35 U/L   Alkaline Phosphatase 106 39 - 117 U/L   Total Bilirubin 0.4 0.3 - 1.2 mg/dL   GFR calc non Af Amer 67 (L) >90 mL/min   GFR calc Af Amer 78 (L) >90 mL/min     Comment: (NOTE) The eGFR has been calculated using the CKD EPI equation. This calculation has not been validated in all clinical situations. eGFR'[name] persistently <90 mL/min signify possible Chronic Kidney Disease.    Anion gap 8 5 - 15  Lipase, blood     Status: None   Collection Time: 11/08/14  7:17 PM  Result Value Ref Range   Lipase 16 11 - 59 U/L  Troponin I     Status: None   Collection Time: 11/08/14  7:17 PM  Result Value Ref Range   Troponin I <0.30 <0.30 ng/mL    Comment:        Due to the release kinetics of cTnI, a negative result within the first hours of the onset of symptoms does not rule out myocardial infarction with certainty. If myocardial infarction is still suspected, repeat the test at appropriate intervals.   Pro b natriuretic peptide     Status: Abnormal   Collection Time: 11/08/14  7:17 PM  Result Value Ref Range   Pro B Natriuretic peptide (BNP) 2113.0 (H) 0 - 125 pg/mL  Urinalysis, Routine w reflex microscopic     Status: Abnormal   Collection Time: 11/08/14  7:42 PM  Result Value Ref Range   Color, Urine YELLOW YELLOW   APPearance CLEAR CLEAR   Specific Gravity, Urine 1.014 1.005 - 1.030   pH 5.0 5.0 - 8.0   Glucose, UA NEGATIVE NEGATIVE mg/dL   Hgb urine dipstick MODERATE (A) NEGATIVE   Bilirubin Urine NEGATIVE NEGATIVE   Ketones, ur NEGATIVE NEGATIVE mg/dL   Protein, ur NEGATIVE NEGATIVE mg/dL   Urobilinogen, UA 0.2 0.0 - 1.0 mg/dL   Nitrite NEGATIVE NEGATIVE   Leukocytes, UA NEGATIVE NEGATIVE  Urine microscopic-add on     Status: Abnormal   Collection Time: 11/08/14  7:42 PM  Result Value Ref Range   Squamous Epithelial / LPF RARE RARE   RBC / HPF 3-6 <3 RBC/hpf  Crystals URIC ACID CRYSTALS (A) NEGATIVE  Lactic acid, plasma     Status: None   Collection Time: 11/08/14  8:28 PM  Result Value Ref Range   Lactic Acid, Venous 1.1 0.5 - 2.2 mmol/L   Dg Chest 2 View  11/09/2014   CLINICAL DATA:  Acute onset of chest pain and shortness  of breath. Dyspnea.  EXAM: CHEST  2 VIEW  COMPARISON:  Chest radiograph from 10/04/2014  FINDINGS: The lungs are well-aerated. Vascular congestion is noted. Mild right upper lung zone and left mid lung zone airspace opacities raise concern for pneumonia, though interstitial edema could have a similar appearance. No pleural effusion or pneumothorax is seen. There is no evidence of pleural effusion or pneumothorax.  The heart is borderline enlarged. No acute osseous abnormalities are seen.  IMPRESSION: Vascular congestion and borderline cardiomegaly noted. Mild right upper lung zone and left mid lung zone airspace opacities raise concern for pneumonia, though interstitial edema could have a similar appearance.   Electronically Signed   By: Garald Balding M.D.   On: 11/09/2014 00:21   Ct Abdomen Pelvis W Contrast  11/08/2014   CLINICAL DATA:  Abdominal pain, nausea and vomiting for 2 days.  EXAM: CT ABDOMEN AND PELVIS WITH CONTRAST  TECHNIQUE: Multidetector CT imaging of the abdomen and pelvis was performed using the standard protocol following bolus administration of intravenous contrast.  CONTRAST:  40mL OMNIPAQUE IOHEXOL 300 MG/ML SOLN, 168mL OMNIPAQUE IOHEXOL 300 MG/ML SOLN  COMPARISON:  CT of the abdomen and pelvis February 06, 2013  FINDINGS: LUNG BASES: Patchy centrilobular ground-glass nodules RIGHT middle lobe, RIGHT lower lobe without pleural effusion. Heart size appears normal. Mild coronary artery calcification. No pericardial fluid collection.  SOLID ORGANS: The liver, spleen, gallbladder, pancreas and adrenal glands are unremarkable.  GASTROINTESTINAL TRACT: The stomach, small and large bowel are normal in course and caliber without inflammatory changes. The appendix is not discretely identified, however there are no inflammatory changes in the right lower quadrant.  KIDNEYS/ URINARY TRACT: 2.4 x 1.4 cm RIGHT renal pelvis staghorn calculus, RIGHT upper pole cysts with chronic mild hydronephrosis.  Retrograde casting of the calculus in addition to 6 mm RIGHT lower pole nephrolithiasis. RIGHT uroepithelial stranding. Mild RIGHT renal atrophy/cortical thinning. Punctate nonobstructing LEFT lower pole nephrolithiasis. Delayed imaging demonstrates prompt symmetric excretion of contrast into the proximal urinary collecting system. Urinary bladder is partially distended, harboring no intravesicular calculi.  PERITONEUM/RETROPERITONEUM: No intraperitoneal free fluid nor free air. Aortoiliac vessels are normal in course and caliber. No lymphadenopathy by CT size criteria. Status post hysterectomy.  SOFT TISSUE/OSSEOUS STRUCTURES: Multilevel severe degenerative discs of the lumbar spine resulting in severe L5-S1 neural foraminal narrowing.  IMPRESSION: Stable RIGHT staghorn calculus with chronic mild RIGHT hydronephrosis and RIGHT kidney atrophy. Similar stranding about the RIGHT renal collecting system though, superimposed urinary tract infection is a consideration. Punctate nonobstructing LEFT nephrolithiasis.  Partially imaged RIGHT middle lobe, RIGHT lower pole centrilobular ground-glass nodules which is concerning for pneumonia, or possible inflammatory changes.   Electronically Signed   By: Elon Alas   On: 11/08/2014 23:05    Review Of Systems Constitutional: Positive for malaise/fatigue. Negative for fever and chills.  HENT: Negative for hearing loss.  Eyes: Negative for double vision and photophobia.  Respiratory: Negative for cough, hemoptysis, sputum production and shortness of breath.  Cardiovascular: Negative for chest pain, palpitations and orthopnea.  Gastrointestinal: Positive for nausea and vomiting. Negative for abdominal pain.  Genitourinary: Negative for dysuria.  Neurological: Positive  for dizziness and weakness. Negative for headaches.  Blood pressure 167/82, pulse 64, temperature 98.1 F (36.7 C), temperature source Oral, resp. rate 14, SpO2 95 %. Physical Exam   Constitutional: She appears well-developed and well-nourished. No distress at rest. HENT: Head: Normocephalic and atraumatic. Right Ear: External ear normal. Left Ear: External ear normal.  Eyes: Conjunctivae and EOM are normal. Pupils are equal, round, and reactive to light.  Neck: Normal range of motion. Neck supple. No JVD at 90 degree angle. Cardiovascular: Normal rate, regular rhythm, normal heart sounds and intact distal pulses.  Pulmonary/Chest: Effort normal and breath sounds normal.  Abdominal: Soft. Bowel sounds are normal. There is generalized tenderness.  Musculoskeletal: Normal range of motion. 2 + lower leg edema. Neurological: She is alert and oriented to person, place, and time. Moves all 4 extremities. Skin: Skin is warm and dry.  Assessment/Plan Possible pneumonia Hypertension  CAD  History of silent inferior wall MI in the past  History of congestive heart failure secondary to preserved LV systolic function  History of paroxysmal atrial fibrillation Diabetes mellitus, II  Obstructive sleep apnea/obesity hypoventilation syndrome on CPAP  Chronic hypoxic/hypercarbic respiratory failure  COPD  Remote tobacco abuse  Degenerative joint disease  Hypothyroidism  Morbid obesity  Restless leg syndrome  Admit/antibiotics/Home medications  Anna Biggar S, MD  11/09/2014, 1:54 AM

## 2014-11-09 NOTE — Plan of Care (Signed)
Problem: Phase I Progression Outcomes Goal: Tolerating diet Outcome: Completed/Met Date Met:  11/09/14

## 2014-11-09 NOTE — Plan of Care (Signed)
Problem: Phase I Progression Outcomes Goal: Progress activity as tolerated unless otherwise ordered Outcome: Completed/Met Date Met:  11/09/14     

## 2014-11-09 NOTE — Plan of Care (Signed)
Problem: Phase I Progression Outcomes Goal: Hemodynamically stable Outcome: Completed/Met Date Met:  11/09/14     

## 2014-11-09 NOTE — Plan of Care (Signed)
Problem: Phase I Progression Outcomes Goal: O2 sats > or equal 90% or at baseline Outcome: Completed/Met Date Met:  11/09/14 Goal: Dyspnea controlled at rest Outcome: Completed/Met Date Met:  11/09/14 Goal: Flu/PneumoVaccines if indicated Outcome: Completed/Met Date Met:  11/09/14

## 2014-11-10 LAB — CBC
HCT: 33.9 % — ABNORMAL LOW (ref 36.0–46.0)
Hemoglobin: 10.5 g/dL — ABNORMAL LOW (ref 12.0–15.0)
MCH: 26.3 pg (ref 26.0–34.0)
MCHC: 31 g/dL (ref 30.0–36.0)
MCV: 84.8 fL (ref 78.0–100.0)
PLATELETS: 206 10*3/uL (ref 150–400)
RBC: 4 MIL/uL (ref 3.87–5.11)
RDW: 15.2 % (ref 11.5–15.5)
WBC: 9.5 10*3/uL (ref 4.0–10.5)

## 2014-11-10 LAB — GLUCOSE, CAPILLARY
GLUCOSE-CAPILLARY: 79 mg/dL (ref 70–99)
GLUCOSE-CAPILLARY: 119 mg/dL — AB (ref 70–99)
Glucose-Capillary: 107 mg/dL — ABNORMAL HIGH (ref 70–99)
Glucose-Capillary: 136 mg/dL — ABNORMAL HIGH (ref 70–99)
Glucose-Capillary: 111 mg/dL — ABNORMAL HIGH (ref 70–99)

## 2014-11-10 LAB — BASIC METABOLIC PANEL
ANION GAP: 10 (ref 5–15)
BUN: 14 mg/dL (ref 6–23)
CALCIUM: 9.1 mg/dL (ref 8.4–10.5)
CO2: 39 mEq/L — ABNORMAL HIGH (ref 19–32)
CREATININE: 1.03 mg/dL (ref 0.50–1.10)
Chloride: 94 mEq/L — ABNORMAL LOW (ref 96–112)
GFR, EST AFRICAN AMERICAN: 67 mL/min — AB (ref >90)
GFR, EST NON AFRICAN AMERICAN: 58 mL/min — AB (ref >90)
Glucose, Bld: 97 mg/dL (ref 70–99)
Potassium: 4.3 mEq/L (ref 3.7–5.3)
SODIUM: 143 meq/L (ref 137–147)

## 2014-11-10 LAB — IRON AND TIBC
IRON: 17 ug/dL — AB (ref 42–135)
Saturation Ratios: 5 % — ABNORMAL LOW (ref 20–55)
TIBC: 363 ug/dL (ref 250–470)
UIBC: 346 ug/dL (ref 125–400)

## 2014-11-10 LAB — OCCULT BLOOD X 1 CARD TO LAB, STOOL: Fecal Occult Bld: NEGATIVE

## 2014-11-10 LAB — FERRITIN: Ferritin: 115 ng/mL (ref 10–291)

## 2014-11-10 LAB — CLOSTRIDIUM DIFFICILE BY PCR: CDIFFPCR: NEGATIVE

## 2014-11-10 MED ORDER — OXYMETAZOLINE HCL 0.05 % NA SOLN
1.0000 | Freq: Two times a day (BID) | NASAL | Status: DC | PRN
  Administered 2014-11-10: 1 via NASAL
  Filled 2014-11-10: qty 15

## 2014-11-10 MED ORDER — PROPRANOLOL HCL 40 MG PO TABS
40.0000 mg | ORAL_TABLET | Freq: Two times a day (BID) | ORAL | Status: DC
  Administered 2014-11-11: 40 mg via ORAL
  Filled 2014-11-10 (×3): qty 1

## 2014-11-10 MED ORDER — DILTIAZEM HCL ER BEADS 240 MG PO CP24: 360.0000 mg | ORAL_CAPSULE | Freq: Every day | ORAL | Status: DC

## 2014-11-10 MED ORDER — DILTIAZEM HCL ER 240 MG PO CP24
240.0000 mg | ORAL_CAPSULE | Freq: Every day | ORAL | Status: DC
  Administered 2014-11-11: 240 mg via ORAL
  Filled 2014-11-10: qty 1

## 2014-11-10 NOTE — Plan of Care (Signed)
Problem: Phase I Progression Outcomes Goal: Discharge plan established Outcome: Progressing     

## 2014-11-10 NOTE — Progress Notes (Signed)
Ref: Birdie Riddle, MD   Subjective:  Feeling better. Breathing improved. T max 99.5 F  Objective:  Vital Signs in the last 24 hours: Temp:  [98 F (36.7 C)-99.5 F (37.5 C)] 98 F (36.7 C) (12/01 1514) Pulse Rate:  [53-62] 53 (12/01 1514) Cardiac Rhythm:  [-] Sinus bradycardia (11/30 2002) Resp:  [18-20] 20 (12/01 1514) BP: (125-145)/(55-63) 127/55 mmHg (12/01 1514) SpO2:  [95 %-100 %] 95 % (12/01 1514) Weight:  [121.5 kg (267 lb 13.7 oz)] 121.5 kg (267 lb 13.7 oz) (12/01 0700)  Physical Exam: BP Readings from Last 1 Encounters:  11/10/14 127/55    Wt Readings from Last 1 Encounters:  11/10/14 121.5 kg (267 lb 13.7 oz)    Weight change: -3.8 kg (-8 lb 6 oz)  HEENT: [place]/AT, Eyes-Blue PERL, EOMI, Conjunctiva-Pale pink, Sclera-Non-icteric Neck: No JVD, No bruit, Trachea midline. Lungs:  Clear, Bilateral. Cardiac:  Regular rhythm, normal S1 and S2, no S3. II/VI systolic murmur. Abdomen:  Soft, non-tender. Extremities:  1 + edema present. No cyanosis. No clubbing. CNS: AxOx3, Cranial nerves grossly intact, moves all 4 extremities. Right handed. Skin: Warm and dry.   Intake/Output from previous day: 11/30 0701 - 12/01 0700 In: 720 [P.O.:720] Out: 1550 [Urine:1550]    Lab Results: BMET    Component Value Date/Time   NA 143 11/10/2014 0442   NA 138 11/08/2014 1917   NA 139 10/04/2014 2106   K 4.3 11/10/2014 0442   K 4.4 11/08/2014 1917   K 4.4 10/04/2014 2106   CL 94* 11/10/2014 0442   CL 94* 11/08/2014 1917   CL 94* 10/04/2014 2106   CO2 39* 11/10/2014 0442   CO2 36* 11/08/2014 1917   CO2 36* 10/04/2014 2106   GLUCOSE 97 11/10/2014 0442   GLUCOSE 130* 11/08/2014 1917   GLUCOSE 126* 10/04/2014 2106   BUN 14 11/10/2014 0442   BUN 14 11/08/2014 1917   BUN 21 10/04/2014 2106   CREATININE 1.03 11/10/2014 0442   CREATININE 0.96 11/09/2014 0454   CREATININE 0.91 11/08/2014 1917   CALCIUM 9.1 11/10/2014 0442   CALCIUM 9.4 11/08/2014 1917   CALCIUM 9.1 10/04/2014  2106   GFRNONAA 58* 11/10/2014 0442   GFRNONAA 63* 11/09/2014 0454   GFRNONAA 67* 11/08/2014 1917   GFRAA 67* 11/10/2014 0442   GFRAA 73* 11/09/2014 0454   GFRAA 78* 11/08/2014 1917   CBC    Component Value Date/Time   WBC 9.5 11/10/2014 0442   RBC 4.00 11/10/2014 0442   HGB 10.5* 11/10/2014 0442   HCT 33.9* 11/10/2014 0442   PLT 206 11/10/2014 0442   MCV 84.8 11/10/2014 0442   MCH 26.3 11/10/2014 0442   MCHC 31.0 11/10/2014 0442   RDW 15.2 11/10/2014 0442   LYMPHSABS 0.6* 11/08/2014 1917   MONOABS 0.7 11/08/2014 1917   EOSABS 0.0 11/08/2014 1917   BASOSABS 0.0 11/08/2014 1917   HEPATIC Function Panel  Recent Labs  04/12/14 0135 10/04/14 2106 11/08/14 1917  PROT 7.0 7.5 7.4   HEMOGLOBIN A1C No components found for: HGA1C,  MPG CARDIAC ENZYMES Lab Results  Component Value Date   CKTOTAL 96 11/12/2007   CKMB 1.8 11/12/2007   TROPONINI <0.30 11/08/2014   TROPONINI <0.30 04/12/2014   TROPONINI <0.30 04/12/2014   BNP  Recent Labs  11/08/14 1917  PROBNP 2113.0*   TSH  Recent Labs  04/12/14 0135  TSH 2.630   CHOLESTEROL  Recent Labs  04/12/14 0740  CHOL 258*    Scheduled Meds: . antiseptic  oral rinse  7 mL Mouth Rinse BID  . cefTRIAXone (ROCEPHIN)  IV  1 g Intravenous Q24H  . clotrimazole   Topical BID  . [START ON 11/11/2014] diltiazem  360 mg Oral Daily  . levothyroxine  75 mcg Oral QAC breakfast  . loratadine  10 mg Oral Daily  . losartan  50 mg Oral Daily  . methadone  70 mg Oral q morning - 10a  . pantoprazole  20 mg Oral Daily  . potassium chloride SA  20 mEq Oral BID  . propranolol  40 mg Oral BID  . sertraline  200 mg Oral QHS  . sodium chloride  3 mL Intravenous Q12H  . sodium chloride  3 mL Intravenous Q12H   Continuous Infusions:  PRN Meds:.sodium chloride, sodium chloride  Assessment/Plan: Pneumonia Hypertension  CAD  History of silent inferior wall MI in the past  History of congestive heart failure secondary to  preserved LV systolic function  History of paroxysmal atrial fibrillation Diabetes mellitus, II  Obstructive sleep apnea/obesity hypoventilation syndrome on CPAP  Chronic hypoxic/hypercarbic respiratory failure  COPD  Remote tobacco abuse  Degenerative joint disease  Hypothyroidism  Morbid obesity  Restless leg syndrome  Continue medications.    LOS: 2 days    Dixie Dials  MD  11/10/2014, 5:29 PM

## 2014-11-10 NOTE — Plan of Care (Signed)
Problem: Phase II Progression Outcomes Goal: Pain controlled on oral analgesia Outcome: Progressing Goal: Dyspnea controlled w/progressive activity Outcome: Progressing

## 2014-11-10 NOTE — Plan of Care (Signed)
Problem: Phase I Progression Outcomes Goal: Pain controlled Outcome: Completed/Met Date Met:  11/10/14

## 2014-11-10 NOTE — Progress Notes (Signed)
Patient heart rated in the high 40s to 50s, patient denies any distress,  notified Dr. Doylene Canard  and he made some adjustments to patient medication. Will continue to monitor patient.

## 2014-11-11 LAB — GLUCOSE, CAPILLARY
GLUCOSE-CAPILLARY: 136 mg/dL — AB (ref 70–99)
Glucose-Capillary: 79 mg/dL (ref 70–99)

## 2014-11-11 MED ORDER — LEVOFLOXACIN 500 MG PO TABS: 500.0000 mg | ORAL_TABLET | Freq: Every day | ORAL | Status: DC

## 2014-11-11 MED ORDER — DILTIAZEM HCL ER 240 MG PO CP24: 240.0000 mg | ORAL_CAPSULE | Freq: Every day | ORAL | Status: DC

## 2014-11-11 MED ORDER — LEVOFLOXACIN 500 MG PO TABS
500.0000 mg | ORAL_TABLET | Freq: Every day | ORAL | Status: DC
  Administered 2014-11-11: 500 mg via ORAL
  Filled 2014-11-11: qty 1

## 2014-11-11 NOTE — Discharge Summary (Signed)
Physician Discharge Summary  Patient ID: ANUSHKA HARTINGER MRN: [id number] DOB/AGE: 1955/12/31 58 y.o.  Admit date: 11/08/2014 Discharge date: 11/11/2014  Admission Diagnoses: Pneumonia Hypertension  CAD  History of silent inferior wall MI in the past  History of congestive heart failure secondary to preserved LV systolic function  History of paroxysmal atrial fibrillation Diabetes mellitus, II  Obstructive sleep apnea/obesity hypoventilation syndrome on CPAP  Chronic hypoxic/hypercarbic respiratory failure  COPD  Remote tobacco abuse  Degenerative joint disease  Hypothyroidism  Morbid obesity  Restless leg syndrome  Discharge Diagnoses:  Principle Problem: * Pneumonia (Community acquired) * Hypertension  CAD  History of silent inferior wall MI in the past  History of congestive heart failure secondary to preserved LV systolic function  History of paroxysmal atrial fibrillation Diabetes mellitus, II  Obstructive sleep apnea/obesity hypoventilation syndrome on CPAP  Chronic hypoxic/hypercarbic respiratory failure  COPD  Remote tobacco abuse  Degenerative joint disease  Hypothyroidism  Morbid obesity  Restless leg syndrome  Discharged Condition: fair  Hospital Course: 58 year old female with Type 2 Diabetes, restless leg syndrome, morbid obesity, depression, nephrolithiasis, obstructive sleep apnea and chronic left heart diastolic failure had epigastric abdominal discomfort with some nausea, cough and shortness of breath x 2 days. No fever or chest pain. Chest x-ray suggestive of pneumonia. She responded to IV antibiotic and IV lasix with significant improvement in breathing. She was discharged home with oral antibiotics. Her diltiazem dose was decreased 33 % for sinus bradycardia. She will be followed in 2 weeks by me.  Consults: cardiology  Significant Diagnostic Studies: labs: Elevated WBC count of 12.3 K improving to 9.5K. HgA1C of 6.5.    Treatments: antibiotics: Levaquin and ceftriaxone. Diltiazem decreased to 240 mg/day. Low iron level of 17 with 5 % saturation.  Discharge Exam: Blood pressure 111/59, pulse 54, temperature 98.1 F (36.7 C), temperature source Oral, resp. rate 18, height 5\' 5"  (1.651 m), weight 121.7 kg (268 lb 4.8 oz), SpO2 94 %. HEENT: [place]/AT, Eyes-Blue PERL, EOMI, Conjunctiva-Pale pink, Sclera-Non-icteric Neck: No JVD, No bruit, Trachea midline. Lungs: Clear, Bilateral. Cardiac: Regular rhythm, normal S1 and S2, no S3. II/VI systolic murmur. Abdomen: Soft, non-tender. Extremities: 1 + edema present. No cyanosis. No clubbing. CNS: AxOx3, Cranial nerves grossly intact, moves all 4 extremities. Right handed. Skin: Warm and dry.  Disposition: 01-Home or Self Care     Medication List    STOP taking these medications        diltiazem 360 MG 24 hr capsule  Commonly known as:  TIAZAC     oxymetazoline 0.05 % nasal spray  Commonly known as:  AFRIN      TAKE these medications        b complex vitamins tablet  Take 1 tablet by mouth daily.     BIOTIN PO  Take 1 tablet by mouth 2 (two) times daily.     cetirizine 10 MG tablet  Commonly known as:  ZYRTEC  Take 10 mg by mouth at bedtime.     clotrimazole-betamethasone cream  Commonly known as:  LOTRISONE  Apply 1 application topically 2 (two) times daily.     diltiazem 240 MG 24 hr capsule  Commonly known as:  DILACOR XR  Take 1 capsule (240 mg total) by mouth daily.     furosemide 40 MG tablet  Commonly known as:  LASIX  Take 1 tablet (40 mg total) by mouth 2 (two) times daily.     lansoprazole 30 MG capsule  Commonly  known as:  PREVACID  Take 30 mg by mouth at bedtime.     levofloxacin 500 MG tablet  Commonly known as:  LEVAQUIN  Take 1 tablet (500 mg total) by mouth daily.     levothyroxine 75 MCG tablet  Commonly known as:  SYNTHROID, LEVOTHROID  Take 75 mcg by mouth daily.     losartan 50 MG tablet  Commonly known  as:  COZAAR  Take 50 mg by mouth daily.     methadone 10 MG/5ML solution  Commonly known as:  DOLOPHINE  Take 70 mg by mouth every morning.     nystatin 100000 UNIT/GM Powd  Apply 1 g topically 2 (two) times daily as needed (for rash).     potassium chloride SA 20 MEQ tablet  Commonly known as:  K-DUR,KLOR-CON  Take 20 mEq by mouth 2 (two) times daily.     prochlorperazine 10 MG tablet  Commonly known as:  COMPAZINE  Take 10 mg by mouth every 6 (six) hours as needed for nausea or vomiting.     propranolol 40 MG tablet  Commonly known as:  INDERAL  Take 40 mg by mouth 2 (two) times daily.     sertraline 100 MG tablet  Commonly known as:  ZOLOFT  Take 200 mg by mouth at bedtime.           Follow-up Information    Follow up with [facility], [place].   Specialty:  Cardiology   Contact information:   [place] [address] [phone]       Signed: Birdie Riddle 11/11/2014, 4:34 PM

## 2014-11-25 DIAGNOSIS — M170 Bilateral primary osteoarthritis of knee: Secondary | ICD-10-CM | POA: Diagnosis not present

## 2014-12-14 ENCOUNTER — Telehealth: Payer: Self-pay | Admitting: *Deleted

## 2014-12-14 NOTE — Telephone Encounter (Signed)
Reminder set up in pt chart last OV 10/08/14- (unable to transfer message over pt chart)- needs appt with VS to follow up on infiltrates x 1 mo with CXR Pt has been contacted multiple times with no success. Have left multiple messages to return call to schedule OV per VS  LM again today to schedule OV

## 2014-12-16 DIAGNOSIS — F331 Major depressive disorder, recurrent, moderate: Secondary | ICD-10-CM | POA: Diagnosis not present

## 2014-12-24 NOTE — Telephone Encounter (Signed)
Pt scheduled for OV with TP 12/29/14 at 3:30 with cxr prior. Nothing further needed.

## 2014-12-28 ENCOUNTER — Encounter (HOSPITAL_BASED_OUTPATIENT_CLINIC_OR_DEPARTMENT_OTHER): Payer: Medicare Other

## 2014-12-29 ENCOUNTER — Ambulatory Visit: Payer: Medicare Other | Admitting: Adult Health

## 2015-02-19 DIAGNOSIS — M170 Bilateral primary osteoarthritis of knee: Secondary | ICD-10-CM | POA: Diagnosis not present

## 2015-02-22 IMAGING — CR DG CHEST 2V
2 series · 2 of 2 positions shown · non-contrast
Comparison: Chest radiograph from 10/04/2014

CLINICAL DATA: Acute onset of chest pain and shortness of breath.
Dyspnea.

EXAM:
CHEST  2 VIEW

[w chest lat]
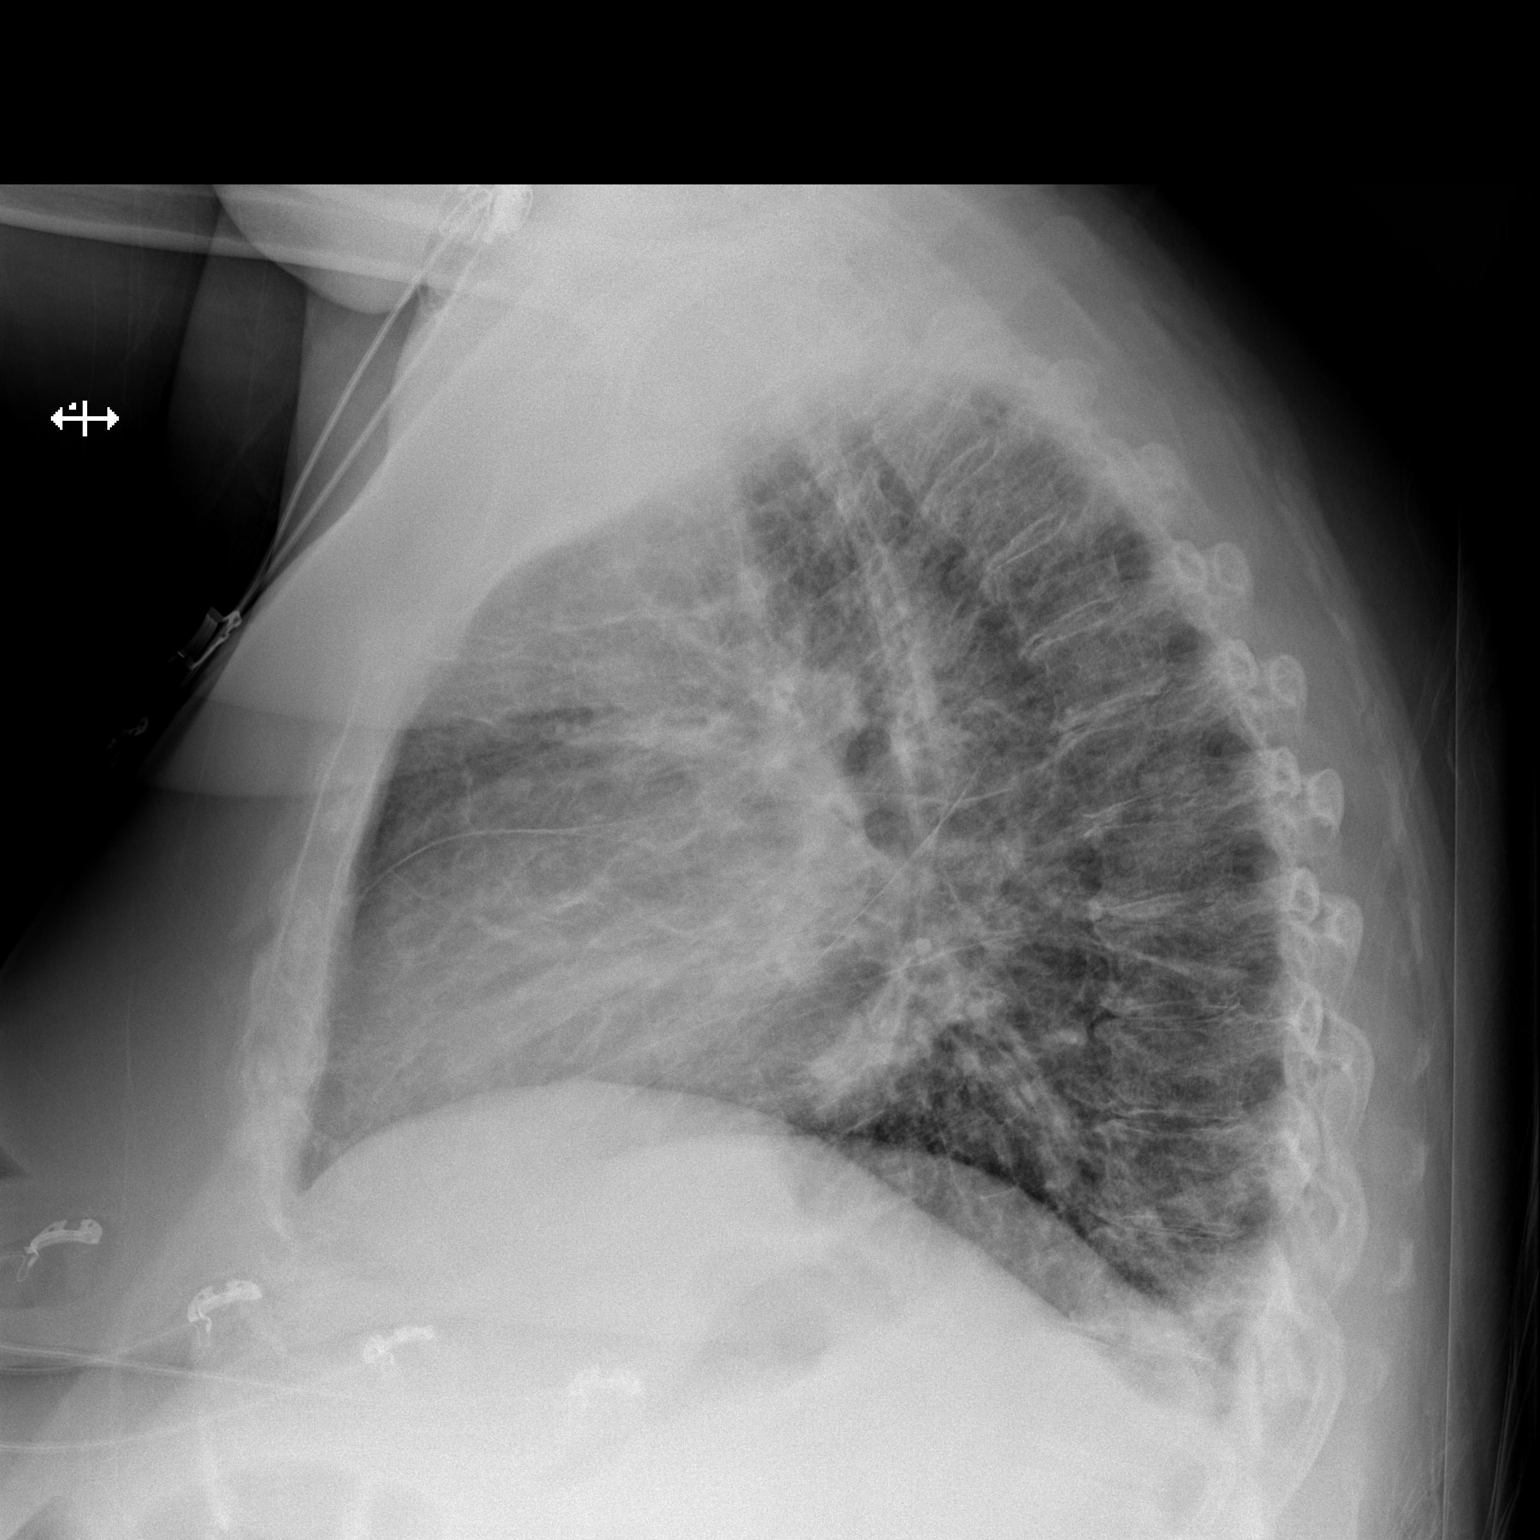

[x chest ap]
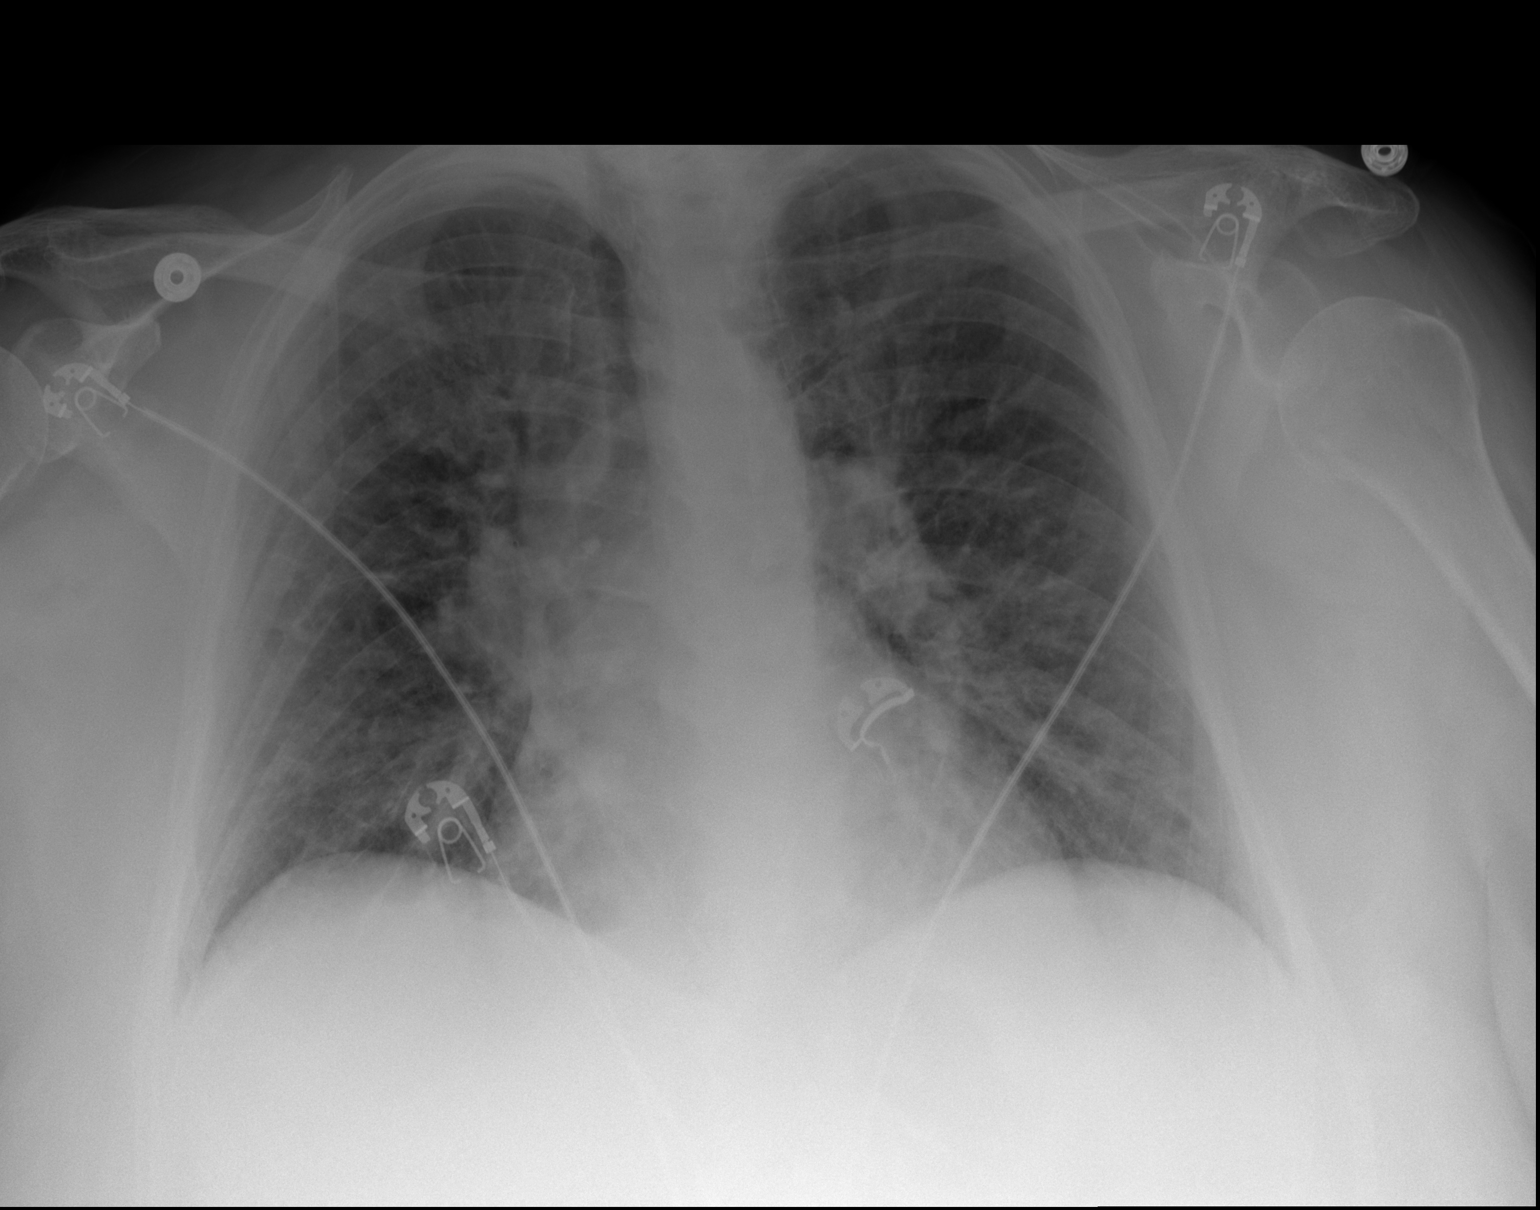

[2 of 2 positions shown; findings below may reference images not displayed]

FINDINGS: The lungs are well-aerated. Vascular congestion is noted. Mild right
upper lung zone and left mid lung zone airspace opacities raise
concern for pneumonia, though interstitial edema could have a
similar appearance. No pleural effusion or pneumothorax is seen.
There is no evidence of pleural effusion or pneumothorax.

The heart is borderline enlarged. No acute osseous abnormalities are
seen.
IMPRESSION: Vascular congestion and borderline cardiomegaly noted. Mild right
upper lung zone and left mid lung zone airspace opacities raise
concern for pneumonia, though interstitial edema could have a
similar appearance.

## 2015-03-02 ENCOUNTER — Encounter (HOSPITAL_BASED_OUTPATIENT_CLINIC_OR_DEPARTMENT_OTHER): Payer: Medicare Other

## 2015-03-03 ENCOUNTER — Ambulatory Visit (INDEPENDENT_AMBULATORY_CARE_PROVIDER_SITE_OTHER): Payer: Medicare Other | Admitting: Pulmonary Disease

## 2015-03-03 ENCOUNTER — Encounter: Payer: Self-pay | Admitting: Pulmonary Disease

## 2015-03-03 VITALS — BP 120/82 | HR 54 | Ht 69.0 in | Wt 279.2 lb

## 2015-03-03 DIAGNOSIS — J9612 Chronic respiratory failure with hypercapnia: Secondary | ICD-10-CM | POA: Diagnosis not present

## 2015-03-03 DIAGNOSIS — E662 Morbid (severe) obesity with alveolar hypoventilation: Secondary | ICD-10-CM | POA: Diagnosis not present

## 2015-03-03 DIAGNOSIS — G4733 Obstructive sleep apnea (adult) (pediatric): Secondary | ICD-10-CM

## 2015-03-03 NOTE — Progress Notes (Signed)
Chief Complaint  Patient presents with  . Follow-up    Pt has sleep study scheduled for May 2016 - pt has been in and out of hospital all Winter and had to cancel multiple test dates.     History of Present Illness: [name] is a 59 y.o. female OSA/OHS.  She had her oxygen bumped up to 3 liters after her hospital stay in November.  She has her sleep study schedule for May.  She denies chest pain, cough, or leg swelling.  TESTS: Auto BPAP 11/13/11 to 11/26/11>>Used on 12 of 14 days with average 2 hrs 17 min.  Average AHI 28 (8.6 obstructive, 19.6 central) with median setting 13/9 cm H2O, 95th percentile setting 15/11 cm H2O. PSG 03/26/12>>AHI 36.6, SpO2 low 91%.  Test done with 2 liters oxygen.  PLMI 3.1.  PVC's, bigeminy. BiPAP 05/23/12 to 07/08/12>>Used on 47 of 47 nights with average 4 hrs 48 min. Average AHI 11 with BiPAP 13/9 cm H2O. ABG 07/07/13 >> pH 7.34, PaCO2 59.4, PaO2 82 Echo 11/09/14 >> EF 55 to 60%, mod MR   PMHx >> DM, RLS, Depression, Hypothyroidism, GERD, ADD, chronic back pain, CAD, HTN, Diastolic CHF, Endometrial cancer  PSHx, Medications, Allergies, Fhx, Shx reviewed.  Physical Exam: Blood pressure 120/82, pulse 54, height 5\' 9"  (1.753 m), weight 279 lb 3.2 oz (126.644 kg), SpO2 95 %. Body mass index is 41.21 kg/(m^2).  General - Obese, using supplemental oxygen HEENT - no sinus tenderness, no oral exudate, no LAN Cardiac - s1s2 regular Chest - scattered rhonchi Abdomen - soft, nontender Extremities - no edema Skin - no rashes Neurologic - normal strength Psychiatric - normal mood, behavior  Assessment/Plan:  Obstructive sleep apnea. Plan: - will call her with results of sleep study in May and then get her set up with new supplies  Chronic hypoxic/hypercapnic respiratory failure 2nd to obesity hypoventilation syndrome. Plan: - continue 3 liters oxygen 24/7  Obesity. Plan: - discussed importance of weight loss   Chesley Mires, MD [place] 03/03/2015, 3:02 PM Pager:  [phone]

## 2015-03-03 NOTE — Patient Instructions (Signed)
Follow up in 6 months 

## 2015-03-08 ENCOUNTER — Ambulatory Visit: Payer: Medicare Other

## 2015-03-09 ENCOUNTER — Telehealth: Payer: Self-pay | Admitting: Pulmonary Disease

## 2015-03-09 NOTE — Telephone Encounter (Signed)
Pt was supposed to come into office for a walk to re-qualify for O2 03/08/15 Pt called and cancelled - did not reschedule  Pt was made aware on 03/04/15 when we last spoke that this walk is very important if she is wanting to get her O2 covered. Without this walk insurance will deny her O2 and she will pay out of pocket. Pt needs to be rescheduled for this walk test ASAP. I have forms that need to be filled out.   LMTCB x 1

## 2015-03-10 NOTE — Telephone Encounter (Signed)
Spoke with the pt and have scheduled appt to come in for o2 qualification on 03/12/15 at 2 pm [name] aware

## 2015-03-12 ENCOUNTER — Ambulatory Visit: Payer: Medicare Other

## 2015-03-12 ENCOUNTER — Telehealth: Payer: Self-pay | Admitting: Pulmonary Disease

## 2015-03-12 NOTE — Telephone Encounter (Signed)
Spoke with the pt and scheduled for qualifying sats 03/18/15 at 2:15pm

## 2015-03-16 DIAGNOSIS — J209 Acute bronchitis, unspecified: Secondary | ICD-10-CM | POA: Diagnosis not present

## 2015-03-16 DIAGNOSIS — E1140 Type 2 diabetes mellitus with diabetic neuropathy, unspecified: Secondary | ICD-10-CM | POA: Diagnosis not present

## 2015-03-16 DIAGNOSIS — I5022 Chronic systolic (congestive) heart failure: Secondary | ICD-10-CM | POA: Diagnosis not present

## 2015-03-17 ENCOUNTER — Telehealth: Payer: Self-pay | Admitting: Pulmonary Disease

## 2015-03-17 NOTE — Telephone Encounter (Signed)
Spoke with pt, she was scheduled for a qualifying walk test tomorrow and cancelled d/t being sick.  Pt was calling to reschedule appt but didn't know how far out to schedule because she didn't know when she would be feeling better.  I offered pt next week and she declined.  States she will call us back when her health improves to reschedule walk.  Nothing further needed at this time.

## 2015-03-18 ENCOUNTER — Ambulatory Visit: Payer: Medicare Other

## 2015-04-15 DIAGNOSIS — M170 Bilateral primary osteoarthritis of knee: Secondary | ICD-10-CM | POA: Diagnosis not present

## 2015-04-16 ENCOUNTER — Telehealth: Payer: Self-pay | Admitting: Pulmonary Disease

## 2015-04-16 NOTE — Telephone Encounter (Signed)
Pt needs a qualifying walk. This has been scheduled for 04/19/15 at 3:45pm. Nothing further was needed.

## 2015-04-19 ENCOUNTER — Ambulatory Visit: Payer: Medicare Other

## 2015-05-04 ENCOUNTER — Encounter (HOSPITAL_BASED_OUTPATIENT_CLINIC_OR_DEPARTMENT_OTHER): Payer: Medicare Other

## 2015-05-21 DIAGNOSIS — E1140 Type 2 diabetes mellitus with diabetic neuropathy, unspecified: Secondary | ICD-10-CM | POA: Diagnosis not present

## 2015-05-21 DIAGNOSIS — J209 Acute bronchitis, unspecified: Secondary | ICD-10-CM | POA: Diagnosis not present

## 2015-05-21 DIAGNOSIS — I5022 Chronic systolic (congestive) heart failure: Secondary | ICD-10-CM | POA: Diagnosis not present

## 2015-05-31 DIAGNOSIS — F329 Major depressive disorder, single episode, unspecified: Secondary | ICD-10-CM | POA: Diagnosis not present

## 2015-05-31 DIAGNOSIS — F331 Major depressive disorder, recurrent, moderate: Secondary | ICD-10-CM | POA: Diagnosis not present

## 2015-07-07 ENCOUNTER — Encounter (HOSPITAL_BASED_OUTPATIENT_CLINIC_OR_DEPARTMENT_OTHER): Payer: Medicare Other

## 2015-07-07 DIAGNOSIS — M170 Bilateral primary osteoarthritis of knee: Secondary | ICD-10-CM | POA: Diagnosis not present

## 2015-10-07 DIAGNOSIS — F331 Major depressive disorder, recurrent, moderate: Secondary | ICD-10-CM | POA: Diagnosis not present

## 2015-10-07 DIAGNOSIS — F329 Major depressive disorder, single episode, unspecified: Secondary | ICD-10-CM | POA: Diagnosis not present

## 2015-10-13 DIAGNOSIS — M170 Bilateral primary osteoarthritis of knee: Secondary | ICD-10-CM | POA: Diagnosis not present

## 2015-10-20 ENCOUNTER — Telehealth: Payer: Self-pay | Admitting: Pulmonary Disease

## 2015-10-20 DIAGNOSIS — I482 Chronic atrial fibrillation: Secondary | ICD-10-CM | POA: Diagnosis not present

## 2015-10-20 DIAGNOSIS — I5022 Chronic systolic (congestive) heart failure: Secondary | ICD-10-CM | POA: Diagnosis not present

## 2015-10-20 DIAGNOSIS — E1140 Type 2 diabetes mellitus with diabetic neuropathy, unspecified: Secondary | ICD-10-CM | POA: Diagnosis not present

## 2015-10-20 NOTE — Telephone Encounter (Signed)
Spoke with the pt She thought her appt on 10/29/15 was just to see nurse to be walked, and this would not work for her according to [facility]  I advised that the appt is with TP and nurse will walk her to get qualifying sats  She verbalized understanding  Nothing further needed

## 2015-10-29 ENCOUNTER — Encounter: Payer: Self-pay | Admitting: Adult Health

## 2015-10-29 ENCOUNTER — Ambulatory Visit (INDEPENDENT_AMBULATORY_CARE_PROVIDER_SITE_OTHER): Payer: Medicare Other | Admitting: Adult Health

## 2015-10-29 VITALS — BP 130/80 | HR 93 | Temp 98.0°F | Ht 65.0 in | Wt 304.0 lb

## 2015-10-29 DIAGNOSIS — Z23 Encounter for immunization: Secondary | ICD-10-CM

## 2015-10-29 DIAGNOSIS — G4733 Obstructive sleep apnea (adult) (pediatric): Secondary | ICD-10-CM | POA: Diagnosis not present

## 2015-10-29 DIAGNOSIS — J9612 Chronic respiratory failure with hypercapnia: Secondary | ICD-10-CM

## 2015-10-29 NOTE — Progress Notes (Signed)
Subjective:    Patient ID: [name], female    DOB: November 17, 1956, 59 y.o.   MRN: [id number]  HPI 59 yo obese female with obstructive sleep apnea and OHS    TESTS: Auto BPAP 11/13/11 to 11/26/11>>Used on 12 of 14 days with average 2 hrs 17 min. Average AHI 28 (8.6 obstructive, 19.6 central) with median setting 13/9 cm H2O, 95th percentile setting 15/11 cm H2O. PSG 03/26/12>>AHI 36.6, SpO2 low 91%. Test done with 2 liters oxygen. PLMI 3.1. PVC's, bigeminy. BiPAP 05/23/12 to 07/08/12>>Used on 47 of 47 nights with average 4 hrs 48 min. Average AHI 11 with BiPAP 13/9 cm H2O. ABG 07/07/13 >> pH 7.34, PaCO2 59.4, PaO2 82 Echo 11/09/14 >> EF 55 to 60%, mod MR   10/29/2015 Follow up : OSA /OHS  Patient returns for six-month follow-up. Patient has known severe sleep apnea . He was initially on BiPAP with noncompliance. She was set up for another sleep study. Last visit. However, had to cancel her appointment. She needs a new sleep study to get restarted on her BiPAP. She has daytime sleepiness. Restless sleep and snoring.  Denies any cough, chest tightness/congestion, fever, nasuea or vomiting. Remains on o2 at 3l/m   Past Medical History  Diagnosis Date  . OSA (obstructive sleep apnea)   . CHF (congestive heart failure) ([place])   . Diabetes mellitus   . Altered mental status     2nd to hypercapnea  . RLS (restless legs syndrome)   . Acute and chronic respiratory failure with hypercapnia ([place])   . Hypoxemia   . Endometrial cancer ([place])   . Morbid obesity ([place])   . Depression   . Hypothyroidism   . GERD (gastroesophageal reflux disease)   . Nephrolithiasis   . Chronic low back pain   . ADD (attention deficit disorder)   . Restless leg syndrome   . Coronary artery disease   . Complication of anesthesia     " I AM SLOW MTO WAKE UP AT ITMES "  . Hypertension   . Hypercapnia     Current Outpatient Prescriptions on File Prior to Visit  Medication Sig Dispense Refill  . b  complex vitamins tablet Take 1 tablet by mouth daily.     Marland Kitchen BIOTIN PO Take 1 tablet by mouth 2 (two) times daily.    . cetirizine (ZYRTEC) 10 MG tablet Take 10 mg by mouth at bedtime.    . clotrimazole-betamethasone (LOTRISONE) cream Apply 1 application topically 2 (two) times daily.    Marland Kitchen diltiazem (DILACOR XR) 240 MG 24 hr capsule Take 1 capsule (240 mg total) by mouth daily. 30 capsule 1  . furosemide (LASIX) 40 MG tablet Take 1 tablet (40 mg total) by mouth 2 (two) times daily. 60 tablet 3  . levothyroxine (SYNTHROID, LEVOTHROID) 75 MCG tablet Take 75 mcg by mouth daily.      Marland Kitchen losartan (COZAAR) 50 MG tablet Take 50 mg by mouth daily.     . methadone (DOLOPHINE) 10 MG/5ML solution Take 70 mg by mouth every morning.     . nystatin (MYCOSTATIN/NYSTOP) 100000 UNIT/GM POWD Apply 1 g topically 2 (two) times daily as needed (for rash).    . potassium chloride SA (K-DUR,KLOR-CON) 20 MEQ tablet Take 20 mEq by mouth 2 (two) times daily.    . prochlorperazine (COMPAZINE) 10 MG tablet Take 10 mg by mouth every 6 (six) hours as needed for nausea or vomiting.    . propranolol (INDERAL) 40 MG tablet  Take 40 mg by mouth 2 (two) times daily.    . sertraline (ZOLOFT) 100 MG tablet Take 200 mg by mouth at bedtime.     . lansoprazole (PREVACID) 30 MG capsule Take 30 mg by mouth at bedtime.     No current facility-administered medications on file prior to visit.      Review of Systems Constitutional:   No  weight loss, night sweats,  Fevers, chills,  +fatigue, or  lassitude.  HEENT:   No headaches,  Difficulty swallowing,  Tooth/dental problems, or  Sore throat,                No sneezing, itching, ear ache, nasal congestion, post nasal drip,   CV:  No chest pain,  Orthopnea, PND, swelling in lower extremities, anasarca, dizziness, palpitations, syncope.   GI  No heartburn, indigestion, abdominal pain, nausea, vomiting, diarrhea, change in bowel habits, loss of appetite, bloody stools.   Resp: No  shortness of breath with exertion or at rest.  No excess mucus, no productive cough,  No non-productive cough,  No coughing up of blood.  No change in color of mucus.  No wheezing.  No chest wall deformity  Skin: no rash or lesions.  GU: no dysuria, change in color of urine, no urgency or frequency.  No flank pain, no hematuria   MS:  No joint pain or swelling.  No decreased range of motion.  No back pain.  Psych:  No change in mood or affect. No depression or anxiety.  No memory loss.         Objective:   Physical Exam  GEN: A/Ox3; pleasant , NAD, obese   VS reviewed   HEENT:  [place]/AT,  EACs-clear, TMs-wnl, NOSE-clear, THROAT-clear, no lesions, no postnasal drip or exudate noted. Class 2-3 MP airway   NECK:  Supple w/ fair ROM; no JVD; normal carotid impulses w/o bruits; no thyromegaly or nodules palpated; no lymphadenopathy.  RESP  Clear  P & A; w/o, wheezes/ rales/ or rhonchi.no accessory muscle use, no dullness to percussion  CARD:  RRR, no m/r/g  , no peripheral edema, pulses intact, no cyanosis or clubbing.  GI:   Soft & nt; nml bowel sounds; no organomegaly or masses detected.  Musco: Warm bil, no deformities or joint swelling noted.   Neuro: alert, no focal deficits noted.    Skin: Warm, no lesions or rashes       Assessment & Plan:

## 2015-10-29 NOTE — Patient Instructions (Addendum)
Continue on Oxygen 3l/m .  Set up split night study .  Work on weight loss.  Flu shot today . Follow up Dr. Halford Chessman in 3 months and As needed

## 2015-10-29 NOTE — Assessment & Plan Note (Signed)
Continue on Oxygen 3l/m .  Set up split night study .  Work on weight loss.  Flu shot today . Follow up Dr. Halford Chessman in 3 months and As needed

## 2015-10-30 NOTE — Progress Notes (Signed)
Reviewed and agree with assessment/plan. 

## 2015-11-11 DIAGNOSIS — M170 Bilateral primary osteoarthritis of knee: Secondary | ICD-10-CM | POA: Diagnosis not present

## 2015-11-11 DIAGNOSIS — M545 Low back pain: Secondary | ICD-10-CM | POA: Diagnosis not present

## 2015-12-22 DIAGNOSIS — D649 Anemia, unspecified: Secondary | ICD-10-CM | POA: Diagnosis not present

## 2015-12-22 DIAGNOSIS — E119 Type 2 diabetes mellitus without complications: Secondary | ICD-10-CM | POA: Diagnosis not present

## 2015-12-22 DIAGNOSIS — E785 Hyperlipidemia, unspecified: Secondary | ICD-10-CM | POA: Diagnosis not present

## 2015-12-22 DIAGNOSIS — E876 Hypokalemia: Secondary | ICD-10-CM | POA: Diagnosis not present

## 2015-12-22 DIAGNOSIS — E559 Vitamin D deficiency, unspecified: Secondary | ICD-10-CM | POA: Diagnosis not present

## 2015-12-22 DIAGNOSIS — Z79899 Other long term (current) drug therapy: Secondary | ICD-10-CM | POA: Diagnosis not present

## 2015-12-23 ENCOUNTER — Other Ambulatory Visit: Payer: Self-pay | Admitting: Cardiovascular Disease

## 2015-12-23 ENCOUNTER — Ambulatory Visit
Admission: RE | Admit: 2015-12-23 | Discharge: 2015-12-23 | Disposition: A | Discharge status: Home / Self Care | Payer: Medicare Other | Source: Ambulatory Visit | Attending: Cardiovascular Disease | Admitting: Cardiovascular Disease

## 2015-12-23 DIAGNOSIS — R05 Cough: Secondary | ICD-10-CM

## 2015-12-23 DIAGNOSIS — E1142 Type 2 diabetes mellitus with diabetic polyneuropathy: Secondary | ICD-10-CM | POA: Diagnosis not present

## 2015-12-23 DIAGNOSIS — R0602 Shortness of breath: Secondary | ICD-10-CM

## 2015-12-23 DIAGNOSIS — I483 Typical atrial flutter: Secondary | ICD-10-CM | POA: Diagnosis not present

## 2015-12-23 DIAGNOSIS — I5023 Acute on chronic systolic (congestive) heart failure: Secondary | ICD-10-CM | POA: Diagnosis not present

## 2015-12-23 DIAGNOSIS — M170 Bilateral primary osteoarthritis of knee: Secondary | ICD-10-CM | POA: Diagnosis not present

## 2015-12-23 DIAGNOSIS — R059 Cough, unspecified: Secondary | ICD-10-CM

## 2015-12-23 DIAGNOSIS — I2510 Atherosclerotic heart disease of native coronary artery without angina pectoris: Secondary | ICD-10-CM | POA: Diagnosis not present

## 2015-12-25 ENCOUNTER — Inpatient Hospital Stay (HOSPITAL_COMMUNITY)
Admission: EM | Admit: 2015-12-25 | Discharge: 2016-01-02 | DRG: 871 | Disposition: A | Discharge status: Home / Self Care | Payer: Medicare Other | Attending: Internal Medicine | Admitting: Internal Medicine

## 2015-12-25 ENCOUNTER — Emergency Department (HOSPITAL_COMMUNITY): Payer: Medicare Other

## 2015-12-25 ENCOUNTER — Encounter (HOSPITAL_COMMUNITY): Payer: Self-pay | Admitting: Emergency Medicine

## 2015-12-25 DIAGNOSIS — I110 Hypertensive heart disease with heart failure: Secondary | ICD-10-CM | POA: Diagnosis present

## 2015-12-25 DIAGNOSIS — Z6841 Body Mass Index (BMI) 40.0 and over, adult: Secondary | ICD-10-CM

## 2015-12-25 DIAGNOSIS — Y95 Nosocomial condition: Secondary | ICD-10-CM | POA: Diagnosis present

## 2015-12-25 DIAGNOSIS — Z881 Allergy status to other antibiotic agents status: Secondary | ICD-10-CM | POA: Diagnosis not present

## 2015-12-25 DIAGNOSIS — Z8542 Personal history of malignant neoplasm of other parts of uterus: Secondary | ICD-10-CM | POA: Diagnosis not present

## 2015-12-25 DIAGNOSIS — R0902 Hypoxemia: Secondary | ICD-10-CM | POA: Diagnosis not present

## 2015-12-25 DIAGNOSIS — E11649 Type 2 diabetes mellitus with hypoglycemia without coma: Secondary | ICD-10-CM | POA: Diagnosis not present

## 2015-12-25 DIAGNOSIS — N179 Acute kidney failure, unspecified: Secondary | ICD-10-CM | POA: Diagnosis present

## 2015-12-25 DIAGNOSIS — Z882 Allergy status to sulfonamides status: Secondary | ICD-10-CM | POA: Diagnosis not present

## 2015-12-25 DIAGNOSIS — I5033 Acute on chronic diastolic (congestive) heart failure: Secondary | ICD-10-CM | POA: Diagnosis not present

## 2015-12-25 DIAGNOSIS — R55 Syncope and collapse: Secondary | ICD-10-CM | POA: Diagnosis not present

## 2015-12-25 DIAGNOSIS — J9602 Acute respiratory failure with hypercapnia: Secondary | ICD-10-CM | POA: Diagnosis not present

## 2015-12-25 DIAGNOSIS — G2581 Restless legs syndrome: Secondary | ICD-10-CM | POA: Diagnosis not present

## 2015-12-25 DIAGNOSIS — K219 Gastro-esophageal reflux disease without esophagitis: Secondary | ICD-10-CM | POA: Diagnosis not present

## 2015-12-25 DIAGNOSIS — E039 Hypothyroidism, unspecified: Secondary | ICD-10-CM | POA: Diagnosis present

## 2015-12-25 DIAGNOSIS — F1120 Opioid dependence, uncomplicated: Secondary | ICD-10-CM | POA: Diagnosis present

## 2015-12-25 DIAGNOSIS — G931 Anoxic brain damage, not elsewhere classified: Secondary | ICD-10-CM | POA: Diagnosis present

## 2015-12-25 DIAGNOSIS — A408 Other streptococcal sepsis: Secondary | ICD-10-CM | POA: Diagnosis not present

## 2015-12-25 DIAGNOSIS — F909 Attention-deficit hyperactivity disorder, unspecified type: Secondary | ICD-10-CM | POA: Diagnosis present

## 2015-12-25 DIAGNOSIS — D649 Anemia, unspecified: Secondary | ICD-10-CM | POA: Diagnosis present

## 2015-12-25 DIAGNOSIS — R0600 Dyspnea, unspecified: Secondary | ICD-10-CM

## 2015-12-25 DIAGNOSIS — Z90710 Acquired absence of both cervix and uterus: Secondary | ICD-10-CM

## 2015-12-25 DIAGNOSIS — G8929 Other chronic pain: Secondary | ICD-10-CM | POA: Diagnosis present

## 2015-12-25 DIAGNOSIS — J189 Pneumonia, unspecified organism: Secondary | ICD-10-CM | POA: Diagnosis not present

## 2015-12-25 DIAGNOSIS — Z4682 Encounter for fitting and adjustment of non-vascular catheter: Secondary | ICD-10-CM | POA: Diagnosis not present

## 2015-12-25 DIAGNOSIS — I2510 Atherosclerotic heart disease of native coronary artery without angina pectoris: Secondary | ICD-10-CM | POA: Diagnosis present

## 2015-12-25 DIAGNOSIS — J449 Chronic obstructive pulmonary disease, unspecified: Secondary | ICD-10-CM | POA: Diagnosis not present

## 2015-12-25 DIAGNOSIS — J9620 Acute and chronic respiratory failure, unspecified whether with hypoxia or hypercapnia: Secondary | ICD-10-CM | POA: Diagnosis present

## 2015-12-25 DIAGNOSIS — J9811 Atelectasis: Secondary | ICD-10-CM | POA: Diagnosis not present

## 2015-12-25 DIAGNOSIS — A419 Sepsis, unspecified organism: Secondary | ICD-10-CM | POA: Diagnosis not present

## 2015-12-25 DIAGNOSIS — Z978 Presence of other specified devices: Secondary | ICD-10-CM

## 2015-12-25 DIAGNOSIS — I481 Persistent atrial fibrillation: Secondary | ICD-10-CM | POA: Diagnosis not present

## 2015-12-25 DIAGNOSIS — J9601 Acute respiratory failure with hypoxia: Secondary | ICD-10-CM | POA: Insufficient documentation

## 2015-12-25 DIAGNOSIS — E873 Alkalosis: Secondary | ICD-10-CM | POA: Diagnosis not present

## 2015-12-25 DIAGNOSIS — E870 Hyperosmolality and hypernatremia: Secondary | ICD-10-CM | POA: Diagnosis not present

## 2015-12-25 DIAGNOSIS — J9621 Acute and chronic respiratory failure with hypoxia: Secondary | ICD-10-CM | POA: Diagnosis not present

## 2015-12-25 DIAGNOSIS — E662 Morbid (severe) obesity with alveolar hypoventilation: Secondary | ICD-10-CM | POA: Diagnosis not present

## 2015-12-25 DIAGNOSIS — R569 Unspecified convulsions: Secondary | ICD-10-CM | POA: Diagnosis not present

## 2015-12-25 DIAGNOSIS — M1990 Unspecified osteoarthritis, unspecified site: Secondary | ICD-10-CM | POA: Diagnosis not present

## 2015-12-25 DIAGNOSIS — Z888 Allergy status to other drugs, medicaments and biological substances status: Secondary | ICD-10-CM

## 2015-12-25 DIAGNOSIS — F329 Major depressive disorder, single episode, unspecified: Secondary | ICD-10-CM | POA: Diagnosis not present

## 2015-12-25 DIAGNOSIS — M545 Low back pain: Secondary | ICD-10-CM | POA: Diagnosis not present

## 2015-12-25 DIAGNOSIS — I4891 Unspecified atrial fibrillation: Secondary | ICD-10-CM | POA: Diagnosis not present

## 2015-12-25 DIAGNOSIS — E872 Acidosis: Secondary | ICD-10-CM | POA: Diagnosis not present

## 2015-12-25 DIAGNOSIS — R000 Tachycardia, unspecified: Secondary | ICD-10-CM | POA: Diagnosis not present

## 2015-12-25 DIAGNOSIS — J690 Pneumonitis due to inhalation of food and vomit: Secondary | ICD-10-CM | POA: Diagnosis present

## 2015-12-25 DIAGNOSIS — J9612 Chronic respiratory failure with hypercapnia: Secondary | ICD-10-CM | POA: Diagnosis present

## 2015-12-25 DIAGNOSIS — G4733 Obstructive sleep apnea (adult) (pediatric): Secondary | ICD-10-CM | POA: Diagnosis not present

## 2015-12-25 DIAGNOSIS — E876 Hypokalemia: Secondary | ICD-10-CM | POA: Diagnosis present

## 2015-12-25 DIAGNOSIS — I5032 Chronic diastolic (congestive) heart failure: Secondary | ICD-10-CM | POA: Diagnosis not present

## 2015-12-25 DIAGNOSIS — Z23 Encounter for immunization: Secondary | ICD-10-CM | POA: Diagnosis not present

## 2015-12-25 DIAGNOSIS — J9622 Acute and chronic respiratory failure with hypercapnia: Secondary | ICD-10-CM | POA: Diagnosis not present

## 2015-12-25 DIAGNOSIS — Z886 Allergy status to analgesic agent status: Secondary | ICD-10-CM

## 2015-12-25 DIAGNOSIS — I480 Paroxysmal atrial fibrillation: Secondary | ICD-10-CM | POA: Diagnosis not present

## 2015-12-25 DIAGNOSIS — I509 Heart failure, unspecified: Secondary | ICD-10-CM | POA: Diagnosis not present

## 2015-12-25 DIAGNOSIS — E669 Obesity, unspecified: Secondary | ICD-10-CM | POA: Diagnosis not present

## 2015-12-25 DIAGNOSIS — R0602 Shortness of breath: Secondary | ICD-10-CM | POA: Diagnosis not present

## 2015-12-25 DIAGNOSIS — Z87891 Personal history of nicotine dependence: Secondary | ICD-10-CM | POA: Diagnosis not present

## 2015-12-25 DIAGNOSIS — E119 Type 2 diabetes mellitus without complications: Secondary | ICD-10-CM | POA: Diagnosis not present

## 2015-12-25 HISTORY — DX: Paroxysmal atrial fibrillation: I48.0

## 2015-12-25 LAB — CBC WITH DIFFERENTIAL/PLATELET
BASOS ABS: 0.1 10*3/uL (ref 0.0–0.1)
BASOS PCT: 0 %
EOS ABS: 0 10*3/uL (ref 0.0–0.7)
EOS PCT: 0 %
HCT: 43.2 % (ref 36.0–46.0)
Hemoglobin: 12.2 g/dL (ref 12.0–15.0)
Lymphocytes Relative: 14 %
Lymphs Abs: 2 10*3/uL (ref 0.7–4.0)
MCH: 25.3 pg — ABNORMAL LOW (ref 26.0–34.0)
MCHC: 28.2 g/dL — ABNORMAL LOW (ref 30.0–36.0)
MCV: 89.4 fL (ref 78.0–100.0)
MONO ABS: 1.2 10*3/uL — AB (ref 0.1–1.0)
Monocytes Relative: 9 %
NEUTROS ABS: 11.1 10*3/uL — AB (ref 1.7–7.7)
Neutrophils Relative %: 77 %
PLATELETS: 304 10*3/uL (ref 150–400)
RBC: 4.83 MIL/uL (ref 3.87–5.11)
RDW: 17.4 % — AB (ref 11.5–15.5)
WBC: 14.4 10*3/uL — ABNORMAL HIGH (ref 4.0–10.5)

## 2015-12-25 LAB — BASIC METABOLIC PANEL
ANION GAP: 11 (ref 5–15)
BUN: 16 mg/dL (ref 6–20)
CHLORIDE: 103 mmol/L (ref 101–111)
CO2: 28 mmol/L (ref 22–32)
Calcium: 8.2 mg/dL — ABNORMAL LOW (ref 8.9–10.3)
Creatinine, Ser: 1.14 mg/dL — ABNORMAL HIGH (ref 0.44–1.00)
GFR calc non Af Amer: 51 mL/min — ABNORMAL LOW (ref >60)
GFR, EST AFRICAN AMERICAN: 59 mL/min — AB (ref >60)
Glucose, Bld: 100 mg/dL — ABNORMAL HIGH (ref 65–99)
POTASSIUM: 4 mmol/L (ref 3.5–5.1)
SODIUM: 142 mmol/L (ref 135–145)

## 2015-12-25 LAB — I-STAT CG4 LACTIC ACID, ED
LACTIC ACID, VENOUS: 1.62 mmol/L (ref 0.5–2.0)
Lactic Acid, Venous: 2.15 mmol/L (ref 0.5–2.0)

## 2015-12-25 LAB — COMPREHENSIVE METABOLIC PANEL
ALBUMIN: 3.1 g/dL — AB (ref 3.5–5.0)
ALK PHOS: 111 U/L (ref 38–126)
ALT: 15 U/L (ref 14–54)
ANION GAP: 8 (ref 5–15)
AST: 31 U/L (ref 15–41)
BILIRUBIN TOTAL: 0.9 mg/dL (ref 0.3–1.2)
BUN: 18 mg/dL (ref 6–20)
CALCIUM: 8.3 mg/dL — AB (ref 8.9–10.3)
CO2: 28 mmol/L (ref 22–32)
Chloride: 104 mmol/L (ref 101–111)
Creatinine, Ser: 1.33 mg/dL — ABNORMAL HIGH (ref 0.44–1.00)
GFR calc Af Amer: 49 mL/min — ABNORMAL LOW (ref >60)
GFR, EST NON AFRICAN AMERICAN: 42 mL/min — AB (ref >60)
GLUCOSE: 157 mg/dL — AB (ref 65–99)
Potassium: 5.7 mmol/L — ABNORMAL HIGH (ref 3.5–5.1)
Sodium: 140 mmol/L (ref 135–145)
TOTAL PROTEIN: 8.2 g/dL — AB (ref 6.5–8.1)

## 2015-12-25 LAB — I-STAT ARTERIAL BLOOD GAS, ED
ACID-BASE EXCESS: 1 mmol/L (ref 0.0–2.0)
Bicarbonate: 33.4 mEq/L — ABNORMAL HIGH (ref 20.0–24.0)
O2 SAT: 97 %
PCO2 ART: 99.8 mmHg — AB (ref 35.0–45.0)
PH ART: 7.133 — AB (ref 7.350–7.450)
Patient temperature: 98.6
TCO2: 36 mmol/L (ref 0–100)
pO2, Arterial: 132 mmHg — ABNORMAL HIGH (ref 80.0–100.0)

## 2015-12-25 LAB — POCT I-STAT 3, ART BLOOD GAS (G3+)
ACID-BASE EXCESS: 3 mmol/L — AB (ref 0.0–2.0)
Bicarbonate: 29 mEq/L — ABNORMAL HIGH (ref 20.0–24.0)
O2 Saturation: 87 %
PCO2 ART: 49 mmHg — AB (ref 35.0–45.0)
PH ART: 7.38 (ref 7.350–7.450)
PO2 ART: 55 mmHg — AB (ref 80.0–100.0)
Patient temperature: 98.6
TCO2: 30 mmol/L (ref 0–100)

## 2015-12-25 LAB — GLUCOSE, CAPILLARY
Glucose-Capillary: 62 mg/dL — ABNORMAL LOW (ref 65–99)
Glucose-Capillary: 74 mg/dL (ref 65–99)

## 2015-12-25 LAB — URINALYSIS, ROUTINE W REFLEX MICROSCOPIC
Bilirubin Urine: NEGATIVE
GLUCOSE, UA: NEGATIVE mg/dL
Hgb urine dipstick: NEGATIVE
KETONES UR: NEGATIVE mg/dL
LEUKOCYTES UA: NEGATIVE
NITRITE: NEGATIVE
PROTEIN: 30 mg/dL — AB
Specific Gravity, Urine: 1.017 (ref 1.005–1.030)
pH: 5 (ref 5.0–8.0)

## 2015-12-25 LAB — URINE MICROSCOPIC-ADD ON
Bacteria, UA: NONE SEEN
RBC / HPF: NONE SEEN RBC/hpf (ref 0–5)

## 2015-12-25 LAB — CBC
HEMATOCRIT: 39.1 % (ref 36.0–46.0)
HEMOGLOBIN: 11 g/dL — AB (ref 12.0–15.0)
MCH: 24.7 pg — ABNORMAL LOW (ref 26.0–34.0)
MCHC: 28.1 g/dL — ABNORMAL LOW (ref 30.0–36.0)
MCV: 87.7 fL (ref 78.0–100.0)
Platelets: 183 10*3/uL (ref 150–400)
RBC: 4.46 MIL/uL (ref 3.87–5.11)
RDW: 16.4 % — ABNORMAL HIGH (ref 11.5–15.5)
WBC: 9.5 10*3/uL (ref 4.0–10.5)

## 2015-12-25 LAB — I-STAT TROPONIN, ED: TROPONIN I, POC: 0 ng/mL (ref 0.00–0.08)

## 2015-12-25 LAB — BRAIN NATRIURETIC PEPTIDE: B NATRIURETIC PEPTIDE 5: 369.8 pg/mL — AB (ref 0.0–100.0)

## 2015-12-25 LAB — PROCALCITONIN

## 2015-12-25 LAB — CBG MONITORING, ED: Glucose-Capillary: 187 mg/dL — ABNORMAL HIGH (ref 65–99)

## 2015-12-25 MED ORDER — VANCOMYCIN HCL IN DEXTROSE 1-5 GM/200ML-% IV SOLN
1000.0000 mg | Freq: Once | INTRAVENOUS | Status: AC
  Administered 2015-12-25: 1000 mg via INTRAVENOUS
  Filled 2015-12-25: qty 200

## 2015-12-25 MED ORDER — SODIUM CHLORIDE 0.9 % IV SOLN
INTRAVENOUS | Status: DC
  Administered 2015-12-25: 22:00:00 via INTRAVENOUS

## 2015-12-25 MED ORDER — SODIUM CHLORIDE 0.9 % IV BOLUS (SEPSIS)
1000.0000 mL | INTRAVENOUS | Status: DC
  Administered 2015-12-25 (×2): 1000 mL via INTRAVENOUS

## 2015-12-25 MED ORDER — LORAZEPAM 2 MG/ML IJ SOLN
2.0000 mg | Freq: Once | INTRAMUSCULAR | Status: AC
  Administered 2015-12-25: 2 mg via INTRAVENOUS

## 2015-12-25 MED ORDER — SODIUM CHLORIDE 0.9 % IV SOLN
37.5000 ug/h | INTRAVENOUS | Status: DC
  Filled 2015-12-25: qty 10

## 2015-12-25 MED ORDER — INSULIN ASPART 100 UNIT/ML ~~LOC~~ SOLN
2.0000 [IU] | SUBCUTANEOUS | Status: DC
  Administered 2015-12-26 (×2): 2 [IU] via SUBCUTANEOUS
  Administered 2015-12-26 – 2015-12-27 (×2): 4 [IU] via SUBCUTANEOUS

## 2015-12-25 MED ORDER — ETOMIDATE 2 MG/ML IV SOLN
INTRAVENOUS | Status: AC | PRN
  Administered 2015-12-25: 30 mg via INTRAVENOUS

## 2015-12-25 MED ORDER — SODIUM CHLORIDE 0.9 % IV BOLUS (SEPSIS): 500.0000 mL | INTRAVENOUS | Status: DC

## 2015-12-25 MED ORDER — HEPARIN SODIUM (PORCINE) 5000 UNIT/ML IJ SOLN
5000.0000 [IU] | Freq: Three times a day (TID) | INTRAMUSCULAR | Status: DC
  Administered 2015-12-25 – 2015-12-28 (×8): 5000 [IU] via SUBCUTANEOUS
  Filled 2015-12-25 (×12): qty 1

## 2015-12-25 MED ORDER — DEXTROSE 5 % IV SOLN
2.0000 g | INTRAVENOUS | Status: DC
  Filled 2015-12-25: qty 2

## 2015-12-25 MED ORDER — FENTANYL CITRATE (PF) 100 MCG/2ML IJ SOLN
50.0000 ug | Freq: Once | INTRAMUSCULAR | Status: AC
  Administered 2015-12-25: 50 ug via INTRAVENOUS
  Filled 2015-12-25: qty 2

## 2015-12-25 MED ORDER — METOPROLOL TARTRATE 12.5 MG HALF TABLET
12.5000 mg | ORAL_TABLET | Freq: Two times a day (BID) | ORAL | Status: DC
  Administered 2015-12-25 – 2015-12-27 (×5): 12.5 mg via ORAL
  Filled 2015-12-25 (×7): qty 1

## 2015-12-25 MED ORDER — SODIUM CHLORIDE 0.9 % IV SOLN
500.0000 mg | Freq: Two times a day (BID) | INTRAVENOUS | Status: AC
  Administered 2015-12-25 – 2015-12-26 (×2): 500 mg via INTRAVENOUS
  Filled 2015-12-25 (×2): qty 5

## 2015-12-25 MED ORDER — SODIUM CHLORIDE 0.9 % IV SOLN
25.0000 ug/h | INTRAVENOUS | Status: DC
  Administered 2015-12-27: 200 ug/h via INTRAVENOUS
  Administered 2015-12-27: 50 ug/h via INTRAVENOUS
  Administered 2015-12-28: 300 ug/h via INTRAVENOUS
  Filled 2015-12-25 (×4): qty 50

## 2015-12-25 MED ORDER — MIDAZOLAM HCL 2 MG/2ML IJ SOLN
INTRAMUSCULAR | Status: AC
  Administered 2015-12-25: 4 mg
  Filled 2015-12-25: qty 4

## 2015-12-25 MED ORDER — LEVOTHYROXINE SODIUM 100 MCG IV SOLR
50.0000 ug | Freq: Every day | INTRAVENOUS | Status: DC
  Administered 2015-12-25 – 2015-12-28 (×4): 50 ug via INTRAVENOUS
  Filled 2015-12-25 (×4): qty 5

## 2015-12-25 MED ORDER — VANCOMYCIN HCL 10 G IV SOLR
1500.0000 mg | INTRAVENOUS | Status: DC
  Filled 2015-12-25: qty 1500

## 2015-12-25 MED ORDER — FENTANYL BOLUS VIA INFUSION
50.0000 ug | INTRAVENOUS | Status: DC | PRN
  Administered 2015-12-27: 50 ug via INTRAVENOUS
  Filled 2015-12-25: qty 50

## 2015-12-25 MED ORDER — CHLORHEXIDINE GLUCONATE 0.12% ORAL RINSE (MEDLINE KIT)
15.0000 mL | Freq: Two times a day (BID) | OROMUCOSAL | Status: DC
  Administered 2015-12-25 – 2015-12-31 (×10): 15 mL via OROMUCOSAL

## 2015-12-25 MED ORDER — SODIUM CHLORIDE 0.9 % IV BOLUS (SEPSIS): 1000.0000 mL | Freq: Once | INTRAVENOUS | Status: DC

## 2015-12-25 MED ORDER — FENTANYL CITRATE (PF) 100 MCG/2ML IJ SOLN: 50.0000 ug | Freq: Once | INTRAMUSCULAR | Status: DC

## 2015-12-25 MED ORDER — ALBUTEROL SULFATE (2.5 MG/3ML) 0.083% IN NEBU: 2.5000 mg | INHALATION_SOLUTION | RESPIRATORY_TRACT | Status: DC | PRN

## 2015-12-25 MED ORDER — PANTOPRAZOLE SODIUM 40 MG IV SOLR
40.0000 mg | Freq: Every day | INTRAVENOUS | Status: DC
  Administered 2015-12-25 – 2015-12-28 (×4): 40 mg via INTRAVENOUS
  Filled 2015-12-25 (×5): qty 40

## 2015-12-25 MED ORDER — SODIUM CHLORIDE 0.9 % IV SOLN
1.5000 g | Freq: Four times a day (QID) | INTRAVENOUS | Status: DC
  Administered 2015-12-25 – 2015-12-29 (×14): 1.5 g via INTRAVENOUS
  Filled 2015-12-25 (×18): qty 1.5

## 2015-12-25 MED ORDER — MIDAZOLAM HCL 2 MG/2ML IJ SOLN
2.0000 mg | INTRAMUSCULAR | Status: AC | PRN
  Administered 2015-12-25 – 2015-12-26 (×3): 2 mg via INTRAVENOUS
  Filled 2015-12-25 (×3): qty 2

## 2015-12-25 MED ORDER — SODIUM CHLORIDE 0.9 % IV SOLN
2.0000 mg/h | INTRAVENOUS | Status: DC
  Administered 2015-12-25: 2 mg/h via INTRAVENOUS
  Filled 2015-12-25 (×2): qty 10

## 2015-12-25 MED ORDER — DEXTROSE 50 % IV SOLN
INTRAVENOUS | Status: AC
  Administered 2015-12-25: 25 mL
  Filled 2015-12-25: qty 50

## 2015-12-25 MED ORDER — LORAZEPAM 2 MG/ML IJ SOLN
INTRAMUSCULAR | Status: AC
  Filled 2015-12-25: qty 1

## 2015-12-25 MED ORDER — ANTISEPTIC ORAL RINSE SOLUTION (CORINZ)
7.0000 mL | Freq: Four times a day (QID) | OROMUCOSAL | Status: DC
  Administered 2015-12-25 – 2015-12-31 (×18): 7 mL via OROMUCOSAL

## 2015-12-25 MED ORDER — MIDAZOLAM HCL 2 MG/2ML IJ SOLN
2.0000 mg | INTRAMUSCULAR | Status: DC | PRN
  Administered 2015-12-26 – 2015-12-27 (×10): 2 mg via INTRAVENOUS
  Filled 2015-12-25 (×10): qty 2

## 2015-12-25 MED ORDER — VANCOMYCIN HCL 10 G IV SOLR
1500.0000 mg | INTRAVENOUS | Status: AC
  Administered 2015-12-25: 1500 mg via INTRAVENOUS
  Filled 2015-12-25: qty 1500

## 2015-12-25 MED ORDER — MIDAZOLAM HCL 2 MG/2ML IJ SOLN
4.0000 mg | Freq: Once | INTRAMUSCULAR | Status: AC
  Administered 2015-12-25: 4 mg via INTRAVENOUS

## 2015-12-25 MED ORDER — DEXTROSE 5 % IV SOLN
2.0000 g | Freq: Once | INTRAVENOUS | Status: AC
  Administered 2015-12-25: 2 g via INTRAVENOUS
  Filled 2015-12-25: qty 2

## 2015-12-25 MED ORDER — SUCCINYLCHOLINE CHLORIDE 20 MG/ML IJ SOLN
INTRAMUSCULAR | Status: AC | PRN
  Administered 2015-12-25: 150 mg via INTRAVENOUS

## 2015-12-25 NOTE — Progress Notes (Signed)
ANTIBIOTIC CONSULT NOTE - INITIAL  Pharmacy Consult for Cefepime and Vancomycin Indication: rule out sepsis  Allergies  Allergen Reactions  . Aspirin Other (See Comments)    Chest pain  . Eliquis [Apixaban]     Excessive bleeding  . Nsaids Other (See Comments)    Chest pain  . Other Other (See Comments)    "BP med" combined with plavix, caused syncope  . Plavix [Clopidogrel] Other (See Comments)    syncope  . Sudafed [Pseudoephedrine] Palpitations    Raised BP  . Erythromycin Nausea And Vomiting  . Sulfa Antibiotics Other (See Comments)    constipation  . Sulfamethoxazole-Trimethoprim Diarrhea  . Altace [Ramipril] Other (See Comments)    Severe fatigue  . Zofran [Ondansetron Hcl] Other (See Comments)    "I put me into a coma"    Patient Measurements:   Adjusted Body Weight:   Vital Signs: BP: 110/63 mmHg (01/14 1128) Pulse Rate: 90 (01/14 1127) Intake/Output from previous day:   Intake/Output from this shift: Total I/O In: -  Out: 60 [Urine:60]  Labs:  Recent Labs  12/25/15 1130  WBC 14.4*  HGB 12.2  PLT 304   CrCl cannot be calculated (Unknown ideal weight.). No results for input(s): VANCOTROUGH, VANCOPEAK, VANCORANDOM, GENTTROUGH, GENTPEAK, GENTRANDOM, TOBRATROUGH, TOBRAPEAK, TOBRARND, AMIKACINPEAK, AMIKACINTROU, AMIKACIN in the last 72 hours.   Microbiology: No results found for this or any previous visit (from the past 720 hour(s)).  Medical History: Past Medical History  Diagnosis Date  . OSA (obstructive sleep apnea)   . CHF (congestive heart failure) ([place])   . Diabetes mellitus   . Altered mental status     2nd to hypercapnea  . RLS (restless legs syndrome)   . Acute and chronic respiratory failure with hypercapnia ([place])   . Hypoxemia   . Endometrial cancer ([place])   . Morbid obesity ([place])   . Depression   . Hypothyroidism   . GERD (gastroesophageal reflux disease)   . Nephrolithiasis   . Chronic low back pain   . ADD (attention deficit  disorder)   . Restless leg syndrome   . Coronary artery disease   . Complication of anesthesia     " I AM SLOW MTO WAKE UP AT ITMES "  . Hypertension   . Hypercapnia     Medications:   (Not in a hospital admission) Scheduled:  . LORazepam      . midazolam  4 mg Intravenous Once   Infusions:  . ceFEPime (MAXIPIME) IV    . midazolam (VERSED) infusion    . sodium chloride Stopped (12/25/15 1255)  . vancomycin     Assessment: 60yo female with history of DM2, HTN, CAD, CHF and hypothyroid presents with AMS. Pharmacy is consulted to dose cefepime and vancomycin for suspected sepsis. WBC 14.4, LA 1.62, sCr 1.33.  Pt received cefepime 2g and vancomycin 1g IV once in the ED.  Goal of Therapy:  Vancomycin trough level 15-20 mcg/ml  Plan:  Cefepime 2g IV q24h Vancomycin 1500mg  IV q24h Measure antibiotic drug levels at steady state Follow up culture results, renal function and clinical course  Andrey Cota. Diona Foley, PharmD, [place] Clinical Pharmacist Pager [phone] 12/25/2015,12:55 PM

## 2015-12-25 NOTE — ED Notes (Signed)
Pt found unresponsive by daughter sats in the 52"s by EMS , pt also had seizure like activity with EMS and while in the ED

## 2015-12-25 NOTE — Progress Notes (Signed)
RR changed from 14 to 22 on vent due to critical ABG values.  RT will continue to monitor

## 2015-12-25 NOTE — Progress Notes (Signed)
eLink Physician-Brief Progress Note Patient Name: EVEE ABUNDIZ DOB: [birth year] MRN: [id number]   Date of Service  12/25/2015  HPI/Events of Note  Lactic Acid = 1.62 >> 2.15.  eICU Interventions  Will bolus with 0.9 NaCl 1 liter IV over 1 hour now.      Intervention Category Major Interventions: Acid-Base disturbance - evaluation and management;Sepsis - evaluation and management  Sommer,Steven Cornelia Copa 12/25/2015, 6:59 PM

## 2015-12-25 NOTE — Progress Notes (Signed)
Hypoglycemic Event  CBG: 62  Treatment: D50 IV 25 mL  Symptoms: None  Follow-up CBG: Time:2132 CBG Result:74  Possible Reasons for Event: Unknown     Christie Beckers

## 2015-12-25 NOTE — Progress Notes (Signed)
eLink Physician-Brief Progress Note Patient Name: [name] DOB: 1956/03/13 MRN: [id number]   Date of Service  12/25/2015  HPI/Events of Note  HTN - BP = 168/106. Was on B-Blocker at home.   eICU Interventions  Will start Metoprolol 12.5 mg PO Q 12 hours. Hold dose for HR < 60 or SBP < 105.     Intervention Category Major Interventions: Hypertension - evaluation and management  Sommer,Steven Eugene 12/25/2015, 4:44 PM

## 2015-12-25 NOTE — ED Provider Notes (Addendum)
CSN: [id number]     Arrival date & time 12/25/15  1109 History   First MD Initiated Contact with Patient 12/25/15 1122     Chief Complaint  Patient presents with  . Altered Mental Status     (Consider location/radiation/quality/duration/timing/severity/associated sxs/prior Treatment) Patient is a 60 y.o. female presenting with altered mental status. The history is provided by the EMS personnel. The history is limited by the condition of the patient and the absence of a caregiver.  Altered Mental Status Presenting symptoms: unresponsiveness   Severity:  Severe Most recent episode:  Today Episode history:  Continuous Timing:  Constant Progression:  Unchanged Chronicity:  New Context comment:  Daughter reported that patient was diagnosed with bronchitis a few days ago but this morning patient has been unresponsive. When EMS arrived they noted the patient's oxygen saturation to be 50% on room air with minimal responsiveness.   Past Medical History  Diagnosis Date  . OSA (obstructive sleep apnea)   . CHF (congestive heart failure) ([place])   . Diabetes mellitus   . Altered mental status     2nd to hypercapnea  . RLS (restless legs syndrome)   . Acute and chronic respiratory failure with hypercapnia ([place])   . Hypoxemia   . Endometrial cancer ([place])   . Morbid obesity ([place])   . Depression   . Hypothyroidism   . GERD (gastroesophageal reflux disease)   . Nephrolithiasis   . Chronic low back pain   . ADD (attention deficit disorder)   . Restless leg syndrome   . Coronary artery disease   . Complication of anesthesia     " I AM SLOW MTO WAKE UP AT ITMES "  . Hypertension   . Hypercapnia    Past Surgical History  Procedure Laterality Date  . Back surgery      x2  . Shoulder surgery      x2  . Vaginal hysterectomy      x2  . Dental surgery    . Tonsillectomy    . Sp perc nephrostomy     Family History  Problem Relation Age of Onset  . Cancer Father    Social History   Substance Use Topics  . Smoking status: Former Smoker -- 0.50 packs/day for 2 years    Types: Cigarettes    Quit date: 09/10/1974  . Smokeless tobacco: Never Used     Comment: quit in teenage years  . Alcohol Use: No   OB History    No data available     Review of Systems  Unable to perform ROS: Mental status change      Allergies  Aspirin; Eliquis; Nsaids; Other; Plavix; Sudafed; Erythromycin; Sulfa antibiotics; Sulfamethoxazole-trimethoprim; Altace; and Zofran  Home Medications   Prior to Admission medications   Medication Sig Start Date End Date Taking? Authorizing Provider  b complex vitamins tablet Take 1 tablet by mouth daily.     Historical Provider, MD  BIOTIN PO Take 1 tablet by mouth 2 (two) times daily.    Historical Provider, MD  cetirizine (ZYRTEC) 10 MG tablet Take 10 mg by mouth at bedtime.    Historical Provider, MD  clotrimazole-betamethasone (LOTRISONE) cream Apply 1 application topically 2 (two) times daily.    Historical Provider, MD  diltiazem (DILACOR XR) 240 MG 24 hr capsule Take 1 capsule (240 mg total) by mouth daily. 11/11/14   Dixie Dials, MD  furosemide (LASIX) 40 MG tablet Take 1 tablet (40 mg total) by mouth 2 (two)  times daily. 04/13/14   Dixie Dials, MD  lansoprazole (PREVACID) 30 MG capsule Take 30 mg by mouth at bedtime.    Historical Provider, MD  levothyroxine (SYNTHROID, LEVOTHROID) 75 MCG tablet Take 75 mcg by mouth daily.      Historical Provider, MD  losartan (COZAAR) 50 MG tablet Take 50 mg by mouth daily.     Historical Provider, MD  methadone (DOLOPHINE) 10 MG/5ML solution Take 70 mg by mouth every morning.     Historical Provider, MD  nystatin (MYCOSTATIN/NYSTOP) 100000 UNIT/GM POWD Apply 1 g topically 2 (two) times daily as needed (for rash).    Historical Provider, MD  pantoprazole (PROTONIX) 40 MG tablet Take 40 mg by mouth daily. 10/06/15   Historical Provider, MD  potassium chloride SA (K-DUR,KLOR-CON) 20 MEQ tablet Take 20 mEq by  mouth 2 (two) times daily.    Historical Provider, MD  prochlorperazine (COMPAZINE) 10 MG tablet Take 10 mg by mouth every 6 (six) hours as needed for nausea or vomiting.    Historical Provider, MD  propranolol (INDERAL) 40 MG tablet Take 40 mg by mouth 2 (two) times daily.    Historical Provider, MD  sertraline (ZOLOFT) 100 MG tablet Take 200 mg by mouth at bedtime.     Historical Provider, MD   BP 110/63 mmHg  Pulse 90  Resp 10  SpO2 100% Physical Exam  Constitutional: She appears well-developed and well-nourished. She appears ill. She appears distressed.  Asian is currently unresponsive and has generalized shaking  HENT:  Head: Normocephalic and atraumatic.  Nose: Mucosal edema and rhinorrhea present.  Mouth/Throat: Oropharynx is clear and moist. Mucous membranes are dry.  Eyes: Conjunctivae and EOM are normal. Pupils are equal, round, and reactive to light.  Neck: Normal range of motion. Neck supple.  Cardiovascular: Intact distal pulses.  An irregularly irregular rhythm present. Tachycardia present.   No murmur heard. Pulmonary/Chest: Effort normal. No respiratory distress. She has wheezes. She has rales.  Wet cough on exam  Abdominal: Soft. She exhibits no distension. There is no tenderness. There is no rebound and no guarding.  Musculoskeletal: Normal range of motion. She exhibits edema. She exhibits no tenderness.  2+ pitting edema up to the midshin  Neurological: She is unresponsive.  Skin: Skin is warm and dry. No rash noted. No erythema.  Psychiatric: She has a normal mood and affect. Her behavior is normal.  Nursing note and vitals reviewed.   ED Course  Procedures (including critical care time) Labs Review Labs Reviewed  CBC WITH DIFFERENTIAL/PLATELET - Abnormal; Notable for the following:    WBC 14.4 (*)    MCH 25.3 (*)    MCHC 28.2 (*)    RDW 17.4 (*)    Neutro Abs 11.1 (*)    Monocytes Absolute 1.2 (*)    All other components within normal limits   URINALYSIS, ROUTINE W REFLEX MICROSCOPIC (NOT AT [facility]) - Abnormal; Notable for the following:    APPearance CLOUDY (*)    Protein, ur 30 (*)    All other components within normal limits  BRAIN NATRIURETIC PEPTIDE - Abnormal; Notable for the following:    B Natriuretic Peptide 369.8 (*)    All other components within normal limits  URINE MICROSCOPIC-ADD ON - Abnormal; Notable for the following:    Squamous Epithelial / LPF 0-5 (*)    All other components within normal limits  I-STAT ARTERIAL BLOOD GAS, ED - Abnormal; Notable for the following:    pH, Arterial 7.133 (*)  pCO2 arterial 99.8 (*)    pO2, Arterial 132.0 (*)    Bicarbonate 33.4 (*)    All other components within normal limits  CBG MONITORING, ED - Abnormal; Notable for the following:    Glucose-Capillary 187 (*)    All other components within normal limits  CULTURE, BLOOD (ROUTINE X 2)  CULTURE, BLOOD (ROUTINE X 2)  URINE CULTURE  COMPREHENSIVE METABOLIC PANEL  I-STAT CG4 LACTIC ACID, ED  I-STAT TROPOININ, ED    Imaging Review Ct Head Wo Contrast  12/25/2015  CLINICAL DATA:  Short of breath, cough, unresponsive. EXAM: CT HEAD WITHOUT CONTRAST TECHNIQUE: Contiguous axial images were obtained from the base of the skull through the vertex without intravenous contrast. COMPARISON:  None. FINDINGS: Patient intubated. No acute intracranial hemorrhage. No focal mass lesion. No CT evidence of acute infarction. No midline shift or mass effect. No hydrocephalus. Basilar cisterns are patent. Paranasal sinuses and  mastoid air cells are clear. IMPRESSION: 1. No acute intracranial findings. 2. Intubated patient. Electronically Signed   By: Suzy Bouchard M.D.   On: 12/25/2015 12:34   Dg Chest Port 1 View  12/25/2015  CLINICAL DATA:  Evaluate endotracheal tube placement. Seizure activity. EXAM: PORTABLE CHEST 1 VIEW COMPARISON:  12/23/2015 FINDINGS: Endotracheal tube positioned 3.4 cm from carina. NG tube extends the stomach. Normal  cardiac silhouette. There is of the increased central venous congestion. Air bronchograms in the LEFT lower lobe. No pneumothorax IMPRESSION: 1. Endotracheal tube and NG tube appear in good position. 2. Increased perihilar airspace disease representing edema versus aspiration. Electronically Signed   By: Suzy Bouchard M.D.   On: 12/25/2015 13:14   I have personally reviewed and evaluated these images and lab results as part of my medical decision-making.   EKG Interpretation   Date/Time:  Saturday December 25 2015 11:18:08 EST Ventricular Rate:  101 PR Interval:    QRS Duration: 92 QT Interval:  385 QTC Calculation: 499 R Axis:   85 Text Interpretation:  recurrent Atrial fibrillation Borderline right axis  deviation Low voltage, extremity and precordial leads Borderline  repolarization abnormality Borderline prolonged QT interval Confirmed by  Maryan Rued  MD, Loree Fee ([id number]) on 12/25/2015 12:18:34 PM      MDM   Final diagnoses:  Acute respiratory failure with hypoxia and hypercapnia (HCC)  Sepsis, due to unspecified organism ([place])  HCAP (healthcare-associated pneumonia)   patient with a history of multiple medical problems including diabetes, COPD, hypercapnia, morbid obesity, chronic pain syndrome on methadone who was recently diagnosed by PCP with bronchitis several days ago and EMS was called today by daughter for patient being unresponsive. Upon arrival of EMS patient's oxygen saturations were in the 50s.(2 sats improved with nonrebreather mask to the high 80s however upon arrival here patient was displaying frank seizure activity with generalized jerking, unresponsive and sats in the 70s. After 2 mg of Ativan seizing stopped and oxygen saturations improved however patient did not wake up. She remained a GCS of 3 without a gag. At that point she was intubated for airway protection. No further seizure activity present. Pupils are reactive bilaterally. Patient had coarse breath sounds in  signs of fluid overload in her distal extremities as well as some mild hypotension.  Concern for sepsis and pulmonary infection due to recent history of bronchitis versus severe hypoxia causing seizures, hypercarbia versus severe CHF. Also could be medication related as it is unclear if patient has not been taking some medications which could cause seizure if abruptly stopped. Patient  has no seizure history.  Sepsis order set initiated. CBC, CMP, UA, lactic acid, BNP, troponin, ABG, chest x-ray and head CT pending. Patient's lactate is within normal limits. Patient's vital signs are currently stable. Critical care informed.  1:11 PM Patient started on broad-spectrum antibiotics with vancomycin and cefepime.   Patient's blood gas indicates respiratory acidosis with a CO2 of 99. Her baseline CO2 is in the 50s. PO2 was 132 so we will decrease her FiO2 to 50% in increased her respiratory rate. Chest x-ray shows concern for diffuse pneumonia with tube in satisfactory position. Head CT without acute findings. Speaking with the daughter seizures were most likely a result of hypoxia she has never had seizure disorder and has not stopped any of her medications. She did start an antibiotic 2 days ago but the daughter is unsure what it was.  CRITICAL CARE Performed by: Blanchie Dessert Total critical care time: 45 minutes Critical care time was exclusive of separately billable procedures and treating other patients. Critical care was necessary to treat or prevent imminent or life-threatening deterioration. Critical care was time spent personally by me on the following activities: development of treatment plan with patient and/or surrogate as well as nursing, discussions with consultants, evaluation of patient's response to treatment, examination of patient, obtaining history from patient or surrogate, ordering and performing treatments and interventions, ordering and review of laboratory studies, ordering and  review of radiographic studies, pulse oximetry and re-evaluation of patient's condition.  INTUBATION Performed by: Blanchie Dessert  Required items: required blood products, implants, devices, and special equipment available Patient identity confirmed: provided demographic data and hospital-assigned identification number Time out: Immediately prior to procedure a "time out" was called to verify the correct patient, procedure, equipment, support staff and site/side marked as required.  Indications: ams,hypoxia  Intubation method: 4Glidescope Laryngoscopy   Preoxygenation: BVM  Sedatives: 30Etomidate Paralytic: 150Succinylcholine  Tube Size: 7.5 cuffed  Post-procedure assessment: chest rise and ETCO2 monitor Breath sounds: equal and absent over the epigastrium Tube secured with: ETT holder Chest x-ray interpreted by radiologist and me.  Chest x-ray findings: endotracheal tube in appropriate position  Patient tolerated the procedure well with no immediate complications.     Blanchie Dessert, MD 12/25/15 1315  Blanchie Dessert, MD 12/25/15 1318

## 2015-12-25 NOTE — H&P (Signed)
PULMONARY / CRITICAL CARE MEDICINE   Name: [name] MRN: [id number] DOB: 25-Aug-1956    ADMISSION DATE:  12/25/2015 CONSULTATION DATE:  12/25/15   REFERRING MD:  [facility] ED  CHIEF COMPLAINT:  Obtundation, acute on chronic respiratory failure  HISTORY OF PRESENT ILLNESS:   60 yo morbidly obese woman, Hx OSA / OHS / RLS (followed VS), DM, HTN, chronic resp failure. Has been treated recently as outpt for possible bronchitis. She was found by her daughter unresponsive am 1/14. On EMS arrival SpO2 ~ 50's%, improved with O2. Experienced increasing rhythmic movements consistent w seizure activity, slowed with ativan in ED. She was obtunded and required intubation for MV. No further seizure activity described since. She is currently intubated, sedated, hemodynamically stable.   PAST MEDICAL HISTORY :  She  has a past medical history of OSA (obstructive sleep apnea); CHF (congestive heart failure) ([place]); Diabetes mellitus; Altered mental status; RLS (restless legs syndrome); Acute and chronic respiratory failure with hypercapnia ([place]); Hypoxemia; Endometrial cancer ([place]); Morbid obesity ([place]); Depression; Hypothyroidism; GERD (gastroesophageal reflux disease); Nephrolithiasis; Chronic low back pain; ADD (attention deficit disorder); Restless leg syndrome; Coronary artery disease; Complication of anesthesia; Hypertension; and Hypercapnia.  PAST SURGICAL HISTORY: She  has past surgical history that includes Back surgery; Shoulder surgery; Vaginal hysterectomy; Dental surgery; Tonsillectomy; and SP PERC NEPHROSTOMY.  Allergies  Allergen Reactions  . Aspirin Other (See Comments)    Chest pain  . Eliquis [Apixaban]     Excessive bleeding  . Nsaids Other (See Comments)    Chest pain  . Other Other (See Comments)    "BP med" combined with plavix, caused syncope  . Plavix [Clopidogrel] Other (See Comments)    syncope  . Sudafed [Pseudoephedrine] Palpitations    Raised BP  . Erythromycin Nausea And  Vomiting  . Sulfa Antibiotics Other (See Comments)    constipation  . Sulfamethoxazole-Trimethoprim Diarrhea  . Altace [Ramipril] Other (See Comments)    Severe fatigue  . Zofran [Ondansetron Hcl] Other (See Comments)    "I put me into a coma"    No current facility-administered medications on file prior to encounter.   Current Outpatient Prescriptions on File Prior to Encounter  Medication Sig  . b complex vitamins tablet Take 1 tablet by mouth daily.   Marland Kitchen BIOTIN PO Take 1 tablet by mouth 2 (two) times daily.  . cetirizine (ZYRTEC) 10 MG tablet Take 10 mg by mouth at bedtime.  Marland Kitchen diltiazem (DILACOR XR) 240 MG 24 hr capsule Take 1 capsule (240 mg total) by mouth daily.  . furosemide (LASIX) 40 MG tablet Take 1 tablet (40 mg total) by mouth 2 (two) times daily. (Patient taking differently: Take 120 mg by mouth daily. )  . levothyroxine (SYNTHROID, LEVOTHROID) 75 MCG tablet Take 75 mcg by mouth daily.    . methadone (DOLOPHINE) 10 MG/5ML solution Take 70 mg by mouth every morning.   . nystatin (MYCOSTATIN/NYSTOP) 100000 UNIT/GM POWD Apply 1 g topically 2 (two) times daily as needed (for rash).  . pantoprazole (PROTONIX) 40 MG tablet Take 40 mg by mouth daily.  . potassium chloride SA (K-DUR,KLOR-CON) 20 MEQ tablet Take 20 mEq by mouth 2 (two) times daily.  . prochlorperazine (COMPAZINE) 10 MG tablet Take 10 mg by mouth every 6 (six) hours as needed for nausea or vomiting.  . propranolol (INDERAL) 40 MG tablet Take 40 mg by mouth 3 (three) times daily. 1 in the morning 1/2 in the afternoon 1 in the evening  .  sertraline (ZOLOFT) 100 MG tablet Take 200 mg by mouth at bedtime.   . clotrimazole-betamethasone (LOTRISONE) cream Apply 1 application topically 2 (two) times daily.    FAMILY HISTORY:  Her has no family status information on file.   SOCIAL HISTORY: She  reports that she quit smoking about 41 years ago. Her smoking use included Cigarettes. She has a 1 pack-year smoking history. She  has never used smokeless tobacco. She reports that she does not drink alcohol or use illicit drugs.  REVIEW OF SYSTEMS:   Unable to obtain  SUBJECTIVE:  Unable to obtain  VITAL SIGNS: BP 113/91 mmHg  Pulse 90  Resp 22  SpO2 100%  HEMODYNAMICS:    VENTILATOR SETTINGS: Vent Mode:  [-] PRVC FiO2 (%):  [60 %-100 %] 60 % Set Rate:  [15 bmp-22 bmp] 22 bmp Vt Set:  [450 mL] 450 mL PEEP:  [5 cmH20] 5 cmH20  INTAKE / OUTPUT:    PHYSICAL EXAMINATION: General:  Morbidly obese woman, ventilated Neuro:  Opens eyes to voice, some tremor vs intentional jerking UE and LE's. She is able to trach while the jerking occurs HEENT:  ETT in place, OP moist, PERRL Cardiovascular:  Regular, ? Intermittent A flutter on monitor, no M Lungs:  Coarse B, crackles B Abdomen:  Obese soft NT, + BS Musculoskeletal:  B LE 2-3+ edema to thighs Skin:  No obvious rash or lesions. -  LABS:  BMET  Recent Labs Lab 12/25/15 1256  NA 140  K 5.7*  CL 104  CO2 28  BUN 18  CREATININE 1.33*  GLUCOSE 157*    Electrolytes  Recent Labs Lab 12/25/15 1256  CALCIUM 8.3*    CBC  Recent Labs Lab 12/25/15 1130  WBC 14.4*  HGB 12.2  HCT 43.2  PLT 304    Coag's No results for input(s): APTT, INR in the last 168 hours.  Sepsis Markers  Recent Labs Lab 12/25/15 1202  LATICACIDVEN 1.62    ABG  Recent Labs Lab 12/25/15 1308  PHART 7.133*  PCO2ART 99.8*  PO2ART 132.0*    Liver Enzymes  Recent Labs Lab 12/25/15 1256  AST 31  ALT 15  ALKPHOS 111  BILITOT 0.9  ALBUMIN 3.1*    Cardiac Enzymes No results for input(s): TROPONINI, PROBNP in the last 168 hours.  Glucose  Recent Labs Lab 12/25/15 1146  GLUCAP 187*    Imaging Ct Head Wo Contrast  12/25/2015  CLINICAL DATA:  Short of breath, cough, unresponsive. EXAM: CT HEAD WITHOUT CONTRAST TECHNIQUE: Contiguous axial images were obtained from the base of the skull through the vertex without intravenous contrast.  COMPARISON:  None. FINDINGS: Patient intubated. No acute intracranial hemorrhage. No focal mass lesion. No CT evidence of acute infarction. No midline shift or mass effect. No hydrocephalus. Basilar cisterns are patent. Paranasal sinuses and  mastoid air cells are clear. IMPRESSION: 1. No acute intracranial findings. 2. Intubated patient. Electronically Signed   By: Suzy Bouchard M.D.   On: 12/25/2015 12:34   Dg Chest Port 1 View  12/25/2015  CLINICAL DATA:  Evaluate endotracheal tube placement. Seizure activity. EXAM: PORTABLE CHEST 1 VIEW COMPARISON:  12/23/2015 FINDINGS: Endotracheal tube positioned 3.4 cm from carina. NG tube extends the stomach. Normal cardiac silhouette. There is of the increased central venous congestion. Air bronchograms in the LEFT lower lobe. No pneumothorax IMPRESSION: 1. Endotracheal tube and NG tube appear in good position. 2. Increased perihilar airspace disease representing edema versus aspiration. Electronically Signed   By:  Suzy Bouchard M.D.   On: 12/25/2015 13:14    STUDIES:  Head CT 12/25/15 -- No acute findings.   CULTURES: Blood cx 1/14 >>  Urine cx 1/14 >>  Resp cx 1/14 >>   ANTIBIOTICS:  SIGNIFICANT EVENTS: Intubated 1/14  LINES/TUBES: ETT 1/14 >>   DISCUSSION: 88 obese woman, chronic resp failure due to OSA/OHS, found unresponsive and hypoxic. Supported with MV. Developed signs of seizures, treated with ativan. Unclear whether this was primary seizure activity vs response to severe hypoxemia.   ASSESSMENT / PLAN:  PULMONARY A: Acute on chronic respiratory failure OSA / OHS Aspiration PNA P:   MV support, PRVC 8cc/kg abx as below Assess for SBT once MS improves  CARDIOVASCULAR A:  HTN P:  Hold lasix, propranolol and lisinopril at this time.   RENAL A:   Hypernatremia P:   BMP now, treat if persists after IVF and correction acidosis  GASTROINTESTINAL A:   SUP GERD P:   Pantoprazole (sub for home  lansoprazole)  HEMATOLOGIC A:   Leukocytosis P:  Follow CBC  INFECTIOUS A:   Aspiration PNA P:   Start unasyn 1/14 Collect cultures and follow  ENDOCRINE A:   DM2   Hypothyroidism  P:   SSI insulin per ICU protocol Synthroid IV  NEUROLOGIC A:   Possible seizure activity Obtundation due to hypercapnia; note CT head 1/14 reassuring P:   RASS goal: 0 to -1 Will try to minimize sedation Favor keppra load and then hold off on maintenance given uncertainty about cause of seizure activity. If she seizes again then clearly she will need maintenance therapy EEG if remains unresponsive to r/o occult status epilepticus.    FAMILY  - Updates: Case and status discussed with the pt's granddaughter at bedside.   - Inter-disciplinary family meet or Palliative Care meeting due by: 01/01/16    Independent CC time 59 minutes  Baltazar Apo, MD, PhD 12/25/2015, 2:32 PM [facility] [phone] or if no answer [phone]

## 2015-12-25 NOTE — Progress Notes (Signed)
Patient transferred to room 2M16 without any apparent complications.

## 2015-12-25 NOTE — ED Notes (Signed)
Code Sepsis Activated

## 2015-12-25 NOTE — Progress Notes (Signed)
   12/25/15 1338  Clinical Encounter Type  Visited With Patient and family together;Health care provider  Visit Type Initial;Critical Care;Spiritual support  Referral From Nurse  Spiritual Encounters  Spiritual Needs Prayer;Emotional  Stress Factors  Family Stress Factors Health changes   Chaplain responded to a patient in trauma C who appears to be having oxygen issues and potential seizures. Chaplain offered the family support and prayer, and our services available as needed.   Jeri Lager, Chaplain 12/25/2015 1:40 PM

## 2015-12-25 NOTE — Progress Notes (Signed)
eLink Physician-Brief Progress Note Patient Name: [name] DOB: December 29, 1955 MRN: [id number]   Date of Service  12/25/2015  HPI/Events of Note  Hypothyroid - Takes Synthroid 75 mcg/day at home. Now Synthroid IV infusion ordered.  eICU Interventions  Will D/C Synthroid IV infusion and give Synthroid 50 mcg IV now and Q day.      Intervention Category Intermediate Interventions: Other:  Lysle Dingwall 12/25/2015, 10:49 PM

## 2015-12-26 ENCOUNTER — Encounter (HOSPITAL_COMMUNITY): Payer: Self-pay | Admitting: Emergency Medicine

## 2015-12-26 DIAGNOSIS — N179 Acute kidney failure, unspecified: Secondary | ICD-10-CM

## 2015-12-26 LAB — BASIC METABOLIC PANEL
Anion gap: 9 (ref 5–15)
BUN: 15 mg/dL (ref 6–20)
CHLORIDE: 105 mmol/L (ref 101–111)
CO2: 29 mmol/L (ref 22–32)
Calcium: 8.2 mg/dL — ABNORMAL LOW (ref 8.9–10.3)
Creatinine, Ser: 1.05 mg/dL — ABNORMAL HIGH (ref 0.44–1.00)
GFR calc Af Amer: >60 mL/min (ref >60)
GFR calc non Af Amer: 56 mL/min — ABNORMAL LOW (ref >60)
GLUCOSE: 126 mg/dL — AB (ref 65–99)
POTASSIUM: 4.1 mmol/L (ref 3.5–5.1)
SODIUM: 143 mmol/L (ref 135–145)

## 2015-12-26 LAB — URINE CULTURE: CULTURE: NO GROWTH

## 2015-12-26 LAB — CBC
HEMATOCRIT: 35.7 % — AB (ref 36.0–46.0)
HEMOGLOBIN: 10.2 g/dL — AB (ref 12.0–15.0)
MCH: 24.3 pg — AB (ref 26.0–34.0)
MCHC: 28.6 g/dL — ABNORMAL LOW (ref 30.0–36.0)
MCV: 85 fL (ref 78.0–100.0)
Platelets: 180 10*3/uL (ref 150–400)
RBC: 4.2 MIL/uL (ref 3.87–5.11)
RDW: 16.3 % — ABNORMAL HIGH (ref 11.5–15.5)
WBC: 9 10*3/uL (ref 4.0–10.5)

## 2015-12-26 LAB — MRSA PCR SCREENING: MRSA by PCR: NEGATIVE

## 2015-12-26 LAB — GLUCOSE, CAPILLARY
GLUCOSE-CAPILLARY: 124 mg/dL — AB (ref 65–99)
GLUCOSE-CAPILLARY: 95 mg/dL (ref 65–99)
GLUCOSE-CAPILLARY: 154 mg/dL — AB (ref 65–99)
Glucose-Capillary: 110 mg/dL — ABNORMAL HIGH (ref 65–99)
Glucose-Capillary: 134 mg/dL — ABNORMAL HIGH (ref 65–99)
Glucose-Capillary: 81 mg/dL (ref 65–99)

## 2015-12-26 LAB — PROCALCITONIN

## 2015-12-26 MED ORDER — FUROSEMIDE 10 MG/ML IJ SOLN
40.0000 mg | Freq: Every day | INTRAMUSCULAR | Status: DC
  Administered 2015-12-26 – 2015-12-28 (×3): 40 mg via INTRAVENOUS
  Filled 2015-12-26 (×3): qty 4

## 2015-12-26 MED ORDER — DILTIAZEM LOAD VIA INFUSION
20.0000 mg | Freq: Once | INTRAVENOUS | Status: AC
  Administered 2015-12-26: 20 mg via INTRAVENOUS
  Filled 2015-12-26: qty 20

## 2015-12-26 MED ORDER — FENTANYL CITRATE (PF) 100 MCG/2ML IJ SOLN
50.0000 ug | INTRAMUSCULAR | Status: DC | PRN
Start: 2015-12-26 — End: 2015-12-26
  Administered 2015-12-26: 50 ug via INTRAVENOUS
  Administered 2015-12-26 (×4): 75 ug via INTRAVENOUS
  Filled 2015-12-26 (×5): qty 2

## 2015-12-26 MED ORDER — DILTIAZEM HCL 100 MG IV SOLR
5.0000 mg/h | INTRAVENOUS | Status: DC
  Administered 2015-12-26: 5 mg/h via INTRAVENOUS
  Administered 2015-12-27 – 2015-12-29 (×6): 10 mg/h via INTRAVENOUS
  Filled 2015-12-26 (×7): qty 100

## 2015-12-26 NOTE — Progress Notes (Signed)
Spoke with daughter, Fransico Him, who lives in [place]. Daughter updated.  Password to be Brewing technologist. Contact information for [place]: [phone].

## 2015-12-26 NOTE — Progress Notes (Signed)
0900 Dr. Lamonte Sakai made aware or persistent elevated HR 130's. No new orders at this time.

## 2015-12-26 NOTE — Progress Notes (Signed)
Returned unused fentanyl and versed gtts to pharmacy.

## 2015-12-26 NOTE — Progress Notes (Signed)
eLink Physician-Brief Progress Note Patient Name: MEKLIT WURZER DOB: 05/19/1956 MRN: [id number]   Date of Service  12/26/2015  HPI/Events of Note  RN notified of frequent prn Fentanyl IV. Patient on methadone at home chronically.  eICU Interventions  1. Restart Fentanyl gtt 2. Fentanyl IV bolus via drip prn     Intervention Category Intermediate Interventions: Pain - evaluation and management  Tera Partridge 12/26/2015, 11:34 PM

## 2015-12-26 NOTE — Progress Notes (Signed)
15 Dr. Lamonte Sakai made aware of made aware of  elevated HR maintaining in the 150's- 160 range. See new orders.

## 2015-12-26 NOTE — Progress Notes (Signed)
Utilization review completed.  

## 2015-12-26 NOTE — Clinical Documentation Improvement (Signed)
Critical Care  Can the diagnosis of altered mental status be further specified in progress notes and discharge summary?   Hypoxic encephalopathy  Other  Clinically Undetermined  Document any associated diagnoses/conditions.   Supporting Information:  History of OSA, hypoxemia, A/C respiratory failure, CAD, hypercapnia, and Morbid obesity  H&P: Chief Complaint  Patient presents with  . Altered Mental Status       When EMS arrived they noted the patient's oxygen saturation to be 50% on room air with minimal responsiveness.  1/14 progress note: Obtundation due to hypercapnia; note CT head 1/14 reassuring  1/15 progress note: Acute encephalopathy due to hypercapnia - normal CT head  Component     Latest Ref Rng 12/25/2015        10:01 PM  FIO2        Delivery systems        Mode        VT        LHR        Hi Frequency JET Vent Rate        Peep/cpap        pH, Arterial     7.350 - 7.450 7.380  pCO2 arterial     35.0 - 45.0 mmHg 49.0 (H)  pO2, Arterial     80.0 - 100.0 mmHg 55.0 (L)  Bicarbonate     20.0 - 24.0 mEq/L 29.0 (H)  TCO2     0 - 100 mmol/L 30  Acid-Base Excess     0.0 - 2.0 mmol/L 3.0 (H)  O2 Saturation      87.0  Patient temperature      98.6 F  Collection site      RADIAL, ALLEN'S TEST ACCEPTABLE  Drawn by      RT  Sample type      ARTERIAL  Allens test (pass/fail)     PASS    Component     Latest Ref Rng 12/26/2015          FIO2      0.70  Delivery systems      VENTILATOR  Mode      PRESSURE REGULATED VOLUME CONTROL  VT      450  LHR      18  Hi Frequency JET Vent Rate      PENDING  Peep/cpap      5.0  pH, Arterial     7.350 - 7.450 7.349 (L)  pCO2 arterial     35.0 - 45.0 mmHg 55.4 (H)  pO2, Arterial     80.0 - 100.0 mmHg 66.5 (L)  Bicarbonate     20.0 - 24.0 mEq/L 29.7 (H)  TCO2     0 - 100 mmol/L 31.4  Acid-Base Excess     0.0 - 2.0 mmol/L 4.5 (H)  O2 Saturation      93.2  Patient temperature   98.6  Collection site      LEFT RADIAL  Drawn by      [id number]  Sample type      ARTERIAL DRAW  Allens test (pass/fail)     PASS PASS    Treatment: Mechanical ventilation Continuous pulse ox   Please exercise your independent, professional judgment when responding. A specific answer is not anticipated or expected.   Thank You,  IXL [phone]

## 2015-12-26 NOTE — Progress Notes (Signed)
PULMONARY / CRITICAL CARE MEDICINE   Name: [name] MRN: [id number] DOB: 1956/04/21    ADMISSION DATE:  12/25/2015 CONSULTATION DATE:  12/24/14  BRIEF PATIENT DESCRIPTION: 60 yo F found unresponsive at home found to be hypoxic and had possible seizure activity requiring intubation.   SIGNIFICANT EVENTS / STUDIES:  1/14 Found unresponsive with hypoxia and possible seizure activity requiring intubation and ativan/keppra 1/14 CT head with no acute findings    LINES / TUBES: 1/14 ETT >   CULTURES: 1/14 MRSA neg 1/14 Blood >  1/14 Urine >  1/14 Resp >    ANTIBIOTICS:   1/14 Unasyn > 1/14 Cefepime > 1/14 Vanc >  SUBJECTIVE: Pt febrile overnight to 100.6 and hypoglycemic to 62. No seizure activity overnight. Wants mitts and tube out.  VITAL SIGNS: Temp:  [99.1 F (37.3 C)-100.6 F (38.1 C)] 99.1 F (37.3 C) (01/15 0600) Pulse Rate:  [43-119] 119 (01/15 0600) Resp:  [3-42] 18 (01/15 0600) BP: (100-162)/(63-132) 134/73 mmHg (01/15 0600) SpO2:  [48 %-100 %] 99 % (01/15 0600) FiO2 (%):  [60 %-100 %] 70 % (01/15 0400) Weight:  [329 lb 9.4 oz (149.5 kg)] 329 lb 9.4 oz (149.5 kg) (01/14 2100) HEMODYNAMICS:   VENTILATOR SETTINGS: Vent Mode:  [-] PRVC FiO2 (%):  [60 %-100 %] 70 % Set Rate:  [15 bmp-22 bmp] 18 bmp Vt Set:  [450 mL] 450 mL PEEP:  [5 cmH20] 5 cmH20 Plateau Pressure:  [26 cmH20-27 cmH20] 27 cmH20 INTAKE / OUTPUT: Intake/Output      01/14 0701 - 01/15 0700 01/15 0701 - 01/16 0700   I.V. (mL/kg) 173.2 (1.2)    IV Piggyback 310    Total Intake(mL/kg) 483.2 (3.2)    Urine (mL/kg/hr) 1460    Total Output 1460     Net -976.8            PHYSICAL EXAMINATION: General: Alert, mitts on Neuro:  Mildly agitated HEENT:  Intubated Cardiovascular:  Tachycardic and regular rhythm  Lungs: Crackles in ant Abdomen: Obese, soft, non-tender Musculoskeletal: +2 b/l pitting edema  Skin: No rashes  LABS:  CBC  Recent Labs Lab 12/25/15 1130 12/25/15 2120  12/26/15 0307  WBC 14.4* 9.5 9.0  HGB 12.2 11.0* 10.2*  HCT 43.2 39.1 35.7*  PLT 304 183 180   Coag's No results for input(s): APTT, INR in the last 168 hours. BMET  Recent Labs Lab 12/25/15 1256 12/25/15 2120 12/26/15 0307  NA 140 142 143  K 5.7* 4.0 4.1  CL 104 103 105  CO2 28 28 29   BUN 18 16 15   CREATININE 1.33* 1.14* 1.05*  GLUCOSE 157* 100* 126*   Electrolytes  Recent Labs Lab 12/25/15 1256 12/25/15 2120 12/26/15 0307  CALCIUM 8.3* 8.2* 8.2*   Sepsis Markers  Recent Labs Lab 12/25/15 1202 12/25/15 1830 12/25/15 2120  LATICACIDVEN 1.62 2.15*  --   PROCALCITON  --   --  <0.10   ABG  Recent Labs Lab 12/25/15 1308 12/25/15 2201 12/26/15 0345  PHART 7.133* 7.380 7.349*  PCO2ART 99.8* 49.0* 55.4*  PO2ART 132.0* 55.0* 66.5*   Liver Enzymes  Recent Labs Lab 12/25/15 1256  AST 31  ALT 15  ALKPHOS 111  BILITOT 0.9  ALBUMIN 3.1*   Cardiac Enzymes No results for input(s): TROPONINI, PROBNP in the last 168 hours. Glucose  Recent Labs Lab 12/25/15 1146 12/25/15 2102 12/25/15 2132 12/25/15 2331 12/26/15 0426  GLUCAP 187* 62* 74 81 110*    Imaging Ct Head Wo Contrast  12/25/2015  CLINICAL DATA:  Short of breath, cough, unresponsive. EXAM: CT HEAD WITHOUT CONTRAST TECHNIQUE: Contiguous axial images were obtained from the base of the skull through the vertex without intravenous contrast. COMPARISON:  None. FINDINGS: Patient intubated. No acute intracranial hemorrhage. No focal mass lesion. No CT evidence of acute infarction. No midline shift or mass effect. No hydrocephalus. Basilar cisterns are patent. Paranasal sinuses and  mastoid air cells are clear. IMPRESSION: 1. No acute intracranial findings. 2. Intubated patient. Electronically Signed   By: Suzy Bouchard M.D.   On: 12/25/2015 12:34   Dg Chest Port 1 View  12/25/2015  CLINICAL DATA:  Evaluate endotracheal tube placement. Seizure activity. EXAM: PORTABLE CHEST 1 VIEW COMPARISON:   12/23/2015 FINDINGS: Endotracheal tube positioned 3.4 cm from carina. NG tube extends the stomach. Normal cardiac silhouette. There is of the increased central venous congestion. Air bronchograms in the LEFT lower lobe. No pneumothorax IMPRESSION: 1. Endotracheal tube and NG tube appear in good position. 2. Increased perihilar airspace disease representing edema versus aspiration. Electronically Signed   By: Suzy Bouchard M.D.   On: 12/25/2015 13:14     ASSESSMENT / PLAN:   PULMONARY A:  Acute on chronic hypercapnic respiratory failure OSA / OHS Aspiration pneumonia  History of tobacco use P:  Full vent settings, wean FIO2/PEEP to SpO2> 92% SBT today, hopeful for extubation Albuterol PRN  Antibiotics in ID section  CPAP at night as tolerated   CARDIOVASCULAR A:  Sinus tachycardia  Acute on chronic CHF - last EF 55-60%, BNP elevated at 370 Moderate mitral regurgation  Hypertension CAD P:  Continue lopressor 12.5 mg BID Consider restarting home lasix  Hold home losartan and propanolol   RENAL A: Non-oliguric AKI (baseline Cr 0.9-1) - resolved P:  Monitor BMP  Monitor UOP  GASTROINTESTINAL A:  GI PPx  GERD Morbid obesity P:  Continue protonix 40 mg daily   HEMATOLOGIC/ONCOLOGICAL  A: Chronic normocytic anemia  Leukocytosis - resolved  History of endometrial cancer DVT Ppx P:  Monitor CBC SQ heparin TID  INFECTIOUS A: Aspiration PNA P:  Continue unasyn D/C vanc and cefepime   Follow-up cultures  Monitor PCT - <0.10  ENDOCRINE A:  Hypoglycemia overnight - CBG 62 Non-insulin dependent Type 2 DM - last A1c 6.5 on 11/09/14  Hypothyroidism  P:  SSI insulin per ICU protocol Continue IV synthroid 50 mcg daily     NEUROLOGIC A:  Possible seizure activity, most likely secondary due to hypoxia, received keppra load  Acute encephalopathy due to hypercapnia - normal CT head  Methadone dependence  Adult ADHD Depression  P:   RASS goal: 0 to -1 Wean fentanyl infusion  Fentanyl and versed PRN agitation   WUA today Hold home methadone, ritalin, and zoloft  Marjan Rabbani  PGY-3 IMTS Pager [phone]   TODAY'S SUMMARY:  I have personally obtained a history, examined the patient, evaluated laboratory and imaging results, formulated the assessment and plan and placed orders.  CRITICAL CARE: The patient is critically ill with multiple organ systems failure and requires high complexity decision making for assessment and support, frequent evaluation and titration of therapies, application of advanced monitoring technologies and extensive interpretation of multiple databases. Critical Care Time devoted to patient care services described in this note is 35  minutes.    Attending Note:  I have examined patient, reviewed labs, studies and notes. I have discussed the case with Dr Mathis Fare, and I agree with the data and plans as amended above. Morbidly  obese woman with OSA / OHS / methadone use who was found down with hypoxemia. Required intubation and MV. CXR with B aspiration PNA's. She may have had seizure activity in the ED as she was being stabilized - Keppra was loaded. On my eval she wakes to stim, she is on 70% FiO2, has crackles bilaterally. Renal fxn has normalized. We will continue her current vent support, treat with unasyn, restart lasix (reported to take 120mg  po at home). Goal decrease her Fio2 and move towards SBT's. Independent critical care time is 35 minutes.   Baltazar Apo, MD, PhD 12/26/2015, 1:29 PM [facility] [phone] or if no answer [phone]

## 2015-12-27 ENCOUNTER — Inpatient Hospital Stay (HOSPITAL_COMMUNITY): Payer: Medicare Other

## 2015-12-27 DIAGNOSIS — A419 Sepsis, unspecified organism: Principal | ICD-10-CM

## 2015-12-27 DIAGNOSIS — J189 Pneumonia, unspecified organism: Secondary | ICD-10-CM

## 2015-12-27 DIAGNOSIS — J690 Pneumonitis due to inhalation of food and vomit: Secondary | ICD-10-CM | POA: Diagnosis present

## 2015-12-27 LAB — BASIC METABOLIC PANEL
Anion gap: 10 (ref 5–15)
BUN: 15 mg/dL (ref 6–20)
CHLORIDE: 105 mmol/L (ref 101–111)
CO2: 30 mmol/L (ref 22–32)
Calcium: 8.1 mg/dL — ABNORMAL LOW (ref 8.9–10.3)
Creatinine, Ser: 1.24 mg/dL — ABNORMAL HIGH (ref 0.44–1.00)
GFR calc Af Amer: 53 mL/min — ABNORMAL LOW (ref >60)
GFR, EST NON AFRICAN AMERICAN: 46 mL/min — AB (ref >60)
GLUCOSE: 109 mg/dL — AB (ref 65–99)
POTASSIUM: 3.4 mmol/L — AB (ref 3.5–5.1)
SODIUM: 145 mmol/L (ref 135–145)

## 2015-12-27 LAB — CBC
HCT: 36 % (ref 36.0–46.0)
HEMOGLOBIN: 10.7 g/dL — AB (ref 12.0–15.0)
MCH: 24.7 pg — ABNORMAL LOW (ref 26.0–34.0)
MCHC: 29.7 g/dL — ABNORMAL LOW (ref 30.0–36.0)
MCV: 82.9 fL (ref 78.0–100.0)
Platelets: 150 10*3/uL (ref 150–400)
RBC: 4.34 MIL/uL (ref 3.87–5.11)
RDW: 16.6 % — ABNORMAL HIGH (ref 11.5–15.5)
WBC: 7.6 10*3/uL (ref 4.0–10.5)

## 2015-12-27 LAB — GLUCOSE, CAPILLARY
GLUCOSE-CAPILLARY: 106 mg/dL — AB (ref 65–99)
GLUCOSE-CAPILLARY: 106 mg/dL — AB (ref 65–99)
GLUCOSE-CAPILLARY: 110 mg/dL — AB (ref 65–99)
GLUCOSE-CAPILLARY: 117 mg/dL — AB (ref 65–99)
Glucose-Capillary: 163 mg/dL — ABNORMAL HIGH (ref 65–99)
Glucose-Capillary: 110 mg/dL — ABNORMAL HIGH (ref 65–99)

## 2015-12-27 LAB — HIV ANTIBODY (ROUTINE TESTING W REFLEX): HIV SCREEN 4TH GENERATION: NONREACTIVE

## 2015-12-27 LAB — MAGNESIUM: MAGNESIUM: 1.5 mg/dL — AB (ref 1.7–2.4)

## 2015-12-27 MED ORDER — SERTRALINE HCL 100 MG PO TABS
200.0000 mg | ORAL_TABLET | Freq: Every day | ORAL | Status: DC
  Administered 2015-12-27 – 2015-12-28 (×2): 200 mg
  Filled 2015-12-27 (×3): qty 2

## 2015-12-27 MED ORDER — SODIUM CHLORIDE 0.9 % IV SOLN
6.0000 g | Freq: Once | INTRAVENOUS | Status: AC
  Administered 2015-12-27: 6 g via INTRAVENOUS
  Filled 2015-12-27: qty 12

## 2015-12-27 MED ORDER — METOPROLOL TARTRATE 25 MG PO TABS
25.0000 mg | ORAL_TABLET | Freq: Two times a day (BID) | ORAL | Status: DC
  Administered 2015-12-27 – 2015-12-30 (×6): 25 mg via ORAL
  Filled 2015-12-27 (×7): qty 1

## 2015-12-27 MED ORDER — POTASSIUM CHLORIDE 20 MEQ/15ML (10%) PO SOLN
20.0000 meq | ORAL | Status: AC
  Administered 2015-12-27 (×2): 20 meq
  Filled 2015-12-27 (×2): qty 15

## 2015-12-27 NOTE — Progress Notes (Signed)
[facility] ADULT ICU REPLACEMENT PROTOCOL FOR AM LAB REPLACEMENT ONLY  The patient does apply for the [facility] Adult ICU Electrolyte Replacment Protocol based on the criteria listed below:   1. Is GFR >/= 40 ml/min? Yes.    Patient's GFR today is 50 2. Is urine output >/= 0.5 ml/kg/hr for the last 6 hours? Yes.   Patient's UOP is 0.8 ml/kg/hr 3. Is BUN < 60 mg/dL? Yes.    Patient's BUN today is 15 4. Abnormal electrolyte(s): K 3.4, Mag 1.5 5. Ordered repletion with: per protocol 6. If a panic level lab has been reported, has the CCM MD in charge been notified? No..   Physician:    Ronda Fairly A 12/27/2015 5:30 AM

## 2015-12-27 NOTE — Progress Notes (Signed)
Initial Nutrition Assessment  DOCUMENTATION CODES:   Obesity unspecified  INTERVENTION:  -RD to continue to monitor for needs -Does not appear tube-feeding will be necessary for this patient at this time -If Tubefeed is needed begin Vital High Protein @ 75mL/hr per PEPUP Protocol   Day 1 of TF protocol start feed at 25 mL/hr for the remainder of the day.   Day 2 of TF protocol at 0600 start new goal rate of 32mL (50 mL/hr) run from 0600-05:59.   Maintain at rate ordered unless TF's are held. If TF's are held, calculate the new volume to be provided and adjust the rate to provide total volume ordered within 24 hours (total volume-volume fed, divide this by remaining hours in the 24 hr period to get new goal rate). Max rate of 150 mL/hr. Each day at 0600 return to ordered rate.  Pro-Stat 54mL BID  Tube-feeding regimen provides 1400 calories, 135g protein, 1003cc free H2O  NUTRITION DIAGNOSIS:   Inadequate oral intake related to inability to eat as evidenced by NPO status.  GOAL:   Provide needs based on ASPEN/SCCM guidelines  MONITOR:   I & O's, Diet advancement, Vent status, Labs, Weight trends, Skin  REASON FOR ASSESSMENT:   Ventilator    ASSESSMENT:   60 yo morbidly obese woman, Hx OSA / OHS / RLS (followed VS), DM, HTN, chronic resp failure. Has been treated recently as outpt for possible bronchitis. She was found by her daughter unresponsive am 1/14. On EMS arrival SpO2 ~ 50's%, improved with O2. Experienced increasing rhythmic movements consistent w seizure activity, slowed with ativan in ED. She was obtunded and required intubation for MV. No further seizure activity described since. She is currently intubated, sedated, hemodynamically stable.   Pt intubated, but awake and aware at bedside. Patient is currently intubated on ventilator support MV: 9.7 L/min Temp (24hrs), Avg:100 F (37.8 C), Min:99.1 F (37.3 C), Max:102.2 F (39 C)  Propofol: None Drips:  Diltiazem, Lopressor, Synthroid Sedation: Fentanyl, Versed  Spoke with pt briefly at bedside. Pt was concerned with when she is going to be extubated; was not sure at the time. Pt states she was eating well prior to admission. Some nausea, no chewing or swallowing problems, no d/v/c. Pt had a AKI, but has since resolved. Currently suffering from aspiration PNA. From MD note, pt is approaching extubation, but it is possible PNA may prevent it.  Nutrition-Focused physical exam completed. Findings are no fat depletion, no muscle depletion, and moderate edema.   Labs: CBGs 110-163, K 3.4, Cr 1.24, Mg 1.5 Medications reviewed.  Diet Order:  Diet NPO time specified  Skin:  Reviewed, no issues  Last BM:  PTA  Height:   Ht Readings from Last 1 Encounters:  10/29/15 5\' 5"  (1.651 m)    Weight:   Wt Readings from Last 1 Encounters:  12/27/15 324 lb 8.3 oz (147.2 kg)    Ideal Body Weight:     BMI:  Body mass index is 54 kg/(m^2).  Estimated Nutritional Needs:   Kcal:  1250-1420  Protein:  142 grams  Fluid:  >/= 1L  EDUCATION NEEDS:   No education needs identified at this time  Satira Anis. Sebastain Fishbaugh, MS, RD LDN After Hours/Weekend Pager [phone]

## 2015-12-27 NOTE — Progress Notes (Signed)
PULMONARY / CRITICAL CARE MEDICINE   Name: KD BORUCH MRN: [id number] DOB: Dec 23, 1955    ADMISSION DATE:  12/25/2015 CONSULTATION DATE:  12/24/14  BRIEF PATIENT DESCRIPTION: 60 yo F found unresponsive at home found to be hypoxic and had possible seizure activity requiring intubation.   SIGNIFICANT EVENTS / STUDIES:  1/14 Found unresponsive with hypoxia and possible seizure activity requiring intubation and ativan/keppra 1/14 CT head with no acute findings  1/15 Sinus tachycardia requiring diltiazem drip   LINES / TUBES: 1/14 ETT >    CULTURES: 1/14 MRSA neg 1/14 Blood >  1/14 Urine > negative 1/14 Resp >    ANTIBIOTICS:  1/14 Unasyn > 1/14 Cefepime >1/15 1/14 Vanc >1/15  SUBJECTIVE: Pt febrile overnight to 100.4. No seizure activity overnight. Also tachycardic to 131 yesterday requiring diltiazem drip. Required fentanyl drip. Weaning to FiO2 40%.  VITAL SIGNS: Temp:  [99.1 F (37.3 C)-102.2 F (39 C)] 99.3 F (37.4 C) (01/16 0600) Pulse Rate:  [91-162] 110 (01/16 0600) Resp:  [16-26] 18 (01/16 0600) BP: (103-151)/(42-102) 138/98 mmHg (01/16 0600) SpO2:  [93 %-99 %] 97 % (01/16 0600) FiO2 (%):  [40 %-50 %] 40 % (01/16 0400) Weight:  [324 lb 8.3 oz (147.2 kg)] 324 lb 8.3 oz (147.2 kg) (01/16 0500) HEMODYNAMICS:   VENTILATOR SETTINGS: Vent Mode:  [-] PRVC FiO2 (%):  [40 %-50 %] 40 % Set Rate:  [18 bmp] 18 bmp Vt Set:  [450 mL] 450 mL PEEP:  [5 cmH20] 5 cmH20 Plateau Pressure:  [20 cmH20-25 cmH20] 25 cmH20 INTAKE / OUTPUT: Intake/Output      01/15 0701 - 01/16 0700   I.V. (mL/kg) 497.1 (3.4)   NG/GT 60   IV Piggyback 150   Total Intake(mL/kg) 707.1 (4.8)   Urine (mL/kg/hr) 3775 (1.1)   Total Output 3775   Net -3067.9         PHYSICAL EXAMINATION: General: NAD Neuro: Alert and follows commands  HEENT:  Intubated Cardiovascular:  Tachycardic with regular rhythm  Lungs: Crackles in anterior fields Abdomen: Obese, soft, non-tender, non-distended,  normal BS Musculoskeletal: +1 b/l pitting edema  Skin: No rashes  LABS:  CBC  Recent Labs Lab 12/25/15 2120 12/26/15 0307 12/27/15 0232  WBC 9.5 9.0 7.6  HGB 11.0* 10.2* 10.7*  HCT 39.1 35.7* 36.0  PLT 183 180 150   Coag's No results for input(s): APTT, INR in the last 168 hours. BMET  Recent Labs Lab 12/25/15 2120 12/26/15 0307 12/27/15 0232  NA 142 143 145  K 4.0 4.1 3.4*  CL 103 105 105  CO2 28 29 30   BUN 16 15 15   CREATININE 1.14* 1.05* 1.24*  GLUCOSE 100* 126* 109*   Electrolytes  Recent Labs Lab 12/25/15 2120 12/26/15 0307 12/27/15 0232  CALCIUM 8.2* 8.2* 8.1*  MG  --   --  1.5*   Sepsis Markers  Recent Labs Lab 12/25/15 1202 12/25/15 1830 12/25/15 2120 12/26/15 0307  LATICACIDVEN 1.62 2.15*  --   --   PROCALCITON  --   --  <0.10 <0.10   ABG  Recent Labs Lab 12/25/15 1308 12/25/15 2201 12/26/15 0345  PHART 7.133* 7.380 7.349*  PCO2ART 99.8* 49.0* 55.4*  PO2ART 132.0* 55.0* 66.5*   Liver Enzymes  Recent Labs Lab 12/25/15 1256  AST 31  ALT 15  ALKPHOS 111  BILITOT 0.9  ALBUMIN 3.1*   Cardiac Enzymes No results for input(s): TROPONINI, PROBNP in the last 168 hours. Glucose  Recent Labs Lab 12/26/15 0426 12/26/15 [id number]  12/26/15 1226 12/26/15 1627 12/26/15 1940 12/27/15 0008  GLUCAP 110* 124* 95 154* 134* 117*    Imaging Ct Head Wo Contrast  12/25/2015  CLINICAL DATA:  Short of breath, cough, unresponsive. EXAM: CT HEAD WITHOUT CONTRAST TECHNIQUE: Contiguous axial images were obtained from the base of the skull through the vertex without intravenous contrast. COMPARISON:  None. FINDINGS: Patient intubated. No acute intracranial hemorrhage. No focal mass lesion. No CT evidence of acute infarction. No midline shift or mass effect. No hydrocephalus. Basilar cisterns are patent. Paranasal sinuses and  mastoid air cells are clear. IMPRESSION: 1. No acute intracranial findings. 2. Intubated patient. Electronically Signed   By:  Suzy Bouchard M.D.   On: 12/25/2015 12:34   Dg Chest Port 1 View  12/25/2015  CLINICAL DATA:  Evaluate endotracheal tube placement. Seizure activity. EXAM: PORTABLE CHEST 1 VIEW COMPARISON:  12/23/2015 FINDINGS: Endotracheal tube positioned 3.4 cm from carina. NG tube extends the stomach. Normal cardiac silhouette. There is of the increased central venous congestion. Air bronchograms in the LEFT lower lobe. No pneumothorax IMPRESSION: 1. Endotracheal tube and NG tube appear in good position. 2. Increased perihilar airspace disease representing edema versus aspiration. Electronically Signed   By: Suzy Bouchard M.D.   On: 12/25/2015 13:14     ASSESSMENT / PLAN:   PULMONARY A: Acute on chronic hypercapnic respiratory failure requiring mechanical ventilation OSA / OHS Aspiration pneumonia  History of tobacco use P:  Full vent settings, wean FIO2/PEEP to SpO2> 92% SBT today, hopeful for extubation soon Albuterol PRN  Continue IV unasyn  CPAP or BiPAP at night mandatory   CARDIOVASCULAR A:  Sinus tachycardia  Acute on chronic CHF - last EF 55-60%, BNP elevated at 370 Moderate mitral regurgation  Hypertension CAD P:  Continue diltiazem drip Continue lopressor 12.5 mg BID > increase  Continue IV lasix 40 mg daily  Hold home losartan and propanolol   RENAL A: Non-oliguric AKI (baseline Cr 0.9-1) - resolved Hypokalemia - in setting of diuresis  Hypomagnesemia - in setting of diuresis  P:  Monitor BMP  Replete Mg to keep >2 Replete K to keep >4 Monitor UOP May need to decrease or stop diuresis depending on renal fxn  GASTROINTESTINAL A:  SUP  GERD Morbid obesity P:  Continue protonix 40 mg daily   HEMATOLOGIC/ONCOLOGICAL  A: Chronic normocytic anemia - Hg stable  Leukocytosis - resolved  History of endometrial cancer DVT Ppx P:  Monitor CBC SQ heparin TID  INFECTIOUS A: Aspiration PNA - PCT <0.10 P:  Continue unasyn Follow-up cultures   Follow-up HIV  ENDOCRINE A:  Hypoglycemia - resolved  Non-insulin dependent Type 2 DM - last A1c 6.5 on 11/09/14  Hypothyroidism  P:  SSI insulin per ICU protocol Continue IV synthroid 50 mcg daily     NEUROLOGIC A:  Questionable seizure activity, most likely secondary due to hypoxia s/p keppra load on admission Hypercapnic encephalopathy - normal CT head  Methadone dependence  Adult ADHD Depression  P:  RASS goal: 0 to -1 Wean fentanyl infusion  Fentanyl and versed PRN agitation   WUA today Hold home methadone, ritalin, and zoloft  Marjan Rabbani  PGY-3 IMTS Pager [phone]   Attending Note:  I have examined patient, reviewed labs, studies and notes. I have discussed the case with Dr Mathis Fare, and I agree with the data and plans as amended above. Acute on chronic resp failure requiring MV, c/b aspiration PNA, A fib + RVR. Chronic PAH with anasarca. Unclear whether  she ever had true seizures - not currently on keppra. She is approaching extubation, although I am concerned that her PNA may compromise her ability. On my eval she is awake and tolerating PSV 10, wants to be extubated. We will reassess this pm, continue abx. If she is extubated she will need CPAP or BiPAp mandatory.  Independent critical care time is 35 minutes.   Baltazar Apo, MD, PhD 12/27/2015, 1:04 PM [facility] [phone] or if no answer [phone]

## 2015-12-28 ENCOUNTER — Encounter (HOSPITAL_COMMUNITY): Payer: Self-pay | Admitting: Internal Medicine

## 2015-12-28 ENCOUNTER — Inpatient Hospital Stay (HOSPITAL_COMMUNITY): Payer: Medicare Other

## 2015-12-28 DIAGNOSIS — R569 Unspecified convulsions: Secondary | ICD-10-CM

## 2015-12-28 LAB — GLUCOSE, CAPILLARY
GLUCOSE-CAPILLARY: 100 mg/dL — AB (ref 65–99)
GLUCOSE-CAPILLARY: 103 mg/dL — AB (ref 65–99)
GLUCOSE-CAPILLARY: 104 mg/dL — AB (ref 65–99)
GLUCOSE-CAPILLARY: 114 mg/dL — AB (ref 65–99)
Glucose-Capillary: 104 mg/dL — ABNORMAL HIGH (ref 65–99)
Glucose-Capillary: 119 mg/dL — ABNORMAL HIGH (ref 65–99)

## 2015-12-28 LAB — BASIC METABOLIC PANEL
Anion gap: 13 (ref 5–15)
BUN: 15 mg/dL (ref 6–20)
CALCIUM: 8.3 mg/dL — AB (ref 8.9–10.3)
CO2: 34 mmol/L — AB (ref 22–32)
CREATININE: 1.19 mg/dL — AB (ref 0.44–1.00)
Chloride: 97 mmol/L — ABNORMAL LOW (ref 101–111)
GFR calc non Af Amer: 48 mL/min — ABNORMAL LOW (ref >60)
GFR, EST AFRICAN AMERICAN: 56 mL/min — AB (ref >60)
Glucose, Bld: 112 mg/dL — ABNORMAL HIGH (ref 65–99)
Potassium: 3.3 mmol/L — ABNORMAL LOW (ref 3.5–5.1)
Sodium: 144 mmol/L (ref 135–145)

## 2015-12-28 LAB — CBC
HCT: 34.8 % — ABNORMAL LOW (ref 36.0–46.0)
HEMOGLOBIN: 10.2 g/dL — AB (ref 12.0–15.0)
MCH: 24.5 pg — ABNORMAL LOW (ref 26.0–34.0)
MCHC: 29.3 g/dL — AB (ref 30.0–36.0)
MCV: 83.5 fL (ref 78.0–100.0)
Platelets: 188 10*3/uL (ref 150–400)
RBC: 4.17 MIL/uL (ref 3.87–5.11)
RDW: 16.8 % — AB (ref 11.5–15.5)
WBC: 8.2 10*3/uL (ref 4.0–10.5)

## 2015-12-28 LAB — CULTURE, RESPIRATORY

## 2015-12-28 LAB — MAGNESIUM: Magnesium: 2.2 mg/dL (ref 1.7–2.4)

## 2015-12-28 LAB — PHOSPHORUS: PHOSPHORUS: 3 mg/dL (ref 2.5–4.6)

## 2015-12-28 LAB — CULTURE, RESPIRATORY W GRAM STAIN

## 2015-12-28 MED ORDER — ENOXAPARIN SODIUM 150 MG/ML ~~LOC~~ SOLN
145.0000 mg | Freq: Two times a day (BID) | SUBCUTANEOUS | Status: DC
  Administered 2015-12-28: 145 mg via SUBCUTANEOUS
  Filled 2015-12-28 (×2): qty 0.97

## 2015-12-28 MED ORDER — RIVAROXABAN 20 MG PO TABS
20.0000 mg | ORAL_TABLET | Freq: Every day | ORAL | Status: DC
  Administered 2015-12-28 – 2016-01-02 (×6): 20 mg via ORAL
  Filled 2015-12-28 (×6): qty 1

## 2015-12-28 MED ORDER — POTASSIUM CHLORIDE 10 MEQ/100ML IV SOLN
10.0000 meq | INTRAVENOUS | Status: AC
  Administered 2015-12-28: 10 meq via INTRAVENOUS
  Filled 2015-12-28: qty 100

## 2015-12-28 MED ORDER — POTASSIUM CHLORIDE 20 MEQ/15ML (10%) PO SOLN
40.0000 meq | Freq: Every day | ORAL | Status: DC
  Administered 2015-12-28: 40 meq

## 2015-12-28 MED ORDER — DIGOXIN 0.25 MG/ML IJ SOLN
0.2500 mg | Freq: Once | INTRAMUSCULAR | Status: AC
  Administered 2015-12-28: 0.25 mg via INTRAVENOUS
  Filled 2015-12-28 (×2): qty 1

## 2015-12-28 MED ORDER — POTASSIUM CHLORIDE 20 MEQ/15ML (10%) PO SOLN
ORAL | Status: AC
  Filled 2015-12-28: qty 30

## 2015-12-28 MED ORDER — FUROSEMIDE 10 MG/ML IJ SOLN
40.0000 mg | Freq: Once | INTRAMUSCULAR | Status: AC
  Administered 2015-12-28: 40 mg via INTRAVENOUS

## 2015-12-28 MED ORDER — METHADONE HCL 10 MG PO TABS
5.0000 mg | ORAL_TABLET | Freq: Three times a day (TID) | ORAL | Status: DC
  Administered 2015-12-28 – 2015-12-30 (×7): 5 mg
  Filled 2015-12-28 (×7): qty 1

## 2015-12-28 MED ORDER — FUROSEMIDE 10 MG/ML IJ SOLN
80.0000 mg | Freq: Three times a day (TID) | INTRAMUSCULAR | Status: DC
  Filled 2015-12-28 (×3): qty 8

## 2015-12-28 MED ORDER — METOPROLOL TARTRATE 1 MG/ML IV SOLN
5.0000 mg | Freq: Once | INTRAVENOUS | Status: AC
  Administered 2015-12-28: 5 mg via INTRAVENOUS
  Filled 2015-12-28: qty 5

## 2015-12-28 MED ORDER — LEVOTHYROXINE SODIUM 100 MCG IV SOLR
37.5000 ug | Freq: Every day | INTRAVENOUS | Status: DC
  Filled 2015-12-28: qty 5

## 2015-12-28 MED ORDER — FUROSEMIDE 10 MG/ML IJ SOLN
80.0000 mg | Freq: Three times a day (TID) | INTRAMUSCULAR | Status: DC
  Administered 2015-12-28 (×2): 80 mg via INTRAVENOUS
  Filled 2015-12-28 (×3): qty 8

## 2015-12-28 NOTE — H&P (Deleted)
NEURO HOSPITALIST CONSULT NOTE   Requestig physician: Dr. Chase Caller   Reason for Consult: possible seizure  HPI:                                                                                                                                          [name] is an 60 y.o. female Hx OSA / OHS / RLS (followed VS), DM, HTN, chronic resp failure. Has been treated recently as outpt for possible bronchitis. She was found by her daughter unresponsive am 1/14. On EMS arrival SpO2 ~ 50's%, improved with O2. On arrival she was showing rhythmic motions thought to be seizure. The movements slowed down with ativan per chart. She was intubated in ED.  She was loaded with Keppra 1 Gram in ED but this was not continued. While in hospital no further seizure like events was noted. While intubated she did have episodes of elevated HR. At present time she is not having any seizure activity.  Patient is intubated but awake.  She writes that she has no seizure history but has had LE twitching--of note: she has RLS.   Past Medical History  Diagnosis Date  . OSA (obstructive sleep apnea)   . CHF (congestive heart failure) ([place])   . Diabetes mellitus   . Altered mental status     2nd to hypercapnea  . Acute and chronic respiratory failure with hypercapnia ([place])   . Hypoxemia   . Endometrial cancer ([place])   . Morbid obesity ([place])   . Depression   . Hypothyroidism   . GERD (gastroesophageal reflux disease)   . Nephrolithiasis   . Chronic low back pain   . ADD (attention deficit disorder)   . Restless leg syndrome   . Coronary artery disease   . Complication of anesthesia     " I AM SLOW MTO WAKE UP AT ITMES "  . Hypertension   . Hypercapnia     Past Surgical History  Procedure Laterality Date  . Back surgery      x2  . Shoulder surgery      x2  . Vaginal hysterectomy      x2  . Dental surgery    . Tonsillectomy    . Sp perc nephrostomy      Family History  Problem  Relation Age of Onset  . Cancer Father      Social History:  reports that she quit smoking about 41 years ago. Her smoking use included Cigarettes. She has a 1 pack-year smoking history. She has never used smokeless tobacco. She reports that she does not drink alcohol or use illicit drugs.  Allergies  Allergen Reactions  . Aspirin Other (See Comments)    Chest pain  . Eliquis [Apixaban]  Excessive bleeding  . Nsaids Other (See Comments)    Chest pain  . Other Other (See Comments)    "BP med" combined with plavix, caused syncope  . Plavix [Clopidogrel] Other (See Comments)    syncope  . Sudafed [Pseudoephedrine] Palpitations    Raised BP  . Erythromycin Nausea And Vomiting  . Sulfa Antibiotics Other (See Comments)    constipation  . Sulfamethoxazole-Trimethoprim Diarrhea  . Altace [Ramipril] Other (See Comments)    Severe fatigue  . Zofran [Ondansetron Hcl] Other (See Comments)    "I put me into a coma"    MEDICATIONS:                                                                                                                     Prior to Admission:  Prescriptions prior to admission  Medication Sig Dispense Refill Last Dose  . azithromycin (ZITHROMAX) 250 MG tablet Take 250-500 mg by mouth See admin instructions. Take 500 mg the first day. Take 250 mg by mouth for 4 days.   12/24/2015 at Unknown time  . b complex vitamins tablet Take 1 tablet by mouth daily.    12/24/2015 at Unknown time  . BIOTIN PO Take 1 tablet by mouth 2 (two) times daily.   12/24/2015 at Unknown time  . cetirizine (ZYRTEC) 10 MG tablet Take 10 mg by mouth at bedtime.   12/24/2015 at Unknown time  . diltiazem (DILACOR XR) 240 MG 24 hr capsule Take 1 capsule (240 mg total) by mouth daily. 30 capsule 1 12/24/2015 at Unknown time  . furosemide (LASIX) 40 MG tablet Take 1 tablet (40 mg total) by mouth 2 (two) times daily. (Patient taking differently: Take 120 mg by mouth daily. ) 60 tablet 3 12/24/2015 at  Unknown time  . levothyroxine (SYNTHROID, LEVOTHROID) 75 MCG tablet Take 75 mcg by mouth daily.     12/24/2015 at Unknown time  . losartan (COZAAR) 100 MG tablet Take 100 mg by mouth daily.   12/24/2015 at Unknown time  . methadone (DOLOPHINE) 10 MG/5ML solution Take 70 mg by mouth every morning.    12/24/2015 at Unknown time  . methylphenidate 36 MG PO CR tablet Take 72 mg by mouth daily.   12/24/2015 at Unknown time  . nystatin (MYCOSTATIN/NYSTOP) 100000 UNIT/GM POWD Apply 1 g topically 2 (two) times daily as needed (for rash).   unknown  . pantoprazole (PROTONIX) 40 MG tablet Take 40 mg by mouth daily.  6 12/24/2015 at Unknown time  . potassium chloride SA (K-DUR,KLOR-CON) 20 MEQ tablet Take 20 mEq by mouth 2 (two) times daily.   12/24/2015 at Unknown time  . prochlorperazine (COMPAZINE) 10 MG tablet Take 10 mg by mouth every 6 (six) hours as needed for nausea or vomiting.   unknown  . propranolol (INDERAL) 40 MG tablet Take 40 mg by mouth 3 (three) times daily. 1 in the morning 1/2 in the afternoon 1 in the evening   12/24/2015 at 1800  . sertraline (ZOLOFT)  100 MG tablet Take 200 mg by mouth at bedtime.    12/24/2015 at Unknown time  . clotrimazole-betamethasone (LOTRISONE) cream Apply 1 application topically 2 (two) times daily.   Taking   Scheduled: . ampicillin-sulbactam (UNASYN) IV  1.5 g Intravenous Q6H  . antiseptic oral rinse  7 mL Mouth Rinse QID  . chlorhexidine gluconate  15 mL Mouth Rinse BID  . enoxaparin (LOVENOX) injection  145 mg Subcutaneous Q12H  . fentaNYL (SUBLIMAZE) injection  50 mcg Intravenous Once  . furosemide  80 mg Intravenous TID  . insulin aspart  2-6 Units Subcutaneous 6 times per day  . [START ON 12/29/2015] levothyroxine  37.5 mcg Intravenous Daily  . methadone  5 mg Per Tube TID  . metoprolol tartrate  25 mg Oral BID  . pantoprazole (PROTONIX) IV  40 mg Intravenous QHS  . potassium chloride  40 mEq Per Tube Daily  . sertraline  200 mg Per Tube Daily  . sodium  chloride  1,000 mL Intravenous Once     ROS:                                                                                                                                       History obtained from the patient  General ROS: negative for - chills, fatigue, fever, night sweats, weight gain or weight loss Psychological ROS: negative for - behavioral disorder, hallucinations, memory difficulties, mood swings or suicidal ideation Ophthalmic ROS: negative for - blurry vision, double vision, eye pain or loss of vision ENT ROS: negative for - epistaxis, nasal discharge, oral lesions, sore throat, tinnitus or vertigo Allergy and Immunology ROS: negative for - hives or itchy/watery eyes Hematological and Lymphatic ROS: negative for - bleeding problems, bruising or swollen lymph nodes Endocrine ROS: negative for - galactorrhea, hair pattern changes, polydipsia/polyuria or temperature intolerance Respiratory ROS: negative for - cough, hemoptysis, shortness of breath or wheezing Cardiovascular ROS: negative for - chest pain, dyspnea on exertion, edema or irregular heartbeat Gastrointestinal ROS: negative for - abdominal pain, diarrhea, hematemesis, nausea/vomiting or stool incontinence Genito-Urinary ROS: negative for - dysuria, hematuria, incontinence or urinary frequency/urgency Musculoskeletal ROS: negative for - joint swelling or muscular weakness Neurological ROS: as noted in HPI Dermatological ROS: negative for rash and skin lesion changes   Blood pressure 116/71, pulse 103, temperature 98.4 F (36.9 C), temperature source Core (Comment), resp. rate 14, weight 144 kg (317 lb 7.4 oz), SpO2 92 %.   Neurologic Examination:  HEENT-  Normocephalic, no lesions, without obvious abnormality.  Normal external eye and conjunctiva.  Normal TM's bilaterally.  Normal auditory canals and external ears.  Normal external nose, mucus membranes and septum.  Normal pharynx. Cardiovascular- S1, S2 normal, pulses palpable throughout   Lungs- chest clear, no wheezing, rales, normal symmetric air entry, Heart exam - S1, S2 normal, no murmur, no gallop, rate regular Abdomen- normal findings: bowel sounds normal Extremities- no edema Lymph-no adenopathy palpable Musculoskeletal-no joint tenderness, deformity or swelling Skin-warm and dry, no hyperpigmentation, vitiligo, or suspicious lesions  Neurological Examination Mental Status: Alert, oriented, thought content appropriate.  Intubated but writes clearly. Understands my questions.   Able to follow 3 step commands without difficulty. Cranial Nerves: II: Discs flat bilaterally; Visual fields grossly normal, pupils equal, round, reactive to light and accommodation III,IV, VI: ptosis not present, extra-ocular motions intact bilaterally V,VII: smile symmetric, facial light touch sensation normal bilaterally VIII: hearing normal bilaterally IX,X: uvula rises symmetrically XI: bilateral shoulder shrug XII: midline tongue extension Motor: Right : Upper extremity   5/5    Left:     Upper extremity   5/5  Lower extremity   5/5     Lower extremity   5/5 Tone and bulk:normal tone throughout; no atrophy noted Sensory: Pinprick and light touch intact throughout, bilaterally Deep Tendon Reflexes: 1+ and symmetric throughout Plantars: Right: downgoing   Left: downgoing Cerebellar: normal finger-to-nose, and normal heel-to-shin test Gait: not tested      Lab Results: Basic Metabolic Panel:  Recent Labs Lab 12/25/15 1256 12/25/15 2120 12/26/15 0307 12/27/15 0232 12/28/15 0239  NA 140 142 143 145 144  K 5.7* 4.0 4.1 3.4* 3.3*  CL 104 103 105 105 97*  CO2 28 28 29 30  34*  GLUCOSE 157* 100* 126* 109* 112*  BUN 18 16 15 15 15   CREATININE 1.33* 1.14* 1.05* 1.24* 1.19*  CALCIUM 8.3* 8.2* 8.2* 8.1* 8.3*  MG  --   --   --  1.5* 2.2  PHOS  --   --    --   --  3.0    Liver Function Tests:  Recent Labs Lab 12/25/15 1256  AST 31  ALT 15  ALKPHOS 111  BILITOT 0.9  PROT 8.2*  ALBUMIN 3.1*   No results for input(s): LIPASE, AMYLASE in the last 168 hours. No results for input(s): AMMONIA in the last 168 hours.  CBC:  Recent Labs Lab 12/25/15 1130 12/25/15 2120 12/26/15 0307 12/27/15 0232 12/28/15 0239  WBC 14.4* 9.5 9.0 7.6 8.2  NEUTROABS 11.1*  --   --   --   --   HGB 12.2 11.0* 10.2* 10.7* 10.2*  HCT 43.2 39.1 35.7* 36.0 34.8*  MCV 89.4 87.7 85.0 82.9 83.5  PLT 304 183 180 150 188    Cardiac Enzymes: No results for input(s): CKTOTAL, CKMB, CKMBINDEX, TROPONINI in the last 168 hours.  Lipid Panel: No results for input(s): CHOL, TRIG, HDL, CHOLHDL, VLDL, LDLCALC in the last 168 hours.  CBG:  Recent Labs Lab 12/27/15 1646 12/27/15 2005 12/28/15 0042 12/28/15 0320 12/28/15 0834  GLUCAP 106* 106* 103* 100* 104*    Microbiology: Results for orders placed or performed during the hospital encounter of 12/25/15  Blood Culture (routine x 2)     Status: None (Preliminary result)   Collection Time: 12/25/15 11:45 AM  Result Value Ref Range Status   Specimen Description BLOOD LEFT ANTECUBITAL  Final   Special Requests IN PEDIATRIC BOTTLE [facility]  Final  Culture NO GROWTH 3 DAYS  Final   Report Status PENDING  Incomplete  Blood Culture (routine x 2)     Status: None (Preliminary result)   Collection Time: 12/25/15 11:50 AM  Result Value Ref Range Status   Specimen Description BLOOD RIGHT ANTECUBITAL  Final   Special Requests   Final    BOTTLES DRAWN AEROBIC AND ANAEROBIC 10CC AER 5CC ANA   Culture NO GROWTH 3 DAYS  Final   Report Status PENDING  Incomplete  Urine culture     Status: None   Collection Time: 12/25/15 12:09 PM  Result Value Ref Range Status   Specimen Description URINE, CLEAN CATCH  Final   Special Requests NONE  Final   Culture NO GROWTH 1 DAY  Final   Report Status 12/26/2015 FINAL  Final   MRSA PCR Screening     Status: None   Collection Time: 12/25/15  9:10 PM  Result Value Ref Range Status   MRSA by PCR NEGATIVE NEGATIVE Final    Comment:        The GeneXpert MRSA Assay (FDA approved for NASAL specimens only), is one component of a comprehensive MRSA colonization surveillance program. It is not intended to diagnose MRSA infection nor to guide or monitor treatment for MRSA infections.   Culture, respiratory (NON-Expectorated)     Status: None   Collection Time: 12/25/15  9:35 PM  Result Value Ref Range Status   Specimen Description TRACHEAL ASPIRATE  Final   Special Requests NONE  Final   Gram Stain   Final    FEW WBC PRESENT,BOTH PMN AND MONONUCLEAR NO SQUAMOUS EPITHELIAL CELLS SEEN FEW GRAM NEGATIVE RODS Performed at [facility]    Culture   Final    MODERATE STREPTOCOCCUS GROUP C Note: Beta hemolytic streptococci are predictably susceptible to penicillin and other beta lactams. Susceptibility testing not routinely performed. Performed at [facility]    Report Status 12/28/2015 FINAL  Final    Coagulation Studies: No results for input(s): LABPROT, INR in the last 72 hours.  Imaging: Dg Chest Port 1 View  12/27/2015  CLINICAL DATA:  Intubation, hypertension, CHF, diabetes mellitus, coronary artery disease history endometrial cancer, former smoker EXAM: PORTABLE CHEST 1 VIEW COMPARISON:  Portable exam 0623 hours compared to 12/25/2015 FINDINGS: Tip of endotracheal tube projects 1.8 cm above carina. Nasogastric tube extends into stomach. Enlargement of cardiac silhouette. Mediastinal contour stable. Diffuse pulmonary infiltrates again identified, greater at lung bases, could represent edema or infection, little changed. No gross pleural effusion or pneumothorax. Bones demineralized. IMPRESSION: Persistent pulmonary infiltrates question edema versus infection. Enlargement of cardiac silhouette. Electronically Signed   By: Lavonia Dana M.D.   On:  12/27/2015 08:00       Assessment and plan per attending neurologist  Etta Quill PA-C [facility] [phone]  12/28/2015, 12:05 PM   Assessment/Plan: 60 YO female with possible provoked seizure secondary to hypoxia.    Recommend: 1) MRI brain with contrast 2) EEG 3) Would not initiate AED at this time unless abnormal EEG or MRI

## 2015-12-28 NOTE — Consult Note (Signed)
Referring Physician:  SARAHI BORLAND is an 60 y.o. female.                       Chief Complaint: Atrial fibrillation  HPI: 60 yo morbidly obese woman with Hx OSA / OHS / RLS (followed VS), DM, HTN, chronic resp failure was admitted for acute respiratory failure and intubated. Now she is extubated and has developed atrial fibrillation with moderate ventricular response with IV diltiazem use. She  has a past medical history of OSA (obstructive sleep apnea); CHF (congestive heart failure) ([place]); Diabetes mellitus; Altered mental status; RLS (restless legs syndrome); Acute and chronic respiratory failure with hypercapnia ([place]); Hypoxemia; Endometrial cancer ([place]); Morbid obesity ([place]); Depression; Hypothyroidism; GERD (gastroesophageal reflux disease); Nephrolithiasis; Chronic low back pain; ADD (attention deficit disorder); Coronary artery disease; Complication of anesthesia; Hypertension; and Hypercapnia.  Past Medical History  Diagnosis Date  . OSA (obstructive sleep apnea)   . CHF (congestive heart failure) ([place])   . Diabetes mellitus   . Altered mental status     2nd to hypercapnea  . Acute and chronic respiratory failure with hypercapnia ([place])   . Hypoxemia   . Endometrial cancer ([place])   . Morbid obesity ([place])   . Depression   . Hypothyroidism   . GERD (gastroesophageal reflux disease)   . Nephrolithiasis   . Chronic low back pain   . ADD (attention deficit disorder)   . Restless leg syndrome   . Coronary artery disease   . Complication of anesthesia     " I AM SLOW MTO WAKE UP AT ITMES "  . Hypertension   . Hypercapnia       Past Surgical History  Procedure Laterality Date  . Back surgery      x2  . Shoulder surgery      x2  . Vaginal hysterectomy      x2  . Dental surgery    . Tonsillectomy    . Sp perc nephrostomy      Family History  Problem Relation Age of Onset  . Cancer Father    Social History:  reports that she quit smoking about 41 years ago. Her  smoking use included Cigarettes. She has a 1 pack-year smoking history. She has never used smokeless tobacco. She reports that she does not drink alcohol or use illicit drugs.  Allergies:  Allergies  Allergen Reactions  . Aspirin Other (See Comments)    Chest pain  . Eliquis [Apixaban]     Excessive bleeding  . Nsaids Other (See Comments)    Chest pain  . Other Other (See Comments)    "BP med" combined with plavix, caused syncope  . Plavix [Clopidogrel] Other (See Comments)    syncope  . Sudafed [Pseudoephedrine] Palpitations    Raised BP  . Erythromycin Nausea And Vomiting  . Sulfa Antibiotics Other (See Comments)    constipation  . Sulfamethoxazole-Trimethoprim Diarrhea  . Altace [Ramipril] Other (See Comments)    Severe fatigue  . Zofran [Ondansetron Hcl] Other (See Comments)    "I put me into a coma"    Medications Prior to Admission  Medication Sig Dispense Refill  . azithromycin (ZITHROMAX) 250 MG tablet Take 250-500 mg by mouth See admin instructions. Take 500 mg the first day. Take 250 mg by mouth for 4 days.    Marland Kitchen b complex vitamins tablet Take 1 tablet by mouth daily.     Marland Kitchen BIOTIN PO Take 1 tablet by mouth  2 (two) times daily.    . cetirizine (ZYRTEC) 10 MG tablet Take 10 mg by mouth at bedtime.    Marland Kitchen diltiazem (DILACOR XR) 240 MG 24 hr capsule Take 1 capsule (240 mg total) by mouth daily. 30 capsule 1  . furosemide (LASIX) 40 MG tablet Take 1 tablet (40 mg total) by mouth 2 (two) times daily. (Patient taking differently: Take 120 mg by mouth daily. ) 60 tablet 3  . levothyroxine (SYNTHROID, LEVOTHROID) 75 MCG tablet Take 75 mcg by mouth daily.      Marland Kitchen losartan (COZAAR) 100 MG tablet Take 100 mg by mouth daily.    . methadone (DOLOPHINE) 10 MG/5ML solution Take 70 mg by mouth every morning.     . methylphenidate 36 MG PO CR tablet Take 72 mg by mouth daily.    Marland Kitchen nystatin (MYCOSTATIN/NYSTOP) 100000 UNIT/GM POWD Apply 1 g topically 2 (two) times daily as needed (for rash).     . pantoprazole (PROTONIX) 40 MG tablet Take 40 mg by mouth daily.  6  . potassium chloride SA (K-DUR,KLOR-CON) 20 MEQ tablet Take 20 mEq by mouth 2 (two) times daily.    . prochlorperazine (COMPAZINE) 10 MG tablet Take 10 mg by mouth every 6 (six) hours as needed for nausea or vomiting.    . propranolol (INDERAL) 40 MG tablet Take 40 mg by mouth 3 (three) times daily. 1 in the morning 1/2 in the afternoon 1 in the evening    . sertraline (ZOLOFT) 100 MG tablet Take 200 mg by mouth at bedtime.     . clotrimazole-betamethasone (LOTRISONE) cream Apply 1 application topically 2 (two) times daily.      Results for orders placed or performed during the hospital encounter of 12/25/15 (from the past 48 hour(s))  Glucose, capillary     Status: Abnormal   Collection Time: 12/26/15  4:27 PM  Result Value Ref Range   Glucose-Capillary 154 (H) 65 - 99 mg/dL  Glucose, capillary     Status: Abnormal   Collection Time: 12/26/15  7:40 PM  Result Value Ref Range   Glucose-Capillary 134 (H) 65 - 99 mg/dL   Comment 1 Notify RN   Glucose, capillary     Status: Abnormal   Collection Time: 12/27/15 12:08 AM  Result Value Ref Range   Glucose-Capillary 117 (H) 65 - 99 mg/dL   Comment 1 Notify RN   Basic metabolic panel     Status: Abnormal   Collection Time: 12/27/15  2:32 AM  Result Value Ref Range   Sodium 145 135 - 145 mmol/L   Potassium 3.4 (L) 3.5 - 5.1 mmol/L    Comment: DELTA CHECK NOTED NO VISIBLE HEMOLYSIS    Chloride 105 101 - 111 mmol/L   CO2 30 22 - 32 mmol/L   Glucose, Bld 109 (H) 65 - 99 mg/dL   BUN 15 6 - 20 mg/dL   Creatinine, Ser 5.65 (H) 0.44 - 1.00 mg/dL   Calcium 8.1 (L) 8.9 - 10.3 mg/dL   GFR calc non Af Amer 46 (L) >60 mL/min   GFR calc Af Amer 53 (L) >60 mL/min    Comment: (NOTE) The eGFR has been calculated using the CKD EPI equation. This calculation has not been validated in all clinical situations. eGFR's persistently <60 mL/min signify possible Chronic  Kidney Disease.    Anion gap 10 5 - 15  Magnesium     Status: Abnormal   Collection Time: 12/27/15  2:32 AM  Result Value Ref  Range   Magnesium 1.5 (L) 1.7 - 2.4 mg/dL  CBC     Status: Abnormal   Collection Time: 12/27/15  2:32 AM  Result Value Ref Range   WBC 7.6 4.0 - 10.5 K/uL   RBC 4.34 3.87 - 5.11 MIL/uL   Hemoglobin 10.7 (L) 12.0 - 15.0 g/dL   HCT 36.0 36.0 - 46.0 %   MCV 82.9 78.0 - 100.0 fL   MCH 24.7 (L) 26.0 - 34.0 pg   MCHC 29.7 (L) 30.0 - 36.0 g/dL   RDW 16.6 (H) 11.5 - 15.5 %   Platelets 150 150 - 400 K/uL  HIV antibody     Status: None   Collection Time: 12/27/15  2:32 AM  Result Value Ref Range   HIV Screen 4th Generation wRfx Non Reactive Non Reactive    Comment: (NOTE) Performed At: [facility] [address] [place], [place] [id number] Lindon Romp MD CZ:[id number]   Glucose, capillary     Status: Abnormal   Collection Time: 12/27/15  4:07 AM  Result Value Ref Range   Glucose-Capillary 110 (H) 65 - 99 mg/dL   Comment 1 Notify RN   Glucose, capillary     Status: Abnormal   Collection Time: 12/27/15  8:32 AM  Result Value Ref Range   Glucose-Capillary 163 (H) 65 - 99 mg/dL  Glucose, capillary     Status: Abnormal   Collection Time: 12/27/15 11:50 AM  Result Value Ref Range   Glucose-Capillary 110 (H) 65 - 99 mg/dL  Glucose, capillary     Status: Abnormal   Collection Time: 12/27/15  4:46 PM  Result Value Ref Range   Glucose-Capillary 106 (H) 65 - 99 mg/dL  Glucose, capillary     Status: Abnormal   Collection Time: 12/27/15  8:05 PM  Result Value Ref Range   Glucose-Capillary 106 (H) 65 - 99 mg/dL  Glucose, capillary     Status: Abnormal   Collection Time: 12/28/15 12:42 AM  Result Value Ref Range   Glucose-Capillary 103 (H) 65 - 99 mg/dL  BMET in AM     Status: Abnormal   Collection Time: 12/28/15  2:39 AM  Result Value Ref Range   Sodium 144 135 - 145 mmol/L   Potassium 3.3 (L) 3.5 - 5.1 mmol/L   Chloride 97 (L) 101 - 111 mmol/L    CO2 34 (H) 22 - 32 mmol/L   Glucose, Bld 112 (H) 65 - 99 mg/dL   BUN 15 6 - 20 mg/dL   Creatinine, Ser 1.19 (H) 0.44 - 1.00 mg/dL   Calcium 8.3 (L) 8.9 - 10.3 mg/dL   GFR calc non Af Amer 48 (L) >60 mL/min   GFR calc Af Amer 56 (L) >60 mL/min    Comment: (NOTE) The eGFR has been calculated using the CKD EPI equation. This calculation has not been validated in all clinical situations. eGFR's persistently <60 mL/min signify possible Chronic Kidney Disease.    Anion gap 13 5 - 15  Magnesium in AM     Status: None   Collection Time: 12/28/15  2:39 AM  Result Value Ref Range   Magnesium 2.2 1.7 - 2.4 mg/dL  Phosphorus     Status: None   Collection Time: 12/28/15  2:39 AM  Result Value Ref Range   Phosphorus 3.0 2.5 - 4.6 mg/dL  CBC     Status: Abnormal   Collection Time: 12/28/15  2:39 AM  Result Value Ref Range   WBC 8.2 4.0 -  10.5 K/uL   RBC 4.17 3.87 - 5.11 MIL/uL   Hemoglobin 10.2 (L) 12.0 - 15.0 g/dL   HCT 34.8 (L) 36.0 - 46.0 %   MCV 83.5 78.0 - 100.0 fL   MCH 24.5 (L) 26.0 - 34.0 pg   MCHC 29.3 (L) 30.0 - 36.0 g/dL   RDW 16.8 (H) 11.5 - 15.5 %   Platelets 188 150 - 400 K/uL  Glucose, capillary     Status: Abnormal   Collection Time: 12/28/15  3:20 AM  Result Value Ref Range   Glucose-Capillary 100 (H) 65 - 99 mg/dL  Glucose, capillary     Status: Abnormal   Collection Time: 12/28/15  8:34 AM  Result Value Ref Range   Glucose-Capillary 104 (H) 65 - 99 mg/dL  Glucose, capillary     Status: Abnormal   Collection Time: 12/28/15 12:04 PM  Result Value Ref Range   Glucose-Capillary 119 (H) 65 - 99 mg/dL   Dg Chest Port 1 View  12/27/2015  CLINICAL DATA:  Intubation, hypertension, CHF, diabetes mellitus, coronary artery disease history endometrial cancer, former smoker EXAM: PORTABLE CHEST 1 VIEW COMPARISON:  Portable exam 0623 hours compared to 12/25/2015 FINDINGS: Tip of endotracheal tube projects 1.8 cm above carina. Nasogastric tube extends into stomach. Enlargement  of cardiac silhouette. Mediastinal contour stable. Diffuse pulmonary infiltrates again identified, greater at lung bases, could represent edema or infection, little changed. No gross pleural effusion or pneumothorax. Bones demineralized. IMPRESSION: Persistent pulmonary infiltrates question edema versus infection. Enlargement of cardiac silhouette. Electronically Signed   By: Lavonia Dana M.D.   On: 12/27/2015 08:00    Review Of Systems Constitutional: Positive for malaise/fatigue. Negative for fever and chills.  HENT: Negative for hearing loss.  Eyes: Negative for double vision and photophobia.  Respiratory: Negative for cough, hemoptysis, sputum production and shortness of breath.  Cardiovascular: Negative for chest pain, palpitations and orthopnea.  Gastrointestinal: Positive for nausea and vomiting. Negative for abdominal pain.  Genitourinary: Negative for dysuria.  Neurological: Positive for dizziness and weakness. Negative for headaches.  Blood pressure 122/92, pulse 99, temperature 98.6 F (37 C), temperature source Core (Comment), resp. rate 14, weight 144 kg (317 lb 7.4 oz), SpO2 94 %.  Physical Exam  Constitutional: She appears well-developed and well-nourished. Mild to moderate respiratory distress. HENT: Head: Normocephalic and atraumatic. Right Ear: External ear normal. Left Ear: External ear normal.  Eyes: Conjunctivae and EOM are normal. Pupils are equal, round, and reactive to light.  Neck: Normal range of motion. Neck supple. No JVD at 90 degree angle. Cardiovascular: Normal rate, regular rhythm, normal heart sounds and intact distal pulses.II/VI systolic murmur  Pulmonary/Chest: Bilateral basal crackles.  Abdominal: Soft. Bowel sounds are normal.   Musculoskeletal: Normal range of motion. 2 + lower leg edema. Neurological: She is alert and oriented to person, place, and time. Moves all 4 extremities. Skin: Skin is warm and dry.  Assessment/Plan Paroxysmal  atrial fibrillation Acute on chronic respiratory failure CAD Hypertension Acute on chronic diastolic heart failure DM, II Morbid obesity Obstructive sleep apnea/obesity hypoventilation syndrome on CPAP  Chronic hypoxic/hypercarbic respiratory failure  COPD  Remote tobacco abuse  Degenerative joint disease  Hypothyroidism   Restless leg syndrome  Agree with diltiazem drip and diuresis Echocardiogram. Add Lanoxin and B-blocker as needed for heart rate control. May try Xarelto as has intolerance to aspirin, Plavix and Eliquis in past.  Birdie Riddle, MD  12/28/2015, 1:43 PM

## 2015-12-28 NOTE — Progress Notes (Signed)
ANTICOAGULATION CONSULT NOTE - Initial Consult  Pharmacy Consult for Lovenox Indication: atrial fibrillation  Allergies  Allergen Reactions  . Aspirin Other (See Comments)    Chest pain  . Eliquis [Apixaban]     Excessive bleeding  . Nsaids Other (See Comments)    Chest pain  . Other Other (See Comments)    "BP med" combined with plavix, caused syncope  . Plavix [Clopidogrel] Other (See Comments)    syncope  . Sudafed [Pseudoephedrine] Palpitations    Raised BP  . Erythromycin Nausea And Vomiting  . Sulfa Antibiotics Other (See Comments)    constipation  . Sulfamethoxazole-Trimethoprim Diarrhea  . Altace [Ramipril] Other (See Comments)    Severe fatigue  . Zofran [Ondansetron Hcl] Other (See Comments)    "I put me into a coma"    Patient Measurements: Weight: (!) 317 lb 7.4 oz (144 kg)  Vital Signs: Temp: 98.4 F (36.9 C) (01/17 1000) BP: 116/71 mmHg (01/17 1027) Pulse Rate: 103 (01/17 1027)  Labs:  Recent Labs  12/26/15 0307 12/27/15 0232 12/28/15 0239  HGB 10.2* 10.7* 10.2*  HCT 35.7* 36.0 34.8*  PLT 180 150 188  CREATININE 1.05* 1.24* 1.19*    Estimated Creatinine Clearance: 71.9 mL/min (by C-G formula based on Cr of 1.19).   Medical History: Past Medical History  Diagnosis Date  . OSA (obstructive sleep apnea)   . CHF (congestive heart failure) ([place])   . Diabetes mellitus   . Altered mental status     2nd to hypercapnea  . Acute and chronic respiratory failure with hypercapnia ([place])   . Hypoxemia   . Endometrial cancer ([place])   . Morbid obesity ([place])   . Depression   . Hypothyroidism   . GERD (gastroesophageal reflux disease)   . Nephrolithiasis   . Chronic low back pain   . ADD (attention deficit disorder)   . Restless leg syndrome   . Coronary artery disease   . Complication of anesthesia     " I AM SLOW MTO WAKE UP AT ITMES "  . Hypertension   . Hypercapnia    Assessment: 60 yo F admitted 12/25/2015 after being found unresponsive.  Patient with history of Afib back in May 2015, not on anticoagulation prior to admission. New onset Afib this admission. Pharmacy consulted to start Lovenox.   H/H stable. Plt 188. No s/sx of bleeding noted.  Goal of Therapy:  Heparin level 0.6 - 1 units/ml Monitor platelets by anticoagulation protocol: Yes   Plan:  - Lovenox 145 mg (1 mg/kg) SQ q12h - Monitor anti-Xa level tomorrow evening @ 1930 - Monitor CBC and s/sx of bleeding  Dimitri Ped, PharmD. PGY-1 Pharmacy Resident Pager: [phone]  12/28/2015,11:30 AM

## 2015-12-28 NOTE — Progress Notes (Signed)
EEG Completed; Results Pending  

## 2015-12-28 NOTE — Consult Note (Signed)
NEURO HOSPITALIST CONSULT NOTE   Requestig physician: Dr. Chase Caller   Reason for Consult: possible seizure  HPI:                                                                                                                                          [name] is an 60 y.o. female Hx OSA / OHS / RLS (followed VS), DM, HTN, chronic resp failure. Has been treated recently as outpt for possible bronchitis. She was found by her daughter unresponsive am 1/14. On EMS arrival SpO2 ~ 50's%, improved with O2. On arrival she was showing rhythmic motions thought to be seizure. The movements slowed down with ativan per chart. She was intubated in ED.  She was loaded with Keppra 1 Gram in ED but this was not continued. While in hospital no further seizure like events was noted. While intubated she did have episodes of elevated HR. At present time she is not having any seizure activity.  Patient is intubated but awake.  She writes that she has no seizure history but has had LE twitching--of note: she has RLS.   Past Medical History  Diagnosis Date  . OSA (obstructive sleep apnea)   . CHF (congestive heart failure) ([place])   . Diabetes mellitus   . Altered mental status     2nd to hypercapnea  . Acute and chronic respiratory failure with hypercapnia ([place])   . Hypoxemia   . Endometrial cancer ([place])   . Morbid obesity ([place])   . Depression   . Hypothyroidism   . GERD (gastroesophageal reflux disease)   . Nephrolithiasis   . Chronic low back pain   . ADD (attention deficit disorder)   . Restless leg syndrome   . Coronary artery disease   . Complication of anesthesia     " I AM SLOW MTO WAKE UP AT ITMES "  . Hypertension   . Hypercapnia     Past Surgical History  Procedure Laterality Date  . Back surgery      x2  . Shoulder surgery      x2  . Vaginal hysterectomy      x2  . Dental surgery    . Tonsillectomy    . Sp perc nephrostomy      Family History  Problem  Relation Age of Onset  . Cancer Father      Social History:  reports that she quit smoking about 41 years ago. Her smoking use included Cigarettes. She has a 1 pack-year smoking history. She has never used smokeless tobacco. She reports that she does not drink alcohol or use illicit drugs.  Allergies  Allergen Reactions  . Aspirin Other (See Comments)    Chest pain  . Eliquis [Apixaban]  Excessive bleeding  . Nsaids Other (See Comments)    Chest pain  . Other Other (See Comments)    "BP med" combined with plavix, caused syncope  . Plavix [Clopidogrel] Other (See Comments)    syncope  . Sudafed [Pseudoephedrine] Palpitations    Raised BP  . Erythromycin Nausea And Vomiting  . Sulfa Antibiotics Other (See Comments)    constipation  . Sulfamethoxazole-Trimethoprim Diarrhea  . Altace [Ramipril] Other (See Comments)    Severe fatigue  . Zofran [Ondansetron Hcl] Other (See Comments)    "I put me into a coma"    MEDICATIONS:                                                                                                                     Prior to Admission:  Prescriptions prior to admission  Medication Sig Dispense Refill Last Dose  . azithromycin (ZITHROMAX) 250 MG tablet Take 250-500 mg by mouth See admin instructions. Take 500 mg the first day. Take 250 mg by mouth for 4 days.   12/24/2015 at Unknown time  . b complex vitamins tablet Take 1 tablet by mouth daily.    12/24/2015 at Unknown time  . BIOTIN PO Take 1 tablet by mouth 2 (two) times daily.   12/24/2015 at Unknown time  . cetirizine (ZYRTEC) 10 MG tablet Take 10 mg by mouth at bedtime.   12/24/2015 at Unknown time  . diltiazem (DILACOR XR) 240 MG 24 hr capsule Take 1 capsule (240 mg total) by mouth daily. 30 capsule 1 12/24/2015 at Unknown time  . furosemide (LASIX) 40 MG tablet Take 1 tablet (40 mg total) by mouth 2 (two) times daily. (Patient taking differently: Take 120 mg by mouth daily. ) 60 tablet 3 12/24/2015 at  Unknown time  . levothyroxine (SYNTHROID, LEVOTHROID) 75 MCG tablet Take 75 mcg by mouth daily.     12/24/2015 at Unknown time  . losartan (COZAAR) 100 MG tablet Take 100 mg by mouth daily.   12/24/2015 at Unknown time  . methadone (DOLOPHINE) 10 MG/5ML solution Take 70 mg by mouth every morning.    12/24/2015 at Unknown time  . methylphenidate 36 MG PO CR tablet Take 72 mg by mouth daily.   12/24/2015 at Unknown time  . nystatin (MYCOSTATIN/NYSTOP) 100000 UNIT/GM POWD Apply 1 g topically 2 (two) times daily as needed (for rash).   unknown  . pantoprazole (PROTONIX) 40 MG tablet Take 40 mg by mouth daily.  6 12/24/2015 at Unknown time  . potassium chloride SA (K-DUR,KLOR-CON) 20 MEQ tablet Take 20 mEq by mouth 2 (two) times daily.   12/24/2015 at Unknown time  . prochlorperazine (COMPAZINE) 10 MG tablet Take 10 mg by mouth every 6 (six) hours as needed for nausea or vomiting.   unknown  . propranolol (INDERAL) 40 MG tablet Take 40 mg by mouth 3 (three) times daily. 1 in the morning 1/2 in the afternoon 1 in the evening   12/24/2015 at 1800  . sertraline (ZOLOFT)  100 MG tablet Take 200 mg by mouth at bedtime.    12/24/2015 at Unknown time  . clotrimazole-betamethasone (LOTRISONE) cream Apply 1 application topically 2 (two) times daily.   Taking   Scheduled: . ampicillin-sulbactam (UNASYN) IV  1.5 g Intravenous Q6H  . antiseptic oral rinse  7 mL Mouth Rinse QID  . chlorhexidine gluconate  15 mL Mouth Rinse BID  . enoxaparin (LOVENOX) injection  145 mg Subcutaneous Q12H  . fentaNYL (SUBLIMAZE) injection  50 mcg Intravenous Once  . furosemide  80 mg Intravenous TID  . insulin aspart  2-6 Units Subcutaneous 6 times per day  . [START ON 12/29/2015] levothyroxine  37.5 mcg Intravenous Daily  . methadone  5 mg Per Tube TID  . metoprolol tartrate  25 mg Oral BID  . pantoprazole (PROTONIX) IV  40 mg Intravenous QHS  . potassium chloride  40 mEq Per Tube Daily  . sertraline  200 mg Per Tube Daily  . sodium  chloride  1,000 mL Intravenous Once     ROS:                                                                                                                                       History obtained from the patient  General ROS: negative for - chills, fatigue, fever, night sweats, weight gain or weight loss Psychological ROS: negative for - behavioral disorder, hallucinations, memory difficulties, mood swings or suicidal ideation Ophthalmic ROS: negative for - blurry vision, double vision, eye pain or loss of vision ENT ROS: negative for - epistaxis, nasal discharge, oral lesions, sore throat, tinnitus or vertigo Allergy and Immunology ROS: negative for - hives or itchy/watery eyes Hematological and Lymphatic ROS: negative for - bleeding problems, bruising or swollen lymph nodes Endocrine ROS: negative for - galactorrhea, hair pattern changes, polydipsia/polyuria or temperature intolerance Respiratory ROS: negative for - cough, hemoptysis, shortness of breath or wheezing Cardiovascular ROS: negative for - chest pain, dyspnea on exertion, edema or irregular heartbeat Gastrointestinal ROS: negative for - abdominal pain, diarrhea, hematemesis, nausea/vomiting or stool incontinence Genito-Urinary ROS: negative for - dysuria, hematuria, incontinence or urinary frequency/urgency Musculoskeletal ROS: negative for - joint swelling or muscular weakness Neurological ROS: as noted in HPI Dermatological ROS: negative for rash and skin lesion changes   Blood pressure 116/71, pulse 103, temperature 98.4 F (36.9 C), temperature source Core (Comment), resp. rate 14, weight 144 kg (317 lb 7.4 oz), SpO2 92 %.   Neurologic Examination:  HEENT-  Normocephalic, no lesions, without obvious abnormality.  Normal external eye and conjunctiva.  Normal TM's bilaterally.  Normal auditory canals and external ears.  Normal external nose, mucus membranes and septum.  Normal pharynx. Cardiovascular- S1, S2 normal, pulses palpable throughout   Lungs- chest clear, no wheezing, rales, normal symmetric air entry, Heart exam - S1, S2 normal, no murmur, no gallop, rate regular Abdomen- normal findings: bowel sounds normal Extremities- no edema Lymph-no adenopathy palpable Musculoskeletal-no joint tenderness, deformity or swelling Skin-warm and dry, no hyperpigmentation, vitiligo, or suspicious lesions  Neurological Examination Mental Status: Alert, oriented, thought content appropriate.  Intubated but writes clearly. Understands my questions.   Able to follow 3 step commands without difficulty. Cranial Nerves: II: Discs flat bilaterally; Visual fields grossly normal, pupils equal, round, reactive to light and accommodation III,IV, VI: ptosis not present, extra-ocular motions intact bilaterally V,VII: smile symmetric, facial light touch sensation normal bilaterally VIII: hearing normal bilaterally IX,X: uvula rises symmetrically XI: bilateral shoulder shrug XII: midline tongue extension Motor: Right : Upper extremity   5/5    Left:     Upper extremity   5/5  Lower extremity   5/5     Lower extremity   5/5 Tone and bulk:normal tone throughout; no atrophy noted Sensory: Pinprick and light touch intact throughout, bilaterally Deep Tendon Reflexes: 1+ and symmetric throughout Plantars: Right: downgoing   Left: downgoing Cerebellar: normal finger-to-nose, and normal heel-to-shin test Gait: not tested      Lab Results: Basic Metabolic Panel:  Recent Labs Lab 12/25/15 1256 12/25/15 2120 12/26/15 0307 12/27/15 0232 12/28/15 0239  NA 140 142 143 145 144  K 5.7* 4.0 4.1 3.4* 3.3*  CL 104 103 105 105 97*  CO2 28 28 29 30  34*  GLUCOSE 157* 100* 126* 109* 112*  BUN 18 16 15 15 15   CREATININE 1.33* 1.14* 1.05* 1.24* 1.19*  CALCIUM 8.3* 8.2* 8.2* 8.1* 8.3*  MG  --   --   --  1.5* 2.2  PHOS  --   --    --   --  3.0    Liver Function Tests:  Recent Labs Lab 12/25/15 1256  AST 31  ALT 15  ALKPHOS 111  BILITOT 0.9  PROT 8.2*  ALBUMIN 3.1*   No results for input(s): LIPASE, AMYLASE in the last 168 hours. No results for input(s): AMMONIA in the last 168 hours.  CBC:  Recent Labs Lab 12/25/15 1130 12/25/15 2120 12/26/15 0307 12/27/15 0232 12/28/15 0239  WBC 14.4* 9.5 9.0 7.6 8.2  NEUTROABS 11.1*  --   --   --   --   HGB 12.2 11.0* 10.2* 10.7* 10.2*  HCT 43.2 39.1 35.7* 36.0 34.8*  MCV 89.4 87.7 85.0 82.9 83.5  PLT 304 183 180 150 188    Cardiac Enzymes: No results for input(s): CKTOTAL, CKMB, CKMBINDEX, TROPONINI in the last 168 hours.  Lipid Panel: No results for input(s): CHOL, TRIG, HDL, CHOLHDL, VLDL, LDLCALC in the last 168 hours.  CBG:  Recent Labs Lab 12/27/15 1646 12/27/15 2005 12/28/15 0042 12/28/15 0320 12/28/15 0834  GLUCAP 106* 106* 103* 100* 104*    Microbiology: Results for orders placed or performed during the hospital encounter of 12/25/15  Blood Culture (routine x 2)     Status: None (Preliminary result)   Collection Time: 12/25/15 11:45 AM  Result Value Ref Range Status   Specimen Description BLOOD LEFT ANTECUBITAL  Final   Special Requests IN PEDIATRIC BOTTLE [facility]  Final  Culture NO GROWTH 3 DAYS  Final   Report Status PENDING  Incomplete  Blood Culture (routine x 2)     Status: None (Preliminary result)   Collection Time: 12/25/15 11:50 AM  Result Value Ref Range Status   Specimen Description BLOOD RIGHT ANTECUBITAL  Final   Special Requests   Final    BOTTLES DRAWN AEROBIC AND ANAEROBIC 10CC AER 5CC ANA   Culture NO GROWTH 3 DAYS  Final   Report Status PENDING  Incomplete  Urine culture     Status: None   Collection Time: 12/25/15 12:09 PM  Result Value Ref Range Status   Specimen Description URINE, CLEAN CATCH  Final   Special Requests NONE  Final   Culture NO GROWTH 1 DAY  Final   Report Status 12/26/2015 FINAL  Final   MRSA PCR Screening     Status: None   Collection Time: 12/25/15  9:10 PM  Result Value Ref Range Status   MRSA by PCR NEGATIVE NEGATIVE Final    Comment:        The GeneXpert MRSA Assay (FDA approved for NASAL specimens only), is one component of a comprehensive MRSA colonization surveillance program. It is not intended to diagnose MRSA infection nor to guide or monitor treatment for MRSA infections.   Culture, respiratory (NON-Expectorated)     Status: None   Collection Time: 12/25/15  9:35 PM  Result Value Ref Range Status   Specimen Description TRACHEAL ASPIRATE  Final   Special Requests NONE  Final   Gram Stain   Final    FEW WBC PRESENT,BOTH PMN AND MONONUCLEAR NO SQUAMOUS EPITHELIAL CELLS SEEN FEW GRAM NEGATIVE RODS Performed at [facility]    Culture   Final    MODERATE STREPTOCOCCUS GROUP C Note: Beta hemolytic streptococci are predictably susceptible to penicillin and other beta lactams. Susceptibility testing not routinely performed. Performed at [facility]    Report Status 12/28/2015 FINAL  Final    Coagulation Studies: No results for input(s): LABPROT, INR in the last 72 hours.  Imaging: Dg Chest Port 1 View  12/27/2015  CLINICAL DATA:  Intubation, hypertension, CHF, diabetes mellitus, coronary artery disease history endometrial cancer, former smoker EXAM: PORTABLE CHEST 1 VIEW COMPARISON:  Portable exam 0623 hours compared to 12/25/2015 FINDINGS: Tip of endotracheal tube projects 1.8 cm above carina. Nasogastric tube extends into stomach. Enlargement of cardiac silhouette. Mediastinal contour stable. Diffuse pulmonary infiltrates again identified, greater at lung bases, could represent edema or infection, little changed. No gross pleural effusion or pneumothorax. Bones demineralized. IMPRESSION: Persistent pulmonary infiltrates question edema versus infection. Enlargement of cardiac silhouette. Electronically Signed   By: Lavonia Dana M.D.   On:  12/27/2015 08:00       Assessment and plan per attending neurologist  Etta Quill PA-C [facility] [phone]  12/28/2015, 12:05 PM   Assessment/Plan: 60 YO female with possible provoked seizure secondary to hypoxia.    Recommend: 1) MRI brain with contrast 2) EEG 3) Would not initiate AED at this time unless abnormal EEG or MRI Patient seen and examined together with physician assistant and I concur with the assessment and plan.  Dorian Pod, MD

## 2015-12-28 NOTE — Procedures (Signed)
History: 60 year old female who presented with possible seizure activity in the setting of unresponsiveness.  Sedation: Modena Jansky, fentanyl  Technique: This is a 21 channel routine scalp EEG performed at the bedside with bipolar and monopolar montages arranged in accordance to the international 10/20 system of electrode placement. One channel was dedicated to EKG recording.    Background: There is a posterior dominant rhythm of 8 Hz. There is also generalized irregular theta and delta activity throughout the recording. Of note, there is a prominent wicket pattern as well as prominent lateral rectus spikes, but no clear epileptiform discharges.  Photic stimulation: Physiologic driving is not performed  EEG Abnormalities: 1) generalized irregular slow activity  Clinical Interpretation: This EEG is consistent with a generalized non-specific cerebral dysfunction(encephalopathy). There was no seizure or seizure predisposition recorded on this study.   Roland Rack, MD [facility] [phone]  If 7pm- 7am, please page neurology on call as listed in [place].

## 2015-12-28 NOTE — Procedures (Signed)
Extubation Procedure Note  Patient Details:   Name: [name] DOB: Jul 08, 1956 MRN: [id number]   Airway Documentation:     Evaluation  O2 sats: stable throughout Complications: No apparent complications Patient did tolerate procedure well. Bilateral Breath Sounds: Diminished, Clear Suctioning: Airway Yes   Patient extubated to BiPAP per MD order. 12/6 and 40%. Patient able to vocalize and clear secretions. RT to monitor as needed  Saunders Glance 12/28/2015, 12:32 PM

## 2015-12-28 NOTE — Progress Notes (Signed)
PULMONARY / CRITICAL CARE MEDICINE   Name: [name] MRN: [id number] DOB: [birth year]    ADMISSION DATE:  12/25/2015 CONSULTATION DATE:  12/24/14  BRIEF PATIENT DESCRIPTION: 60 yo F found unresponsive at home found to be hypoxic and had possible seizure activity requiring intubation.   SIGNIFICANT EVENTS / STUDIES:  1/14 Found unresponsive with hypoxia and possible seizure activity requiring intubation and ativan/keppra 1/14 CT head with no acute findings  1/15 Afib requiring diltiazem drip   LINES / TUBES: 1/14 ETT >    CULTURES: 1/14 MRSA neg 1/14 Blood >  1/14 Urine > negative 1/14 Resp >    ANTIBIOTICS:  1/14 Unasyn > 1/14 Cefepime >1/15 1/14 Vanc >1/15  SUBJECTIVE: Pt febrile overnight to 100.4. No seizure activity overnight. Tachycardia improved but still in afib.  VITAL SIGNS: Temp:  [98.8 F (37.1 C)-100.4 F (38 C)] 98.8 F (37.1 C) (01/17 0600) Pulse Rate:  [59-136] 97 (01/17 0600) Resp:  [16-38] 20 (01/17 0600) BP: (94-141)/(57-98) 103/76 mmHg (01/17 0600) SpO2:  [88 %-100 %] 96 % (01/17 0600) FiO2 (%):  [40 %] 40 % (01/17 0600) Weight:  [317 lb 7.4 oz (144 kg)] 317 lb 7.4 oz (144 kg) (01/17 0414) HEMODYNAMICS:   VENTILATOR SETTINGS: Vent Mode:  [-] PRVC FiO2 (%):  [40 %] 40 % Set Rate:  [18 bmp] 18 bmp Vt Set:  [450 mL] 450 mL PEEP:  [5 cmH20] 5 cmH20 Pressure Support:  [8 cmH20-10 cmH20] 8 cmH20 Plateau Pressure:  [26 cmH20-29 cmH20] 27 cmH20 INTAKE / OUTPUT: Intake/Output      01/16 0701 - 01/17 0700 01/17 0701 - 01/18 0700   I.V. (mL/kg) 1153.5 (8)    NG/GT 120    IV Piggyback 300    Total Intake(mL/kg) 1573.5 (10.9)    Urine (mL/kg/hr) 3900 (1.1)    Total Output 3900     Net -2326.5            PHYSICAL EXAMINATION: General: NAD Neuro: Alert and follows commands  HEENT:  Intubated Cardiovascular: Normal rate with irregularly irregular rhythm  Lungs: Intermittent ronchi in anterior fields Abdomen: Obese, soft, non-tender,  non-distended, normal BS Musculoskeletal: Trace b/l pitting edema  Skin: No rashes  LABS:  CBC  Recent Labs Lab 12/26/15 0307 12/27/15 0232 12/28/15 0239  WBC 9.0 7.6 8.2  HGB 10.2* 10.7* 10.2*  HCT 35.7* 36.0 34.8*  PLT 180 150 188   Coag's No results for input(s): APTT, INR in the last 168 hours. BMET  Recent Labs Lab 12/26/15 0307 12/27/15 0232 12/28/15 0239  NA 143 145 144  K 4.1 3.4* 3.3*  CL 105 105 97*  CO2 29 30 34*  BUN 15 15 15   CREATININE 1.05* 1.24* 1.19*  GLUCOSE 126* 109* 112*   Electrolytes  Recent Labs Lab 12/26/15 0307 12/27/15 0232 12/28/15 0239  CALCIUM 8.2* 8.1* 8.3*  MG  --  1.5* 2.2  PHOS  --   --  3.0   Sepsis Markers  Recent Labs Lab 12/25/15 1202 12/25/15 1830 12/25/15 2120 12/26/15 0307  LATICACIDVEN 1.62 2.15*  --   --   PROCALCITON  --   --  <0.10 <0.10   ABG  Recent Labs Lab 12/25/15 1308 12/25/15 2201 12/26/15 0345  PHART 7.133* 7.380 7.349*  PCO2ART 99.8* 49.0* 55.4*  PO2ART 132.0* 55.0* 66.5*   Liver Enzymes  Recent Labs Lab 12/25/15 1256  AST 31  ALT 15  ALKPHOS 111  BILITOT 0.9  ALBUMIN 3.1*   Cardiac Enzymes  No results for input(s): TROPONINI, PROBNP in the last 168 hours. Glucose  Recent Labs Lab 12/27/15 0832 12/27/15 1150 12/27/15 1646 12/27/15 2005 12/28/15 0042 12/28/15 0320  GLUCAP 163* 110* 106* 106* 103* 100*    Imaging Dg Chest Port 1 View  12/27/2015  CLINICAL DATA:  Intubation, hypertension, CHF, diabetes mellitus, coronary artery disease history endometrial cancer, former smoker EXAM: PORTABLE CHEST 1 VIEW COMPARISON:  Portable exam 0623 hours compared to 12/25/2015 FINDINGS: Tip of endotracheal tube projects 1.8 cm above carina. Nasogastric tube extends into stomach. Enlargement of cardiac silhouette. Mediastinal contour stable. Diffuse pulmonary infiltrates again identified, greater at lung bases, could represent edema or infection, little changed. No gross pleural effusion  or pneumothorax. Bones demineralized. IMPRESSION: Persistent pulmonary infiltrates question edema versus infection. Enlargement of cardiac silhouette. Electronically Signed   By: Lavonia Dana M.D.   On: 12/27/2015 08:00     ASSESSMENT / PLAN:   PULMONARY A: Acute on chronic hypercapnic respiratory failure requiring mechanical ventilation OSA / OHS Aspiration pneumonia  History of tobacco use P:  Full vent settings, wean FIO2/PEEP to SpO2> 92% SBT today, hopeful for extubation soon Albuterol PRN  Continue IV unasyn  CPAP or BiPAP at night mandatory   CARDIOVASCULAR A:  Recurrent  afib (had previously on 04/2014)  - CHADS2VASC of 3 Acute on chronic CHF - last EF 55-60%, BNP elevated at 370 Moderate mitral regurgation  Hypertension CAD P:  Wean diltiazem drip Continue lopressor 25 mg BID  Consider starting IV heparin for AC  Continue IV lasix 40 mg daily  Hold home losartan and propanolol   RENAL A: Non-oliguric AKI (baseline Cr 0.9-1) - resolved Hypokalemia - in setting of diuresis  Hypomagnesemia - resolved P:  Monitor BMP  Replete Mg to keep >2 Replete K to keep >4 Monitor UOP  GASTROINTESTINAL A:  Nutrition SUP  GERD Morbid obesity P:  Continue tube feeds  Continue protonix 40 mg daily   HEMATOLOGIC/ONCOLOGICAL  A: Chronic normocytic anemia - Hg stable  Leukocytosis - resolved  History of endometrial cancer DVT Ppx P:  Monitor CBC SQ heparin TID  INFECTIOUS A: Aspiration PNA - PCT <0.10 P:  Continue unasyn Day 4/7 Follow-up cultures    ENDOCRINE A:  Hypoglycemia - resolved  Non-insulin dependent Type 2 DM - last A1c 6.5 on 11/09/14  Hypothyroidism  P:  SSI insulin per ICU protocol Continue IV synthroid 50 mcg daily     NEUROLOGIC A:  Questionable seizure activity, most likely secondary due to hypoxia s/p keppra load on admission Hypercapnic encephalopathy - normal CT head  Methadone dependence  Adult  ADHD Depression  P:  RASS goal: 0 to -1 Wean fentanyl infusion  Fentanyl and versed PRN agitation   Hold home methadone and ritalin Continue home zoloft 200 mg daily   Jesusita Jocelyn  PGY-3 IMTS Pager [phone]

## 2015-12-28 NOTE — Progress Notes (Signed)
[facility] NOTE  Pharmacy to start Xarelto Indication: atrial fibrillation  Allergies  Allergen Reactions  . Aspirin Other (See Comments)    Chest pain  . Eliquis [Apixaban]     Excessive bleeding  . Nsaids Other (See Comments)    Chest pain  . Other Other (See Comments)    "BP med" combined with plavix, caused syncope  . Plavix [Clopidogrel] Other (See Comments)    syncope  . Sudafed [Pseudoephedrine] Palpitations    Raised BP  . Erythromycin Nausea And Vomiting  . Sulfa Antibiotics Other (See Comments)    constipation  . Sulfamethoxazole-Trimethoprim Diarrhea  . Altace [Ramipril] Other (See Comments)    Severe fatigue  . Zofran [Ondansetron Hcl] Other (See Comments)    "I put me into a coma"    Patient Measurements: Weight: (!) 317 lb 7.4 oz (144 kg)  Vital Signs: Temp: 99 F (37.2 C) (01/17 1320) BP: 118/87 mmHg (01/17 1320) Pulse Rate: 105 (01/17 1320)  Labs:  Recent Labs  12/26/15 0307 12/27/15 0232 12/28/15 0239  HGB 10.2* 10.7* 10.2*  HCT 35.7* 36.0 34.8*  PLT 180 150 188  CREATININE 1.05* 1.24* 1.19*    Estimated Creatinine Clearance: 71.9 mL/min (by C-G formula based on Cr of 1.19).   Medical History: Past Medical History  Diagnosis Date  . OSA (obstructive sleep apnea)   . CHF (congestive heart failure) ([place])   . Diabetes mellitus   . Altered mental status     2nd to hypercapnea  . Acute and chronic respiratory failure with hypercapnia ([place])   . Hypoxemia   . Endometrial cancer ([place])   . Morbid obesity ([place])   . Depression   . Hypothyroidism   . GERD (gastroesophageal reflux disease)   . Nephrolithiasis   . Chronic low back pain   . ADD (attention deficit disorder)   . Restless leg syndrome   . Coronary artery disease   . Complication of anesthesia     " I AM SLOW MTO WAKE UP AT ITMES "  . Hypertension   . Hypercapnia   . Paroxysmal atrial fibrillation [facility])      Assessment: 60 yo F admitted 12/25/2015 after  being found unresponsive. Patient with history of Afib back in May 2015, not on anticoagulation prior to admission. New onset Afib this admission. Pharmacy initially consulted to start Lovenox now changing to Xarelto. Per patient, she has been on Eliquis in the past but discontinued it shortly after starting due to bleeding ("blood in toilet"). Patient states bleeding did not result in hospital admission but was discontinued per her cardiologist.  Hgb  10.2, Plt wnl. No s/sx of bleeding  Goal of Therapy:  Monitor platelets by anticoagulation protocol: Yes   Plan:  - Stop Lovenox - Start Xarelto 20 mg PO daily, will give first dose tonight at 2200 - Monitor CBC, renal function and s/sx of bleeding closely  Dimitri Ped, PharmD. PGY-1 Pharmacy Resident Pager: [phone] 12/28/2015,2:35 PM

## 2015-12-28 NOTE — Progress Notes (Signed)
[facility] ADULT ICU REPLACEMENT PROTOCOL FOR AM LAB REPLACEMENT ONLY  The patient does apply for the [facility] Adult ICU Electrolyte Replacment Protocol based on the criteria listed below:   1. Is GFR >/= 40 ml/min? Yes.    Patient's GFR today is 48 2. Is urine output >/= 0.5 ml/kg/hr for the last 6 hours? Yes.   Patient's UOP is 0.5 ml/kg/hr 3. Is BUN < 60 mg/dL? Yes.    Patient's BUN today is 15 4. Abnormal electrolyte(s): K 3.3 5. Ordered repletion with: per protocol 6. If a panic level lab has been reported, has the CCM MD in charge been notified? No..   Physician:    Ronda Fairly A 12/28/2015 6:07 AM

## 2015-12-29 ENCOUNTER — Inpatient Hospital Stay (HOSPITAL_COMMUNITY): Payer: Medicare Other

## 2015-12-29 ENCOUNTER — Encounter (HOSPITAL_COMMUNITY): Payer: Self-pay

## 2015-12-29 DIAGNOSIS — R569 Unspecified convulsions: Secondary | ICD-10-CM | POA: Insufficient documentation

## 2015-12-29 DIAGNOSIS — J9601 Acute respiratory failure with hypoxia: Secondary | ICD-10-CM | POA: Insufficient documentation

## 2015-12-29 DIAGNOSIS — J9602 Acute respiratory failure with hypercapnia: Secondary | ICD-10-CM

## 2015-12-29 DIAGNOSIS — A408 Other streptococcal sepsis: Secondary | ICD-10-CM

## 2015-12-29 LAB — CBC
HCT: 36.2 % (ref 36.0–46.0)
Hemoglobin: 10.8 g/dL — ABNORMAL LOW (ref 12.0–15.0)
MCH: 25.2 pg — ABNORMAL LOW (ref 26.0–34.0)
MCHC: 29.8 g/dL — AB (ref 30.0–36.0)
MCV: 84.6 fL (ref 78.0–100.0)
PLATELETS: 223 10*3/uL (ref 150–400)
RBC: 4.28 MIL/uL (ref 3.87–5.11)
RDW: 16.7 % — AB (ref 11.5–15.5)
WBC: 8.5 10*3/uL (ref 4.0–10.5)

## 2015-12-29 LAB — GLUCOSE, CAPILLARY
GLUCOSE-CAPILLARY: 105 mg/dL — AB (ref 65–99)
GLUCOSE-CAPILLARY: 81 mg/dL (ref 65–99)
GLUCOSE-CAPILLARY: 88 mg/dL (ref 65–99)
GLUCOSE-CAPILLARY: 92 mg/dL (ref 65–99)
Glucose-Capillary: 107 mg/dL — ABNORMAL HIGH (ref 65–99)
Glucose-Capillary: 106 mg/dL — ABNORMAL HIGH (ref 65–99)

## 2015-12-29 LAB — BASIC METABOLIC PANEL
ANION GAP: 14 (ref 5–15)
BUN: 16 mg/dL (ref 6–20)
CALCIUM: 8.6 mg/dL — AB (ref 8.9–10.3)
CO2: 40 mmol/L — ABNORMAL HIGH (ref 22–32)
Chloride: 87 mmol/L — ABNORMAL LOW (ref 101–111)
Creatinine, Ser: 1.13 mg/dL — ABNORMAL HIGH (ref 0.44–1.00)
GFR, EST AFRICAN AMERICAN: 60 mL/min — AB (ref >60)
GFR, EST NON AFRICAN AMERICAN: 51 mL/min — AB (ref >60)
Glucose, Bld: 90 mg/dL (ref 65–99)
POTASSIUM: 3 mmol/L — AB (ref 3.5–5.1)
Sodium: 141 mmol/L (ref 135–145)

## 2015-12-29 LAB — PHOSPHORUS: PHOSPHORUS: 4.2 mg/dL (ref 2.5–4.6)

## 2015-12-29 LAB — MAGNESIUM: MAGNESIUM: 1.8 mg/dL (ref 1.7–2.4)

## 2015-12-29 LAB — TSH: TSH: 4.122 u[IU]/mL (ref 0.350–4.500)

## 2015-12-29 MED ORDER — INSULIN ASPART 100 UNIT/ML ~~LOC~~ SOLN
2.0000 [IU] | Freq: Three times a day (TID) | SUBCUTANEOUS | Status: DC
  Administered 2015-12-30: 4 [IU] via SUBCUTANEOUS
  Administered 2015-12-30 – 2016-01-02 (×5): 2 [IU] via SUBCUTANEOUS

## 2015-12-29 MED ORDER — POTASSIUM CHLORIDE CRYS ER 20 MEQ PO TBCR: 40.0000 meq | EXTENDED_RELEASE_TABLET | Freq: Once | ORAL | Status: DC

## 2015-12-29 MED ORDER — FUROSEMIDE 80 MG PO TABS
120.0000 mg | ORAL_TABLET | Freq: Every day | ORAL | Status: DC
  Filled 2015-12-29 (×2): qty 1

## 2015-12-29 MED ORDER — PANTOPRAZOLE SODIUM 40 MG PO TBEC
40.0000 mg | DELAYED_RELEASE_TABLET | Freq: Every day | ORAL | Status: DC
  Administered 2015-12-29 – 2016-01-02 (×5): 40 mg via ORAL
  Filled 2015-12-29 (×5): qty 1

## 2015-12-29 MED ORDER — FUROSEMIDE 10 MG/ML IJ SOLN
80.0000 mg | Freq: Once | INTRAMUSCULAR | Status: AC
  Administered 2015-12-29: 80 mg via INTRAVENOUS

## 2015-12-29 MED ORDER — POTASSIUM CHLORIDE CRYS ER 20 MEQ PO TBCR
40.0000 meq | EXTENDED_RELEASE_TABLET | Freq: Every day | ORAL | Status: DC
  Administered 2015-12-29 – 2015-12-30 (×2): 40 meq via ORAL
  Filled 2015-12-29 (×2): qty 2

## 2015-12-29 MED ORDER — AMIODARONE HCL 200 MG PO TABS
200.0000 mg | ORAL_TABLET | Freq: Two times a day (BID) | ORAL | Status: DC
  Administered 2015-12-29 – 2015-12-30 (×3): 200 mg via ORAL
  Filled 2015-12-29 (×5): qty 1

## 2015-12-29 MED ORDER — CEPHALEXIN 500 MG PO CAPS
500.0000 mg | ORAL_CAPSULE | Freq: Three times a day (TID) | ORAL | Status: DC
  Administered 2015-12-29 – 2015-12-30 (×4): 500 mg via ORAL
  Filled 2015-12-29 (×8): qty 1

## 2015-12-29 MED ORDER — FUROSEMIDE 10 MG/ML IJ SOLN: 80.0000 mg | Freq: Every day | INTRAMUSCULAR | Status: DC

## 2015-12-29 MED ORDER — FUROSEMIDE 10 MG/ML IJ SOLN
80.0000 mg | Freq: Three times a day (TID) | INTRAMUSCULAR | Status: DC
  Administered 2015-12-29 – 2015-12-30 (×5): 80 mg via INTRAVENOUS
  Filled 2015-12-29 (×5): qty 8

## 2015-12-29 MED ORDER — SERTRALINE HCL 100 MG PO TABS
200.0000 mg | ORAL_TABLET | Freq: Every day | ORAL | Status: DC
  Administered 2015-12-29 – 2016-01-02 (×5): 200 mg via ORAL
  Filled 2015-12-29 (×5): qty 2

## 2015-12-29 MED ORDER — LEVOTHYROXINE SODIUM 75 MCG PO TABS
75.0000 ug | ORAL_TABLET | Freq: Every day | ORAL | Status: DC
  Administered 2015-12-29 – 2015-12-30 (×2): 75 ug via ORAL
  Filled 2015-12-29 (×3): qty 1

## 2015-12-29 MED ORDER — DILTIAZEM HCL ER COATED BEADS 240 MG PO CP24
240.0000 mg | ORAL_CAPSULE | Freq: Every day | ORAL | Status: DC
  Administered 2015-12-29 – 2016-01-02 (×5): 240 mg via ORAL
  Filled 2015-12-29 (×7): qty 1

## 2015-12-29 NOTE — Progress Notes (Signed)
Pt extubated on 1/17 and doing well on 4L of [place] oxygen when had hypoxia with SpO2 in 80's per nurse around 11AM today requiring venti mask and then NRB with improvement of SpO2 to 90's. Pt's transfer was cancelled and stat chest xray was ordered which revealed interstitial edema. Chest xray this AM was normal. Pt was restarted on IV lasix 80 mg TID which was earlier this morning transitioned to home PO lasix 120 mg daily. Pt refuses to wear bipap during day but agrees to at night. Pt revaluated after IV lasix and now with SpO2 on NRB of 97%. Pt stable for transfer to telemetry bed. Dr. Chase Caller saw patient around 4 PM and agreed pt stable for transfer today. [facility] will resume care tomm AM (spoke with Dr. Thereasa Solo).    Juluis Mire MD  PGY-3 IMTS Pager [phone]

## 2015-12-29 NOTE — Progress Notes (Signed)
Patient is still resting comfortably on 4L nasal cannula.  Sats currently at 90%.  Vitals are stable.  Will continue to monitor patient.

## 2015-12-29 NOTE — Discharge Instructions (Addendum)

## 2015-12-29 NOTE — Care Management Note (Signed)
Case Management Note  Patient Details  Name: OGECHI GREGORICH MRN: [id number] Date of Birth: 11-May-1956  Subjective/Objective:   Patient extubated on 1-17.  Laying in bed, awake and alert.  On VM.  States lives with daughter here in  [place].  Has a RW, working on Pension scheme manager with orthopedic physician.  Has a PCP, and cardiologist.  Daughter has a car and is able to transport her.  Confirms is able to get her meds and is taking her meds as scheduled.  At this time has no needs.  At this point is oxygen dependent - will continue to follow for progression.                Action/Plan:   Expected Discharge Date:                  Expected Discharge Plan:  Home/Self Care  In-House Referral:     Discharge planning Services  CM Consult  Post Acute Care Choice:    Choice offered to:     DME Arranged:    DME Agency:     HH Arranged:    HH Agency:     Status of Service:  In process, will continue to follow  Medicare Important Message Given:    Date Medicare IM Given:    Medicare IM give by:    Date Additional Medicare IM Given:    Additional Medicare Important Message give by:     If discussed at [place] of Stay Meetings, dates discussed:    Additional Comments:  Vergie Living, RN 12/29/2015, 11:59 AM

## 2015-12-29 NOTE — Progress Notes (Signed)
PULMONARY / CRITICAL CARE MEDICINE   Name: [name] MRN: [id number] DOB: 1956/09/26    ADMISSION DATE:  12/25/2015 CONSULTATION DATE:  12/24/14  BRIEF PATIENT DESCRIPTION: 60 yo F found unresponsive at home found to be hypoxic and had possible seizure activity requiring intubation.   SIGNIFICANT EVENTS / STUDIES:  1/14 Found unresponsive with hypoxia and possible seizure activity requiring intubation and ativan/keppra 1/14 CT head with no acute findings  1/15 Afib requiring diltiazem drip 1/17 Extubated to bipap 1/17 EEG with slowing, no seizure activity    LINES / TUBES: 1/14 ETT > 1/17   CULTURES: 1/14 MRSA neg 1/14 Blood >  1/14 Urine > negative 1/14 Resp > Strep Group C   ANTIBIOTICS:  1/14 Unasyn > 1/14 Cefepime >1/15 1/14 Vanc >1/15  SUBJECTIVE: Pt afebrile overnight. Was extubated yesterday to bipap but did not wear last night. Has cough but denies other complaints. Currently on 4 L [place]. EEG with no seizure activity. To have MRI brain for further evaluation of possible seizure. Tachycardia improved, still on dilt drip. Made 7.2 L of urine yesterday on IV lasix 80 mg TID.   VITAL SIGNS: Temp:  [97.5 F (36.4 C)-99.9 F (37.7 C)] 98.8 F (37.1 C) (01/18 0600) Pulse Rate:  [83-214] 100 (01/18 0751) Resp:  [12-31] 12 (01/18 0751) BP: (103-141)/(52-92) 117/62 mmHg (01/18 0751) SpO2:  [90 %-99 %] 90 % (01/18 0751) FiO2 (%):  [40 %] 40 % (01/17 2342) Weight:  [300 lb 11.3 oz (136.4 kg)] 300 lb 11.3 oz (136.4 kg) (01/18 0500) HEMODYNAMICS:   VENTILATOR SETTINGS: Vent Mode:  [-] BIPAP;PCV FiO2 (%):  [40 %] 40 % Set Rate:  [12 bmp] 12 bmp PEEP:  [5 cmH20-6 cmH20] 6 cmH20 Pressure Support:  [5 cmH20] 5 cmH20 INTAKE / OUTPUT: Intake/Output      01/17 0701 - 01/18 0700 01/18 0701 - 01/19 0700   I.V. (mL/kg) 508.8 (3.7)    NG/GT 90    IV Piggyback 150    Total Intake(mL/kg) 748.8 (5.5)    Urine (mL/kg/hr) 7235 (2.2)    Total Output 7235     Net -6486.2             PHYSICAL EXAMINATION: General: NAD Neuro: Alert, orientated, and follows commands  HEENT:  Normocephalic Cardiovascular: Normal rate with irregularly irregular rhythm  Lungs: Clear to auscultation in anterior fields Abdomen: Obese, soft, non-tender, non-distended, normal BS Musculoskeletal: Trace to +1 b/l pitting edema  Skin: No rashes  LABS:  CBC  Recent Labs Lab 12/27/15 0232 12/28/15 0239 12/29/15 0331  WBC 7.6 8.2 8.5  HGB 10.7* 10.2* 10.8*  HCT 36.0 34.8* 36.2  PLT 150 188 223   Coag's No results for input(s): APTT, INR in the last 168 hours. BMET  Recent Labs Lab 12/27/15 0232 12/28/15 0239 12/29/15 0331  NA 145 144 141  K 3.4* 3.3* 3.0*  CL 105 97* 87*  CO2 30 34* 40*  BUN 15 15 16   CREATININE 1.24* 1.19* 1.13*  GLUCOSE 109* 112* 90   Electrolytes  Recent Labs Lab 12/27/15 0232 12/28/15 0239 12/29/15 0331  CALCIUM 8.1* 8.3* 8.6*  MG 1.5* 2.2 1.8  PHOS  --  3.0 4.2   Sepsis Markers  Recent Labs Lab 12/25/15 1202 12/25/15 1830 12/25/15 2120 12/26/15 0307  LATICACIDVEN 1.62 2.15*  --   --   PROCALCITON  --   --  <0.10 <0.10   ABG  Recent Labs Lab 12/25/15 1308 12/25/15 2201 12/26/15 0345  PHART 7.133* 7.380 7.349*  PCO2ART 99.8* 49.0* 55.4*  PO2ART 132.0* 55.0* 66.5*   Liver Enzymes  Recent Labs Lab 12/25/15 1256  AST 31  ALT 15  ALKPHOS 111  BILITOT 0.9  ALBUMIN 3.1*   Cardiac Enzymes No results for input(s): TROPONINI, PROBNP in the last 168 hours. Glucose  Recent Labs Lab 12/28/15 0834 12/28/15 1204 12/28/15 1606 12/28/15 1942 12/29/15 0023 12/29/15 0310  GLUCAP 104* 119* 104* 114* 92 81    Imaging No results found.   ASSESSMENT / PLAN:   PULMONARY A: Acute on chronic hypercapnic respiratory failure requiring mechanical ventilation - extubated on 1/17 Aspiration pneumonia - CXR with no consoldiation  OSA  OHS History of tobacco use P:  Oxygen therapy to keep SpO2> 92% Albuterol Q  2 hr PRN  Continue IV unasyn, see ID CPAP or BiPAP at night mandatory  To have sleep study soon  CARDIOVASCULAR A:  Paroxysmal afib - CHADS2VASC of 3 Acute on chronic CHF - last EF 55-60%, BNP elevated at 370 - resolved. CXR with no edema. Moderate mitral regurgation  Hypertension CAD P:  Wean diltiazem drip, transition to home 240 mg daily Continue lopressor 25 mg BID  Continue digoxin 0.25 mg daily  Xarelto 20 mg daily for [facility] therapy  Decrease IV lasix 80 mg TID to daily (on PO 120 mg at home) Hold home losartan and propanolol  Follow-up 2D-echo 2 g sodium diet   RENAL A: Non-oliguric AKI (baseline Cr 0.9-1) - resolved Hypokalemia - in setting of diuresis  Hypomagnesemia - resolved Metabolic alkalosis in setting of diuretic use P:  Monitor BMP  Kdur 40 mEq daily  Decease lasix 80 mg TID to daily  Monitor UOP  GASTROINTESTINAL A:  Nutrition SUP  GERD Morbid obesity P:  2 g sodium restricted diet Continue PO protonix 40 mg daily   HEMATOLOGIC/ONCOLOGICAL  A: Chronic normocytic anemia - Hg stable  Leukocytosis - resolved  History of endometrial cancer DVT Ppx P:  Monitor CBC Xarelto for AC therapy   INFECTIOUS A: Aspiration PNA - group C Strep P:  Continue unasyn Day 5/7   ENDOCRINE A:  Hypoglycemia - resolved  Non-insulin dependent Type 2 DM - last A1c 6.5 on 11/09/14  Hypothyroidism  P:  SSI insulin per ICU protocol Continue home synthroid 75 mcg daily     NEUROLOGIC A:  Questionable seizure activity, most likely secondary due to hypoxia - EEG with no seizure activity  Methadone dependence  Adult ADHD Depression  P:  Follow-up MRI brain  No AED per neuro unless MRI or EEG abnormal Methadone 5 mg TID (at home on 70 mg daily) Hold home ritalin in setting of tachycardia  Continue home zoloft 200 mg daily   Conal Shetley  PGY-3 IMTS Pager [phone]

## 2015-12-29 NOTE — Procedures (Signed)
Pt removed by BiPAP and placed on [place] @ 4L by RN.  Pt is comfortable.

## 2015-12-29 NOTE — Progress Notes (Signed)
  Echocardiogram 2D Echocardiogram has been performed.  [name] 12/29/2015, 9:57 AM

## 2015-12-29 NOTE — Evaluation (Signed)
Clinical/Bedside Swallow Evaluation Patient Details  Name: [name] MRN: [id number] Date of Birth: [birth year]  Today's Date: 12/29/2015 Time: SLP Start Time (ACUTE ONLY): 0810 SLP Stop Time (ACUTE ONLY): 0830 SLP Time Calculation (min) (ACUTE ONLY): 20 min  Past Medical History:  Past Medical History  Diagnosis Date  . OSA (obstructive sleep apnea)   . CHF (congestive heart failure) ([place])   . Diabetes mellitus   . Altered mental status     2nd to hypercapnea  . Acute and chronic respiratory failure with hypercapnia ([place])   . Hypoxemia   . Endometrial cancer ([place])   . Morbid obesity ([place])   . Depression   . Hypothyroidism   . GERD (gastroesophageal reflux disease)   . Nephrolithiasis   . Chronic low back pain   . ADD (attention deficit disorder)   . Restless leg syndrome   . Coronary artery disease   . Complication of anesthesia     " I AM SLOW MTO WAKE UP AT ITMES "  . Hypertension   . Hypercapnia   . Paroxysmal atrial fibrillation [facility])    Past Surgical History:  Past Surgical History  Procedure Laterality Date  . Back surgery      x2  . Shoulder surgery      x2  . Vaginal hysterectomy      x2  . Dental surgery    . Tonsillectomy    . Sp perc nephrostomy     HPI:  [name] is an 60 y.o. female Hx OSA / OHS / RLS (followed VS), DM, HTN, chronic resp failure. Has been treated recently as outpt for possible bronchitis. She was found by her daughter unresponsive am 1/14. Pt believes she fell asleep while eating. On EMS arrival SpO2 ~ 50's%, improved with O2. On arrival she was showing rhythmic motions thought to be seizure. The movements slowed down with ativan per chart. She was intubated in ED. Intubated from 1/14 to 1/17.    Assessment / Plan / Recommendation Clinical Impression  Pt demonstrates adequate function with trials of liquids and solids. She does report complaint of difficulty swallowing priotr to admit. Pt description of difficulty  consistent with a respiratory based dysphagia with multiple risk factors including shortness of breath, eating in bed and early fatigue with meals. SLP reviewed precautions to reduce risk of aspiration in setting of respiratory deficits. Pt verbalized understanding and handouts were reviewed. No f/u needed as education is complete. Recommend dys 3 (mecahnical soft) diet with thin liquids given missing dentition.     Aspiration Risk  Moderate aspiration risk    Diet Recommendation Dysphagia 3 (Mech soft);Thin liquid   Liquid Administration via: Cup Medication Administration: Whole meds with liquid Supervision: Patient able to self feed Compensations: Slow rate;Small sips/bites;Minimize environmental distractions Postural Changes: Seated upright at 90 degrees    Other  Recommendations Oral Care Recommendations: Oral care BID   Follow up Recommendations  None    Frequency and Duration            Prognosis        Swallow Study   General HPI: [name] is an 60 y.o. female Hx OSA / OHS / RLS (followed VS), DM, HTN, chronic resp failure. Has been treated recently as outpt for possible bronchitis. She was found by her daughter unresponsive am 1/14. Pt believes she fell asleep while eating. On EMS arrival SpO2 ~ 50's%, improved with O2. On arrival she was showing rhythmic motions thought  to be seizure. The movements slowed down with ativan per chart. She was intubated in ED. Intubated from 1/14 to 1/17.  Type of Study: Bedside Swallow Evaluation Diet Prior to this Study: Thin liquids Temperature Spikes Noted: No Respiratory Status: Nasal cannula History of Recent Intubation: Yes Length of Intubations (days): 4 days Date extubated: 12/28/15 Behavior/Cognition: Alert;Cooperative;Pleasant mood Oral Cavity Assessment: Within Functional Limits Oral Care Completed by SLP: No Oral Cavity - Dentition: Edentulous Vision: Functional for self-feeding Self-Feeding Abilities: Able to feed  self Patient Positioning: Upright in bed Baseline Vocal Quality: Normal Volitional Cough: Strong Volitional Swallow: Able to elicit    Oral/Motor/Sensory Function Overall Oral Motor/Sensory Function: Within functional limits   Ice Chips     Thin Liquid Thin Liquid: Within functional limits Presentation: Cup;Straw;Self Fed    Nectar Thick Nectar Thick Liquid: Not tested   Honey Thick Honey Thick Liquid: Not tested   Puree Puree: Within functional limits   Solid   GO   Solid: Within functional limits (required liquid wash to moisten bolus) Presentation: Anna Nillie Bartolotta, MA CCC-SLP [phone]   Lynann Beaver 12/29/2015,9:11 AM

## 2015-12-29 NOTE — Progress Notes (Signed)
Report called to nurse. Patient is stable with bp 124/79 and sats 97% on NRB.

## 2015-12-29 NOTE — Progress Notes (Signed)
Subjective: No further seizure activity, no complaints.   Exam: Filed Vitals:   12/29/15 0800 12/29/15 0824  BP: 105/77   Pulse: 97   Temp: 98.8 F (37.1 C) 97.4 F (36.3 C)  Resp: 11         Gen: In bed, NAD MS: Alert and oriented CN: PERRLA, EOMI, TML Face symmetric Motor: moving all extremities.  Has bilateral leg and head tremor which is old.  Sensory: intact   Pertinent Labs: EEG shows no epileptiform activitty  Etta Quill PA-C [facility] [phone]  Impression: 60 YO female with possible seizure activity on admission.  Per patient she has bilateral leg jerking and head bobbing which is chronic. Unclear if this may of been what was seen. EEG shows no abnormality. Will obtain MRI if normal would not initiate AED and will S/O       12/29/2015, 9:33 AM Patient seen and examined together with physician assistant and I concur with the assessment and plan.  Dorian Pod, MD

## 2015-12-29 NOTE — Evaluation (Signed)
Physical Therapy Evaluation Patient Details Name: [name] MRN: [id number] DOB: 1956-12-04 Today's Date: 12/29/2015   History of Present Illness  Patient is a 60 y/o female with hx of OSA, DM, HTN, chronic resp failure, morbid obesity, Paroxysmal A-fib who was found by her daughter unresponsive am 1/14. On EMS arrival SpO2 ~ 50's%, improved with O2. Experienced increasing rhythmic movements consistent w/ seizure activity, slowed with ativan in ED. Intubated 1/14-1/17.  Clinical Impression  Patient presents with functional limitations due to deficits listed in PT problem list (see below). Pt with generalized weakness, dyspnea on exertion, decreased endurance, balance and overall mobility. Sp02 stable during session on NRB.Tolerated side stepping along side bed with Min A for balance/safety. Pt will need initial 24/7 S from daughter at d/c and HHPT to maximize independence and mobility. Will follow acutely.      Follow Up Recommendations Home health PT;Supervision/Assistance - 24 hour    Equipment Recommendations       Recommendations for Other Services OT consult     Precautions / Restrictions Precautions Precautions: Fall Restrictions Weight Bearing Restrictions: No      Mobility  Bed Mobility Overal bed mobility: Needs Assistance Bed Mobility: Supine to Sit;Sit to Supine     Supine to sit: Mod assist;HOB elevated Sit to supine: Mod assist   General bed mobility comments: Assist to scoot bottom to EOB and elevate trunk. Increased time. Increased WOB. Use of rail for support.   Transfers Overall transfer level: Needs assistance Equipment used: Rolling walker (2 wheeled) Transfers: Sit to/from Stand Sit to Stand: Mod assist;+2 physical assistance         General transfer comment: Stood from EOB x1 with cues for hand placement and technique. Pt anxious in standing, "I can't i can't."  Ambulation/Gait Ambulation/Gait assistance: Min assist Ambulation Distance  (Feet): 4 Feet Assistive device: Rolling walker (2 wheeled) Gait Pattern/deviations: Step-to pattern;Decreased stride length   Gait velocity interpretation: Below normal speed for age/gender General Gait Details: Able to side step along side bed with min A for balance. SP02 remained in 90s on NRB mask and HR increased to 120s. Instability in bil knees but no buckling.  Stairs            Wheelchair Mobility    Modified Rankin (Stroke Patients Only)       Balance Overall balance assessment: Needs assistance Sitting-balance support: Feet supported;No upper extremity supported Sitting balance-Leahy Scale: Fair Sitting balance - Comments: Sat EOB unsupported for ~15 minutes.    Standing balance support: During functional activity Standing balance-Leahy Scale: Poor Standing balance comment: Reliant on RW for support.                              Pertinent Vitals/Pain Pain Assessment: Faces Faces Pain Scale: Hurts even more Pain Location: "everywhere" Pain Descriptors / Indicators: Sore;Aching Pain Intervention(s): Monitored during session;Repositioned    Home Living Family/patient expects to be discharged to:: Private residence Living Arrangements: Children (daughter works part time) Available Help at Discharge: Family;Available PRN/intermittently Type of Home: House Home Access: Stairs to enter Entrance Stairs-Rails: Right Entrance Stairs-Number of Steps: 5 Home Layout: One level Home Equipment: Walker - 2 wheels      Prior Function Level of Independence: Independent with assistive device(s)         Comments: Pt using RW PTA. Waiting on electric w/c.     Hand Dominance        Extremity/Trunk  Assessment   Upper Extremity Assessment: Defer to OT evaluation           Lower Extremity Assessment: Generalized weakness         Communication   Communication: No difficulties  Cognition Arousal/Alertness: Awake/alert Behavior During  Therapy: WFL for tasks assessed/performed Overall Cognitive Status: Within Functional Limits for tasks assessed                      General Comments General comments (skin integrity, edema, etc.): VSS throughout    Exercises        Assessment/Plan    PT Assessment Patient needs continued PT services  PT Diagnosis Difficulty walking;Generalized weakness   PT Problem List Decreased strength;Cardiopulmonary status limiting activity;Decreased activity tolerance;Decreased balance;Decreased mobility  PT Treatment Interventions Balance training;Gait training;Stair training;Functional mobility training;Therapeutic activities;Therapeutic exercise;Patient/family education   PT Goals (Current goals can be found in the Care Plan section) Acute Rehab PT Goals Patient Stated Goal: to return to independence PT Goal Formulation: With patient Time For Goal Achievement: 01/12/16 Potential to Achieve Goals: Good    Frequency Min 3X/week   Barriers to discharge Decreased caregiver support;Inaccessible home environment 5 steps to enter home and daughter works during the day    Co-evaluation               End of [place] During Treatment: Gait belt;Oxygen (NRB mask) Activity Tolerance: Patient tolerated treatment well Patient left: in bed;with call bell/phone within reach;with bed alarm set Nurse Communication: Mobility status         Time: [id number] PT Time Calculation (min) (ACUTE ONLY): 35 min   Charges:   PT Evaluation $PT Eval Moderate Complexity: 1 Procedure PT Treatments $Therapeutic Activity: 8-22 mins   PT G Codes:        Eldra Word A Cabella Kimm 12/29/2015, 3:41 PM Wray Kearns, PT, DPT [phone]

## 2015-12-29 NOTE — Progress Notes (Signed)
RT spoke to patient about wearing BiPAP and patient stated she didn't want to wear due to hospital provided BiPAP is uncomfortable. RT advised that if she changes her mind to advise RN to contact respiratory.  RN present for conversation. RT will continue to monitor.

## 2015-12-29 NOTE — Progress Notes (Signed)
RN asked for pt to be on higher O2 d/t pt desat.  Pt sat 85-88% on 6 lpm [place].  Placed pt on 55% VM- sat 93-94% now.  RN aware.  NO distress noted.

## 2015-12-29 NOTE — Progress Notes (Signed)
Patient O2 saturation in the 80's consistently. Spoke with E-link about the patient's sats and notified RT. Patient to be placed on venti mask for improvement.

## 2015-12-29 NOTE — Progress Notes (Signed)
Several minutes after changing to VM, pt continue to have desat episodes 84-88% on 55% VM.  POx probe was changed to verify. Changed pt to NRB mask, sat 93-94%.  [facility] clear, no distress noted, pt is able to speak in full sentences and is alert and oriented.  RN aware.

## 2015-12-29 NOTE — Consult Note (Signed)
Ref: Anna Riddle, MD   Subjective:  Off BiPAP. Preserved LV systolic function. Atrial fibrillation continues with lower ventricular rate than yesterday.  Objective:  Vital Signs in the last 24 hours: Temp:  [97.4 F (36.3 C)-99.9 F (37.7 C)] 97.4 F (36.3 C) (01/18 0824) Pulse Rate:  [83-214] 97 (01/18 0800) Cardiac Rhythm:  [-] Atrial fibrillation (01/18 0400) Resp:  [11-31] 11 (01/18 0800) BP: (103-141)/(52-92) 105/77 mmHg (01/18 0800) SpO2:  [88 %-99 %] 88 % (01/18 0800) FiO2 (%):  [40 %] 40 % (01/17 2342) Weight:  [136.4 kg (300 lb 11.3 oz)] 136.4 kg (300 lb 11.3 oz) (01/18 0500)  Physical Exam: BP Readings from Last 1 Encounters:  12/29/15 105/77    Wt Readings from Last 1 Encounters:  12/29/15 136.4 kg (300 lb 11.3 oz)    Weight change: -7.6 kg (-16 lb 12.1 oz)  HEENT: [place]/AT, Eyes-Blue, PERL, EOMI, Conjunctiva-Pink, Sclera-Non-icteric Neck: No JVD, No bruit, Trachea midline. Lungs:  Clearing, Bilateral. Cardiac:  Regular rhythm, normal S1 and S2, no S3.  Abdomen:  Soft, non-tender. Extremities:  1 + edema present. No cyanosis. No clubbing. CNS: AxOx3, Cranial nerves grossly intact, moves all 4 extremities. Right handed. Skin: Warm and dry.   Intake/Output from previous day: 01/17 0701 - 01/18 0700 In: 768.8 [I.V.:528.8; NG/GT:90; IV Piggyback:150] Out: 7235 [Urine:7235]    Lab Results: BMET    Component Value Date/Time   NA 141 12/29/2015 0331   NA 144 12/28/2015 0239   NA 145 12/27/2015 0232   K 3.0* 12/29/2015 0331   K 3.3* 12/28/2015 0239   K 3.4* 12/27/2015 0232   CL 87* 12/29/2015 0331   CL 97* 12/28/2015 0239   CL 105 12/27/2015 0232   CO2 40* 12/29/2015 0331   CO2 34* 12/28/2015 0239   CO2 30 12/27/2015 0232   GLUCOSE 90 12/29/2015 0331   GLUCOSE 112* 12/28/2015 0239   GLUCOSE 109* 12/27/2015 0232   BUN 16 12/29/2015 0331   BUN 15 12/28/2015 0239   BUN 15 12/27/2015 0232   CREATININE 1.13* 12/29/2015 0331   CREATININE 1.19* 12/28/2015  0239   CREATININE 1.24* 12/27/2015 0232   CALCIUM 8.6* 12/29/2015 0331   CALCIUM 8.3* 12/28/2015 0239   CALCIUM 8.1* 12/27/2015 0232   GFRNONAA 51* 12/29/2015 0331   GFRNONAA 48* 12/28/2015 0239   GFRNONAA 46* 12/27/2015 0232   GFRAA 60* 12/29/2015 0331   GFRAA 56* 12/28/2015 0239   GFRAA 53* 12/27/2015 0232   CBC    Component Value Date/Time   WBC 8.5 12/29/2015 0331   RBC 4.28 12/29/2015 0331   HGB 10.8* 12/29/2015 0331   HCT 36.2 12/29/2015 0331   PLT 223 12/29/2015 0331   MCV 84.6 12/29/2015 0331   MCH 25.2* 12/29/2015 0331   MCHC 29.8* 12/29/2015 0331   RDW 16.7* 12/29/2015 0331   LYMPHSABS 2.0 12/25/2015 1130   MONOABS 1.2* 12/25/2015 1130   EOSABS 0.0 12/25/2015 1130   BASOSABS 0.1 12/25/2015 1130   HEPATIC Function Panel  Recent Labs  12/25/15 1256  PROT 8.2*   HEMOGLOBIN A1C No components found for: HGA1C,  MPG CARDIAC ENZYMES Lab Results  Component Value Date   CKTOTAL 96 11/12/2007   CKMB 1.8 11/12/2007   TROPONINI <0.30 11/08/2014   TROPONINI <0.30 04/12/2014   TROPONINI <0.30 04/12/2014   BNP No results for input(s): PROBNP in the last 8760 hours. TSH  Recent Labs  12/29/15 0331  TSH 4.122   CHOLESTEROL No results for input(s): CHOL in the  last 8760 hours.  Scheduled Meds: . amiodarone  400 mg Oral BID  . antiseptic oral rinse  7 mL Mouth Rinse QID  . cephALEXin  500 mg Oral 3 times per day  . chlorhexidine gluconate  15 mL Mouth Rinse BID  . diltiazem  240 mg Oral Daily  . furosemide  120 mg Oral Daily  . insulin aspart  2-6 Units Subcutaneous TID WC  . levothyroxine  75 mcg Oral QAC breakfast  . methadone  5 mg Per Tube TID  . metoprolol tartrate  25 mg Oral BID  . pantoprazole  40 mg Oral Daily  . potassium chloride SA  40 mEq Oral Daily  . rivaroxaban  20 mg Oral Q supper  . sertraline  200 mg Oral Daily   Continuous Infusions: . sodium chloride 10 mL/hr at 12/28/15 0700   PRN Meds:.albuterol  Assessment/Plan: Atrial  fibrillation with CHA2DS2VASc score of 4/9 Acute on chronic respiratory failure CAD Hypertension Acute on chronic diastolic heart failure DM, II Morbid obesity Obstructive sleep apnea/obesity hypoventilation syndrome on CPAP  Chronic hypoxic/hypercarbic respiratory failure  COPD  Remote tobacco abuse  Degenerative joint disease  Hypothyroidism   Restless leg syndrome   Add amiodarone. On Xarelto   LOS: 4 days    Dixie Dials  MD  12/29/2015, 9:35 AM

## 2015-12-30 ENCOUNTER — Inpatient Hospital Stay (HOSPITAL_COMMUNITY): Payer: Medicare Other

## 2015-12-30 DIAGNOSIS — J9601 Acute respiratory failure with hypoxia: Secondary | ICD-10-CM

## 2015-12-30 LAB — BASIC METABOLIC PANEL
Anion gap: 12 (ref 5–15)
BUN: 21 mg/dL — ABNORMAL HIGH (ref 6–20)
CO2: 42 mmol/L — ABNORMAL HIGH (ref 22–32)
CREATININE: 1.25 mg/dL — AB (ref 0.44–1.00)
Calcium: 8.8 mg/dL — ABNORMAL LOW (ref 8.9–10.3)
Chloride: 85 mmol/L — ABNORMAL LOW (ref 101–111)
GFR calc Af Amer: 53 mL/min — ABNORMAL LOW (ref >60)
GFR calc non Af Amer: 45 mL/min — ABNORMAL LOW (ref >60)
Glucose, Bld: 101 mg/dL — ABNORMAL HIGH (ref 65–99)
POTASSIUM: 3.6 mmol/L (ref 3.5–5.1)
SODIUM: 139 mmol/L (ref 135–145)

## 2015-12-30 LAB — CULTURE, BLOOD (ROUTINE X 2)
CULTURE: NO GROWTH
Culture: NO GROWTH

## 2015-12-30 LAB — CBC
HEMATOCRIT: 38.1 % (ref 36.0–46.0)
Hemoglobin: 11.4 g/dL — ABNORMAL LOW (ref 12.0–15.0)
MCH: 24.8 pg — ABNORMAL LOW (ref 26.0–34.0)
MCHC: 29.9 g/dL — ABNORMAL LOW (ref 30.0–36.0)
MCV: 83 fL (ref 78.0–100.0)
Platelets: 230 10*3/uL (ref 150–400)
RBC: 4.59 MIL/uL (ref 3.87–5.11)
RDW: 16.3 % — ABNORMAL HIGH (ref 11.5–15.5)
WBC: 9.3 10*3/uL (ref 4.0–10.5)

## 2015-12-30 LAB — GLUCOSE, CAPILLARY
GLUCOSE-CAPILLARY: 98 mg/dL (ref 65–99)
GLUCOSE-CAPILLARY: 101 mg/dL — AB (ref 65–99)
Glucose-Capillary: 178 mg/dL — ABNORMAL HIGH (ref 65–99)
Glucose-Capillary: 131 mg/dL — ABNORMAL HIGH (ref 65–99)

## 2015-12-30 LAB — HCV COMMENT:

## 2015-12-30 LAB — HEPATITIS C ANTIBODY (REFLEX): HCV AB: 0.1 {s_co_ratio} (ref 0.0–0.9)

## 2015-12-30 LAB — MAGNESIUM: Magnesium: 1.7 mg/dL (ref 1.7–2.4)

## 2015-12-30 MED ORDER — METHADONE HCL 10 MG PO TABS
70.0000 mg | ORAL_TABLET | Freq: Every day | ORAL | Status: DC
  Administered 2015-12-30: 70 mg
  Filled 2015-12-30: qty 7

## 2015-12-30 MED ORDER — METHADONE HCL 10 MG PO TABS
40.0000 mg | ORAL_TABLET | Freq: Every day | ORAL | Status: DC
  Administered 2015-12-31: 40 mg via ORAL
  Filled 2015-12-30: qty 4

## 2015-12-30 MED ORDER — LEVOTHYROXINE SODIUM 100 MCG PO TABS
100.0000 ug | ORAL_TABLET | Freq: Every day | ORAL | Status: DC
  Administered 2015-12-31 – 2016-01-02 (×3): 100 ug via ORAL
  Filled 2015-12-30 (×3): qty 1

## 2015-12-30 MED ORDER — LEVALBUTEROL HCL 0.63 MG/3ML IN NEBU
0.6300 mg | INHALATION_SOLUTION | Freq: Four times a day (QID) | RESPIRATORY_TRACT | Status: DC | PRN
Start: 2015-12-30 — End: 2016-01-02

## 2015-12-30 MED ORDER — METOPROLOL TARTRATE 50 MG PO TABS: 50.0000 mg | ORAL_TABLET | Freq: Two times a day (BID) | ORAL | Status: DC

## 2015-12-30 MED ORDER — METOPROLOL TARTRATE 50 MG PO TABS
50.0000 mg | ORAL_TABLET | Freq: Two times a day (BID) | ORAL | Status: DC
  Administered 2015-12-30 – 2015-12-31 (×3): 50 mg via ORAL
  Filled 2015-12-30 (×3): qty 1

## 2015-12-30 NOTE — Progress Notes (Signed)
[facility] PROGRESS NOTE  [name] [id number] DOB: 12/27/1955 DOA: 12/25/2015 PCP: Birdie Riddle, MD  Assessment/Plan: 1. SEIZURE actitivty on admission. Unclear etiology. MRI brain ordered and pending.  2. Hypothyroidism: resume synthroids 3. Atrial fibrillation with RVR: rate better controlled than yesterday but still in 110's at rest. On cardizem po 240 mg daily and  Increased metoprolol  To 50 mg bID. ON xarelto. Amiodarone discontinued.  4. Acute hypercapnic and hypoxemic respiratory failure:  Improved and on 2 lit of [employer] oxygen. Resume bronchodilators.  5. Acute on chronic diastolic heart failure: Diuresis with IV lasix and has diuresed about 14.4 liters since admission.  Monitor renal parameters and electrolytes on IV lasix.  6. Group c streptococcus on cultures: on keflex to complete the course.   7. Non oliguric aki resolved  8. Hypokalemia and hypomagnesemia: resolved.    9 chronic normocytic anemia: stable.   10. Non insulin dependent type 2 DM:  CBG (last 3)   Recent Labs  12/30/15 0735 12/30/15 1111 12/30/15 1639  GLUCAP 98 178* 131*      Code Status: FULL CODE. Family Communication: none at bedside Disposition Plan: pending.    Consultants:  Cardiology  Pulmonology  neurology  Procedures:  EEG  MRI BRAIN 1/14 ETT > 1/17  SIGNIFICANT EVENTS / STUDIES:  1/14 Found unresponsive with hypoxia and possible seizure activity requiring intubation and ativan/keppra 1/14 CT head with no acute findings  1/15 Afib requiring diltiazem drip 1/17 Extubated to bipap 1/17 EEG with slowing, no seizure activity   Antibiotics:  KEFLEX  HPI/Subjective: Reports feeling shaky,.   Objective: Filed Vitals:   12/30/15 0800 12/30/15 1000  BP: 124/85 115/69  Pulse: 99 88  Temp:    Resp: 10 11    Intake/Output Summary (Last 24 hours) at 12/30/15 1158 Last data filed at 12/30/15 0800  Gross per 24 hour  Intake   1200 ml  Output    1950 ml  Net   -750 ml   Filed Weights   12/28/15 0414 12/29/15 0500 12/30/15 0500  Weight: 144 kg (317 lb 7.4 oz) 136.4 kg (300 lb 11.3 oz) 134.174 kg (295 lb 12.8 oz)    Exam:   General:  Alert afebrile comfortable  Cardiovascular: s1s2  Respiratory: diminished at bases, no wheezing or rhonchi  Abdomen: soft non tender non distended bowel sounds heard   Musculoskeletal:   Data Reviewed: Basic Metabolic Panel:  Recent Labs Lab 12/26/15 0307 12/27/15 0232 12/28/15 0239 12/29/15 0331 12/30/15 0356  NA 143 145 144 141 139  K 4.1 3.4* 3.3* 3.0* 3.6  CL 105 105 97* 87* 85*  CO2 29 30 34* 40* 42*  GLUCOSE 126* 109* 112* 90 101*  BUN 15 15 15 16  21*  CREATININE 1.05* 1.24* 1.19* 1.13* 1.25*  CALCIUM 8.2* 8.1* 8.3* 8.6* 8.8*  MG  --  1.5* 2.2 1.8 1.7  PHOS  --   --  3.0 4.2  --    Liver Function Tests:  Recent Labs Lab 12/25/15 1256  AST 31  ALT 15  ALKPHOS 111  BILITOT 0.9  PROT 8.2*  ALBUMIN 3.1*   No results for input(s): LIPASE, AMYLASE in the last 168 hours. No results for input(s): AMMONIA in the last 168 hours. CBC:  Recent Labs Lab 12/25/15 1130  12/26/15 0307 12/27/15 0232 12/28/15 0239 12/29/15 0331 12/30/15 0356  WBC 14.4*  < > 9.0 7.6 8.2 8.5 9.3  NEUTROABS 11.1*  --   --   --   --   --   --  HGB 12.2  < > 10.2* 10.7* 10.2* 10.8* 11.4*  HCT 43.2  < > 35.7* 36.0 34.8* 36.2 38.1  MCV 89.4  < > 85.0 82.9 83.5 84.6 83.0  PLT 304  < > 180 150 188 223 230  < > = values in this interval not displayed. Cardiac Enzymes: No results for input(s): CKTOTAL, CKMB, CKMBINDEX, TROPONINI in the last 168 hours. BNP (last 3 results)  Recent Labs  12/25/15 1149  BNP 369.8*    ProBNP (last 3 results) No results for input(s): PROBNP in the last 8760 hours.  CBG:  Recent Labs Lab 12/29/15 1214 12/29/15 1611 12/29/15 2228 12/30/15 0735 12/30/15 1111  GLUCAP 107* 106* 105* 98 178*    Recent Results (from the past 240 hour(s))  Blood  Culture (routine x 2)     Status: None (Preliminary result)   Collection Time: 12/25/15 11:45 AM  Result Value Ref Range Status   Specimen Description BLOOD LEFT ANTECUBITAL  Final   Special Requests IN PEDIATRIC BOTTLE 2CC  Final   Culture NO GROWTH 4 DAYS  Final   Report Status PENDING  Incomplete  Blood Culture (routine x 2)     Status: None (Preliminary result)   Collection Time: 12/25/15 11:50 AM  Result Value Ref Range Status   Specimen Description BLOOD RIGHT ANTECUBITAL  Final   Special Requests   Final    BOTTLES DRAWN AEROBIC AND ANAEROBIC 10CC AER 5CC ANA   Culture NO GROWTH 4 DAYS  Final   Report Status PENDING  Incomplete  Urine culture     Status: None   Collection Time: 12/25/15 12:09 PM  Result Value Ref Range Status   Specimen Description URINE, CLEAN CATCH  Final   Special Requests NONE  Final   Culture NO GROWTH 1 DAY  Final   Report Status 12/26/2015 FINAL  Final  MRSA PCR Screening     Status: None   Collection Time: 12/25/15  9:10 PM  Result Value Ref Range Status   MRSA by PCR NEGATIVE NEGATIVE Final    Comment:        The GeneXpert MRSA Assay (FDA approved for NASAL specimens only), is one component of a comprehensive MRSA colonization surveillance program. It is not intended to diagnose MRSA infection nor to guide or monitor treatment for MRSA infections.   Culture, respiratory (NON-Expectorated)     Status: None   Collection Time: 12/25/15  9:35 PM  Result Value Ref Range Status   Specimen Description TRACHEAL ASPIRATE  Final   Special Requests NONE  Final   Gram Stain   Final    FEW WBC PRESENT,BOTH PMN AND MONONUCLEAR NO SQUAMOUS EPITHELIAL CELLS SEEN FEW GRAM NEGATIVE RODS Performed at [facility]    Culture   Final    MODERATE STREPTOCOCCUS GROUP C Note: Beta hemolytic streptococci are predictably susceptible to penicillin and other beta lactams. Susceptibility testing not routinely performed. Performed at [facility]    Report Status 12/28/2015 FINAL  Final     Studies: Dg Chest Port 1 View  12/30/2015  CLINICAL DATA:  Shortness of Breath EXAM: PORTABLE CHEST 1 VIEW COMPARISON:  December 29, 2015 FINDINGS: The previously noted mild interstitial edema has resolved. Currently there is slight atelectasis in the left base. The lungs elsewhere clear. Heart is mildly enlarged with pulmonary vascularity within normal limits. No adenopathy. No bone lesions. IMPRESSION: Mild left base atelectasis. Lungs elsewhere clear. Stable cardiac prominence. No new opacity. Electronically Signed  By: Lowella Grip III M.D.   On: 12/30/2015 08:04   Dg Chest Port 1 View  12/29/2015  CLINICAL DATA:  Hypoxia today. EXAM: PORTABLE CHEST 1 VIEW COMPARISON:  12/29/2015 at 501 hours. FINDINGS: Mildly degraded exam due to AP portable technique and patient body habitus. Right hemidiaphragm elevation. Midline trachea. Cardiomegaly accentuated by AP portable technique. Atherosclerosis in the transverse aorta. No definite pleural fluid. The Chin overlies the apices. No pneumothorax. Development of mild interstitial edema, accentuated by AP portable technique. Persistent bibasilar airspace disease. IMPRESSION: Cardiomegaly with low lung volumes and development of interstitial edema. Similar bibasilar Airspace disease, likely atelectasis. Decreased sensitivity and specificity exam due to technique related factors, as described above. Electronically Signed   By: Abigail Miyamoto M.D.   On: 12/29/2015 12:13   Dg Chest Port 1 View  12/29/2015  CLINICAL DATA:  Shortness of Breath EXAM: PORTABLE CHEST 1 VIEW COMPARISON:  December 27, 2015 FINDINGS: There is mild bibasilar atelectatic change. There is no edema or consolidation. Heart is borderline enlarged with pulmonary vascularity within normal limits. No adenopathy. No pneumothorax. There is degenerative change in the thoracic spine. IMPRESSION: Bibasilar atelectasis. No frank edema or  consolidation. Borderline cardiac prominence. Electronically Signed   By: Lowella Grip III M.D.   On: 12/29/2015 07:56    Scheduled Meds: . amiodarone  200 mg Oral BID  . antiseptic oral rinse  7 mL Mouth Rinse QID  . cephALEXin  500 mg Oral 3 times per day  . chlorhexidine gluconate  15 mL Mouth Rinse BID  . diltiazem  240 mg Oral Daily  . furosemide  80 mg Intravenous TID  . insulin aspart  2-6 Units Subcutaneous TID WC  . levothyroxine  75 mcg Oral QAC breakfast  . methadone  70 mg Per Tube Daily  . metoprolol tartrate  25 mg Oral BID  . pantoprazole  40 mg Oral Daily  . potassium chloride SA  40 mEq Oral Daily  . rivaroxaban  20 mg Oral Q supper  . sertraline  200 mg Oral Daily   Continuous Infusions:   Principal Problem:   Acute on chronic respiratory failure (HCC) Active Problems:   Hypercapnic respiratory failure, chronic (HCC)   Paroxysmal atrial fibrillation (HCC)   Aspiration pneumonia ([place])   Sepsis ([place])   Acute respiratory failure with hypoxia and hypercapnia (HCC)   Seizures ([place])    Time spent: 25 minutes    [facility] Pager [phone]. If 7PM-7AM, please contact night-coverage at [email or web address], password [facility] 12/30/2015, 11:58 AM  LOS: 5 days

## 2015-12-30 NOTE — Care Management Important Message (Signed)
Important Message  Patient Details  Name: [name] MRN: [id number] Date of Birth: Dec 15, 1955   Medicare Important Message Given:  Yes    Nathen May 12/30/2015, 3:05 PM

## 2015-12-30 NOTE — Progress Notes (Signed)
Nutrition Follow-up  DOCUMENTATION CODES:   Obesity unspecified  INTERVENTION:  -RD to continue to monitor for needs -Ensure Enlive po BID, each supplement provides 350 kcal and 20 grams of protein  NUTRITION DIAGNOSIS:   Inadequate oral intake related to inability to eat as evidenced by NPO status.  Pt is on regular diet now  GOAL:   Provide needs based on ASPEN/SCCM guidelines  Pt will meet 90% of needs  MONITOR:   I & O's, Diet advancement, Vent status, Labs, Weight trends, Skin  REASON FOR ASSESSMENT:   Ventilator    ASSESSMENT:   60 yo morbidly obese woman, Hx OSA / OHS / RLS (followed VS), DM, HTN, chronic resp failure. Has been treated recently as outpt for possible bronchitis. She was found by her daughter unresponsive am 1/14. On EMS arrival SpO2 ~ 50's%, improved with O2. Experienced increasing rhythmic movements consistent w seizure activity, slowed with ativan in ED. She was obtunded and required intubation for MV. No further seizure activity described since. She is currently intubated, sedated, hemodynamically stable.   Pt has been extubated; PO intake in chart 100%. No acute complaints from patient related to v/d/c or chewing/swallowing problems. Some nausea, relieved with medication. Pt is on 4L  at this point. Did not use BIPAP overnight - SpO2 stayed in the 90s. Per MD note patient is either going to transfer to telemetry or discharge. Continue to follow for PO intake.  Diet Order:  DIET DYS 3 Room service appropriate?: Yes; Fluid consistency:: Thin  Skin:  Reviewed, no issues  Last BM:  PTA  Height:   Ht Readings from Last 1 Encounters:  10/29/15 5\' 5"  (1.651 m)    Weight:   Wt Readings from Last 1 Encounters:  12/30/15 295 lb 12.8 oz (134.174 kg)    Ideal Body Weight:     BMI:  Body mass index is 49.22 kg/(m^2).  Estimated Nutritional Needs:   Kcal:  1250-1420  Protein:  142 grams  Fluid:  >/= 1L  EDUCATION NEEDS:   No  education needs identified at this time  Satira Anis. Mystie Ormand, MS, RD LDN After Hours/Weekend Pager [phone]

## 2015-12-30 NOTE — Consult Note (Signed)
Ref: Birdie Riddle, MD   Subjective:  Discussed with patient about decreasing methadone use. She prefer changing heart medicine over methadone. Breathing improved. Leg edema decreasing.  Objective:  Vital Signs in the last 24 hours: Temp:  [98.2 F (36.8 C)-99.5 F (37.5 C)] 98.4 F (36.9 C) (01/19 1406) Pulse Rate:  [48-122] 107 (01/19 1200) Cardiac Rhythm:  [-] Atrial fibrillation (01/19 1200) Resp:  [10-28] 21 (01/19 1200) BP: (108-132)/(65-88) 113/88 mmHg (01/19 1200) SpO2:  [92 %-100 %] 95 % (01/19 1200) Weight:  [134.174 kg (295 lb 12.8 oz)] 134.174 kg (295 lb 12.8 oz) (01/19 0500)  Physical Exam: BP Readings from Last 1 Encounters:  12/30/15 113/88    Wt Readings from Last 1 Encounters:  12/30/15 134.174 kg (295 lb 12.8 oz)    Weight change: -2.226 kg (-4 lb 14.5 oz)  HEENT: [place]/AT, Eyes-Blue, PERL, EOMI, Conjunctiva-Pink, Sclera-Non-icteric Neck: No JVD, No bruit, Trachea midline. Lungs:  Fine crackles at bases, Bilateral. Minimal respiratory distress. Cardiac:  Regular rhythm, normal S1 and S2, no S3. II/VI systolic murmur.  Abdomen:  Soft, non-tender. Extremities:  1 + edema present. No cyanosis. No clubbing. CNS: AxOx3, Cranial nerves grossly intact, moves all 4 extremities. Right handed. Skin: Warm and dry.   Intake/Output from previous day: 01/18 0701 - 01/19 0700 In: 1010 [P.O.:840; I.V.:120; IV Piggyback:50] Out: 2225 [Urine:2225]    Lab Results: BMET    Component Value Date/Time   NA 139 12/30/2015 0356   NA 141 12/29/2015 0331   NA 144 12/28/2015 0239   K 3.6 12/30/2015 0356   K 3.0* 12/29/2015 0331   K 3.3* 12/28/2015 0239   CL 85* 12/30/2015 0356   CL 87* 12/29/2015 0331   CL 97* 12/28/2015 0239   CO2 42* 12/30/2015 0356   CO2 40* 12/29/2015 0331   CO2 34* 12/28/2015 0239   GLUCOSE 101* 12/30/2015 0356   GLUCOSE 90 12/29/2015 0331   GLUCOSE 112* 12/28/2015 0239   BUN 21* 12/30/2015 0356   BUN 16 12/29/2015 0331   BUN 15 12/28/2015 0239    CREATININE 1.25* 12/30/2015 0356   CREATININE 1.13* 12/29/2015 0331   CREATININE 1.19* 12/28/2015 0239   CALCIUM 8.8* 12/30/2015 0356   CALCIUM 8.6* 12/29/2015 0331   CALCIUM 8.3* 12/28/2015 0239   GFRNONAA 45* 12/30/2015 0356   GFRNONAA 51* 12/29/2015 0331   GFRNONAA 48* 12/28/2015 0239   GFRAA 53* 12/30/2015 0356   GFRAA 60* 12/29/2015 0331   GFRAA 56* 12/28/2015 0239   CBC    Component Value Date/Time   WBC 9.3 12/30/2015 0356   RBC 4.59 12/30/2015 0356   HGB 11.4* 12/30/2015 0356   HCT 38.1 12/30/2015 0356   PLT 230 12/30/2015 0356   MCV 83.0 12/30/2015 0356   MCH 24.8* 12/30/2015 0356   MCHC 29.9* 12/30/2015 0356   RDW 16.3* 12/30/2015 0356   LYMPHSABS 2.0 12/25/2015 1130   MONOABS 1.2* 12/25/2015 1130   EOSABS 0.0 12/25/2015 1130   BASOSABS 0.1 12/25/2015 1130   HEPATIC Function Panel  Recent Labs  12/25/15 1256  PROT 8.2*   HEMOGLOBIN A1C No components found for: HGA1C,  MPG CARDIAC ENZYMES Lab Results  Component Value Date   CKTOTAL 96 11/12/2007   CKMB 1.8 11/12/2007   TROPONINI <0.30 11/08/2014   TROPONINI <0.30 04/12/2014   TROPONINI <0.30 04/12/2014   BNP No results for input(s): PROBNP in the last 8760 hours. TSH  Recent Labs  12/29/15 0331  TSH 4.122   CHOLESTEROL No results for  input(s): CHOL in the last 8760 hours.  Scheduled Meds: . antiseptic oral rinse  7 mL Mouth Rinse QID  . cephALEXin  500 mg Oral 3 times per day  . chlorhexidine gluconate  15 mL Mouth Rinse BID  . diltiazem  240 mg Oral Daily  . furosemide  80 mg Intravenous TID  . insulin aspart  2-6 Units Subcutaneous TID WC  . levothyroxine  75 mcg Oral QAC breakfast  . [START ON 12/31/2015] methadone  40 mg Oral Daily  . metoprolol tartrate  50 mg Oral BID  . pantoprazole  40 mg Oral Daily  . potassium chloride SA  40 mEq Oral Daily  . rivaroxaban  20 mg Oral Q supper  . sertraline  200 mg Oral Daily   Continuous Infusions:  PRN  Meds:.albuterol  Assessment/Plan: Atrial fibrillation with CHA2DS2VASc score of 4/9 Acute on chronic respiratory failure CAD Hypertension Acute on chronic diastolic heart failure DM, II Morbid obesity Obstructive sleep apnea/obesity hypoventilation syndrome on CPAP  Chronic hypoxic/hypercarbic respiratory failure  COPD  Remote tobacco abuse  Degenerative joint disease  Hypothyroidism   Restless leg syndrome  Increase metoprolol dose. Discontinue amiodarone due to side effect with methadone. Increase activity as tolerated.     LOS: 5 days    Dixie Dials  MD  12/30/2015, 2:20 PM

## 2015-12-30 NOTE — Progress Notes (Signed)
Physical Therapy Treatment Patient Details Name: QUANTAYA SHINDER MRN: [id number] DOB: 1956-05-25 Today's Date: 12/30/2015    History of Present Illness Patient is a 60 y/o female with hx of OSA, DM, HTN, chronic resp failure, morbid obesity, Paroxysmal A-fib who was found by her daughter unresponsive am 1/14. On EMS arrival SpO2 ~ 50's%, improved with O2. Experienced increasing rhythmic movements consistent w/ seizure activity, slowed with ativan in ED. Intubated 1/14-1/17.    PT Comments    Pt progressing towards all goals. Pt anxious throughout session yet able to perform all mobility asked of her with encouragement. Pt requiring Min A for all mobility and transfers today using RW and multiple cues for energy conservation and safety. Able to ambulate short distance to chair with RW and oxygen. Throughout treatment pts O2 remained in the low 90s on 4L O2. Pt verbalizes eagerness to strengthen legs to get around more. Continuing to recommend HHPT and 24/7 supervision/assistance from family upon discharge.   Follow Up Recommendations  Home health PT;Supervision/Assistance - 24 hour     Equipment Recommendations  None recommended by PT    Recommendations for Other Services       Precautions / Restrictions Precautions Precautions: Fall Restrictions Weight Bearing Restrictions: No    Mobility  Bed Mobility Overal bed mobility: Modified Independent Bed Mobility: Supine to Sit     Supine to sit: Modified independent (Device/Increase time)     General bed mobility comments: Increased WOB,/. With HOB elevated pt able to swing LE to EOB and pull up on bed rails to sit EOB  Transfers Overall transfer level: Needs assistance Equipment used: Rolling walker (2 wheeled) Transfers: Sit to/from [facility] Sit to Stand: Min assist Stand pivot transfers: Min assist;+2 safety/equipment       General transfer comment: Pt less anxious to stand today still requring  motivation and VC's to push from the bed. Able to stand to RW and take 5-6 steps with RW to pivot towards chair. Increased WOB and c/o unable to complete despite performing with little assistance. HR 130s with activtiy and O2 remaining in low 90s on 4L through [place].  Ambulation/Gait Ambulation/Gait assistance: Min assist Ambulation Distance (Feet): 5 Feet Assistive device: Rolling walker (2 wheeled) Gait Pattern/deviations: Step-to pattern;Shuffle   Gait velocity interpretation: Below normal speed for age/gender General Gait Details: Able to clear foot and perform pre-gait acitivites at side of bed with coaxing. c/o leg weakness but no buckling   Stairs            Wheelchair Mobility    Modified Rankin (Stroke Patients Only)       Balance Overall balance assessment: Needs assistance Sitting-balance support: Feet supported;No upper extremity supported Sitting balance-Leahy Scale: Fair     Standing balance support: During functional activity Standing balance-Leahy Scale: Fair Standing balance comment: BUE on RW                    Cognition Arousal/Alertness: Awake/alert Behavior During Therapy: Anxious;WFL for tasks assessed/performed Overall Cognitive Status: Within Functional Limits for tasks assessed                      Exercises      General Comments        Pertinent Vitals/Pain Pain Assessment: No/denies pain    Home Living                      Prior Function  PT Goals (current goals can now be found in the care plan section) Acute Rehab PT Goals Patient Stated Goal: to return to independence PT Goal Formulation: With patient Time For Goal Achievement: 01/12/16 Potential to Achieve Goals: Good Progress towards PT goals: Progressing toward goals    Frequency       PT Plan Current plan remains appropriate    Co-evaluation             End of Session Equipment Utilized During Treatment: Gait  belt;Oxygen Activity Tolerance: Patient limited by fatigue Patient left: in chair;with call bell/phone within reach     Time: 1030-1055 PT Time Calculation (min) (ACUTE ONLY): 25 min  Charges:  $Gait Training: 8-22 mins $Therapeutic Activity: 8-22 mins                    G Codes:      Ara Kussmaul 2016/01/04, 1:19 PM  Ara Kussmaul, Student Physical Therapist Acute Rehab [phone]

## 2015-12-31 DIAGNOSIS — I480 Paroxysmal atrial fibrillation: Secondary | ICD-10-CM

## 2015-12-31 DIAGNOSIS — J9612 Chronic respiratory failure with hypercapnia: Secondary | ICD-10-CM

## 2015-12-31 LAB — GLUCOSE, CAPILLARY
GLUCOSE-CAPILLARY: 148 mg/dL — AB (ref 65–99)
GLUCOSE-CAPILLARY: 120 mg/dL — AB (ref 65–99)
Glucose-Capillary: 120 mg/dL — ABNORMAL HIGH (ref 65–99)
Glucose-Capillary: 140 mg/dL — ABNORMAL HIGH (ref 65–99)

## 2015-12-31 LAB — BASIC METABOLIC PANEL WITH GFR
Anion gap: 10 (ref 5–15)
BUN: 24 mg/dL — ABNORMAL HIGH (ref 6–20)
CO2: 43 mmol/L — ABNORMAL HIGH (ref 22–32)
Calcium: 8.6 mg/dL — ABNORMAL LOW (ref 8.9–10.3)
Chloride: 86 mmol/L — ABNORMAL LOW (ref 101–111)
Creatinine, Ser: 1.38 mg/dL — ABNORMAL HIGH (ref 0.44–1.00)
GFR calc Af Amer: 47 mL/min — ABNORMAL LOW
GFR calc non Af Amer: 40 mL/min — ABNORMAL LOW
Glucose, Bld: 100 mg/dL — ABNORMAL HIGH (ref 65–99)
Potassium: 3.4 mmol/L — ABNORMAL LOW (ref 3.5–5.1)
Sodium: 139 mmol/L (ref 135–145)

## 2015-12-31 LAB — MAGNESIUM: Magnesium: 1.7 mg/dL (ref 1.7–2.4)

## 2015-12-31 MED ORDER — FUROSEMIDE 80 MG PO TABS
80.0000 mg | ORAL_TABLET | Freq: Every day | ORAL | Status: DC
  Administered 2015-12-31 – 2016-01-02 (×3): 80 mg via ORAL
  Filled 2015-12-31 (×3): qty 1

## 2015-12-31 MED ORDER — METOPROLOL TARTRATE 50 MG PO TABS
75.0000 mg | ORAL_TABLET | Freq: Two times a day (BID) | ORAL | Status: DC
  Administered 2015-12-31 – 2016-01-02 (×4): 75 mg via ORAL
  Filled 2015-12-31 (×4): qty 1

## 2015-12-31 MED ORDER — LORATADINE 10 MG PO TABS
10.0000 mg | ORAL_TABLET | Freq: Every day | ORAL | Status: DC
  Administered 2015-12-31 – 2016-01-02 (×3): 10 mg via ORAL
  Filled 2015-12-31 (×3): qty 1

## 2015-12-31 MED ORDER — CETYLPYRIDINIUM CHLORIDE 0.05 % MT LIQD
7.0000 mL | Freq: Two times a day (BID) | OROMUCOSAL | Status: DC
  Administered 2015-12-31 – 2016-01-02 (×3): 7 mL via OROMUCOSAL

## 2015-12-31 MED ORDER — MAGNESIUM SULFATE 2 GM/50ML IV SOLN
2.0000 g | Freq: Once | INTRAVENOUS | Status: AC
  Administered 2015-12-31: 2 g via INTRAVENOUS
  Filled 2015-12-31: qty 50

## 2015-12-31 MED ORDER — POTASSIUM CHLORIDE CRYS ER 20 MEQ PO TBCR
40.0000 meq | EXTENDED_RELEASE_TABLET | Freq: Two times a day (BID) | ORAL | Status: DC
  Administered 2015-12-31 – 2016-01-02 (×5): 40 meq via ORAL
  Filled 2015-12-31 (×5): qty 2

## 2015-12-31 MED ORDER — METHADONE HCL 10 MG PO TABS
40.0000 mg | ORAL_TABLET | Freq: Every day | ORAL | Status: DC
  Administered 2015-12-31 – 2016-01-01 (×2): 40 mg via ORAL
  Filled 2015-12-31: qty 4

## 2015-12-31 NOTE — Progress Notes (Signed)
[facility] PROGRESS NOTE  [name] [id number] DOB: [birth year] DOA: 12/25/2015 PCP: Birdie Riddle, MD  Assessment/Plan: 1. SEIZURE actitivty on admission. Unclear etiology. MRI brain ordered and pending, she has refused the MRi stating she cannot lie flat for that many hours. We will d/c MRI Of the brain.  2. Hypothyroidism: resume synthroid 3. Atrial fibrillation with RVR: rate better controlled, on increased dose of metoprolol.  On cardizem po 240 mg daily and  Increased metoprolol  To 50 mg bID. ON xarelto. Amiodarone discontinued.  4. Acute hypercapnic and hypoxemic respiratory failure:  Improved and on 4 lit of [place] oxygen. Resume bronchodilators.  5. Acute on chronic diastolic heart failure: Diuresis with IV lasix, changed to po lasix today and has diuresed about 15.4 liters since admission.  Monitor renal parameters and electrolytes on lasix.  6. Group c streptococcus on cultures: on keflex to complete the course.   7. Non oliguric aki resolved  8. Hypokalemia and hypomagnesemia:  Replete and repeat in am.    9 chronic normocytic anemia: stable.   10. Non insulin dependent type 2 DM:  CBG (last 3)   Recent Labs  12/31/15 0800 12/31/15 1149 12/31/15 1650  GLUCAP 120* 148* 120*    11. Slight worsening of the renal function probably from diuresis.  Changed to oral lasix.     Code Status: FULL CODE. Family Communication: none at bedside Disposition Plan: possibly in am.  She will need home health PT and RN and new tubing for the bipap.    Consultants:  Cardiology  Pulmonology  neurology  Procedures:  EEG  MRI BRAIN pending.  1/14 ETT > 1/17  SIGNIFICANT EVENTS / STUDIES:  1/14 Found unresponsive with hypoxia and possible seizure activity requiring intubation and ativan/keppra 1/14 CT head with no acute findings  1/15 Afib requiring diltiazem drip 1/17 Extubated to bipap 1/17 EEG with slowing, no seizure activity    Antibiotics:  KEFLEX  HPI/Subjective: Reports feeling shaky,.   Objective: Filed Vitals:   12/31/15 1402 12/31/15 1414  BP: 138/94 126/81  Pulse: 98 104  Temp: 99 F (37.2 C)   Resp: 15     Intake/Output Summary (Last 24 hours) at 12/31/15 1813 Last data filed at 12/31/15 1300  Gross per 24 hour  Intake    840 ml  Output   2100 ml  Net  -1260 ml   Filed Weights   12/29/15 0500 12/30/15 0500 12/31/15 0502  Weight: 136.4 kg (300 lb 11.3 oz) 134.174 kg (295 lb 12.8 oz) 128.64 kg (283 lb 9.6 oz)    Exam:   General:  Alert afebrile comfortable on 4 liters [place] oxygen.   Cardiovascular: s1s2  Respiratory: diminished at bases, no wheezing or rhonchi  Abdomen: soft non tender non distended bowel sounds heard   Musculoskeletal: 2+ edema.   Data Reviewed: Basic Metabolic Panel:  Recent Labs Lab 12/27/15 0232 12/28/15 0239 12/29/15 0331 12/30/15 0356 12/31/15 0429  NA 145 144 141 139 139  K 3.4* 3.3* 3.0* 3.6 3.4*  CL 105 97* 87* 85* 86*  CO2 30 34* 40* 42* 43*  GLUCOSE 109* 112* 90 101* 100*  BUN 15 15 16  21* 24*  CREATININE 1.24* 1.19* 1.13* 1.25* 1.38*  CALCIUM 8.1* 8.3* 8.6* 8.8* 8.6*  MG 1.5* 2.2 1.8 1.7 1.7  PHOS  --  3.0 4.2  --   --    Liver Function Tests:  Recent Labs Lab 12/25/15 1256  AST 31  ALT 15  ALKPHOS  111  BILITOT 0.9  PROT 8.2*  ALBUMIN 3.1*   No results for input(s): LIPASE, AMYLASE in the last 168 hours. No results for input(s): AMMONIA in the last 168 hours. CBC:  Recent Labs Lab 12/25/15 1130  12/26/15 0307 12/27/15 0232 12/28/15 0239 12/29/15 0331 12/30/15 0356  WBC 14.4*  < > 9.0 7.6 8.2 8.5 9.3  NEUTROABS 11.1*  --   --   --   --   --   --   HGB 12.2  < > 10.2* 10.7* 10.2* 10.8* 11.4*  HCT 43.2  < > 35.7* 36.0 34.8* 36.2 38.1  MCV 89.4  < > 85.0 82.9 83.5 84.6 83.0  PLT 304  < > 180 150 188 223 230  < > = values in this interval not displayed. Cardiac Enzymes: No results for input(s): CKTOTAL, CKMB,  CKMBINDEX, TROPONINI in the last 168 hours. BNP (last 3 results)  Recent Labs  12/25/15 1149  BNP 369.8*    ProBNP (last 3 results) No results for input(s): PROBNP in the last 8760 hours.  CBG:  Recent Labs Lab 12/30/15 1639 12/30/15 2259 12/31/15 0800 12/31/15 1149 12/31/15 1650  GLUCAP 131* 101* 120* 148* 120*    Recent Results (from the past 240 hour(s))  Blood Culture (routine x 2)     Status: None   Collection Time: 12/25/15 11:45 AM  Result Value Ref Range Status   Specimen Description BLOOD LEFT ANTECUBITAL  Final   Special Requests IN PEDIATRIC BOTTLE 2CC  Final   Culture NO GROWTH 5 DAYS  Final   Report Status 12/30/2015 FINAL  Final  Blood Culture (routine x 2)     Status: None   Collection Time: 12/25/15 11:50 AM  Result Value Ref Range Status   Specimen Description BLOOD RIGHT ANTECUBITAL  Final   Special Requests   Final    BOTTLES DRAWN AEROBIC AND ANAEROBIC 10CC AER 5CC ANA   Culture NO GROWTH 5 DAYS  Final   Report Status 12/30/2015 FINAL  Final  Urine culture     Status: None   Collection Time: 12/25/15 12:09 PM  Result Value Ref Range Status   Specimen Description URINE, CLEAN CATCH  Final   Special Requests NONE  Final   Culture NO GROWTH 1 DAY  Final   Report Status 12/26/2015 FINAL  Final  MRSA PCR Screening     Status: None   Collection Time: 12/25/15  9:10 PM  Result Value Ref Range Status   MRSA by PCR NEGATIVE NEGATIVE Final    Comment:        The GeneXpert MRSA Assay (FDA approved for NASAL specimens only), is one component of a comprehensive MRSA colonization surveillance program. It is not intended to diagnose MRSA infection nor to guide or monitor treatment for MRSA infections.   Culture, respiratory (NON-Expectorated)     Status: None   Collection Time: 12/25/15  9:35 PM  Result Value Ref Range Status   Specimen Description TRACHEAL ASPIRATE  Final   Special Requests NONE  Final   Gram Stain   Final    FEW WBC  PRESENT,BOTH PMN AND MONONUCLEAR NO SQUAMOUS EPITHELIAL CELLS SEEN FEW GRAM NEGATIVE RODS Performed at [facility]    Culture   Final    MODERATE STREPTOCOCCUS GROUP C Note: Beta hemolytic streptococci are predictably susceptible to penicillin and other beta lactams. Susceptibility testing not routinely performed. Performed at [facility]    Report Status 12/28/2015 FINAL  Final  Studies: Dg Chest Port 1 View  12/30/2015  CLINICAL DATA:  Shortness of Breath EXAM: PORTABLE CHEST 1 VIEW COMPARISON:  December 29, 2015 FINDINGS: The previously noted mild interstitial edema has resolved. Currently there is slight atelectasis in the left base. The lungs elsewhere clear. Heart is mildly enlarged with pulmonary vascularity within normal limits. No adenopathy. No bone lesions. IMPRESSION: Mild left base atelectasis. Lungs elsewhere clear. Stable cardiac prominence. No new opacity. Electronically Signed   By: Lowella Grip III M.D.   On: 12/30/2015 08:04    Scheduled Meds: . antiseptic oral rinse  7 mL Mouth Rinse QID  . chlorhexidine gluconate  15 mL Mouth Rinse BID  . diltiazem  240 mg Oral Daily  . furosemide  80 mg Oral Daily  . insulin aspart  2-6 Units Subcutaneous TID WC  . levothyroxine  100 mcg Oral QAC breakfast  . methadone  40 mg Oral Daily  . metoprolol tartrate  75 mg Oral BID  . pantoprazole  40 mg Oral Daily  . potassium chloride SA  40 mEq Oral BID  . rivaroxaban  20 mg Oral Q supper  . sertraline  200 mg Oral Daily   Continuous Infusions:   Principal Problem:   Acute on chronic respiratory failure (HCC) Active Problems:   Hypercapnic respiratory failure, chronic (HCC)   Paroxysmal atrial fibrillation (HCC)   Aspiration pneumonia ([place])   Sepsis ([place])   Acute respiratory failure with hypoxia and hypercapnia (HCC)   Seizures ([place])    Time spent: 25 minutes    [facility] Pager [phone]. If 7PM-7AM, please contact  night-coverage at [email or web address], password [facility] 12/31/2015, 6:13 PM  LOS: 6 days

## 2015-12-31 NOTE — Care Management Note (Addendum)
Case Management Note  Patient Details  Name: [name] MRN: [id number] Date of Birth: 11/03/1956  Subjective/Objective: Pt admitted for Acute on Chronic Respiratory Failure. Intubated and Extubated- transferred to [facility]. Pt is from home with support of daughter that is at bedside. Per pt she has DME RW, Bipap and 02 via [place]. Daughter to bring 02 when ready for d/c. Per pt she is on 4L [place]. Per pt she is working with orthopedic MD to get a motorized wheelchair.                   Action/Plan: Pt is agreeable to [facility] via [facility]. CM did make referral with [facility]. SOC to begin within 24-48 hrs post d/c. No further needs at this time.    Expected Discharge Date:                  Expected Discharge Plan:  [place]  In-House Referral:  NA  Discharge planning Services  CM Consult  Post Acute Care Choice:  Home Health Choice offered to:  Patient  DME Arranged:  N/A DME Agency:  NA  HH Arranged:  RN, PT, Nurse's Aide, Social Work [employer] Agency:  [facility]  Status of Service:  Completed, signed off  Medicare Important Message Given:  Yes Date Medicare IM Given:    Medicare IM give by:    Date Additional Medicare IM Given:    Additional Medicare Important Message give by:     If discussed at [place] of Stay Meetings, dates discussed:    Additional Comments: [id number]  12-31-15 Jacqlyn Krauss, RN,BSN [phone] CM did speak with MD and she stated pt has questions in regards to Bipap tubing. CM did call [facility] to see if they could call pt in regards to needs. No further needs at this time for CM.   Bethena Roys, RN 12/31/2015, 2:13 PM

## 2015-12-31 NOTE — Progress Notes (Signed)
Ref: Birdie Riddle, MD   Subjective:  Increasing CO2 and Creatinine. Dry mouth.Afebrile.  Objective:  Vital Signs in the last 24 hours: Temp:  [97.4 F (36.3 C)-98.6 F (37 C)] 98.6 F (37 C) (01/20 0502) Pulse Rate:  [82-122] 112 (01/20 0839) Cardiac Rhythm:  [-] Atrial fibrillation;Atrial flutter (01/20 0834) Resp:  [11-21] 17 (01/20 0502) BP: (105-126)/(67-102) 118/73 mmHg (01/20 0840) SpO2:  [91 %-96 %] 96 % (01/20 0502) Weight:  [128.64 kg (283 lb 9.6 oz)] 128.64 kg (283 lb 9.6 oz) (01/20 0502)  Physical Exam: BP Readings from Last 1 Encounters:  12/31/15 118/73    Wt Readings from Last 1 Encounters:  12/31/15 128.64 kg (283 lb 9.6 oz)    Weight change: -5.534 kg (-12 lb 3.2 oz)  HEENT: [place]/AT, Eyes-Blue, PERL, EOMI, Conjunctiva-Pink, Sclera-Non-icteric Neck: No JVD, No bruit, Trachea midline. Lungs:  Clearing, Bilateral. Cardiac:  Regular rhythm, normal S1 and S2, no S3. II/VI systolic murmur. Abdomen:  Soft, non-tender. Extremities:  1 + edema present. No cyanosis. No clubbing. CNS: AxOx3, Cranial nerves grossly intact, moves all 4 extremities. Right handed. Skin: Warm and dry.   Intake/Output from previous day: 01/19 0701 - 01/20 0700 In: 720 [P.O.:720] Out: 2900 [Urine:2900]    Lab Results: BMET    Component Value Date/Time   NA 139 12/31/2015 0429   NA 139 12/30/2015 0356   NA 141 12/29/2015 0331   K 3.4* 12/31/2015 0429   K 3.6 12/30/2015 0356   K 3.0* 12/29/2015 0331   CL 86* 12/31/2015 0429   CL 85* 12/30/2015 0356   CL 87* 12/29/2015 0331   CO2 43* 12/31/2015 0429   CO2 42* 12/30/2015 0356   CO2 40* 12/29/2015 0331   GLUCOSE 100* 12/31/2015 0429   GLUCOSE 101* 12/30/2015 0356   GLUCOSE 90 12/29/2015 0331   BUN 24* 12/31/2015 0429   BUN 21* 12/30/2015 0356   BUN 16 12/29/2015 0331   CREATININE 1.38* 12/31/2015 0429   CREATININE 1.25* 12/30/2015 0356   CREATININE 1.13* 12/29/2015 0331   CALCIUM 8.6* 12/31/2015 0429   CALCIUM 8.8*  12/30/2015 0356   CALCIUM 8.6* 12/29/2015 0331   GFRNONAA 40* 12/31/2015 0429   GFRNONAA 45* 12/30/2015 0356   GFRNONAA 51* 12/29/2015 0331   GFRAA 47* 12/31/2015 0429   GFRAA 53* 12/30/2015 0356   GFRAA 60* 12/29/2015 0331   CBC    Component Value Date/Time   WBC 9.3 12/30/2015 0356   RBC 4.59 12/30/2015 0356   HGB 11.4* 12/30/2015 0356   HCT 38.1 12/30/2015 0356   PLT 230 12/30/2015 0356   MCV 83.0 12/30/2015 0356   MCH 24.8* 12/30/2015 0356   MCHC 29.9* 12/30/2015 0356   RDW 16.3* 12/30/2015 0356   LYMPHSABS 2.0 12/25/2015 1130   MONOABS 1.2* 12/25/2015 1130   EOSABS 0.0 12/25/2015 1130   BASOSABS 0.1 12/25/2015 1130   HEPATIC Function Panel  Recent Labs  12/25/15 1256  PROT 8.2*   HEMOGLOBIN A1C No components found for: HGA1C,  MPG CARDIAC ENZYMES Lab Results  Component Value Date   CKTOTAL 96 11/12/2007   CKMB 1.8 11/12/2007   TROPONINI <0.30 11/08/2014   TROPONINI <0.30 04/12/2014   TROPONINI <0.30 04/12/2014   BNP No results for input(s): PROBNP in the last 8760 hours. TSH  Recent Labs  12/29/15 0331  TSH 4.122   CHOLESTEROL No results for input(s): CHOL in the last 8760 hours.  Scheduled Meds: . antiseptic oral rinse  7 mL Mouth Rinse QID  .  chlorhexidine gluconate  15 mL Mouth Rinse BID  . diltiazem  240 mg Oral Daily  . furosemide  80 mg Oral Daily  . insulin aspart  2-6 Units Subcutaneous TID WC  . levothyroxine  100 mcg Oral QAC breakfast  . methadone  40 mg Oral Daily  . metoprolol tartrate  50 mg Oral BID  . pantoprazole  40 mg Oral Daily  . potassium chloride SA  40 mEq Oral BID  . rivaroxaban  20 mg Oral Q supper  . sertraline  200 mg Oral Daily   Continuous Infusions:  PRN Meds:.levalbuterol  Assessment/Plan: Atrial fibrillation with CHA2DS2VASc score of 4/9 Acute on chronic respiratory failure CAD Hypertension Acute on chronic diastolic heart failure DM, II Morbid obesity Obstructive sleep apnea/obesity hypoventilation  syndrome on CPAP  Chronic hypoxic/hypercarbic respiratory failure  COPD  Remote tobacco abuse  Degenerative joint disease  Hypothyroidism   Restless leg syndrome  Increase activity as tolerated.     LOS: 6 days    Dixie Dials  MD  12/31/2015, 8:50 AM

## 2016-01-01 LAB — GLUCOSE, CAPILLARY
GLUCOSE-CAPILLARY: 119 mg/dL — AB (ref 65–99)
GLUCOSE-CAPILLARY: 144 mg/dL — AB (ref 65–99)
Glucose-Capillary: 113 mg/dL — ABNORMAL HIGH (ref 65–99)
Glucose-Capillary: 150 mg/dL — ABNORMAL HIGH (ref 65–99)

## 2016-01-01 LAB — BASIC METABOLIC PANEL
Anion gap: 8 (ref 5–15)
BUN: 29 mg/dL — AB (ref 6–20)
CALCIUM: 8.5 mg/dL — AB (ref 8.9–10.3)
CO2: 40 mmol/L — AB (ref 22–32)
CREATININE: 1.47 mg/dL — AB (ref 0.44–1.00)
Chloride: 89 mmol/L — ABNORMAL LOW (ref 101–111)
GFR calc non Af Amer: 37 mL/min — ABNORMAL LOW (ref >60)
GFR, EST AFRICAN AMERICAN: 43 mL/min — AB (ref >60)
GLUCOSE: 171 mg/dL — AB (ref 65–99)
Potassium: 3.6 mmol/L (ref 3.5–5.1)
SODIUM: 137 mmol/L (ref 135–145)

## 2016-01-01 LAB — MAGNESIUM: Magnesium: 2.3 mg/dL (ref 1.7–2.4)

## 2016-01-01 MED ORDER — ACETAZOLAMIDE 250 MG PO TABS
250.0000 mg | ORAL_TABLET | Freq: Two times a day (BID) | ORAL | Status: DC
  Administered 2016-01-01 – 2016-01-02 (×2): 250 mg via ORAL
  Filled 2016-01-01 (×5): qty 1

## 2016-01-01 MED ORDER — PNEUMOCOCCAL VAC POLYVALENT 25 MCG/0.5ML IJ INJ
0.5000 mL | INJECTION | INTRAMUSCULAR | Status: AC
  Administered 2016-01-01: 0.5 mL via INTRAMUSCULAR
  Filled 2016-01-01: qty 0.5

## 2016-01-01 MED ORDER — METHADONE HCL 10 MG PO TABS: 50.0000 mg | ORAL_TABLET | Freq: Every day | ORAL | Status: DC

## 2016-01-01 MED ORDER — METOPROLOL TARTRATE 75 MG PO TABS: 75.0000 mg | ORAL_TABLET | Freq: Two times a day (BID) | ORAL | Status: DC

## 2016-01-01 MED ORDER — RIVAROXABAN 20 MG PO TABS: 20.0000 mg | ORAL_TABLET | Freq: Every day | ORAL | Status: DC

## 2016-01-01 MED ORDER — LEVOTHYROXINE SODIUM 100 MCG PO TABS: 100.0000 ug | ORAL_TABLET | Freq: Every day | ORAL | Status: DC

## 2016-01-01 MED ORDER — FUROSEMIDE 80 MG PO TABS: 80.0000 mg | ORAL_TABLET | Freq: Every day | ORAL | Status: DC

## 2016-01-01 MED ORDER — ACETAZOLAMIDE SODIUM 500 MG IJ SOLR
250.0000 mg | Freq: Once | INTRAMUSCULAR | Status: AC
  Administered 2016-01-01: 250 mg via INTRAVENOUS
  Filled 2016-01-01: qty 500

## 2016-01-01 MED ORDER — METHADONE HCL 10 MG PO TABS
60.0000 mg | ORAL_TABLET | Freq: Every day | ORAL | Status: DC
  Administered 2016-01-02: 60 mg via ORAL
  Filled 2016-01-01: qty 6

## 2016-01-01 NOTE — Progress Notes (Signed)
Foley catheter removed at 11am. Pt is due to void. MD has asked for pt walk. Pt has refused stated she has been mostly in wheelchair, but will try to ambulate to the [facility] later today.

## 2016-01-01 NOTE — Plan of Care (Signed)
Problem: Cardiac: Goal: Ability to achieve and maintain adequate cardiopulmonary perfusion will improve Outcome: Completed/Met Date Met:  01/01/16 Patient progressing on-target for discharge related to heart failure pathway. Continuing to diurese on oral furosemide with indwelling Foley catheter in situ. Respiratory issues controlled status-post extubation on supplemental oxygen per nasal canula at 4 L/m. Patient remains in atrial fibrillation with controlled rate; taking oral xarelto, diltiazem and metoprolol. No new complaints of pain; chronic pain at tolerance level with currently prescribed medication regimen. Participating in therapies.  Recent vital signs: Filed Vitals:    12/31/15 1900 12/31/15 1952 12/31/15 2100 12/31/15 2112  BP: 108/71   116/68    Pulse: 96   87    Temp:   98 F (36.7 C)   98.9 F (37.2 C)  TempSrc:   Oral      Resp: 18   11    Weight:          SpO2: 96%   98%      There are currently no medical barriers to discharge. However, there are serious family dynamic issues present: Mrs. Stennis feels overwhelmed by the presence of her grandson, who currently resides in her home. This evening, while he was visiting the patient with his mother, he was heard screaming at her from outside the room at the nurse's station. When I responded to the bedside to ascertain the situation, his mother quickly removed him from the room in tears. He appeared to be under the influence of illicit drugs or alcohol at that time.  After he had left, I discussed the patient's home situation with her. She stated that she and her daughter are trying to find a better solution, but that they need help. Her grandson has several poorly-managed behavioral health diagnoses that are adversely affecting the home environment. He is frequently verbally abusive, can become violent at times, often brings illicit drugs, alcohol and strangers into the home at late hours, and has destroyed the patient's  property. Mrs. Wiegand relates that she and her daughter have tried many times to get him help with no success, and that he does not take his prescribed medications. She states "there isn't a wall in my house that hasn't had a hole punched through it." She is also concerned about the welfare of younger children currently living in the home.  Social work consult initiated to assist with home environment situation.  Continuing to closely monitor.

## 2016-01-01 NOTE — Progress Notes (Signed)
Subjective:  Patient denies they any chest pain states breathing and leg swelling has improved  Objective:  Vital Signs in the last 24 hours: Temp:  [97.8 F (36.6 C)-99 F (37.2 C)] 98.1 F (36.7 C) (01/21 0814) Pulse Rate:  [78-162] 78 (01/21 1100) Resp:  [11-20] 16 (01/21 1100) BP: (108-138)/(68-99) 128/99 mmHg (01/21 1100) SpO2:  [94 %-99 %] 95 % (01/21 1100) Weight:  [133.494 kg (294 lb 4.8 oz)] 133.494 kg (294 lb 4.8 oz) (01/21 0639)  Intake/Output from previous day: 01/20 0701 - 01/21 0700 In: 1240 [P.O.:1240] Out: 975 [Urine:975] Intake/Output from this shift: Total I/O In: 240 [P.O.:240] Out: 750 [Urine:750]  Physical Exam: Neck: no adenopathy, no carotid bruit, no JVD and supple, symmetrical, trachea midline Lungs: Decrease pressor at bases Heart: irregularly irregular rhythm, S1, S2 normal and Soft systolic murmur noted Abdomen: soft, non-tender; bowel sounds normal; no masses,  no organomegaly Extremities: No clubbing cyanosis 1+ edema noted  Lab Results:  Recent Labs  12/30/15 0356  WBC 9.3  HGB 11.4*  PLT 230    Recent Labs  12/31/15 0429 01/01/16 0432  NA 139 137  K 3.4* 3.6  CL 86* 89*  CO2 43* 40*  GLUCOSE 100* 171*  BUN 24* 29*  CREATININE 1.38* 1.47*   No results for input(s): TROPONINI in the last 72 hours.  Invalid input(s): CK, MB Hepatic Function Panel No results for input(s): PROT, ALBUMIN, AST, ALT, ALKPHOS, BILITOT, BILIDIR, IBILI in the last 72 hours. No results for input(s): CHOL in the last 72 hours. No results for input(s): PROTIME in the last 72 hours.  Imaging:   Cardiac Studies:   Assessment/Plan: Atrial fibrillation with CHA2DS2VASc score of 4/9 Acute on chronic respiratory failure CAD Hypertension Acute on chronic diastolic heart failure DM, II Morbid obesity ObstructMetabolic alkalosis ive sleep apnea/obesity hypoventilation syndrome on CPAP  Chronic hypoxic/hypercarbic respiratory failure  COPD   Remote tobacco abuse  Degenerative joint disease  Hypothyroidism   Restless leg syndrome Metabolic alkalosis Plan Add Diamox as per orders Increase ambulation  LOS: 7 days    [name] 01/01/2016, 12:44 PM

## 2016-01-01 NOTE — Progress Notes (Signed)
[facility] PROGRESS NOTE  [name] [id number] DOB: Apr 15, 1956 DOA: 12/25/2015 PCP: Birdie Riddle, MD  Assessment/Plan: 1. SEIZURE actitivty on admission. Unclear etiology. MRI brain ordered and pending, she has refused the MRi stating she cannot lie flat for that many hours. We will d/c MRI Of the brain.  2. Hypothyroidism: resume synthroid 3. Atrial fibrillation with RVR: rate better controlled, on increased dose of metoprolol.  On cardizem po 240 mg daily and  Increased metoprolol  To 50 mg bID. ON xarelto. Amiodarone discontinued.  4. Acute hypercapnic and hypoxemic respiratory failure:  Improved and on 4 lit of [place] oxygen. Resume bronchodilators.  5. Acute on chronic diastolic heart failure: Diuresis with IV lasix, changed to po lasix today and has diuresed about 15.4 liters since admission.  Monitor renal parameters and electrolytes on lasix.  6. Group c streptococcus on cultures: on keflex to complete the course.   7. Non oliguric aki resolved  8. Hypokalemia and hypomagnesemia:  Replete and repeat in am.    9 chronic normocytic anemia: stable.   10. Non insulin dependent type 2 DM:  CBG (last 3)   Recent Labs  01/01/16 0745 01/01/16 1215 01/01/16 1703  GLUCAP 113* 150* 119*    11. Slight worsening of the renal function probably from diuresis.  Changed to oral lasix.   12. Metabolic alkalosis: - diamox added as per cardiology recommendations.  - repeat bmp in am.     Code Status: FULL CODE. Family Communication: none at bedside Disposition Plan: possibly in am.  She will need home health PT and RN and new tubing for the bipap.    Consultants:  Cardiology  Pulmonology  neurology  Procedures:  EEG  MRI BRAIN pending.  1/14 ETT > 1/17  SIGNIFICANT EVENTS / STUDIES:  1/14 Found unresponsive with hypoxia and possible seizure activity requiring intubation and ativan/keppra 1/14 CT head with no acute findings  1/15 Afib requiring  diltiazem drip 1/17 Extubated to bipap 1/17 EEG with slowing, no seizure activity   Antibiotics:  KEFLEX  HPI/Subjective: Reports feeling shaky,.   Objective: Filed Vitals:   01/01/16 1700 01/01/16 1800  BP: 129/73 113/87  Pulse: 49 88  Temp:    Resp: 18 21    Intake/Output Summary (Last 24 hours) at 01/01/16 1923 Last data filed at 01/01/16 1858  Gross per 24 hour  Intake    700 ml  Output   1375 ml  Net   -675 ml   Filed Weights   12/30/15 0500 12/31/15 0502 01/01/16 0639  Weight: 134.174 kg (295 lb 12.8 oz) 128.64 kg (283 lb 9.6 oz) 133.494 kg (294 lb 4.8 oz)    Exam:   General:  Alert afebrile comfortable on 4 liters  oxygen.   Cardiovascular: s1s2  Respiratory: diminished at bases, no wheezing or rhonchi  Abdomen: soft non tender non distended bowel sounds heard   Musculoskeletal: 2+ edema.   Data Reviewed: Basic Metabolic Panel:  Recent Labs Lab 12/28/15 0239 12/29/15 0331 12/30/15 0356 12/31/15 0429 01/01/16 0432  NA 144 141 139 139 137  K 3.3* 3.0* 3.6 3.4* 3.6  CL 97* 87* 85* 86* 89*  CO2 34* 40* 42* 43* 40*  GLUCOSE 112* 90 101* 100* 171*  BUN 15 16 21* 24* 29*  CREATININE 1.19* 1.13* 1.25* 1.38* 1.47*  CALCIUM 8.3* 8.6* 8.8* 8.6* 8.5*  MG 2.2 1.8 1.7 1.7 2.3  PHOS 3.0 4.2  --   --   --    Liver  Function Tests: No results for input(s): AST, ALT, ALKPHOS, BILITOT, PROT, ALBUMIN in the last 168 hours. No results for input(s): LIPASE, AMYLASE in the last 168 hours. No results for input(s): AMMONIA in the last 168 hours. CBC:  Recent Labs Lab 12/26/15 0307 12/27/15 0232 12/28/15 0239 12/29/15 0331 12/30/15 0356  WBC 9.0 7.6 8.2 8.5 9.3  HGB 10.2* 10.7* 10.2* 10.8* 11.4*  HCT 35.7* 36.0 34.8* 36.2 38.1  MCV 85.0 82.9 83.5 84.6 83.0  PLT 180 150 188 223 230   Cardiac Enzymes: No results for input(s): CKTOTAL, CKMB, CKMBINDEX, TROPONINI in the last 168 hours. BNP (last 3 results)  Recent Labs  12/25/15 1149  BNP 369.8*     ProBNP (last 3 results) No results for input(s): PROBNP in the last 8760 hours.  CBG:  Recent Labs Lab 12/31/15 1650 12/31/15 2151 01/01/16 0745 01/01/16 1215 01/01/16 1703  GLUCAP 120* 140* 113* 150* 119*    Recent Results (from the past 240 hour(s))  Blood Culture (routine x 2)     Status: None   Collection Time: 12/25/15 11:45 AM  Result Value Ref Range Status   Specimen Description BLOOD LEFT ANTECUBITAL  Final   Special Requests IN PEDIATRIC BOTTLE 2CC  Final   Culture NO GROWTH 5 DAYS  Final   Report Status 12/30/2015 FINAL  Final  Blood Culture (routine x 2)     Status: None   Collection Time: 12/25/15 11:50 AM  Result Value Ref Range Status   Specimen Description BLOOD RIGHT ANTECUBITAL  Final   Special Requests   Final    BOTTLES DRAWN AEROBIC AND ANAEROBIC 10CC AER 5CC ANA   Culture NO GROWTH 5 DAYS  Final   Report Status 12/30/2015 FINAL  Final  Urine culture     Status: None   Collection Time: 12/25/15 12:09 PM  Result Value Ref Range Status   Specimen Description URINE, CLEAN CATCH  Final   Special Requests NONE  Final   Culture NO GROWTH 1 DAY  Final   Report Status 12/26/2015 FINAL  Final  MRSA PCR Screening     Status: None   Collection Time: 12/25/15  9:10 PM  Result Value Ref Range Status   MRSA by PCR NEGATIVE NEGATIVE Final    Comment:        The GeneXpert MRSA Assay (FDA approved for NASAL specimens only), is one component of a comprehensive MRSA colonization surveillance program. It is not intended to diagnose MRSA infection nor to guide or monitor treatment for MRSA infections.   Culture, respiratory (NON-Expectorated)     Status: None   Collection Time: 12/25/15  9:35 PM  Result Value Ref Range Status   Specimen Description TRACHEAL ASPIRATE  Final   Special Requests NONE  Final   Gram Stain   Final    FEW WBC PRESENT,BOTH PMN AND MONONUCLEAR NO SQUAMOUS EPITHELIAL CELLS SEEN FEW GRAM NEGATIVE RODS Performed at [facility]    Culture   Final    MODERATE STREPTOCOCCUS GROUP C Note: Beta hemolytic streptococci are predictably susceptible to penicillin and other beta lactams. Susceptibility testing not routinely performed. Performed at [facility]    Report Status 12/28/2015 FINAL  Final     Studies: No results found.  Scheduled Meds: . acetaZOLAMIDE  250 mg Oral BID  . antiseptic oral rinse  7 mL Mouth Rinse BID  . diltiazem  240 mg Oral Daily  . furosemide  80 mg Oral Daily  . insulin  aspart  2-6 Units Subcutaneous TID WC  . levothyroxine  100 mcg Oral QAC breakfast  . loratadine  10 mg Oral Daily  . [START ON 01/02/2016] methadone  60 mg Oral Daily  . metoprolol tartrate  75 mg Oral BID  . pantoprazole  40 mg Oral Daily  . potassium chloride SA  40 mEq Oral BID  . rivaroxaban  20 mg Oral Q supper  . sertraline  200 mg Oral Daily   Continuous Infusions:   Principal Problem:   Acute on chronic respiratory failure (HCC) Active Problems:   Hypercapnic respiratory failure, chronic (HCC)   Paroxysmal atrial fibrillation (HCC)   Aspiration pneumonia ([place])   Sepsis ([place])   Acute respiratory failure with hypoxia and hypercapnia (HCC)   Seizures ([place])    Time spent: 25 minutes    [facility] Pager [phone]. If 7PM-7AM, please contact night-coverage at [email or web address], password [facility] 01/01/2016, 7:23 PM  LOS: 7 days

## 2016-01-01 NOTE — Clinical Social Work Note (Signed)
Clinical Social Work Assessment  Patient Details  Name: [name] MRN: [id number] Date of Birth: 1956/04/03  Date of referral:  01/01/16               Reason for consult:  Abuse/Neglect                Permission sought to share information with:  Family Supports Permission granted to share information::  Yes, Verbal Permission Granted  Name::     Astronomer::     Relationship::  Programmer, applications Information:     Housing/Transportation Living arrangements for the past 2 months:  Single Family Home Source of Information:  Patient Patient Interpreter Needed:  None Criminal Activity/Legal Involvement Pertinent to Current Situation/Hospitalization:  No - Comment as needed Significant Relationships:  Adult Children Lives with:  Adult Children, Relatives Do you feel safe going back to the place where you live?  Yes Need for family participation in patient care:  No (Coment)  Care giving concerns:  Pt lives with dtr and grandchildren. Pt reports family will provide assistance and support when pt returns home.   Social Worker assessment / plan:  CSW met with pt in room. Granddtr joined for part of assessment and pt was agreeable.  Pt lives at home with dtr and 4 grandchildren (aged 78, 75, 24 and 27). Pt's husband passed away about 3 years ago and her health started declining. Pt had lived with another dtr previously but feels that her relationship with this dtr and living arrangement is better. Pt's grandson (38 yo) moved in about 1 year ago and it was supposed to be temporary but he still has not left. Pt denies any DV but reports that he has anger issues and will punch the wall or yell. Pt raised grandson and feels guilty about asking him to move out. CSW reminded pt that she needed to be mindful of her own health and ensure her safety. Pt does not have any concerns with returning home and reports no fear of grandson, but states that he contributes to her stress and depression.    Pt currently attends [facility] and meets with psychiatrist for medication management. Pt reports passive SI over a year ago but denies any current SI. Pt reports that if she felt suicidal she would call friends or family or go to ED, call 911 or crisis center if she felt unsafe. Pt stated that she feels depression is being managed well. 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 Pt speaks with a therapist almost weekly and feels this is beneficial. Pt agreeable to speak with therapist to create better boundaries and to create a plan for grandson to be able to move out of the house.  CSW is signing off due to pt having adequate resources and denies any safety concerns.   Employment status:  Disabled (Comment on whether or not currently receiving Disability) Insurance information:  Medicare, Medicaid In [place] PT Recommendations:  Home with [place] / Referral to community resources:  Outpatient Psychiatric Care (Comment Required)  Patient/Family's Response to care:  Patient alert and oriented and engaged during assessment.  Patient/Family's Understanding of and Emotional Response to Diagnosis, Current Treatment, and Prognosis:  Pt reports chronic breathing problems and is happy to be stable. Pt feels family provides adequate support and plans to DC home. Pt is aware of community resources and has appropriate follow up at DC.  Emotional Assessment Appearance:  Appears older than stated age  Attitude/Demeanor/Rapport:  Other (Cooperative) Affect (typically observed):  Accepting, Appropriate Orientation:  Oriented to Self, Oriented to Place, Oriented to  Time, Oriented to Situation Alcohol / Substance use:  Not Applicable Psych involvement (Current and /or in the community):  No (Comment)  Discharge Needs  Concerns to be addressed:  No discharge needs identified Readmission within the last 30 days:  No Current discharge risk:  None Barriers to Discharge:   No Barriers Identified   Boone Master, [place] 01/01/2016, 4:28 PM Weekend coverage

## 2016-01-01 NOTE — Progress Notes (Signed)
Pt refuses NIV, pt states doesn't tolerating hospital provided mask well.

## 2016-01-01 NOTE — Progress Notes (Signed)
Pt has voided post foley catheter, with 1 person assist to the bedside commode. SW has also consulted with pt. Pt is progressing will, MD has ordered Diamox, first dose will be given today. VSS will continue to monitor pt

## 2016-01-02 DIAGNOSIS — A419 Sepsis, unspecified organism: Secondary | ICD-10-CM | POA: Diagnosis not present

## 2016-01-02 LAB — GLUCOSE, CAPILLARY
GLUCOSE-CAPILLARY: 146 mg/dL — AB (ref 65–99)
GLUCOSE-CAPILLARY: 108 mg/dL — AB (ref 65–99)
Glucose-Capillary: 138 mg/dL — ABNORMAL HIGH (ref 65–99)

## 2016-01-02 LAB — BASIC METABOLIC PANEL
ANION GAP: 8 (ref 5–15)
BUN: 27 mg/dL — ABNORMAL HIGH (ref 6–20)
CALCIUM: 8.9 mg/dL (ref 8.9–10.3)
CO2: 37 mmol/L — ABNORMAL HIGH (ref 22–32)
Chloride: 94 mmol/L — ABNORMAL LOW (ref 101–111)
Creatinine, Ser: 1.31 mg/dL — ABNORMAL HIGH (ref 0.44–1.00)
GFR, EST AFRICAN AMERICAN: 50 mL/min — AB (ref >60)
GFR, EST NON AFRICAN AMERICAN: 43 mL/min — AB (ref >60)
Glucose, Bld: 112 mg/dL — ABNORMAL HIGH (ref 65–99)
POTASSIUM: 4.4 mmol/L (ref 3.5–5.1)
SODIUM: 139 mmol/L (ref 135–145)

## 2016-01-02 MED ORDER — ACETAZOLAMIDE 250 MG PO TABS: 250.0000 mg | ORAL_TABLET | Freq: Two times a day (BID) | ORAL | Status: DC

## 2016-01-02 MED ORDER — LEVOTHYROXINE SODIUM 100 MCG PO TABS: 100.0000 ug | ORAL_TABLET | Freq: Every day | ORAL | Status: DC

## 2016-01-02 MED ORDER — FUROSEMIDE 80 MG PO TABS: 80.0000 mg | ORAL_TABLET | Freq: Every day | ORAL | Status: DC

## 2016-01-02 MED ORDER — RIVAROXABAN 20 MG PO TABS: 20.0000 mg | ORAL_TABLET | Freq: Every day | ORAL | Status: DC

## 2016-01-02 MED ORDER — METOPROLOL TARTRATE 75 MG PO TABS: 75.0000 mg | ORAL_TABLET | Freq: Two times a day (BID) | ORAL | Status: DC

## 2016-01-02 NOTE — Progress Notes (Signed)
Report received in patient's room via [name] RN using [employer], reviewed chart, orders, labs,VS, meds, tests and patient's general condition, assumed care of patient.

## 2016-01-02 NOTE — Progress Notes (Signed)
Subjective:  Denies any chest pain or shortness of breath eager  to go home  Objective:  Vital Signs in the last 24 hours: Temp:  [97.8 F (36.6 C)-98.2 F (36.8 C)] 97.8 F (36.6 C) (01/22 0646) Pulse Rate:  [49-96] 79 (01/22 1100) Resp:  [9-24] 9 (01/22 1100) BP: (99-129)/(65-90) 101/73 mmHg (01/22 1100) SpO2:  [86 %-100 %] 97 % (01/22 1100) Weight:  [133.584 kg (294 lb 8 oz)] 133.584 kg (294 lb 8 oz) (01/22 0646)  Intake/Output from previous day: 01/21 0701 - 01/22 0700 In: 940 [P.O.:940] Out: 2100 [Urine:2100] Intake/Output from this shift: Total I/O In: 240 [P.O.:240] Out: -   Physical Exam: Neck: no adenopathy, no carotid bruit, no JVD and supple, symmetrical, trachea midline Lungs: clear to auscultation bilaterally Heart: irregularly irregular rhythm, S1, S2 normal and Soft systolic murmur noted Abdomen: soft, non-tender; bowel sounds normal; no masses,  no organomegaly Extremities: extremities normal, atraumatic, no cyanosis or edema  Lab Results: No results for input(s): WBC, HGB, PLT in the last 72 hours.  Recent Labs  01/01/16 0432 01/02/16 0504  NA 137 139  K 3.6 4.4  CL 89* 94*  CO2 40* 37*  GLUCOSE 171* 112*  BUN 29* 27*  CREATININE 1.47* 1.31*   No results for input(s): TROPONINI in the last 72 hours.  Invalid input(s): CK, MB Hepatic Function Panel No results for input(s): PROT, ALBUMIN, AST, ALT, ALKPHOS, BILITOT, BILIDIR, IBILI in the last 72 hours. No results for input(s): CHOL in the last 72 hours. No results for input(s): PROTIME in the last 72 hours.  Imaging: Imaging results have been reviewed and No results found.  Cardiac Studies:  Assessment/Plan:  Atrial fibrillation with [id number] score of 4/9 Acute on chronic respiratory failure CAD Hypertension Acute on chronic diastolic heart failure DM, II Morbid obesity ObstructMetabolic alkalosis ive sleep apnea/obesity hypoventilation syndrome on CPAP  Chronic  hypoxic/hypercarbic respiratory failure  COPD  Remote tobacco abuse  Degenerative joint disease  Hypothyroidism   Restless leg syndrome Metabolic alkalosis improved Plan Continue present management Okay to discharge from cardiac point of view Follow-up with  Dr. Doylene Canard in one week  LOS: 8 days    Charolette Forward 01/02/2016, 11:14 AM

## 2016-01-02 NOTE — Progress Notes (Signed)
Pt IV removed. Pt will get dressed. All new medications have been reviewed. All discharge instructions have been reviewed. Pt understands to F/U with appointment, and HH has been set up. Pt has been given unit number for any follow up questions regarding discharge. Etta Quill, RN

## 2016-01-02 NOTE — Progress Notes (Signed)
Pt states she believes she should stay on Keflex or an antibiotic for a long period of time. States she will speak to MD about it, RN to follow new medication orders

## 2016-01-02 NOTE — Discharge Summary (Signed)
Physician Discharge Summary  [name] [id number] DOB: 21-Mar-1956 DOA: 12/25/2015  PCP: Birdie Riddle, MD  Admit date: 12/25/2015 Discharge date: 01/02/2016  Time spent: 30 minutes  Recommendations for Outpatient Follow-up:  1. Follow up with cardiology in one week. 2. Follow up with PCP in one week.     Discharge Diagnoses:  Principal Problem:   Acute on chronic respiratory failure (HCC) Active Problems:   Hypercapnic respiratory failure, chronic (HCC)   Paroxysmal atrial fibrillation (HCC)   Aspiration pneumonia (HCC)   Sepsis ([place])   Acute respiratory failure with hypoxia and hypercapnia (HCC)   Seizures ([place])   Discharge Condition: improved  Diet recommendation: low sodium diet.   Filed Weights   12/31/15 0502 01/01/16 0639 01/02/16 0646  Weight: 128.64 kg (283 lb 9.6 oz) 133.494 kg (294 lb 4.8 oz) 133.584 kg (294 lb 8 oz)    History of present illness:  60 yo morbidly obese woman, Hx OSA / OHS / RLS (followed VS), DM, HTN, chronic resp failure, admitted for obtundation and acute on chronic respiratory failure.   Hospital Course:  1. SEIZURE actitivty on admission. Unclear etiology. MRI brain ordered and pending, she has refused the MRi stating she cannot lie flat for that many hours. We will d/c MRI Of the brain.  2. Hypothyroidism: resume synthroid 3. Atrial fibrillation with RVR: rate better controlled, on increased dose of metoprolol. On cardizem po 240 mg daily and Increased metoprolol To 50 mg bID. ON xarelto. Amiodarone discontinued.  4. Acute hypercapnic and hypoxemic respiratory failure: Improved and on 3 lit of [place] oxygen. Resume bronchodilators.  5. Acute on chronic diastolic heart failure: Diuresis with IV lasix, changed to po lasix  and has diuresed about 15.4 liters since admission.   6. Group c streptococcus on cultures: on keflex to complete the course.   7. Non oliguric aki resolved  8. Hypokalemia and hypomagnesemia: Replete and  repeat in am shows improvement.   9 chronic normocytic anemia: stable.   10. Non insulin dependent type 2 DM:  CBG (last 3)   Recent Labs (last 2 labs)      Recent Labs  01/01/16 0745 01/01/16 1215 01/01/16 1703  GLUCAP 113* 150* 119*      11. Slight worsening of the renal function probably from diuresis.  Changed to oral lasix.   12. Metabolic alkalosis: - diamox added as per cardiology recommendations.  - repeat bmp in am shows improvement.          Procedures:  echo  Consultations:  Cardiology  Pulmonary.   Discharge Exam: Filed Vitals:   01/02/16 1100 01/02/16 1200  BP: 101/73 121/83  Pulse: 79 80  Temp:    Resp: 9 16    General: alert afebrile comfortable.  Cardiovascular: s1s2 Respiratory: ctab  Discharge Instructions   Discharge Instructions    (HEART FAILURE PATIENTS) Call MD:  Anytime you have any of the following symptoms: 1) 3 pound weight gain in 24 hours or 5 pounds in 1 week 2) shortness of breath, with or without a dry hacking cough 3) swelling in the hands, feet or stomach 4) if you have to sleep on extra pillows at night in order to breathe.    Complete by:  As directed      Call MD for:  persistant dizziness or light-headedness    Complete by:  As directed      Diet - low sodium heart healthy    Complete by:  As directed  Discharge instructions    Complete by:  As directed   Follow up with PCP in one week. Follow up with home health PT, RN,          Current Discharge Medication List    START taking these medications   Details  acetaZOLAMIDE (DIAMOX) 250 MG tablet Take 1 tablet (250 mg total) by mouth 2 (two) times daily. Qty: 14 tablet, Refills: 0    Metoprolol Tartrate 75 MG TABS Take 75 mg by mouth 2 (two) times daily. Qty: 60 tablet, Refills: 0    rivaroxaban (XARELTO) 20 MG TABS tablet Take 1 tablet (20 mg total) by mouth daily with supper. Qty: 30 tablet, Refills: 1      CONTINUE these  medications which have CHANGED   Details  furosemide (LASIX) 80 MG tablet Take 1 tablet (80 mg total) by mouth daily. Qty: 30 tablet, Refills: 0    levothyroxine (SYNTHROID, LEVOTHROID) 100 MCG tablet Take 1 tablet (100 mcg total) by mouth daily before breakfast. Qty: 30 tablet, Refills: 0      CONTINUE these medications which have NOT CHANGED   Details  b complex vitamins tablet Take 1 tablet by mouth daily.     BIOTIN PO Take 1 tablet by mouth 2 (two) times daily.    cetirizine (ZYRTEC) 10 MG tablet Take 10 mg by mouth at bedtime.    diltiazem (DILACOR XR) 240 MG 24 hr capsule Take 1 capsule (240 mg total) by mouth daily. Qty: 30 capsule, Refills: 1    methadone (DOLOPHINE) 10 MG/5ML solution Take 70 mg by mouth every morning.     nystatin (MYCOSTATIN/NYSTOP) 100000 UNIT/GM POWD Apply 1 g topically 2 (two) times daily as needed (for rash).    pantoprazole (PROTONIX) 40 MG tablet Take 40 mg by mouth daily. Refills: 6    potassium chloride SA (K-DUR,KLOR-CON) 20 MEQ tablet Take 20 mEq by mouth 2 (two) times daily.    prochlorperazine (COMPAZINE) 10 MG tablet Take 10 mg by mouth every 6 (six) hours as needed for nausea or vomiting.    sertraline (ZOLOFT) 100 MG tablet Take 200 mg by mouth at bedtime.     clotrimazole-betamethasone (LOTRISONE) cream Apply 1 application topically 2 (two) times daily.      STOP taking these medications     azithromycin (ZITHROMAX) 250 MG tablet      losartan (COZAAR) 100 MG tablet      methylphenidate 36 MG PO CR tablet      propranolol (INDERAL) 40 MG tablet        Allergies  Allergen Reactions  . Aspirin Other (See Comments)    Chest pain  . Eliquis [Apixaban]     Excessive bleeding  . Nsaids Other (See Comments)    Chest pain  . Other Other (See Comments)    "BP med" combined with plavix, caused syncope  . Plavix [Clopidogrel] Other (See Comments)    syncope  . Sudafed [Pseudoephedrine] Palpitations    Raised BP  .  Erythromycin Nausea And Vomiting  . Sulfa Antibiotics Other (See Comments)    constipation  . Sulfamethoxazole-Trimethoprim Diarrhea  . Altace [Ramipril] Other (See Comments)    Severe fatigue  . Zofran [Ondansetron Hcl] Other (See Comments)    "I put me into a coma"   Follow-up Information    Follow up with [facility].   Why:  Registered Nurse, Physical Therapy, Aide and Brewing technologist information:   [address] [place]  [place] [address] [phone]       Follow up with [facility] S, MD. Schedule an appointment as soon as possible for a visit in 1 week.   Specialty:  Cardiology   Contact information:   [address] [place] [address] [phone]        The results of significant diagnostics from this hospitalization (including imaging, microbiology, ancillary and laboratory) are listed below for reference.    Significant Diagnostic Studies: Dg Chest 2 View  12/23/2015  CLINICAL DATA:  Cough and short of breath EXAM: CHEST  2 VIEW COMPARISON:  11/08/2014 FINDINGS: Heart size upper normal. Mild vascular congestion. Negative for edema or effusion. Negative for infiltrate or effusion. Negative for lung mass. IMPRESSION: Mild vascular congestion without edema. Overall improvement since the prior study. Electronically Signed   By: Franchot Gallo M.D.   On: 12/23/2015 11:58   Ct Head Wo Contrast  12/25/2015  CLINICAL DATA:  Short of breath, cough, unresponsive. EXAM: CT HEAD WITHOUT CONTRAST TECHNIQUE: Contiguous axial images were obtained from the base of the skull through the vertex without intravenous contrast. COMPARISON:  None. FINDINGS: Patient intubated. No acute intracranial hemorrhage. No focal mass lesion. No CT evidence of acute infarction. No midline shift or mass effect. No hydrocephalus. Basilar cisterns are patent. Paranasal sinuses and  mastoid air cells are clear. IMPRESSION: 1. No acute intracranial findings. 2. Intubated  patient. Electronically Signed   By: Suzy Bouchard M.D.   On: 12/25/2015 12:34   Dg Chest Port 1 View  12/30/2015  CLINICAL DATA:  Shortness of Breath EXAM: PORTABLE CHEST 1 VIEW COMPARISON:  December 29, 2015 FINDINGS: The previously noted mild interstitial edema has resolved. Currently there is slight atelectasis in the left base. The lungs elsewhere clear. Heart is mildly enlarged with pulmonary vascularity within normal limits. No adenopathy. No bone lesions. IMPRESSION: Mild left base atelectasis. Lungs elsewhere clear. Stable cardiac prominence. No new opacity. Electronically Signed   By: Lowella Grip III M.D.   On: 12/30/2015 08:04   Dg Chest Port 1 View  12/29/2015  CLINICAL DATA:  Hypoxia today. EXAM: PORTABLE CHEST 1 VIEW COMPARISON:  12/29/2015 at 501 hours. FINDINGS: Mildly degraded exam due to AP portable technique and patient body habitus. Right hemidiaphragm elevation. Midline trachea. Cardiomegaly accentuated by AP portable technique. Atherosclerosis in the transverse aorta. No definite pleural fluid. The Chin overlies the apices. No pneumothorax. Development of mild interstitial edema, accentuated by AP portable technique. Persistent bibasilar airspace disease. IMPRESSION: Cardiomegaly with low lung volumes and development of interstitial edema. Similar bibasilar Airspace disease, likely atelectasis. Decreased sensitivity and specificity exam due to technique related factors, as described above. Electronically Signed   By: Abigail Miyamoto M.D.   On: 12/29/2015 12:13   Dg Chest Port 1 View  12/29/2015  CLINICAL DATA:  Shortness of Breath EXAM: PORTABLE CHEST 1 VIEW COMPARISON:  December 27, 2015 FINDINGS: There is mild bibasilar atelectatic change. There is no edema or consolidation. Heart is borderline enlarged with pulmonary vascularity within normal limits. No adenopathy. No pneumothorax. There is degenerative change in the thoracic spine. IMPRESSION: Bibasilar atelectasis. No frank  edema or consolidation. Borderline cardiac prominence. Electronically Signed   By: Lowella Grip III M.D.   On: 12/29/2015 07:56   Dg Chest Port 1 View  12/27/2015  CLINICAL DATA:  Intubation, hypertension, CHF, diabetes mellitus, coronary artery disease history endometrial cancer, former smoker EXAM: PORTABLE CHEST 1 VIEW COMPARISON:  Portable exam 0623 hours compared to 12/25/2015  FINDINGS: Tip of endotracheal tube projects 1.8 cm above carina. Nasogastric tube extends into stomach. Enlargement of cardiac silhouette. Mediastinal contour stable. Diffuse pulmonary infiltrates again identified, greater at lung bases, could represent edema or infection, little changed. No gross pleural effusion or pneumothorax. Bones demineralized. IMPRESSION: Persistent pulmonary infiltrates question edema versus infection. Enlargement of cardiac silhouette. Electronically Signed   By: Lavonia Dana M.D.   On: 12/27/2015 08:00   Dg Chest Port 1 View  12/25/2015  CLINICAL DATA:  Evaluate endotracheal tube placement. Seizure activity. EXAM: PORTABLE CHEST 1 VIEW COMPARISON:  12/23/2015 FINDINGS: Endotracheal tube positioned 3.4 cm from carina. NG tube extends the stomach. Normal cardiac silhouette. There is of the increased central venous congestion. Air bronchograms in the LEFT lower lobe. No pneumothorax IMPRESSION: 1. Endotracheal tube and NG tube appear in good position. 2. Increased perihilar airspace disease representing edema versus aspiration. Electronically Signed   By: Suzy Bouchard M.D.   On: 12/25/2015 13:14    Microbiology: Recent Results (from the past 240 hour(s))  Blood Culture (routine x 2)     Status: None   Collection Time: 12/25/15 11:45 AM  Result Value Ref Range Status   Specimen Description BLOOD LEFT ANTECUBITAL  Final   Special Requests IN PEDIATRIC BOTTLE 2CC  Final   Culture NO GROWTH 5 DAYS  Final   Report Status 12/30/2015 FINAL  Final  Blood Culture (routine x 2)     Status: None    Collection Time: 12/25/15 11:50 AM  Result Value Ref Range Status   Specimen Description BLOOD RIGHT ANTECUBITAL  Final   Special Requests   Final    BOTTLES DRAWN AEROBIC AND ANAEROBIC 10CC AER 5CC ANA   Culture NO GROWTH 5 DAYS  Final   Report Status 12/30/2015 FINAL  Final  Urine culture     Status: None   Collection Time: 12/25/15 12:09 PM  Result Value Ref Range Status   Specimen Description URINE, CLEAN CATCH  Final   Special Requests NONE  Final   Culture NO GROWTH 1 DAY  Final   Report Status 12/26/2015 FINAL  Final  MRSA PCR Screening     Status: None   Collection Time: 12/25/15  9:10 PM  Result Value Ref Range Status   MRSA by PCR NEGATIVE NEGATIVE Final    Comment:        The GeneXpert MRSA Assay (FDA approved for NASAL specimens only), is one component of a comprehensive MRSA colonization surveillance program. It is not intended to diagnose MRSA infection nor to guide or monitor treatment for MRSA infections.   Culture, respiratory (NON-Expectorated)     Status: None   Collection Time: 12/25/15  9:35 PM  Result Value Ref Range Status   Specimen Description TRACHEAL ASPIRATE  Final   Special Requests NONE  Final   Gram Stain   Final    FEW WBC PRESENT,BOTH PMN AND MONONUCLEAR NO SQUAMOUS EPITHELIAL CELLS SEEN FEW GRAM NEGATIVE RODS Performed at [facility]    Culture   Final    MODERATE STREPTOCOCCUS GROUP C Note: Beta hemolytic streptococci are predictably susceptible to penicillin and other beta lactams. Susceptibility testing not routinely performed. Performed at [facility]    Report Status 12/28/2015 FINAL  Final     Labs: Basic Metabolic Panel:  Recent Labs Lab 12/28/15 0239 12/29/15 0331 12/30/15 0356 12/31/15 0429 01/01/16 0432 01/02/16 0504  NA 144 141 139 139 137 139  K 3.3* 3.0* 3.6 3.4* 3.6 4.4  CL 97* 87* 85* 86* 89* 94*  CO2 34* 40* 42* 43* 40* 37*  GLUCOSE 112* 90 101* 100* 171* 112*  BUN 15 16 21* 24* 29* 27*   CREATININE 1.19* 1.13* 1.25* 1.38* 1.47* 1.31*  CALCIUM 8.3* 8.6* 8.8* 8.6* 8.5* 8.9  MG 2.2 1.8 1.7 1.7 2.3  --   PHOS 3.0 4.2  --   --   --   --    Liver Function Tests: No results for input(s): AST, ALT, ALKPHOS, BILITOT, PROT, ALBUMIN in the last 168 hours. No results for input(s): LIPASE, AMYLASE in the last 168 hours. No results for input(s): AMMONIA in the last 168 hours. CBC:  Recent Labs Lab 12/27/15 0232 12/28/15 0239 12/29/15 0331 12/30/15 0356  WBC 7.6 8.2 8.5 9.3  HGB 10.7* 10.2* 10.8* 11.4*  HCT 36.0 34.8* 36.2 38.1  MCV 82.9 83.5 84.6 83.0  PLT 150 188 223 230   Cardiac Enzymes: No results for input(s): CKTOTAL, CKMB, CKMBINDEX, TROPONINI in the last 168 hours. BNP: BNP (last 3 results)  Recent Labs  12/25/15 1149  BNP 369.8*    ProBNP (last 3 results) No results for input(s): PROBNP in the last 8760 hours.  CBG:  Recent Labs Lab 01/01/16 1215 01/01/16 1703 01/01/16 2141 01/02/16 0739 01/02/16 1205  GLUCAP 150* 119* 144* 146* 108*       Signed:  Chayse Gracey MD.  [facility] 01/02/2016, 1:07 PM

## 2016-01-03 LAB — BLOOD GAS, ARTERIAL
ACID-BASE EXCESS: 4.5 mmol/L — AB (ref 0.0–2.0)
Bicarbonate: 29.7 mEq/L — ABNORMAL HIGH (ref 20.0–24.0)
DRAWN BY: 418751
FIO2: 0.7
LHR: 18 {breaths}/min
MECHVT: 450 mL
O2 SAT: 93.2 %
PEEP/CPAP: 5 cmH2O
PO2 ART: 66.5 mmHg — AB (ref 80.0–100.0)
Patient temperature: 98.6
TCO2: 31.4 mmol/L (ref 0–100)
pCO2 arterial: 55.4 mmHg — ABNORMAL HIGH (ref 35.0–45.0)
pH, Arterial: 7.349 — ABNORMAL LOW (ref 7.350–7.450)

## 2016-01-07 DIAGNOSIS — E039 Hypothyroidism, unspecified: Secondary | ICD-10-CM | POA: Diagnosis not present

## 2016-01-07 DIAGNOSIS — E119 Type 2 diabetes mellitus without complications: Secondary | ICD-10-CM | POA: Diagnosis not present

## 2016-01-07 DIAGNOSIS — I10 Essential (primary) hypertension: Secondary | ICD-10-CM | POA: Diagnosis not present

## 2016-01-07 DIAGNOSIS — I2510 Atherosclerotic heart disease of native coronary artery without angina pectoris: Secondary | ICD-10-CM | POA: Diagnosis not present

## 2016-01-07 DIAGNOSIS — G4733 Obstructive sleep apnea (adult) (pediatric): Secondary | ICD-10-CM | POA: Diagnosis not present

## 2016-01-07 DIAGNOSIS — Z8701 Personal history of pneumonia (recurrent): Secondary | ICD-10-CM | POA: Diagnosis not present

## 2016-01-07 DIAGNOSIS — F329 Major depressive disorder, single episode, unspecified: Secondary | ICD-10-CM | POA: Diagnosis not present

## 2016-01-07 DIAGNOSIS — K219 Gastro-esophageal reflux disease without esophagitis: Secondary | ICD-10-CM | POA: Diagnosis not present

## 2016-01-07 DIAGNOSIS — I4891 Unspecified atrial fibrillation: Secondary | ICD-10-CM | POA: Diagnosis not present

## 2016-01-07 DIAGNOSIS — I5033 Acute on chronic diastolic (congestive) heart failure: Secondary | ICD-10-CM | POA: Diagnosis not present

## 2016-01-07 DIAGNOSIS — J9620 Acute and chronic respiratory failure, unspecified whether with hypoxia or hypercapnia: Secondary | ICD-10-CM | POA: Diagnosis not present

## 2016-01-07 DIAGNOSIS — Z87891 Personal history of nicotine dependence: Secondary | ICD-10-CM | POA: Diagnosis not present

## 2016-01-09 DIAGNOSIS — E119 Type 2 diabetes mellitus without complications: Secondary | ICD-10-CM | POA: Diagnosis not present

## 2016-01-09 DIAGNOSIS — I4891 Unspecified atrial fibrillation: Secondary | ICD-10-CM | POA: Diagnosis not present

## 2016-01-09 DIAGNOSIS — G4733 Obstructive sleep apnea (adult) (pediatric): Secondary | ICD-10-CM | POA: Diagnosis not present

## 2016-01-09 DIAGNOSIS — I10 Essential (primary) hypertension: Secondary | ICD-10-CM | POA: Diagnosis not present

## 2016-01-09 DIAGNOSIS — I2510 Atherosclerotic heart disease of native coronary artery without angina pectoris: Secondary | ICD-10-CM | POA: Diagnosis not present

## 2016-01-09 DIAGNOSIS — I5033 Acute on chronic diastolic (congestive) heart failure: Secondary | ICD-10-CM | POA: Diagnosis not present

## 2016-01-11 DIAGNOSIS — G4733 Obstructive sleep apnea (adult) (pediatric): Secondary | ICD-10-CM | POA: Diagnosis not present

## 2016-01-11 DIAGNOSIS — I4891 Unspecified atrial fibrillation: Secondary | ICD-10-CM | POA: Diagnosis not present

## 2016-01-11 DIAGNOSIS — I10 Essential (primary) hypertension: Secondary | ICD-10-CM | POA: Diagnosis not present

## 2016-01-11 DIAGNOSIS — I2510 Atherosclerotic heart disease of native coronary artery without angina pectoris: Secondary | ICD-10-CM | POA: Diagnosis not present

## 2016-01-11 DIAGNOSIS — E119 Type 2 diabetes mellitus without complications: Secondary | ICD-10-CM | POA: Diagnosis not present

## 2016-01-11 DIAGNOSIS — I5033 Acute on chronic diastolic (congestive) heart failure: Secondary | ICD-10-CM | POA: Diagnosis not present

## 2016-01-13 ENCOUNTER — Ambulatory Visit (HOSPITAL_BASED_OUTPATIENT_CLINIC_OR_DEPARTMENT_OTHER): Payer: Medicare Other | Attending: Adult Health

## 2016-01-13 DIAGNOSIS — I2510 Atherosclerotic heart disease of native coronary artery without angina pectoris: Secondary | ICD-10-CM | POA: Diagnosis not present

## 2016-01-13 DIAGNOSIS — I4891 Unspecified atrial fibrillation: Secondary | ICD-10-CM | POA: Diagnosis not present

## 2016-01-13 DIAGNOSIS — I10 Essential (primary) hypertension: Secondary | ICD-10-CM | POA: Diagnosis not present

## 2016-01-13 DIAGNOSIS — E119 Type 2 diabetes mellitus without complications: Secondary | ICD-10-CM | POA: Diagnosis not present

## 2016-01-13 DIAGNOSIS — I5033 Acute on chronic diastolic (congestive) heart failure: Secondary | ICD-10-CM | POA: Diagnosis not present

## 2016-01-13 DIAGNOSIS — G4733 Obstructive sleep apnea (adult) (pediatric): Secondary | ICD-10-CM | POA: Diagnosis not present

## 2016-01-17 DIAGNOSIS — G4733 Obstructive sleep apnea (adult) (pediatric): Secondary | ICD-10-CM | POA: Diagnosis not present

## 2016-01-17 DIAGNOSIS — I4891 Unspecified atrial fibrillation: Secondary | ICD-10-CM | POA: Diagnosis not present

## 2016-01-17 DIAGNOSIS — I5033 Acute on chronic diastolic (congestive) heart failure: Secondary | ICD-10-CM | POA: Diagnosis not present

## 2016-01-17 DIAGNOSIS — I10 Essential (primary) hypertension: Secondary | ICD-10-CM | POA: Diagnosis not present

## 2016-01-17 DIAGNOSIS — I2510 Atherosclerotic heart disease of native coronary artery without angina pectoris: Secondary | ICD-10-CM | POA: Diagnosis not present

## 2016-01-17 DIAGNOSIS — E119 Type 2 diabetes mellitus without complications: Secondary | ICD-10-CM | POA: Diagnosis not present

## 2016-01-18 DIAGNOSIS — I4891 Unspecified atrial fibrillation: Secondary | ICD-10-CM | POA: Diagnosis not present

## 2016-01-18 DIAGNOSIS — I5033 Acute on chronic diastolic (congestive) heart failure: Secondary | ICD-10-CM | POA: Diagnosis not present

## 2016-01-18 DIAGNOSIS — G4733 Obstructive sleep apnea (adult) (pediatric): Secondary | ICD-10-CM | POA: Diagnosis not present

## 2016-01-18 DIAGNOSIS — E119 Type 2 diabetes mellitus without complications: Secondary | ICD-10-CM | POA: Diagnosis not present

## 2016-01-18 DIAGNOSIS — I10 Essential (primary) hypertension: Secondary | ICD-10-CM | POA: Diagnosis not present

## 2016-01-18 DIAGNOSIS — I2510 Atherosclerotic heart disease of native coronary artery without angina pectoris: Secondary | ICD-10-CM | POA: Diagnosis not present

## 2016-01-19 DIAGNOSIS — E119 Type 2 diabetes mellitus without complications: Secondary | ICD-10-CM | POA: Diagnosis not present

## 2016-01-19 DIAGNOSIS — G4733 Obstructive sleep apnea (adult) (pediatric): Secondary | ICD-10-CM | POA: Diagnosis not present

## 2016-01-19 DIAGNOSIS — I2510 Atherosclerotic heart disease of native coronary artery without angina pectoris: Secondary | ICD-10-CM | POA: Diagnosis not present

## 2016-01-19 DIAGNOSIS — I5033 Acute on chronic diastolic (congestive) heart failure: Secondary | ICD-10-CM | POA: Diagnosis not present

## 2016-01-19 DIAGNOSIS — I10 Essential (primary) hypertension: Secondary | ICD-10-CM | POA: Diagnosis not present

## 2016-01-19 DIAGNOSIS — I4891 Unspecified atrial fibrillation: Secondary | ICD-10-CM | POA: Diagnosis not present

## 2016-01-20 DIAGNOSIS — E1142 Type 2 diabetes mellitus with diabetic polyneuropathy: Secondary | ICD-10-CM | POA: Diagnosis not present

## 2016-01-20 DIAGNOSIS — I482 Chronic atrial fibrillation: Secondary | ICD-10-CM | POA: Diagnosis not present

## 2016-01-20 DIAGNOSIS — G4733 Obstructive sleep apnea (adult) (pediatric): Secondary | ICD-10-CM | POA: Diagnosis not present

## 2016-01-20 DIAGNOSIS — I5033 Acute on chronic diastolic (congestive) heart failure: Secondary | ICD-10-CM | POA: Diagnosis not present

## 2016-01-20 DIAGNOSIS — I10 Essential (primary) hypertension: Secondary | ICD-10-CM | POA: Diagnosis not present

## 2016-01-20 DIAGNOSIS — I4891 Unspecified atrial fibrillation: Secondary | ICD-10-CM | POA: Diagnosis not present

## 2016-01-20 DIAGNOSIS — L03116 Cellulitis of left lower limb: Secondary | ICD-10-CM | POA: Diagnosis not present

## 2016-01-20 DIAGNOSIS — E119 Type 2 diabetes mellitus without complications: Secondary | ICD-10-CM | POA: Diagnosis not present

## 2016-01-20 DIAGNOSIS — I2510 Atherosclerotic heart disease of native coronary artery without angina pectoris: Secondary | ICD-10-CM | POA: Diagnosis not present

## 2016-01-21 DIAGNOSIS — I10 Essential (primary) hypertension: Secondary | ICD-10-CM | POA: Diagnosis not present

## 2016-01-21 DIAGNOSIS — G4733 Obstructive sleep apnea (adult) (pediatric): Secondary | ICD-10-CM | POA: Diagnosis not present

## 2016-01-21 DIAGNOSIS — E119 Type 2 diabetes mellitus without complications: Secondary | ICD-10-CM | POA: Diagnosis not present

## 2016-01-21 DIAGNOSIS — I5033 Acute on chronic diastolic (congestive) heart failure: Secondary | ICD-10-CM | POA: Diagnosis not present

## 2016-01-21 DIAGNOSIS — I4891 Unspecified atrial fibrillation: Secondary | ICD-10-CM | POA: Diagnosis not present

## 2016-01-21 DIAGNOSIS — I2510 Atherosclerotic heart disease of native coronary artery without angina pectoris: Secondary | ICD-10-CM | POA: Diagnosis not present

## 2016-01-24 DIAGNOSIS — G4733 Obstructive sleep apnea (adult) (pediatric): Secondary | ICD-10-CM | POA: Diagnosis not present

## 2016-01-24 DIAGNOSIS — E119 Type 2 diabetes mellitus without complications: Secondary | ICD-10-CM | POA: Diagnosis not present

## 2016-01-24 DIAGNOSIS — I10 Essential (primary) hypertension: Secondary | ICD-10-CM | POA: Diagnosis not present

## 2016-01-24 DIAGNOSIS — I5033 Acute on chronic diastolic (congestive) heart failure: Secondary | ICD-10-CM | POA: Diagnosis not present

## 2016-01-24 DIAGNOSIS — I2510 Atherosclerotic heart disease of native coronary artery without angina pectoris: Secondary | ICD-10-CM | POA: Diagnosis not present

## 2016-01-24 DIAGNOSIS — I4891 Unspecified atrial fibrillation: Secondary | ICD-10-CM | POA: Diagnosis not present

## 2016-01-25 DIAGNOSIS — E119 Type 2 diabetes mellitus without complications: Secondary | ICD-10-CM | POA: Diagnosis not present

## 2016-01-25 DIAGNOSIS — G4733 Obstructive sleep apnea (adult) (pediatric): Secondary | ICD-10-CM | POA: Diagnosis not present

## 2016-01-25 DIAGNOSIS — I4891 Unspecified atrial fibrillation: Secondary | ICD-10-CM | POA: Diagnosis not present

## 2016-01-25 DIAGNOSIS — I2510 Atherosclerotic heart disease of native coronary artery without angina pectoris: Secondary | ICD-10-CM | POA: Diagnosis not present

## 2016-01-25 DIAGNOSIS — I10 Essential (primary) hypertension: Secondary | ICD-10-CM | POA: Diagnosis not present

## 2016-01-25 DIAGNOSIS — I5033 Acute on chronic diastolic (congestive) heart failure: Secondary | ICD-10-CM | POA: Diagnosis not present

## 2016-01-28 DIAGNOSIS — F331 Major depressive disorder, recurrent, moderate: Secondary | ICD-10-CM | POA: Diagnosis not present

## 2016-01-28 DIAGNOSIS — F329 Major depressive disorder, single episode, unspecified: Secondary | ICD-10-CM | POA: Diagnosis not present

## 2016-02-01 DIAGNOSIS — I10 Essential (primary) hypertension: Secondary | ICD-10-CM | POA: Diagnosis not present

## 2016-02-01 DIAGNOSIS — I4891 Unspecified atrial fibrillation: Secondary | ICD-10-CM | POA: Diagnosis not present

## 2016-02-01 DIAGNOSIS — I5033 Acute on chronic diastolic (congestive) heart failure: Secondary | ICD-10-CM | POA: Diagnosis not present

## 2016-02-01 DIAGNOSIS — G4733 Obstructive sleep apnea (adult) (pediatric): Secondary | ICD-10-CM | POA: Diagnosis not present

## 2016-02-01 DIAGNOSIS — E119 Type 2 diabetes mellitus without complications: Secondary | ICD-10-CM | POA: Diagnosis not present

## 2016-02-01 DIAGNOSIS — I2510 Atherosclerotic heart disease of native coronary artery without angina pectoris: Secondary | ICD-10-CM | POA: Diagnosis not present

## 2016-02-02 DIAGNOSIS — E119 Type 2 diabetes mellitus without complications: Secondary | ICD-10-CM | POA: Diagnosis not present

## 2016-02-02 DIAGNOSIS — I2510 Atherosclerotic heart disease of native coronary artery without angina pectoris: Secondary | ICD-10-CM | POA: Diagnosis not present

## 2016-02-02 DIAGNOSIS — I4891 Unspecified atrial fibrillation: Secondary | ICD-10-CM | POA: Diagnosis not present

## 2016-02-02 DIAGNOSIS — G4733 Obstructive sleep apnea (adult) (pediatric): Secondary | ICD-10-CM | POA: Diagnosis not present

## 2016-02-02 DIAGNOSIS — M170 Bilateral primary osteoarthritis of knee: Secondary | ICD-10-CM | POA: Diagnosis not present

## 2016-02-02 DIAGNOSIS — I5033 Acute on chronic diastolic (congestive) heart failure: Secondary | ICD-10-CM | POA: Diagnosis not present

## 2016-02-02 DIAGNOSIS — I10 Essential (primary) hypertension: Secondary | ICD-10-CM | POA: Diagnosis not present

## 2016-02-04 DIAGNOSIS — I4891 Unspecified atrial fibrillation: Secondary | ICD-10-CM | POA: Diagnosis not present

## 2016-02-04 DIAGNOSIS — I2510 Atherosclerotic heart disease of native coronary artery without angina pectoris: Secondary | ICD-10-CM | POA: Diagnosis not present

## 2016-02-04 DIAGNOSIS — G4733 Obstructive sleep apnea (adult) (pediatric): Secondary | ICD-10-CM | POA: Diagnosis not present

## 2016-02-04 DIAGNOSIS — I10 Essential (primary) hypertension: Secondary | ICD-10-CM | POA: Diagnosis not present

## 2016-02-04 DIAGNOSIS — I5033 Acute on chronic diastolic (congestive) heart failure: Secondary | ICD-10-CM | POA: Diagnosis not present

## 2016-02-04 DIAGNOSIS — E119 Type 2 diabetes mellitus without complications: Secondary | ICD-10-CM | POA: Diagnosis not present

## 2016-02-08 DIAGNOSIS — I4891 Unspecified atrial fibrillation: Secondary | ICD-10-CM | POA: Diagnosis not present

## 2016-02-08 DIAGNOSIS — E119 Type 2 diabetes mellitus without complications: Secondary | ICD-10-CM | POA: Diagnosis not present

## 2016-02-08 DIAGNOSIS — I2510 Atherosclerotic heart disease of native coronary artery without angina pectoris: Secondary | ICD-10-CM | POA: Diagnosis not present

## 2016-02-08 DIAGNOSIS — I10 Essential (primary) hypertension: Secondary | ICD-10-CM | POA: Diagnosis not present

## 2016-02-08 DIAGNOSIS — G4733 Obstructive sleep apnea (adult) (pediatric): Secondary | ICD-10-CM | POA: Diagnosis not present

## 2016-02-08 DIAGNOSIS — I5033 Acute on chronic diastolic (congestive) heart failure: Secondary | ICD-10-CM | POA: Diagnosis not present

## 2016-02-09 DIAGNOSIS — E119 Type 2 diabetes mellitus without complications: Secondary | ICD-10-CM | POA: Diagnosis not present

## 2016-02-09 DIAGNOSIS — G4733 Obstructive sleep apnea (adult) (pediatric): Secondary | ICD-10-CM | POA: Diagnosis not present

## 2016-02-09 DIAGNOSIS — I4891 Unspecified atrial fibrillation: Secondary | ICD-10-CM | POA: Diagnosis not present

## 2016-02-09 DIAGNOSIS — I5033 Acute on chronic diastolic (congestive) heart failure: Secondary | ICD-10-CM | POA: Diagnosis not present

## 2016-02-09 DIAGNOSIS — I10 Essential (primary) hypertension: Secondary | ICD-10-CM | POA: Diagnosis not present

## 2016-02-09 DIAGNOSIS — I2510 Atherosclerotic heart disease of native coronary artery without angina pectoris: Secondary | ICD-10-CM | POA: Diagnosis not present

## 2016-02-10 ENCOUNTER — Encounter (HOSPITAL_COMMUNITY): Payer: Self-pay

## 2016-02-10 ENCOUNTER — Inpatient Hospital Stay (HOSPITAL_COMMUNITY)
Admission: EM | Admit: 2016-02-10 | Discharge: 2016-02-13 | DRG: 291 | Disposition: A | Discharge status: Home Health | Payer: Medicare Other | Attending: Cardiovascular Disease | Admitting: Cardiovascular Disease

## 2016-02-10 ENCOUNTER — Emergency Department (HOSPITAL_COMMUNITY): Payer: Medicare Other

## 2016-02-10 DIAGNOSIS — G8929 Other chronic pain: Secondary | ICD-10-CM | POA: Diagnosis present

## 2016-02-10 DIAGNOSIS — Z881 Allergy status to other antibiotic agents status: Secondary | ICD-10-CM | POA: Diagnosis not present

## 2016-02-10 DIAGNOSIS — Z87891 Personal history of nicotine dependence: Secondary | ICD-10-CM

## 2016-02-10 DIAGNOSIS — I110 Hypertensive heart disease with heart failure: Principal | ICD-10-CM | POA: Diagnosis present

## 2016-02-10 DIAGNOSIS — J9621 Acute and chronic respiratory failure with hypoxia: Secondary | ICD-10-CM | POA: Diagnosis present

## 2016-02-10 DIAGNOSIS — J9622 Acute and chronic respiratory failure with hypercapnia: Secondary | ICD-10-CM | POA: Diagnosis present

## 2016-02-10 DIAGNOSIS — F988 Other specified behavioral and emotional disorders with onset usually occurring in childhood and adolescence: Secondary | ICD-10-CM | POA: Diagnosis present

## 2016-02-10 DIAGNOSIS — J449 Chronic obstructive pulmonary disease, unspecified: Secondary | ICD-10-CM | POA: Diagnosis not present

## 2016-02-10 DIAGNOSIS — Z886 Allergy status to analgesic agent status: Secondary | ICD-10-CM

## 2016-02-10 DIAGNOSIS — I2510 Atherosclerotic heart disease of native coronary artery without angina pectoris: Secondary | ICD-10-CM | POA: Diagnosis not present

## 2016-02-10 DIAGNOSIS — J9601 Acute respiratory failure with hypoxia: Secondary | ICD-10-CM

## 2016-02-10 DIAGNOSIS — Z87442 Personal history of urinary calculi: Secondary | ICD-10-CM | POA: Diagnosis not present

## 2016-02-10 DIAGNOSIS — E039 Hypothyroidism, unspecified: Secondary | ICD-10-CM | POA: Diagnosis not present

## 2016-02-10 DIAGNOSIS — K219 Gastro-esophageal reflux disease without esophagitis: Secondary | ICD-10-CM | POA: Diagnosis present

## 2016-02-10 DIAGNOSIS — G4733 Obstructive sleep apnea (adult) (pediatric): Secondary | ICD-10-CM | POA: Diagnosis present

## 2016-02-10 DIAGNOSIS — G2581 Restless legs syndrome: Secondary | ICD-10-CM | POA: Diagnosis present

## 2016-02-10 DIAGNOSIS — M545 Low back pain: Secondary | ICD-10-CM | POA: Diagnosis present

## 2016-02-10 DIAGNOSIS — F329 Major depressive disorder, single episode, unspecified: Secondary | ICD-10-CM | POA: Diagnosis present

## 2016-02-10 DIAGNOSIS — I5033 Acute on chronic diastolic (congestive) heart failure: Secondary | ICD-10-CM | POA: Diagnosis present

## 2016-02-10 DIAGNOSIS — I4892 Unspecified atrial flutter: Secondary | ICD-10-CM | POA: Diagnosis present

## 2016-02-10 DIAGNOSIS — I480 Paroxysmal atrial fibrillation: Secondary | ICD-10-CM | POA: Diagnosis present

## 2016-02-10 DIAGNOSIS — Z8542 Personal history of malignant neoplasm of other parts of uterus: Secondary | ICD-10-CM

## 2016-02-10 DIAGNOSIS — E669 Obesity, unspecified: Secondary | ICD-10-CM | POA: Diagnosis not present

## 2016-02-10 DIAGNOSIS — Z809 Family history of malignant neoplasm, unspecified: Secondary | ICD-10-CM

## 2016-02-10 DIAGNOSIS — Z79899 Other long term (current) drug therapy: Secondary | ICD-10-CM

## 2016-02-10 DIAGNOSIS — I482 Chronic atrial fibrillation: Secondary | ICD-10-CM | POA: Diagnosis present

## 2016-02-10 DIAGNOSIS — Z6841 Body Mass Index (BMI) 40.0 and over, adult: Secondary | ICD-10-CM | POA: Diagnosis not present

## 2016-02-10 DIAGNOSIS — I481 Persistent atrial fibrillation: Secondary | ICD-10-CM | POA: Diagnosis not present

## 2016-02-10 DIAGNOSIS — Z882 Allergy status to sulfonamides status: Secondary | ICD-10-CM | POA: Diagnosis not present

## 2016-02-10 DIAGNOSIS — I6789 Other cerebrovascular disease: Secondary | ICD-10-CM | POA: Diagnosis not present

## 2016-02-10 DIAGNOSIS — R251 Tremor, unspecified: Secondary | ICD-10-CM | POA: Diagnosis not present

## 2016-02-10 DIAGNOSIS — E872 Acidosis: Secondary | ICD-10-CM | POA: Diagnosis present

## 2016-02-10 DIAGNOSIS — Z888 Allergy status to other drugs, medicaments and biological substances status: Secondary | ICD-10-CM | POA: Diagnosis not present

## 2016-02-10 DIAGNOSIS — R0602 Shortness of breath: Secondary | ICD-10-CM | POA: Diagnosis not present

## 2016-02-10 DIAGNOSIS — E119 Type 2 diabetes mellitus without complications: Secondary | ICD-10-CM | POA: Diagnosis not present

## 2016-02-10 DIAGNOSIS — J9602 Acute respiratory failure with hypercapnia: Secondary | ICD-10-CM | POA: Diagnosis not present

## 2016-02-10 DIAGNOSIS — I5043 Acute on chronic combined systolic (congestive) and diastolic (congestive) heart failure: Secondary | ICD-10-CM | POA: Diagnosis present

## 2016-02-10 LAB — I-STAT ARTERIAL BLOOD GAS, ED
Acid-Base Excess: 4 mmol/L — ABNORMAL HIGH (ref 0.0–2.0)
Bicarbonate: 33.2 mEq/L — ABNORMAL HIGH (ref 20.0–24.0)
O2 Saturation: 91 %
PCO2 ART: 68.3 mmHg — AB (ref 35.0–45.0)
PH ART: 7.295 — AB (ref 7.350–7.450)
PO2 ART: 71 mmHg — AB (ref 80.0–100.0)
Patient temperature: 98.5
TCO2: 35 mmol/L (ref 0–100)

## 2016-02-10 LAB — CBC WITH DIFFERENTIAL/PLATELET
BASOS ABS: 0 10*3/uL (ref 0.0–0.1)
Basophils Relative: 0 %
Eosinophils Absolute: 0.1 10*3/uL (ref 0.0–0.7)
Eosinophils Relative: 1 %
HEMATOCRIT: 42.1 % (ref 36.0–46.0)
Hemoglobin: 11.9 g/dL — ABNORMAL LOW (ref 12.0–15.0)
LYMPHS ABS: 1.2 10*3/uL (ref 0.7–4.0)
LYMPHS PCT: 10 %
MCH: 24 pg — ABNORMAL LOW (ref 26.0–34.0)
MCHC: 28.3 g/dL — AB (ref 30.0–36.0)
MCV: 85.1 fL (ref 78.0–100.0)
MONO ABS: 0.8 10*3/uL (ref 0.1–1.0)
MONOS PCT: 7 %
NEUTROS ABS: 9.1 10*3/uL — AB (ref 1.7–7.7)
Neutrophils Relative %: 82 %
Platelets: 256 10*3/uL (ref 150–400)
RBC: 4.95 MIL/uL (ref 3.87–5.11)
RDW: 15.5 % (ref 11.5–15.5)
WBC: 11.1 10*3/uL — ABNORMAL HIGH (ref 4.0–10.5)

## 2016-02-10 LAB — COMPREHENSIVE METABOLIC PANEL
ALT: 12 U/L — ABNORMAL LOW (ref 14–54)
AST: 16 U/L (ref 15–41)
Albumin: 3 g/dL — ABNORMAL LOW (ref 3.5–5.0)
Alkaline Phosphatase: 97 U/L (ref 38–126)
Anion gap: 12 (ref 5–15)
BILIRUBIN TOTAL: 0.4 mg/dL (ref 0.3–1.2)
BUN: 17 mg/dL (ref 6–20)
CO2: 30 mmol/L (ref 22–32)
Calcium: 8.8 mg/dL — ABNORMAL LOW (ref 8.9–10.3)
Chloride: 100 mmol/L — ABNORMAL LOW (ref 101–111)
Creatinine, Ser: 1.23 mg/dL — ABNORMAL HIGH (ref 0.44–1.00)
GFR calc Af Amer: 54 mL/min — ABNORMAL LOW (ref >60)
GFR, EST NON AFRICAN AMERICAN: 46 mL/min — AB (ref >60)
Glucose, Bld: 145 mg/dL — ABNORMAL HIGH (ref 65–99)
POTASSIUM: 5.7 mmol/L — AB (ref 3.5–5.1)
Sodium: 142 mmol/L (ref 135–145)
TOTAL PROTEIN: 7 g/dL (ref 6.5–8.1)

## 2016-02-10 LAB — I-STAT TROPONIN, ED: Troponin i, poc: 0 ng/mL (ref 0.00–0.08)

## 2016-02-10 LAB — BRAIN NATRIURETIC PEPTIDE: B Natriuretic Peptide: 333.4 pg/mL — ABNORMAL HIGH (ref 0.0–100.0)

## 2016-02-10 MED ORDER — METOPROLOL TARTRATE 25 MG PO TABS
75.0000 mg | ORAL_TABLET | Freq: Two times a day (BID) | ORAL | Status: DC
  Administered 2016-02-11 – 2016-02-13 (×5): 75 mg via ORAL
  Filled 2016-02-10 (×5): qty 3

## 2016-02-10 MED ORDER — IPRATROPIUM-ALBUTEROL 0.5-2.5 (3) MG/3ML IN SOLN
3.0000 mL | RESPIRATORY_TRACT | Status: AC
  Administered 2016-02-10 (×3): 3 mL via RESPIRATORY_TRACT
  Filled 2016-02-10: qty 3
  Filled 2016-02-10: qty 6

## 2016-02-10 MED ORDER — METHYLPREDNISOLONE SODIUM SUCC 125 MG IJ SOLR
125.0000 mg | Freq: Once | INTRAMUSCULAR | Status: AC
Start: 2016-02-10 — End: 2016-02-10
  Administered 2016-02-10: 125 mg via INTRAVENOUS
  Filled 2016-02-10: qty 2

## 2016-02-10 MED ORDER — PANTOPRAZOLE SODIUM 40 MG PO TBEC
40.0000 mg | DELAYED_RELEASE_TABLET | Freq: Every day | ORAL | Status: DC
  Administered 2016-02-11 – 2016-02-13 (×3): 40 mg via ORAL
  Filled 2016-02-10 (×3): qty 1

## 2016-02-10 MED ORDER — SODIUM CHLORIDE 0.9% FLUSH: 3.0000 mL | INTRAVENOUS | Status: DC | PRN

## 2016-02-10 MED ORDER — PROCHLORPERAZINE MALEATE 10 MG PO TABS: 10.0000 mg | ORAL_TABLET | Freq: Four times a day (QID) | ORAL | Status: DC | PRN

## 2016-02-10 MED ORDER — RIVAROXABAN 20 MG PO TABS
20.0000 mg | ORAL_TABLET | Freq: Every day | ORAL | Status: DC
  Administered 2016-02-10 – 2016-02-12 (×3): 20 mg via ORAL
  Filled 2016-02-10 (×4): qty 1

## 2016-02-10 MED ORDER — SODIUM CHLORIDE 0.9 % IV SOLN: 250.0000 mL | INTRAVENOUS | Status: DC | PRN

## 2016-02-10 MED ORDER — SODIUM CHLORIDE 0.9% FLUSH
3.0000 mL | Freq: Two times a day (BID) | INTRAVENOUS | Status: DC
  Administered 2016-02-10 – 2016-02-13 (×6): 3 mL via INTRAVENOUS

## 2016-02-10 MED ORDER — ACETAMINOPHEN 325 MG PO TABS: 650.0000 mg | ORAL_TABLET | ORAL | Status: DC | PRN

## 2016-02-10 MED ORDER — FUROSEMIDE 10 MG/ML IJ SOLN
60.0000 mg | Freq: Once | INTRAMUSCULAR | Status: AC
  Administered 2016-02-10: 60 mg via INTRAVENOUS
  Filled 2016-02-10: qty 6

## 2016-02-10 MED ORDER — DILTIAZEM HCL ER COATED BEADS 240 MG PO CP24
240.0000 mg | ORAL_CAPSULE | Freq: Every day | ORAL | Status: DC
  Administered 2016-02-11 – 2016-02-13 (×3): 240 mg via ORAL
  Filled 2016-02-10 (×3): qty 1

## 2016-02-10 MED ORDER — ACETAZOLAMIDE 250 MG PO TABS
250.0000 mg | ORAL_TABLET | Freq: Two times a day (BID) | ORAL | Status: DC
  Administered 2016-02-11 – 2016-02-13 (×5): 250 mg via ORAL
  Filled 2016-02-10 (×9): qty 1

## 2016-02-10 MED ORDER — FUROSEMIDE 10 MG/ML IJ SOLN
40.0000 mg | Freq: Two times a day (BID) | INTRAMUSCULAR | Status: DC
  Administered 2016-02-11 – 2016-02-13 (×5): 40 mg via INTRAVENOUS
  Filled 2016-02-10 (×5): qty 4

## 2016-02-10 MED ORDER — LEVOTHYROXINE SODIUM 100 MCG PO TABS
100.0000 ug | ORAL_TABLET | Freq: Every day | ORAL | Status: DC
  Administered 2016-02-11 – 2016-02-13 (×3): 100 ug via ORAL
  Filled 2016-02-10 (×4): qty 1

## 2016-02-10 MED ORDER — LORATADINE 10 MG PO TABS
10.0000 mg | ORAL_TABLET | Freq: Every day | ORAL | Status: DC
  Administered 2016-02-11 – 2016-02-13 (×3): 10 mg via ORAL
  Filled 2016-02-10 (×3): qty 1

## 2016-02-10 NOTE — ED Provider Notes (Signed)
CSN: [id number]     Arrival date & time 02/10/16  1831 History   First MD Initiated Contact with Patient 02/10/16 1836     Chief Complaint  Patient presents with  . Shortness of Breath     (Consider location/radiation/quality/duration/timing/severity/associated sxs/prior Treatment) Patient is a 60 y.o. female presenting with altered mental status. The history is provided by the EMS personnel.  Altered Mental Status Presenting symptoms: disorientation   Severity:  Moderate Most recent episode:  Today Duration: All day. Timing:  Constant Progression:  Unchanged Context: not taking medications as prescribed (see below)   Context comment:  Found to be hypoxic, satting in the 80s on her home oxygen at 3 L nasal cannula. EMS reported that the patient's family had been checking up on her all day while she was sleeping and was hard to arouse.  Associated symptoms: no abdominal pain, no fever, no headaches, no light-headedness, no nausea, no palpitations, no rash, no vomiting and no weakness     Altered mental status found to be satting 85% on her home 3 L nasal cannula home oxygen upon EMS arrival. Mental status and saturations improved after patient was placed on 4 L nasal cannula. Patient reports that she was out of her medicines for approximately 2 days but did restarted her medicine within the last 2 days.  Past Medical History  Diagnosis Date  . OSA (obstructive sleep apnea)   . CHF (congestive heart failure) ([place])   . Diabetes mellitus   . Altered mental status     2nd to hypercapnea  . Acute and chronic respiratory failure with hypercapnia ([place])   . Hypoxemia   . Endometrial cancer ([place])   . Morbid obesity ([place])   . Depression   . Hypothyroidism   . GERD (gastroesophageal reflux disease)   . Nephrolithiasis   . Chronic low back pain   . ADD (attention deficit disorder)   . Restless leg syndrome   . Coronary artery disease   . Complication of anesthesia     " I AM SLOW MTO  WAKE UP AT ITMES "  . Hypertension   . Hypercapnia   . Paroxysmal atrial fibrillation [facility])    Past Surgical History  Procedure Laterality Date  . Back surgery      x2  . Shoulder surgery      x2  . Vaginal hysterectomy      x2  . Dental surgery    . Tonsillectomy    . Sp perc nephrostomy     Family History  Problem Relation Age of Onset  . Cancer Father    Social History  Substance Use Topics  . Smoking status: Former Smoker -- 0.50 packs/day for 2 years    Types: Cigarettes    Quit date: 09/10/1974  . Smokeless tobacco: Never Used     Comment: quit in teenage years  . Alcohol Use: No   OB History    No data available     Review of Systems  Constitutional: Negative for fever, chills, appetite change and fatigue.  HENT: Negative for congestion, ear pain, facial swelling, mouth sores and sore throat.   Eyes: Negative for visual disturbance.  Respiratory: Positive for shortness of breath. Negative for cough and chest tightness.   Cardiovascular: Positive for leg swelling. Negative for chest pain and palpitations.  Gastrointestinal: Negative for nausea, vomiting, abdominal pain, diarrhea and blood in stool.  Endocrine: Negative for cold intolerance and heat intolerance.  Genitourinary: Negative for frequency, decreased  urine volume and difficulty urinating.  Musculoskeletal: Negative for back pain and neck stiffness.  Skin: Negative for rash.  Neurological: Negative for dizziness, weakness, light-headedness and headaches.  All other systems reviewed and are negative.     Allergies  Aspirin; Eliquis; Nsaids; Other; Plavix; Sudafed; Erythromycin; Sulfa antibiotics; Sulfamethoxazole-trimethoprim; Altace; and Zofran  Home Medications   Prior to Admission medications   Medication Sig Start Date End Date Taking? Authorizing Provider  acetaZOLAMIDE (DIAMOX) 250 MG tablet Take 1 tablet (250 mg total) by mouth 2 (two) times daily. 01/02/16  Yes Hosie Poisson, MD  b  complex vitamins tablet Take 1 tablet by mouth daily.    Yes Historical Provider, MD  BIOTIN PO Take 1 tablet by mouth 2 (two) times daily.   Yes Historical Provider, MD  cetirizine (ZYRTEC) 10 MG tablet Take 10 mg by mouth at bedtime.   Yes Historical Provider, MD  clotrimazole-betamethasone (LOTRISONE) cream Apply 1 application topically 2 (two) times daily.   Yes Historical Provider, MD  diltiazem (DILACOR XR) 240 MG 24 hr capsule Take 1 capsule (240 mg total) by mouth daily. 11/11/14  Yes Dixie Dials, MD  furosemide (LASIX) 80 MG tablet Take 1 tablet (80 mg total) by mouth daily. 01/02/16  Yes Hosie Poisson, MD  levothyroxine (SYNTHROID, LEVOTHROID) 100 MCG tablet Take 1 tablet (100 mcg total) by mouth daily before breakfast. 01/02/16  Yes Hosie Poisson, MD  methadone (DOLOPHINE) 10 MG/5ML solution Take 70 mg by mouth every morning.    Yes Historical Provider, MD  Metoprolol Tartrate 75 MG TABS Take 75 mg by mouth 2 (two) times daily. 01/02/16  Yes Hosie Poisson, MD  nystatin (MYCOSTATIN/NYSTOP) 100000 UNIT/GM POWD Apply 1 g topically 2 (two) times daily as needed (for rash).   Yes Historical Provider, MD  pantoprazole (PROTONIX) 40 MG tablet Take 40 mg by mouth daily. 10/06/15  Yes Historical Provider, MD  potassium chloride SA (K-DUR,KLOR-CON) 20 MEQ tablet Take 20 mEq by mouth 2 (two) times daily.   Yes Historical Provider, MD  prochlorperazine (COMPAZINE) 10 MG tablet Take 10 mg by mouth every 6 (six) hours as needed for nausea or vomiting.   Yes Historical Provider, MD  rivaroxaban (XARELTO) 20 MG TABS tablet Take 1 tablet (20 mg total) by mouth daily with supper. 01/02/16  Yes Hosie Poisson, MD  sertraline (ZOLOFT) 100 MG tablet Take 200 mg by mouth at bedtime.    Yes Historical Provider, MD   BP 93/73 mmHg  Pulse 111  Temp(Src) 99.5 F (37.5 C) (Oral)  Resp 20  Ht 5\' 5"  (1.651 m)  Wt 131.543 kg  BMI 48.26 kg/m2  SpO2 100% Physical Exam  Constitutional: She is oriented to person, place,  and time. She appears well-developed and well-nourished. No distress.  HENT:  Head: Normocephalic and atraumatic.  Right Ear: External ear normal.  Left Ear: External ear normal.  Nose: Nose normal.  Eyes: Conjunctivae and EOM are normal. Pupils are equal, round, and reactive to light. Right eye exhibits no discharge. Left eye exhibits no discharge. No scleral icterus.  Neck: Normal range of motion. Neck supple.  Cardiovascular: Normal rate, regular rhythm and normal heart sounds.  Exam reveals no gallop and no friction rub.   No murmur heard. Pulmonary/Chest: Effort normal. No stridor. Tachypnea noted. No respiratory distress. She has decreased breath sounds. She has wheezes.  Abdominal: Soft. She exhibits no distension. There is no tenderness.  Musculoskeletal: She exhibits no edema or tenderness.  1+ bilateral lower extremity pitting  edema  Neurological: She is alert and oriented to person, place, and time.  Skin: Skin is warm and dry. No rash noted. She is not diaphoretic. No erythema.  Psychiatric: She has a normal mood and affect.    ED Course  Procedures (including critical care time) Labs Review Labs Reviewed  CBC WITH DIFFERENTIAL/PLATELET - Abnormal; Notable for the following:    WBC 11.1 (*)    Hemoglobin 11.9 (*)    MCH 24.0 (*)    MCHC 28.3 (*)    Neutro Abs 9.1 (*)    All other components within normal limits  COMPREHENSIVE METABOLIC PANEL - Abnormal; Notable for the following:    Potassium 5.7 (*)    Chloride 100 (*)    Glucose, Bld 145 (*)    Creatinine, Ser 1.23 (*)    Calcium 8.8 (*)    Albumin 3.0 (*)    ALT 12 (*)    GFR calc non Af Amer 46 (*)    GFR calc Af Amer 54 (*)    All other components within normal limits  BRAIN NATRIURETIC PEPTIDE - Abnormal; Notable for the following:    B Natriuretic Peptide 333.4 (*)    All other components within normal limits  I-STAT ARTERIAL BLOOD GAS, ED - Abnormal; Notable for the following:    pH, Arterial 7.295 (*)     pCO2 arterial 68.3 (*)    pO2, Arterial 71.0 (*)    Bicarbonate 33.2 (*)    Acid-Base Excess 4.0 (*)    All other components within normal limits  BASIC METABOLIC PANEL  I-STAT TROPOININ, ED    Imaging Review Dg Chest Portable 1 View  02/10/2016  CLINICAL DATA:  60 year old female with shortness of breath and increasing lip itchy history of cancer and CHF. EXAM: PORTABLE CHEST 1 VIEW COMPARISON:  Radiograph dated 12/30/2015 FINDINGS: The lungs are hypovolemic. There is mild diffuse increased vascular and interstitial prominence compatible with mild congestive changes. There is no focal consolidation, pleural effusion, or pneumothorax. Stable cardiac silhouette. No acute osseous pathology. IMPRESSION: Findings likely represent mild congestive changes. No focal consolidation. Electronically Signed   By: Anner Crete M.D.   On: 02/10/2016 18:57   I have personally reviewed and evaluated these images and lab results as part of my medical decision-making.   EKG Interpretation   Date/Time:  Thursday February 10 2016 19:10:36 EST Ventricular Rate:  99 PR Interval:    QRS Duration: 164 QT Interval:  346 QTC Calculation: 444 R Axis:   -28 Text Interpretation:  Atrial flutter Paired ventricular premature  complexes Aberrant conduction of SV complex(es) Nonspecific  intraventricular conduction delay Anteroseptal infarct, age indeterminate  Lateral leads are also involved No significant change since last tracing  Confirmed by [facility] MD, Corene Cornea [phone]) on 02/10/2016 8:47:52 PM      MDM    COPD exacerbation versus or superimposed CHF exacerbation. ABG with respiratory acidosis. Given one continuous DuoNeb and steroids.  Chest x-ray with mild pulmonary congestion without evidence of pneumonia. BNP >300. Given lasix 60mg  IV. On reassessment patient's air movement improved. She is satting 100% on 3 L nasal cannula.  EKG with A. fib rate controlled in the 90s. No evidence of peaked T waves.  Potassium 5.7. Renal function improved from baseline.   Patient will be admitted by Dr. Doylene Canard.  Diagnositc studies interpreted by me and use to my clinical decision-making.   Patient seen in conjunction with Dr. Dayna Barker  Final diagnoses:  Acute respiratory failure with  hypoxia and hypercapnia [facility])       Addison Lank, MD 02/10/16 [id number]  Merrily Pew, MD 02/10/16 2238

## 2016-02-10 NOTE — H&P (Signed)
Referring Physician:  EMIYAH SPRAGGINS is an 60 y.o. female.                       Chief Complaint: Altered mental status  HPI: 60 y.o. female with past medical history of obstructive sleep apnea, diastolic heart failure, DM, II, restless leg syndrome, acute on chronic respiratory failure with hypercapnia, Endometrial cancer, morbid obesity, hypothyroidism, Chronic lower back pain, attention deficit disorder, coronary artery disease, is presenting with altered mental status. Her oxygen saturation was 85 % on 3 L nasal cannula and 98 % on 4 L nasal cannula. Patient ran out of medications x 2 days.   Past Medical History  Diagnosis Date  . OSA (obstructive sleep apnea)   . CHF (congestive heart failure) ([place])   . Diabetes mellitus   . Altered mental status     2nd to hypercapnea  . Acute and chronic respiratory failure with hypercapnia ([place])   . Hypoxemia   . Endometrial cancer ([place])   . Morbid obesity ([place])   . Depression   . Hypothyroidism   . GERD (gastroesophageal reflux disease)   . Nephrolithiasis   . Chronic low back pain   . ADD (attention deficit disorder)   . Restless leg syndrome   . Coronary artery disease   . Complication of anesthesia     " I AM SLOW MTO WAKE UP AT ITMES "  . Hypertension   . Hypercapnia   . Paroxysmal atrial fibrillation [facility])       Past Surgical History  Procedure Laterality Date  . Back surgery      x2  . Shoulder surgery      x2  . Vaginal hysterectomy      x2  . Dental surgery    . Tonsillectomy    . Sp perc nephrostomy      Family History  Problem Relation Age of Onset  . Cancer Father    Social History:  reports that she quit smoking about 41 years ago. Her smoking use included Cigarettes. She has a 1 pack-year smoking history. She has never used smokeless tobacco. She reports that she does not drink alcohol or use illicit drugs.  Allergies:  Allergies  Allergen Reactions  . Aspirin Other (See Comments)    Chest pain  .  Eliquis [Apixaban]     Excessive bleeding  . Nsaids Other (See Comments)    Chest pain  . Other Other (See Comments)    "BP med" combined with plavix, caused syncope  . Plavix [Clopidogrel] Other (See Comments)    syncope  . Sudafed [Pseudoephedrine] Palpitations    Raised BP  . Erythromycin Nausea And Vomiting  . Sulfa Antibiotics Other (See Comments)    constipation  . Sulfamethoxazole-Trimethoprim Diarrhea  . Altace [Ramipril] Other (See Comments)    Severe fatigue  . Zofran [Ondansetron Hcl] Other (See Comments)    "I put me into a coma"     (Not in a hospital admission)  Results for orders placed or performed during the hospital encounter of 02/10/16 (from the past 48 hour(s))  I-Stat Arterial Blood Gas, ED - (order at [facility] and [facility] only)     Status: Abnormal   Collection Time: 02/10/16  6:55 PM  Result Value Ref Range   pH, Arterial 7.295 (L) 7.350 - 7.450   pCO2 arterial 68.3 (HH) 35.0 - 45.0 mmHg   pO2, Arterial 71.0 (L) 80.0 - 100.0 mmHg   Bicarbonate 33.2 (H)  20.0 - 24.0 mEq/L   TCO2 35 0 - 100 mmol/L   O2 Saturation 91.0 %   Acid-Base Excess 4.0 (H) 0.0 - 2.0 mmol/L   Patient temperature 98.5 F    Collection site RADIAL, ALLEN'S TEST ACCEPTABLE    Drawn by RT    Sample type ARTERIAL    Comment NOTIFIED PHYSICIAN   CBC with Differential     Status: Abnormal   Collection Time: 02/10/16  7:06 PM  Result Value Ref Range   WBC 11.1 (H) 4.0 - 10.5 K/uL   RBC 4.95 3.87 - 5.11 MIL/uL   Hemoglobin 11.9 (L) 12.0 - 15.0 g/dL   HCT 42.1 36.0 - 46.0 %   MCV 85.1 78.0 - 100.0 fL   MCH 24.0 (L) 26.0 - 34.0 pg   MCHC 28.3 (L) 30.0 - 36.0 g/dL   RDW 15.5 11.5 - 15.5 %   Platelets 256 150 - 400 K/uL   Neutrophils Relative % 82 %   Neutro Abs 9.1 (H) 1.7 - 7.7 K/uL   Lymphocytes Relative 10 %   Lymphs Abs 1.2 0.7 - 4.0 K/uL   Monocytes Relative 7 %   Monocytes Absolute 0.8 0.1 - 1.0 K/uL   Eosinophils Relative 1 %   Eosinophils Absolute 0.1 0.0 - 0.7 K/uL   Basophils  Relative 0 %   Basophils Absolute 0.0 0.0 - 0.1 K/uL  Comprehensive metabolic panel     Status: Abnormal   Collection Time: 02/10/16  7:06 PM  Result Value Ref Range   Sodium 142 135 - 145 mmol/L   Potassium 5.7 (H) 3.5 - 5.1 mmol/L   Chloride 100 (L) 101 - 111 mmol/L   CO2 30 22 - 32 mmol/L   Glucose, Bld 145 (H) 65 - 99 mg/dL   BUN 17 6 - 20 mg/dL   Creatinine, Ser 1.23 (H) 0.44 - 1.00 mg/dL   Calcium 8.8 (L) 8.9 - 10.3 mg/dL   Total Protein 7.0 6.5 - 8.1 g/dL   Albumin 3.0 (L) 3.5 - 5.0 g/dL   AST 16 15 - 41 U/L   ALT 12 (L) 14 - 54 U/L   Alkaline Phosphatase 97 38 - 126 U/L   Total Bilirubin 0.4 0.3 - 1.2 mg/dL   GFR calc non Af Amer 46 (L) >60 mL/min   GFR calc Af Amer 54 (L) >60 mL/min    Comment: (NOTE) The eGFR has been calculated using the CKD EPI equation. This calculation has not been validated in all clinical situations. eGFR's persistently <60 mL/min signify possible Chronic Kidney Disease.    Anion gap 12 5 - 15  Brain natriuretic peptide     Status: Abnormal   Collection Time: 02/10/16  7:06 PM  Result Value Ref Range   B Natriuretic Peptide 333.4 (H) 0.0 - 100.0 pg/mL  I-Stat Troponin, ED (not at [facility])     Status: None   Collection Time: 02/10/16  7:20 PM  Result Value Ref Range   Troponin i, poc 0.00 0.00 - 0.08 ng/mL   Comment 3            Comment: Due to the release kinetics of cTnI, a negative result within the first hours of the onset of symptoms does not rule out myocardial infarction with certainty. If myocardial infarction is still suspected, repeat the test at appropriate intervals.    Dg Chest Portable 1 View  02/10/2016  CLINICAL DATA:  59-year-old female with shortness of breath and increasing lip itchy history   of cancer and CHF. EXAM: PORTABLE CHEST 1 VIEW COMPARISON:  Radiograph dated 12/30/2015 FINDINGS: The lungs are hypovolemic. There is mild diffuse increased vascular and interstitial prominence compatible with mild congestive changes.  There is no focal consolidation, pleural effusion, or pneumothorax. Stable cardiac silhouette. No acute osseous pathology. IMPRESSION: Findings likely represent mild congestive changes. No focal consolidation. Electronically Signed   By: Anner Crete M.D.   On: 02/10/2016 18:57    Review Of Systems Constitutional: Negative for fever, chills, appetite change and fatigue.  HENT: Negative for congestion, ear pain, facial swelling, mouth sores and sore throat.  Eyes: Negative for visual disturbance.  Respiratory: Positive for chest pain and shortness of breath. Negative for cough.  Cardiovascular: Positive for leg swelling. Negative for chest pain and palpitations.  Gastrointestinal: Negative for nausea, vomiting, abdominal pain, diarrhea and blood in stool.  Endocrine: Negative for cold intolerance and heat intolerance.  Genitourinary: Negative for frequency, decreased urine volume and difficulty urinating.  Musculoskeletal: Positive for back pain and neck stiffness.  Skin: Negative for rash.  Neurological: Positive for dizziness, weakness, light-headedness and headaches.  All other systems reviewed and are negative.  Blood pressure 139/74, pulse 93, temperature 99.5 F (37.5 C), temperature source Oral, resp. rate 11, height 5' 5" (1.651 m), weight 131.543 kg (290 lb), SpO2 98 %.  Physical Exam  Constitutional: She appears well-developed and well-nourished. No distress.  HENT: Normocephalic and atraumatic. Right Ear: External ear normal. Left Ear: External ear normal. Nose: Nose normal.  Eyes: Conjunctivae and EOM are normal. Pupils are equal, round, and reactive to light. Right eye exhibits no discharge. Left eye exhibits no discharge. No scleral icterus.  Neck: Normal range of motion. Neck supple.  Cardiovascular: Normal rate, irregular rhythm and normal heart sounds. Exam reveals no gallop and no friction rub.II/VI systolic murmur heard. Pulmonary/Chest: Effort normal. No stridor.  No tachypnea noted. No respiratory distress. She has decreased breath sounds. She has wheezes.  Abdominal: Soft. She exhibits no distension. There is no tenderness.  Musculoskeletal: 2 + bilateral lower extremity pitting edema is present. Neurological: She is awake on calling and has mild jerky movements of all 4 extremities. She is oriented to person only.  Skin: Skin is warm and dry. No rash noted. She is not diaphoretic. No erythema.  Psychiatric: She has a normal mood and affect.   Assessment/Plan Acute on chronic diastolic heart failure Acute on chronic hypoxic, hypercarbic respiratory failure CAD COPD DM, II Morbid obesity Hypothyroidism Restless leg syndrome Obstructive sleep apnea Hypertension Atrial fibrillation with [id number] sore of 4/9  Admit Oxygen Albuterol Treatments Lasix for diastolic heart failure Home medications. Hold sedatives and anxiolytics.  Birdie Riddle, MD  02/10/2016, 9:48 PM

## 2016-02-10 NOTE — ED Notes (Signed)
[facility]- pt coming from home with reported increased shortness of breath as well as lethargy. Pt initially noted to be 85% on 3L which patient uses at home regularly. Pt on 4L on arrival, O2 saturation level of 98% and alert and oriented.

## 2016-02-11 LAB — BASIC METABOLIC PANEL
ANION GAP: 12 (ref 5–15)
BUN: 19 mg/dL (ref 6–20)
CALCIUM: 8.9 mg/dL (ref 8.9–10.3)
CHLORIDE: 97 mmol/L — AB (ref 101–111)
CO2: 33 mmol/L — AB (ref 22–32)
Creatinine, Ser: 1.3 mg/dL — ABNORMAL HIGH (ref 0.44–1.00)
GFR calc non Af Amer: 43 mL/min — ABNORMAL LOW (ref >60)
GFR, EST AFRICAN AMERICAN: 50 mL/min — AB (ref >60)
Glucose, Bld: 183 mg/dL — ABNORMAL HIGH (ref 65–99)
POTASSIUM: 5.1 mmol/L (ref 3.5–5.1)
Sodium: 142 mmol/L (ref 135–145)

## 2016-02-11 LAB — MRSA PCR SCREENING: MRSA BY PCR: NEGATIVE

## 2016-02-11 LAB — CBG MONITORING, ED: Glucose-Capillary: 145 mg/dL — ABNORMAL HIGH (ref 65–99)

## 2016-02-11 MED ORDER — IPRATROPIUM-ALBUTEROL 0.5-2.5 (3) MG/3ML IN SOLN: 3.0000 mL | Freq: Four times a day (QID) | RESPIRATORY_TRACT | Status: DC | PRN

## 2016-02-11 MED ORDER — LORAZEPAM 0.5 MG PO TABS: 0.5000 mg | ORAL_TABLET | Freq: Two times a day (BID) | ORAL | Status: DC

## 2016-02-11 MED ORDER — METHADONE HCL 10 MG PO TABS: 70.0000 mg | ORAL_TABLET | Freq: Every day | ORAL | Status: DC

## 2016-02-11 MED ORDER — IPRATROPIUM-ALBUTEROL 0.5-2.5 (3) MG/3ML IN SOLN
3.0000 mL | Freq: Four times a day (QID) | RESPIRATORY_TRACT | Status: DC
  Administered 2016-02-11: 3 mL via RESPIRATORY_TRACT
  Filled 2016-02-11 (×2): qty 3

## 2016-02-11 MED ORDER — METHADONE HCL 10 MG PO TABS
10.0000 mg | ORAL_TABLET | Freq: Three times a day (TID) | ORAL | Status: DC
  Administered 2016-02-11 – 2016-02-13 (×6): 10 mg via ORAL
  Filled 2016-02-11 (×6): qty 1

## 2016-02-11 NOTE — Care Management Note (Signed)
Case Management Note  Patient Details  Name: CHARLANE HOGLEN MRN: [id number] Date of Birth: [birth year]  Subjective/Objective:     Adm w shortness of breath, chf               Action/Plan: lives at home, has o2-bipap-rw, pt act w [facility] for rn-pt-ot-sw. Butch Penny w [facility] alerted of pt adm   Expected Discharge Date:                  Expected Discharge Plan:  [place]  In-House Referral:     Discharge planning Services  CM Consult  Post Acute Care Choice:  Resumption of Svcs/PTA Provider Choice offered to:     DME Arranged:    DME Agency:     HH Arranged:  RN, PT, OT, Social Work [employer] Agency:  [place]  Status of Service:     Medicare Important Message Given:    Date Medicare IM Given:    Medicare IM give by:    Date Additional Medicare IM Given:    Additional Medicare Important Message give by:     If discussed at [place] of Stay Meetings, dates discussed:    Additional Comments: ur review done  Lacretia Leigh, RN 02/11/2016, 3:03 PM

## 2016-02-11 NOTE — ED Notes (Signed)
Patient meal Tray delivered.

## 2016-02-11 NOTE — ED Notes (Signed)
Pharmacy notified to please send Cardizem and Diamox.

## 2016-02-11 NOTE — ED Notes (Signed)
Attmpted report  

## 2016-02-11 NOTE — Progress Notes (Addendum)
Ref: Birdie Riddle, MD   Subjective:  Awake. Less jerky movements today. T max 99.5 degree F  Objective:  Vital Signs in the last 24 hours: Temp:  [99 F (37.2 C)-99.5 F (37.5 C)] 99 F (37.2 C) (03/03 0651) Pulse Rate:  [58-149] 107 (03/03 1415) Cardiac Rhythm:  [-] Atrial fibrillation (03/03 1600) Resp:  [5-34] 26 (03/03 1600) BP: (93-167)/(36-103) 136/67 mmHg (03/03 1600) SpO2:  [77 %-100 %] 99 % (03/03 1600) Weight:  [131.543 kg (290 lb)-132.949 kg (293 lb 1.6 oz)] 132.949 kg (293 lb 1.6 oz) (03/02 2209)  Physical Exam: BP Readings from Last 1 Encounters:  02/11/16 136/67    Wt Readings from Last 1 Encounters:  02/10/16 132.949 kg (293 lb 1.6 oz)    Weight change:   HEENT: [place]/AT, Eyes-Blue, PERL, EOMI, Conjunctiva-Pink, Sclera-Non-icteric Neck: No JVD, No bruit, Trachea midline. Lungs:  Mild wheezing, Bilateral. Cardiac:  Irregular rhythm, normal S1 and S2, no S3. II/VI systolic murmur. Abdomen:  Soft, non-tender. Extremities:  2 + edema of both lower legs is present. No cyanosis. No clubbing. CNS: AxOx2, Cranial nerves grossly intact, moves all 4 extremities. Right handed. Involuntary movements of both upper and lower extremities. Skin: Warm and dry.   Intake/Output from previous day: 03/02 0701 - 03/03 0700 In: -  Out: 750 [Urine:750]    Lab Results: BMET    Component Value Date/Time   NA 142 02/11/2016 0233   NA 142 02/10/2016 1906   NA 139 01/02/2016 0504   K 5.1 02/11/2016 0233   K 5.7* 02/10/2016 1906   K 4.4 01/02/2016 0504   CL 97* 02/11/2016 0233   CL 100* 02/10/2016 1906   CL 94* 01/02/2016 0504   CO2 33* 02/11/2016 0233   CO2 30 02/10/2016 1906   CO2 37* 01/02/2016 0504   GLUCOSE 183* 02/11/2016 0233   GLUCOSE 145* 02/10/2016 1906   GLUCOSE 112* 01/02/2016 0504   BUN 19 02/11/2016 0233   BUN 17 02/10/2016 1906   BUN 27* 01/02/2016 0504   CREATININE 1.30* 02/11/2016 0233   CREATININE 1.23* 02/10/2016 1906   CREATININE 1.31* 01/02/2016  0504   CALCIUM 8.9 02/11/2016 0233   CALCIUM 8.8* 02/10/2016 1906   CALCIUM 8.9 01/02/2016 0504   GFRNONAA 43* 02/11/2016 0233   GFRNONAA 46* 02/10/2016 1906   GFRNONAA 43* 01/02/2016 0504   GFRAA 50* 02/11/2016 0233   GFRAA 54* 02/10/2016 1906   GFRAA 50* 01/02/2016 0504   CBC    Component Value Date/Time   WBC 11.1* 02/10/2016 1906   RBC 4.95 02/10/2016 1906   HGB 11.9* 02/10/2016 1906   HCT 42.1 02/10/2016 1906   PLT 256 02/10/2016 1906   MCV 85.1 02/10/2016 1906   MCH 24.0* 02/10/2016 1906   MCHC 28.3* 02/10/2016 1906   RDW 15.5 02/10/2016 1906   LYMPHSABS 1.2 02/10/2016 1906   MONOABS 0.8 02/10/2016 1906   EOSABS 0.1 02/10/2016 1906   BASOSABS 0.0 02/10/2016 1906   HEPATIC Function Panel  Recent Labs  12/25/15 1256 02/10/16 1906  PROT 8.2* 7.0   HEMOGLOBIN A1C No components found for: HGA1C,  MPG CARDIAC ENZYMES Lab Results  Component Value Date   CKTOTAL 96 11/12/2007   CKMB 1.8 11/12/2007   TROPONINI <0.30 11/08/2014   TROPONINI <0.30 04/12/2014   TROPONINI <0.30 04/12/2014   BNP No results for input(s): PROBNP in the last 8760 hours. TSH  Recent Labs  12/29/15 0331  TSH 4.122   CHOLESTEROL No results for input(s): CHOL in the  last 8760 hours.  Scheduled Meds: . acetaZOLAMIDE  250 mg Oral BID  . diltiazem  240 mg Oral Daily  . furosemide  40 mg Intravenous Q12H  . levothyroxine  100 mcg Oral QAC breakfast  . loratadine  10 mg Oral Daily  . metoprolol tartrate  75 mg Oral BID  . pantoprazole  40 mg Oral Daily  . rivaroxaban  20 mg Oral Q supper  . sodium chloride flush  3 mL Intravenous Q12H   Continuous Infusions:  PRN Meds:.sodium chloride, acetaminophen, ipratropium-albuterol, prochlorperazine, sodium chloride flush  Assessment/Plan: Acute on chronic diastolic heart failure Acute on chronic hypoxic, hypercarbic respiratory failure CAD COPD DM, II Morbid obesity Hypothyroidism Restless leg syndrome Obstructive sleep  apnea Hypertension Atrial fibrillation with CHA2DS2VASc sore of 4/9  Continue lasix, albuterol treatments and resume BiPAP at home setting. Add small dose methadone q 8 hour to avoid withdrawal.     LOS: 1 day    Dixie Dials  MD  02/11/2016, 5:42 PM

## 2016-02-11 NOTE — ED Notes (Signed)
Spoke with MD advised patient missing Synthroid and zoloft advised he will placed orders.

## 2016-02-11 NOTE — Progress Notes (Signed)
[facility]  Patient Status: Active (receiving services up to time of hospitalization)  [facility] is providing the following services: RN, PT, OT and MSW  If patient discharges after hours, please call [phone].   [name] 02/11/2016, 3:06 PM

## 2016-02-12 LAB — BASIC METABOLIC PANEL
ANION GAP: 9 (ref 5–15)
BUN: 26 mg/dL — ABNORMAL HIGH (ref 6–20)
CHLORIDE: 96 mmol/L — AB (ref 101–111)
CO2: 35 mmol/L — AB (ref 22–32)
CREATININE: 1.33 mg/dL — AB (ref 0.44–1.00)
Calcium: 9 mg/dL (ref 8.9–10.3)
GFR calc non Af Amer: 42 mL/min — ABNORMAL LOW (ref >60)
GFR, EST AFRICAN AMERICAN: 49 mL/min — AB (ref >60)
Glucose, Bld: 142 mg/dL — ABNORMAL HIGH (ref 65–99)
Potassium: 4.7 mmol/L (ref 3.5–5.1)
Sodium: 140 mmol/L (ref 135–145)

## 2016-02-12 MED ORDER — CETYLPYRIDINIUM CHLORIDE 0.05 % MT LIQD
7.0000 mL | Freq: Two times a day (BID) | OROMUCOSAL | Status: DC
  Administered 2016-02-12: 7 mL via OROMUCOSAL

## 2016-02-12 NOTE — Plan of Care (Signed)
Problem: Food- and Nutrition-Related Knowledge Deficit (NB-1.1) Goal: Nutrition education Formal process to instruct or train a patient/client in a skill or to impart knowledge to help patients/clients voluntarily manage or modify food choices and eating behavior to maintain or improve health. Outcome: Completed/Met Date Met:  02/12/16 Nutrition Education Note  RD consulted for nutrition education regarding low sodium diet.  RD provided "Low Sodium Nutrition Therapy" and "Heart Healthy Eating Nutrition Therapy" handouts from the Academy of [employer]etics. Reviewed patient's dietary recall. Provided examples on ways to decrease sodium intake in diet. Discouraged intake of processed foods and use of salt shaker. Encouraged fresh fruits and vegetables as well as whole grain sources of carbohydrates to maximize fiber intake. RD discussed why it is important for patient to adhere to diet recommendations and to limit avoid fast food/resturant foods. Teach back method used.  Expect good compliance.  Body mass index is 48.61 kg/(m^2). Pt meets criteria for morbid obesity based on current BMI.  Current diet order is heart diet, patient is consuming approximately 85-100% of meals at this time. Labs and medications reviewed. No further nutrition interventions warranted at this time. RD contact information provided. If additional nutrition issues arise, please re-consult RD.   Corrin Parker, MS, RD, LDN Pager # [phone] After hours/ weekend pager # [phone]

## 2016-02-12 NOTE — Progress Notes (Signed)
Subjective:  Patient more alert and awake today. Diuresing well and denies any chest pains states breathing has improved. States ran out of medication and was eating a lot of salty for home by her daughter.  Objective:  Vital Signs in the last 24 hours: Temp:  [97.7 F (36.5 C)-98.6 F (37 C)] 97.7 F (36.5 C) (03/04 0736) Pulse Rate:  [89-130] 100 (03/04 0050) Resp:  [12-26] 18 (03/04 0950) BP: (116-141)/(67-99) 132/83 mmHg (03/04 0950) SpO2:  [92 %-100 %] 100 % (03/04 0950) Weight:  [132.5 kg (292 lb 1.8 oz)] 132.5 kg (292 lb 1.8 oz) (03/04 0500)  Intake/Output from previous day: 03/03 0701 - 03/04 0700 In: 360 [P.O.:360] Out: 4900 [Urine:4900] Intake/Output from this shift: Total I/O In: -  Out: 600 [Urine:600]  Physical Exam: Neck: no adenopathy, no carotid bruit, no JVD and supple, symmetrical, trachea midline Lungs: Decreased breath sound at bases with faint rales Heart: regularly irregular rhythm, S1, S2 normal and 2/6 systolic murmur and soft S3 gallop noted Abdomen: soft, non-tender; bowel sounds normal; no masses,  no organomegaly Extremities: No clubbing cyanosis 1+ edema noted  Lab Results:  Recent Labs  02/10/16 1906  WBC 11.1*  HGB 11.9*  PLT 256    Recent Labs  02/11/16 0233 02/12/16 0344  NA 142 140  K 5.1 4.7  CL 97* 96*  CO2 33* 35*  GLUCOSE 183* 142*  BUN 19 26*  CREATININE 1.30* 1.33*   No results for input(s): TROPONINI in the last 72 hours.  Invalid input(s): CK, MB Hepatic Function Panel  Recent Labs  02/10/16 1906  PROT 7.0  ALBUMIN 3.0*  AST 16  ALT 12*  ALKPHOS 97  BILITOT 0.4   No results for input(s): CHOL in the last 72 hours. No results for input(s): PROTIME in the last 72 hours.  Imaging: Imaging results have been reviewed and Dg Chest Portable 1 View  02/10/2016  CLINICAL DATA:  60 year old female with shortness of breath and increasing lip itchy history of cancer and CHF. EXAM: PORTABLE CHEST 1 VIEW COMPARISON:   Radiograph dated 12/30/2015 FINDINGS: The lungs are hypovolemic. There is mild diffuse increased vascular and interstitial prominence compatible with mild congestive changes. There is no focal consolidation, pleural effusion, or pneumothorax. Stable cardiac silhouette. No acute osseous pathology. IMPRESSION: Findings likely represent mild congestive changes. No focal consolidation. Electronically Signed   By: Anner Crete M.D.   On: 02/10/2016 18:57    Cardiac Studies:  Assessment/Plan:  Resolving Acute on chronic diastolic heart failure Status post Acute on chronic hypoxic, hypercarbic respiratory failure CAD COPD DM, II Morbid obesity Hypothyroidism Restless leg syndrome Obstructive sleep apnea Hypertension Atrial fibrillation/flutter with CHA2DS2VASc sore of 4/9 Plan Continue present management Check labs in a.m. May transfer to telemetry   LOS: 2 days    [name] 02/12/2016, 11:49 AM

## 2016-02-12 NOTE — Progress Notes (Signed)
Pt. Stated the pressure on the cpap was too much lastnight. RT cut pt. Auro mode pressure to auto titrate max 15, min 4. Pt. Tolerating but experiencing some claustrophobia. RT to monitor.

## 2016-02-13 LAB — BRAIN NATRIURETIC PEPTIDE: B NATRIURETIC PEPTIDE 5: 111.9 pg/mL — AB (ref 0.0–100.0)

## 2016-02-13 LAB — BASIC METABOLIC PANEL
ANION GAP: 12 (ref 5–15)
BUN: 28 mg/dL — AB (ref 6–20)
CALCIUM: 9.3 mg/dL (ref 8.9–10.3)
CO2: 38 mmol/L — ABNORMAL HIGH (ref 22–32)
Chloride: 92 mmol/L — ABNORMAL LOW (ref 101–111)
Creatinine, Ser: 1.53 mg/dL — ABNORMAL HIGH (ref 0.44–1.00)
GFR calc Af Amer: 41 mL/min — ABNORMAL LOW (ref >60)
GFR, EST NON AFRICAN AMERICAN: 36 mL/min — AB (ref >60)
GLUCOSE: 114 mg/dL — AB (ref 65–99)
Potassium: 4.2 mmol/L (ref 3.5–5.1)
SODIUM: 142 mmol/L (ref 135–145)

## 2016-02-13 LAB — CBC
HCT: 42.2 % (ref 36.0–46.0)
Hemoglobin: 12.3 g/dL (ref 12.0–15.0)
MCH: 24.2 pg — AB (ref 26.0–34.0)
MCHC: 29.1 g/dL — AB (ref 30.0–36.0)
MCV: 83.1 fL (ref 78.0–100.0)
Platelets: 295 10*3/uL (ref 150–400)
RBC: 5.08 MIL/uL (ref 3.87–5.11)
RDW: 15.6 % — ABNORMAL HIGH (ref 11.5–15.5)
WBC: 10.2 10*3/uL (ref 4.0–10.5)

## 2016-02-13 MED ORDER — LIVING BETTER WITH HEART FAILURE BOOK: Freq: Once | Status: DC

## 2016-02-13 MED ORDER — OFF THE BEAT BOOK
Freq: Once | Status: AC
  Administered 2016-02-13: 11:00:00
  Filled 2016-02-13: qty 1

## 2016-02-13 NOTE — Discharge Summary (Signed)
Discharge summary dictated on 02/13/2016 dictation number rates [id number]

## 2016-02-13 NOTE — Discharge Instructions (Signed)

## 2016-02-13 NOTE — Progress Notes (Signed)
DC orders received.  Patient stable with no S/S of distress.  Medication and discharge information reviewed with patient and patient's family member.  Patient DC home with family. [name], Ardeth Sportsman

## 2016-02-13 NOTE — Discharge Summary (Signed)
[name], [name]               ACCOUNT NO.:  [id number]  MEDICAL RECORD NO.:  [id number]  LOCATION:  2H20C                        FACILITY:  [facility]  PHYSICIAN:  Geovanny Sartin N. Terrence Dupont, M.D. DATE OF BIRTH:  03-17-1956  DATE OF ADMISSION:  02/10/2016 DATE OF DISCHARGE:  02/13/2016                              DISCHARGE SUMMARY   ADMITTING DIAGNOSES: 1. Acute-on-chronic diastolic congestive heart failure. 2. Acute-on-chronic hypoxic hypercarbic respiratory failure. 3. Coronary artery disease. 4. Chronic obstructive pulmonary disease. 5. Diabetes mellitus type 2. 6. Morbid obesity. 7. Hypothyroidism. 8. Restless legs syndrome. 9. Obstructive sleep apnea. 10.Hypertension. 11.Chronic atrial fibrillation, with CHADS-VASC score of 4.  FINAL DIAGNOSES: 1. Status post decompensated diastolic heart failure. 2. Status post acute-on-chronic hypoxic respiratory failure. 3. Coronary artery disease. 4. Chronic obstructive pulmonary disease. 5. Diabetes mellitus. 6. Morbid obesity. 7. Hypothyroidism. 8. Restless legs syndrome. 9. Obstructive sleep apnea. 10.Hypertension. 11.Chronic atrial fibrillation, with CHADS-VASC score of 4.  DISCHARGE MEDICATIONS: 1. Acetazolamide 250 mg twice daily. 2. B complex 1 tablet daily. 3. Biotin 1 tablet twice daily. 4. Zyrtec 10 mg as needed. 5. Lotrisone apply locally as before. 6. Diltiazem 240 mg 1 capsule daily. 7. Furosemide 80 mg 1 tablet daily. 8. Levothyroxine 100 mcg daily. 9. Metoprolol tartrate 75 mg twice daily. 10.Nystatin powder apply locally as before. 11.Protonix 40 mg daily. 12.Xarelto 20 mg daily. 13.Zoloft 200 mg daily. The patient has been advised to stop the methadone.  Heart failure instructions have been given.  The patient has been advised to monitor weight daily and refrain from salty food.  FOLLOWUP:  With Dr. Doylene Canard in 1 week.  CONDITION AT DISCHARGE:  Stable.  BRIEF HISTORY AND HOSPITAL COURSE:  [name] is a  60 year old female with past medical history significant for obstructive sleep apnea, diastolic heart failure, diabetes mellitus, restless legs syndrome, acute-on-chronic respiratory failure with hypercapnia, endometrial cancer, morbid obesity, hypothyroidism, chronic lower back pain, ADD, coronary artery disease, came to the ER with altered mental status.  Her oxygen saturation was 85% on 3 L which improved to 98% on 4 L.  The patient ran out of her medication for last 2 days and states has been eating salty food cooked by her daughter.  PHYSICAL EXAMINATION:  GENERAL:  She was awake, but drowsy, hemodynamically stable. NECK:  Supple.  No JVD.  No bruit. LUNGS:  Decreased breath sounds were noted. CARDIOVASCULAR:  Irregularly irregular.  There was 2/6 systolic murmur. ABDOMEN:  Soft, obese, distended. EXTREMITIES:  There is no clubbing, cyanosis.  There was 2+ edema. NEUROLOGIC:  Grossly intact.  LABORATORY DATA:  Her ABGs, pH was 7.29, pCO2 of 68, pO2 of 71.  Sodium was 142, potassium 5.7, BUN 17, creatinine 1.23.  BNP was 333, repeat BNP today is 111.  Hemoglobin was 11.9, hematocrit 42.1, white count of 11.1.  Today, hemoglobin is 12.3, hematocrit 42.2, white count of 10.2. Troponin I was negative.  EKG showed atrial flutter with variable block, nonspecific ST-T wave changes.  BRIEF HOSPITAL COURSE:  The patient was admitted to step-down unit.  MI was ruled out by serial enzymes.  The patient was started on IV diuretics with good diuresis.  The patient denies  any chest pain during the hospital stay, is eager to go home.  States she was eating lot of salty food prepared by her daughter and ran out of medication for 2 days.  The patient's leg swelling is completely resolved.  Her breathing is back to normal.  The patient will be discharged home, will be followed up by Dr. Doylene Canard in 1 week.  The patient has been advised to refrain from methadone.  She had hypercarbic hypoxic  respiratory failure.  The patient also was advised regarding lifestyle changes, compliance with diet, medication, and follow up.     Allegra Lai. Terrence Dupont, M.D.     MNH/MEDQ  D:  02/13/2016  T:  02/13/2016  Job:  [id number]

## 2016-02-15 DIAGNOSIS — I5033 Acute on chronic diastolic (congestive) heart failure: Secondary | ICD-10-CM | POA: Diagnosis not present

## 2016-02-15 DIAGNOSIS — I10 Essential (primary) hypertension: Secondary | ICD-10-CM | POA: Diagnosis not present

## 2016-02-15 DIAGNOSIS — I4891 Unspecified atrial fibrillation: Secondary | ICD-10-CM | POA: Diagnosis not present

## 2016-02-15 DIAGNOSIS — E119 Type 2 diabetes mellitus without complications: Secondary | ICD-10-CM | POA: Diagnosis not present

## 2016-02-15 DIAGNOSIS — G4733 Obstructive sleep apnea (adult) (pediatric): Secondary | ICD-10-CM | POA: Diagnosis not present

## 2016-02-15 DIAGNOSIS — I2510 Atherosclerotic heart disease of native coronary artery without angina pectoris: Secondary | ICD-10-CM | POA: Diagnosis not present

## 2016-02-16 ENCOUNTER — Ambulatory Visit: Payer: Medicare Other | Admitting: Pulmonary Disease

## 2016-02-16 DIAGNOSIS — I10 Essential (primary) hypertension: Secondary | ICD-10-CM | POA: Diagnosis not present

## 2016-02-16 DIAGNOSIS — I4891 Unspecified atrial fibrillation: Secondary | ICD-10-CM | POA: Diagnosis not present

## 2016-02-16 DIAGNOSIS — I5033 Acute on chronic diastolic (congestive) heart failure: Secondary | ICD-10-CM | POA: Diagnosis not present

## 2016-02-16 DIAGNOSIS — G4733 Obstructive sleep apnea (adult) (pediatric): Secondary | ICD-10-CM | POA: Diagnosis not present

## 2016-02-16 DIAGNOSIS — I2510 Atherosclerotic heart disease of native coronary artery without angina pectoris: Secondary | ICD-10-CM | POA: Diagnosis not present

## 2016-02-16 DIAGNOSIS — E119 Type 2 diabetes mellitus without complications: Secondary | ICD-10-CM | POA: Diagnosis not present

## 2016-02-18 ENCOUNTER — Encounter: Payer: Self-pay | Admitting: Pulmonary Disease

## 2016-02-18 ENCOUNTER — Ambulatory Visit (INDEPENDENT_AMBULATORY_CARE_PROVIDER_SITE_OTHER): Payer: Medicare Other | Admitting: Pulmonary Disease

## 2016-02-18 VITALS — BP 130/88 | HR 92 | Ht 65.0 in | Wt 281.0 lb

## 2016-02-18 DIAGNOSIS — J9612 Chronic respiratory failure with hypercapnia: Secondary | ICD-10-CM | POA: Diagnosis not present

## 2016-02-18 DIAGNOSIS — E662 Morbid (severe) obesity with alveolar hypoventilation: Secondary | ICD-10-CM | POA: Diagnosis not present

## 2016-02-18 DIAGNOSIS — G4733 Obstructive sleep apnea (adult) (pediatric): Secondary | ICD-10-CM

## 2016-02-18 NOTE — Progress Notes (Signed)
Current Outpatient Prescriptions on File Prior to Visit  Medication Sig  . acetaZOLAMIDE (DIAMOX) 250 MG tablet Take 1 tablet (250 mg total) by mouth 2 (two) times daily.  Marland Kitchen b complex vitamins tablet Take 1 tablet by mouth daily.   Marland Kitchen BIOTIN PO Take 1 tablet by mouth 2 (two) times daily.  . cetirizine (ZYRTEC) 10 MG tablet Take 10 mg by mouth at bedtime.  . clotrimazole-betamethasone (LOTRISONE) cream Apply 1 application topically 2 (two) times daily.  Marland Kitchen diltiazem (DILACOR XR) 240 MG 24 hr capsule Take 1 capsule (240 mg total) by mouth daily.  . furosemide (LASIX) 80 MG tablet Take 1 tablet (80 mg total) by mouth daily.  Marland Kitchen levothyroxine (SYNTHROID, LEVOTHROID) 100 MCG tablet Take 1 tablet (100 mcg total) by mouth daily before breakfast.  . Metoprolol Tartrate 75 MG TABS Take 75 mg by mouth 2 (two) times daily.  Marland Kitchen nystatin (MYCOSTATIN/NYSTOP) 100000 UNIT/GM POWD Apply 1 g topically 2 (two) times daily as needed (for rash).  . pantoprazole (PROTONIX) 40 MG tablet Take 40 mg by mouth daily.  . potassium chloride SA (K-DUR,KLOR-CON) 20 MEQ tablet Take 20 mEq by mouth 2 (two) times daily.  . prochlorperazine (COMPAZINE) 10 MG tablet Take 10 mg by mouth every 6 (six) hours as needed for nausea or vomiting.  . rivaroxaban (XARELTO) 20 MG TABS tablet Take 1 tablet (20 mg total) by mouth daily with supper.  . sertraline (ZOLOFT) 100 MG tablet Take 200 mg by mouth at bedtime.    No current facility-administered medications on file prior to visit.    Chief Complaint  Patient presents with  . Follow-up    Wears O2 24/7- 3 Liters. Has not worn CPAP x 1 week. Pt has been seen in hospital several times.     Tests Auto BPAP 11/13/11 to 11/26/11>>Used on 12 of 14 days with average 2 hrs 17 min.  Average AHI 28 (8.6 obstructive, 19.6 central) with median setting 13/9 cm H2O, 95th percentile setting 15/11 cm H2O. PSG 03/26/12>>AHI 36.6, SpO2 low 91%.  Test done with 2 liters oxygen.  PLMI 3.1.  PVC's,  bigeminy. BiPAP 05/23/12 to 07/08/12>>Used on 47 of 47 nights with average 4 hrs 48 min. Average AHI 11 with BiPAP 13/9 cm H2O. ABG 07/07/13 >> pH 7.34, PaCO2 59.4, PaO2 82 Echo 12/29/15 >> EF 45 to [id number], grade 1 diastolic dysfx  Past medical history DM, RLS, Depression, Hypothyroidism, GERD, ADD, chronic back pain, CAD, HTN, combined CHF, Endometrial cancer  PSHx, Medications, Allergies, Fhx, Shx reviewed.  Vital signs BP 130/88 mmHg  Pulse 92  Ht 5\' 5"  (1.651 m)  Wt 281 lb (127.461 kg)  BMI 46.76 kg/m2  SpO2 96%  History of Present Illness: [name] is a 60 y.o. female OSA/OHS.  Since I last saw her, she was in hospital twice.  They gave her lots of diuretics and her swelling got better.  She still gets winded with activity and feels like she needs to keep her oxygen set up.  She has not been using BiPAP recently >> she thought she needed to have this adjusted every month in order to use it.  She is not having cough, wheeze, or chest pain.  Physical Exam: General - Obese, using supplemental oxygen HEENT - no sinus tenderness, no oral exudate, no LAN Cardiac - s1s2 regular Chest - no wheeze Abdomen - soft, nontender Extremities - no edema Skin - no rashes Neurologic - normal strength Psychiatric - normal mood, behavior  Assessment/Plan:  Obstructive sleep apnea. Plan: - advised her to resume using BiPAP - will get copy of her download and call her with results  Chronic hypoxic/hypercapnic respiratory failure 2nd to obesity hypoventilation syndrome. Plan: - she was able to maintain her oxygen on room air with exertion today - will check room air ABG to determine if she qualifies for home oxygen during the day  - she might need overnight oximetry or in lab titration study to determine if she can continue with supplemental oxygen at night  Obesity. Plan: - discussed importance of weight loss  Combined CHF. Plan: - f/u with cardiology  Chesley Mires, MD [facility]  Pulmonary/Critical Care 02/18/2016, 4:37 PM Pager:  [phone]

## 2016-02-18 NOTE — Patient Instructions (Addendum)
Follow up in 4 months Will schedule ABG to be done at [facility] next week. Will call with results.

## 2016-02-21 ENCOUNTER — Telehealth: Payer: Self-pay | Admitting: Pulmonary Disease

## 2016-02-21 ENCOUNTER — Ambulatory Visit (HOSPITAL_COMMUNITY)
Admission: RE | Admit: 2016-02-21 | Discharge: 2016-02-21 | Disposition: A | Discharge status: Home / Self Care | Payer: Medicare Other | Source: Ambulatory Visit | Attending: Pulmonary Disease | Admitting: Pulmonary Disease

## 2016-02-21 DIAGNOSIS — J9612 Chronic respiratory failure with hypercapnia: Secondary | ICD-10-CM

## 2016-02-21 DIAGNOSIS — Z6841 Body Mass Index (BMI) 40.0 and over, adult: Secondary | ICD-10-CM | POA: Insufficient documentation

## 2016-02-21 DIAGNOSIS — E662 Morbid (severe) obesity with alveolar hypoventilation: Secondary | ICD-10-CM | POA: Diagnosis not present

## 2016-02-21 DIAGNOSIS — J9611 Chronic respiratory failure with hypoxia: Secondary | ICD-10-CM | POA: Insufficient documentation

## 2016-02-21 DIAGNOSIS — R0902 Hypoxemia: Secondary | ICD-10-CM

## 2016-02-21 LAB — BLOOD GAS, ARTERIAL
Acid-Base Excess: 4.1 mmol/L — ABNORMAL HIGH (ref 0.0–2.0)
Bicarbonate: 30.1 mEq/L — ABNORMAL HIGH (ref 20.0–24.0)
DRAWN BY: 257701
FIO2: 0.21
O2 SAT: 86.7 %
PH ART: 7.367 (ref 7.350–7.450)
Patient temperature: 98.6
TCO2: 27.2 mmol/L (ref 0–100)
pCO2 arterial: 53.9 mmHg — ABNORMAL HIGH (ref 35.0–45.0)
pO2, Arterial: 54.9 mmHg — ABNORMAL LOW (ref 80.0–100.0)

## 2016-02-21 NOTE — Telephone Encounter (Signed)
Pt calling about msg that was left for her.[name]

## 2016-02-21 NOTE — Telephone Encounter (Signed)
Spoke with Stanton Kidney at [facility] to clarify ABG order.  Patient can arrive anytime to admitting today before 430 for ABG.  LM x 1 for pt to make aware.

## 2016-02-21 NOTE — Addendum Note (Signed)
Addended by: Virl Cagey on: 02/21/2016 11:00 AM   Modules accepted: Orders

## 2016-02-21 NOTE — Telephone Encounter (Signed)
Called spoke with pt. She reports she has all the info she needs and nothing further needed

## 2016-02-22 DIAGNOSIS — E119 Type 2 diabetes mellitus without complications: Secondary | ICD-10-CM | POA: Diagnosis not present

## 2016-02-22 DIAGNOSIS — I5033 Acute on chronic diastolic (congestive) heart failure: Secondary | ICD-10-CM | POA: Diagnosis not present

## 2016-02-22 DIAGNOSIS — I2510 Atherosclerotic heart disease of native coronary artery without angina pectoris: Secondary | ICD-10-CM | POA: Diagnosis not present

## 2016-02-22 DIAGNOSIS — G4733 Obstructive sleep apnea (adult) (pediatric): Secondary | ICD-10-CM | POA: Diagnosis not present

## 2016-02-22 DIAGNOSIS — I10 Essential (primary) hypertension: Secondary | ICD-10-CM | POA: Diagnosis not present

## 2016-02-22 DIAGNOSIS — I4891 Unspecified atrial fibrillation: Secondary | ICD-10-CM | POA: Diagnosis not present

## 2016-02-23 DIAGNOSIS — G4733 Obstructive sleep apnea (adult) (pediatric): Secondary | ICD-10-CM | POA: Diagnosis not present

## 2016-02-23 DIAGNOSIS — I4891 Unspecified atrial fibrillation: Secondary | ICD-10-CM | POA: Diagnosis not present

## 2016-02-23 DIAGNOSIS — I2510 Atherosclerotic heart disease of native coronary artery without angina pectoris: Secondary | ICD-10-CM | POA: Diagnosis not present

## 2016-02-23 DIAGNOSIS — I10 Essential (primary) hypertension: Secondary | ICD-10-CM | POA: Diagnosis not present

## 2016-02-23 DIAGNOSIS — E119 Type 2 diabetes mellitus without complications: Secondary | ICD-10-CM | POA: Diagnosis not present

## 2016-02-23 DIAGNOSIS — I5033 Acute on chronic diastolic (congestive) heart failure: Secondary | ICD-10-CM | POA: Diagnosis not present

## 2016-02-24 DIAGNOSIS — I2510 Atherosclerotic heart disease of native coronary artery without angina pectoris: Secondary | ICD-10-CM | POA: Diagnosis not present

## 2016-02-24 DIAGNOSIS — I10 Essential (primary) hypertension: Secondary | ICD-10-CM | POA: Diagnosis not present

## 2016-02-24 DIAGNOSIS — E119 Type 2 diabetes mellitus without complications: Secondary | ICD-10-CM | POA: Diagnosis not present

## 2016-02-24 DIAGNOSIS — I4891 Unspecified atrial fibrillation: Secondary | ICD-10-CM | POA: Diagnosis not present

## 2016-02-24 DIAGNOSIS — G4733 Obstructive sleep apnea (adult) (pediatric): Secondary | ICD-10-CM | POA: Diagnosis not present

## 2016-02-24 DIAGNOSIS — I5033 Acute on chronic diastolic (congestive) heart failure: Secondary | ICD-10-CM | POA: Diagnosis not present

## 2016-02-25 ENCOUNTER — Telehealth: Payer: Self-pay | Admitting: Pulmonary Disease

## 2016-02-25 NOTE — Telephone Encounter (Signed)
Dr Halford Chessman signed forms/order for O2 from [facility] - he did not sign on the correct page where the signature line is.  Called and LM for Melissa/[facility] to see if the signature on the first page can be accepted or if it has to be signed on the signature line.  If unable to accept forms signed as is we will have to get DOD to sign in VS absence (vacation for the next week).  Will await call back from [facility].

## 2016-02-28 NOTE — Telephone Encounter (Signed)
LM for Melissa/[facility] x 2

## 2016-02-29 DIAGNOSIS — I10 Essential (primary) hypertension: Secondary | ICD-10-CM | POA: Diagnosis not present

## 2016-02-29 DIAGNOSIS — E119 Type 2 diabetes mellitus without complications: Secondary | ICD-10-CM | POA: Diagnosis not present

## 2016-02-29 DIAGNOSIS — G4733 Obstructive sleep apnea (adult) (pediatric): Secondary | ICD-10-CM | POA: Diagnosis not present

## 2016-02-29 DIAGNOSIS — I2510 Atherosclerotic heart disease of native coronary artery without angina pectoris: Secondary | ICD-10-CM | POA: Diagnosis not present

## 2016-02-29 DIAGNOSIS — I4891 Unspecified atrial fibrillation: Secondary | ICD-10-CM | POA: Diagnosis not present

## 2016-02-29 DIAGNOSIS — I5033 Acute on chronic diastolic (congestive) heart failure: Secondary | ICD-10-CM | POA: Diagnosis not present

## 2016-02-29 NOTE — Telephone Encounter (Signed)
Spoke with [name]/[facility], states that they do need Dr Juanetta Gosling signature on this specific page. [name] states that these forms are not needed until the middle of April so we can wait for VS to return from vacation to sign. [name] states that nothing is going to happen with the paitent's O2 between now and then.  Will hold in my box to wait on VS return.

## 2016-03-01 DIAGNOSIS — E119 Type 2 diabetes mellitus without complications: Secondary | ICD-10-CM | POA: Diagnosis not present

## 2016-03-01 DIAGNOSIS — G4733 Obstructive sleep apnea (adult) (pediatric): Secondary | ICD-10-CM | POA: Diagnosis not present

## 2016-03-01 DIAGNOSIS — I5033 Acute on chronic diastolic (congestive) heart failure: Secondary | ICD-10-CM | POA: Diagnosis not present

## 2016-03-01 DIAGNOSIS — I10 Essential (primary) hypertension: Secondary | ICD-10-CM | POA: Diagnosis not present

## 2016-03-01 DIAGNOSIS — I2510 Atherosclerotic heart disease of native coronary artery without angina pectoris: Secondary | ICD-10-CM | POA: Diagnosis not present

## 2016-03-01 DIAGNOSIS — I4891 Unspecified atrial fibrillation: Secondary | ICD-10-CM | POA: Diagnosis not present

## 2016-03-02 DIAGNOSIS — E119 Type 2 diabetes mellitus without complications: Secondary | ICD-10-CM | POA: Diagnosis not present

## 2016-03-02 DIAGNOSIS — I5033 Acute on chronic diastolic (congestive) heart failure: Secondary | ICD-10-CM | POA: Diagnosis not present

## 2016-03-02 DIAGNOSIS — I2510 Atherosclerotic heart disease of native coronary artery without angina pectoris: Secondary | ICD-10-CM | POA: Diagnosis not present

## 2016-03-02 DIAGNOSIS — G4733 Obstructive sleep apnea (adult) (pediatric): Secondary | ICD-10-CM | POA: Diagnosis not present

## 2016-03-02 DIAGNOSIS — I10 Essential (primary) hypertension: Secondary | ICD-10-CM | POA: Diagnosis not present

## 2016-03-02 DIAGNOSIS — I4891 Unspecified atrial fibrillation: Secondary | ICD-10-CM | POA: Diagnosis not present

## 2016-03-06 NOTE — Telephone Encounter (Signed)
Forms signed by Dr Halford Chessman and faxed back to [facility] to number listed on form [phone] Called and LM x1 for Melissa/[facility] to make aware.

## 2016-03-07 DIAGNOSIS — E039 Hypothyroidism, unspecified: Secondary | ICD-10-CM | POA: Diagnosis not present

## 2016-03-07 DIAGNOSIS — I2510 Atherosclerotic heart disease of native coronary artery without angina pectoris: Secondary | ICD-10-CM | POA: Diagnosis not present

## 2016-03-07 DIAGNOSIS — Z87891 Personal history of nicotine dependence: Secondary | ICD-10-CM | POA: Diagnosis not present

## 2016-03-07 DIAGNOSIS — I10 Essential (primary) hypertension: Secondary | ICD-10-CM | POA: Diagnosis not present

## 2016-03-07 DIAGNOSIS — F329 Major depressive disorder, single episode, unspecified: Secondary | ICD-10-CM | POA: Diagnosis not present

## 2016-03-07 DIAGNOSIS — J449 Chronic obstructive pulmonary disease, unspecified: Secondary | ICD-10-CM | POA: Diagnosis not present

## 2016-03-07 DIAGNOSIS — K219 Gastro-esophageal reflux disease without esophagitis: Secondary | ICD-10-CM | POA: Diagnosis not present

## 2016-03-07 DIAGNOSIS — E119 Type 2 diabetes mellitus without complications: Secondary | ICD-10-CM | POA: Diagnosis not present

## 2016-03-07 DIAGNOSIS — I5033 Acute on chronic diastolic (congestive) heart failure: Secondary | ICD-10-CM | POA: Diagnosis not present

## 2016-03-07 DIAGNOSIS — I4891 Unspecified atrial fibrillation: Secondary | ICD-10-CM | POA: Diagnosis not present

## 2016-03-07 DIAGNOSIS — J9620 Acute and chronic respiratory failure, unspecified whether with hypoxia or hypercapnia: Secondary | ICD-10-CM | POA: Diagnosis not present

## 2016-03-07 DIAGNOSIS — Z8701 Personal history of pneumonia (recurrent): Secondary | ICD-10-CM | POA: Diagnosis not present

## 2016-03-07 DIAGNOSIS — G4733 Obstructive sleep apnea (adult) (pediatric): Secondary | ICD-10-CM | POA: Diagnosis not present

## 2016-03-07 NOTE — Telephone Encounter (Signed)
Noted. Will place forms to scan.  Nothing further needed.

## 2016-03-07 NOTE — Telephone Encounter (Signed)
[name] got the paperwork and it looks good [phone]

## 2016-03-08 DIAGNOSIS — E119 Type 2 diabetes mellitus without complications: Secondary | ICD-10-CM | POA: Diagnosis not present

## 2016-03-08 DIAGNOSIS — J449 Chronic obstructive pulmonary disease, unspecified: Secondary | ICD-10-CM | POA: Diagnosis not present

## 2016-03-08 DIAGNOSIS — I4891 Unspecified atrial fibrillation: Secondary | ICD-10-CM | POA: Diagnosis not present

## 2016-03-08 DIAGNOSIS — I5033 Acute on chronic diastolic (congestive) heart failure: Secondary | ICD-10-CM | POA: Diagnosis not present

## 2016-03-08 DIAGNOSIS — G4733 Obstructive sleep apnea (adult) (pediatric): Secondary | ICD-10-CM | POA: Diagnosis not present

## 2016-03-08 DIAGNOSIS — I2510 Atherosclerotic heart disease of native coronary artery without angina pectoris: Secondary | ICD-10-CM | POA: Diagnosis not present

## 2016-03-15 DIAGNOSIS — I5033 Acute on chronic diastolic (congestive) heart failure: Secondary | ICD-10-CM | POA: Diagnosis not present

## 2016-03-15 DIAGNOSIS — I2510 Atherosclerotic heart disease of native coronary artery without angina pectoris: Secondary | ICD-10-CM | POA: Diagnosis not present

## 2016-03-15 DIAGNOSIS — J449 Chronic obstructive pulmonary disease, unspecified: Secondary | ICD-10-CM | POA: Diagnosis not present

## 2016-03-15 DIAGNOSIS — G4733 Obstructive sleep apnea (adult) (pediatric): Secondary | ICD-10-CM | POA: Diagnosis not present

## 2016-03-15 DIAGNOSIS — E119 Type 2 diabetes mellitus without complications: Secondary | ICD-10-CM | POA: Diagnosis not present

## 2016-03-15 DIAGNOSIS — I4891 Unspecified atrial fibrillation: Secondary | ICD-10-CM | POA: Diagnosis not present

## 2016-03-22 DIAGNOSIS — I2510 Atherosclerotic heart disease of native coronary artery without angina pectoris: Secondary | ICD-10-CM | POA: Diagnosis not present

## 2016-03-22 DIAGNOSIS — E119 Type 2 diabetes mellitus without complications: Secondary | ICD-10-CM | POA: Diagnosis not present

## 2016-03-22 DIAGNOSIS — I4891 Unspecified atrial fibrillation: Secondary | ICD-10-CM | POA: Diagnosis not present

## 2016-03-22 DIAGNOSIS — J449 Chronic obstructive pulmonary disease, unspecified: Secondary | ICD-10-CM | POA: Diagnosis not present

## 2016-03-22 DIAGNOSIS — I5033 Acute on chronic diastolic (congestive) heart failure: Secondary | ICD-10-CM | POA: Diagnosis not present

## 2016-03-22 DIAGNOSIS — G4733 Obstructive sleep apnea (adult) (pediatric): Secondary | ICD-10-CM | POA: Diagnosis not present

## 2016-03-29 DIAGNOSIS — I2510 Atherosclerotic heart disease of native coronary artery without angina pectoris: Secondary | ICD-10-CM | POA: Diagnosis not present

## 2016-03-29 DIAGNOSIS — G4733 Obstructive sleep apnea (adult) (pediatric): Secondary | ICD-10-CM | POA: Diagnosis not present

## 2016-03-29 DIAGNOSIS — I5033 Acute on chronic diastolic (congestive) heart failure: Secondary | ICD-10-CM | POA: Diagnosis not present

## 2016-03-29 DIAGNOSIS — E119 Type 2 diabetes mellitus without complications: Secondary | ICD-10-CM | POA: Diagnosis not present

## 2016-03-29 DIAGNOSIS — J449 Chronic obstructive pulmonary disease, unspecified: Secondary | ICD-10-CM | POA: Diagnosis not present

## 2016-03-29 DIAGNOSIS — I4891 Unspecified atrial fibrillation: Secondary | ICD-10-CM | POA: Diagnosis not present

## 2016-04-24 DIAGNOSIS — J449 Chronic obstructive pulmonary disease, unspecified: Secondary | ICD-10-CM | POA: Diagnosis not present

## 2016-04-24 DIAGNOSIS — H109 Unspecified conjunctivitis: Secondary | ICD-10-CM | POA: Diagnosis not present

## 2016-04-24 DIAGNOSIS — J189 Pneumonia, unspecified organism: Secondary | ICD-10-CM | POA: Diagnosis not present

## 2016-04-24 DIAGNOSIS — F329 Major depressive disorder, single episode, unspecified: Secondary | ICD-10-CM | POA: Diagnosis not present

## 2016-04-24 DIAGNOSIS — Z87898 Personal history of other specified conditions: Secondary | ICD-10-CM | POA: Diagnosis not present

## 2016-04-24 DIAGNOSIS — I10 Essential (primary) hypertension: Secondary | ICD-10-CM | POA: Diagnosis not present

## 2016-04-24 DIAGNOSIS — R05 Cough: Secondary | ICD-10-CM | POA: Diagnosis not present

## 2016-04-26 DIAGNOSIS — M545 Low back pain: Secondary | ICD-10-CM | POA: Diagnosis not present

## 2016-04-26 DIAGNOSIS — M170 Bilateral primary osteoarthritis of knee: Secondary | ICD-10-CM | POA: Diagnosis not present

## 2016-05-09 DIAGNOSIS — I10 Essential (primary) hypertension: Secondary | ICD-10-CM | POA: Diagnosis not present

## 2016-05-09 DIAGNOSIS — I2510 Atherosclerotic heart disease of native coronary artery without angina pectoris: Secondary | ICD-10-CM | POA: Diagnosis not present

## 2016-05-09 DIAGNOSIS — I482 Chronic atrial fibrillation: Secondary | ICD-10-CM | POA: Diagnosis not present

## 2016-05-09 DIAGNOSIS — E1142 Type 2 diabetes mellitus with diabetic polyneuropathy: Secondary | ICD-10-CM | POA: Diagnosis not present

## 2016-05-26 IMAGING — CR DG CHEST 1V PORT
1 series · 1 of 1 positions shown · non-contrast
Comparison: Radiograph dated 12/30/2015

CLINICAL DATA: 59-year-old female with shortness of breath and
increasing lip itchy history of cancer and CHF.

EXAM:
PORTABLE CHEST 1 VIEW

[AP]
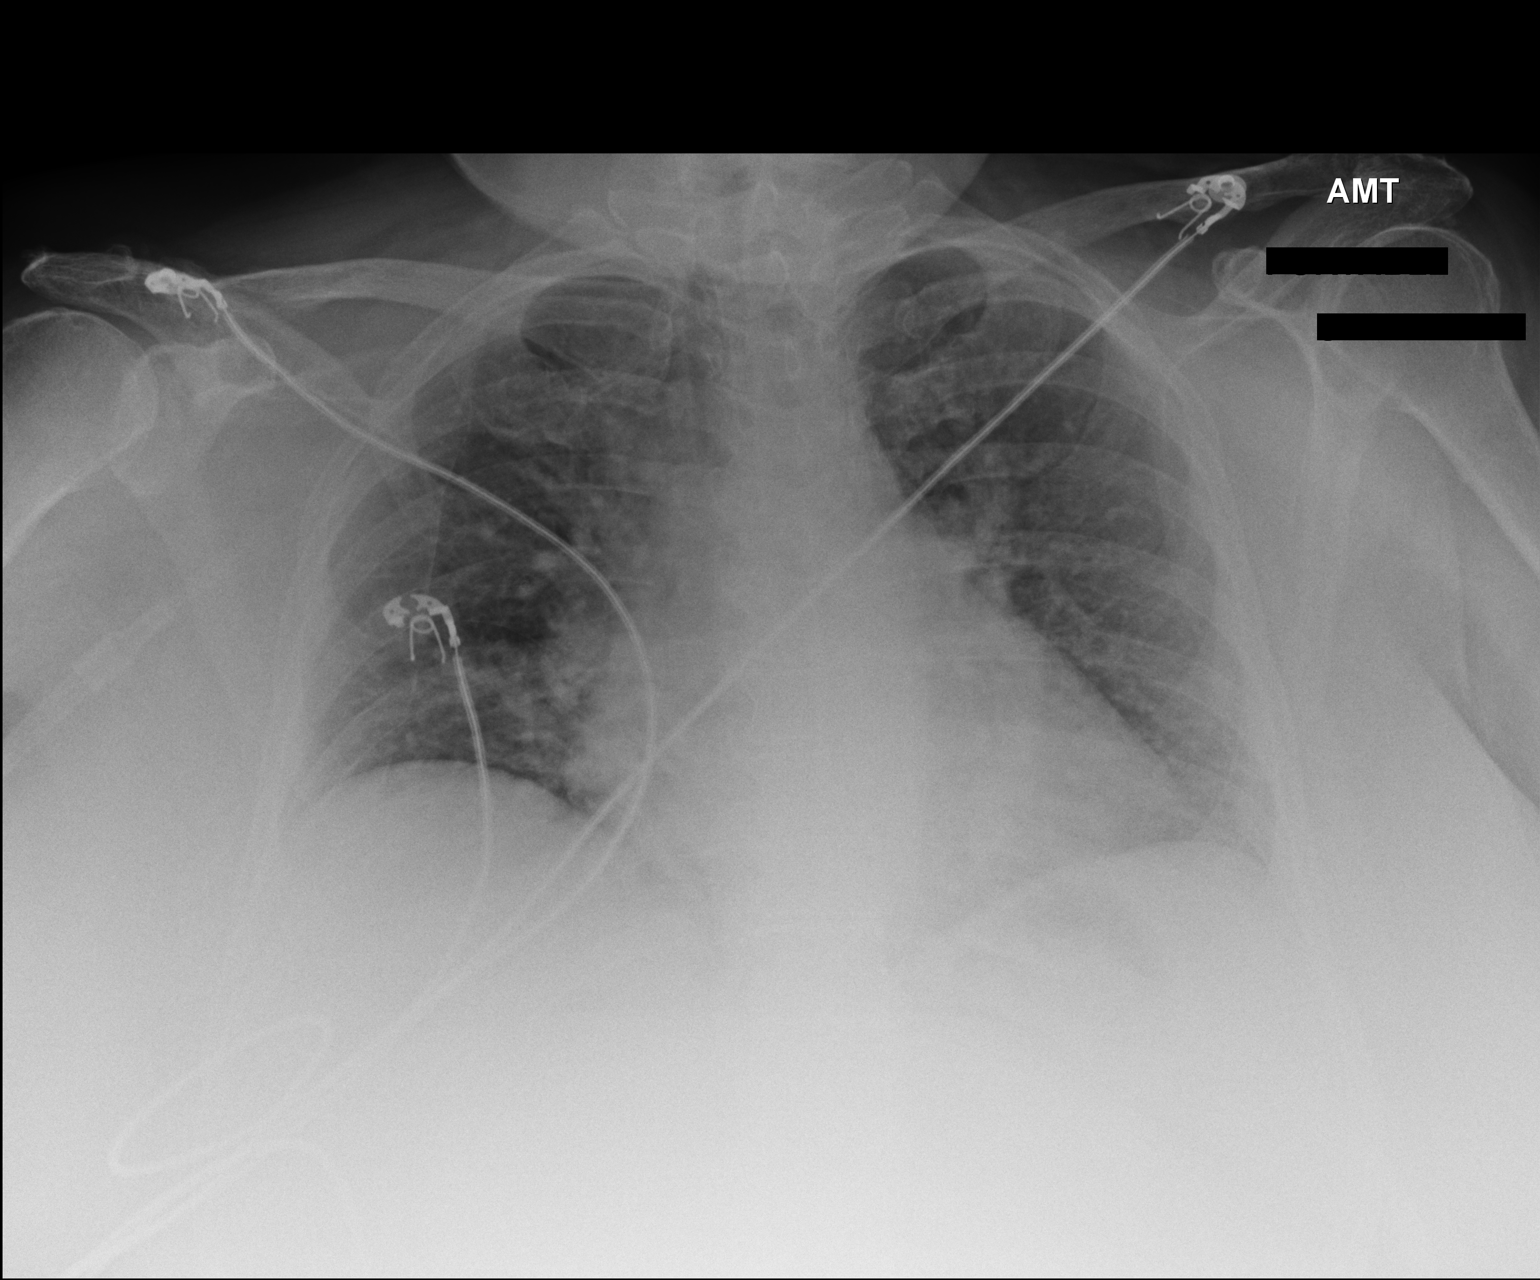

[1 of 1 positions shown; findings below may reference images not displayed]

FINDINGS: The lungs are hypovolemic. There is mild diffuse increased vascular
and interstitial prominence compatible with mild congestive changes.
There is no focal consolidation, pleural effusion, or pneumothorax.
Stable cardiac silhouette. No acute osseous pathology.
IMPRESSION: Findings likely represent mild congestive changes. No focal
consolidation.

## 2016-06-08 DIAGNOSIS — I482 Chronic atrial fibrillation: Secondary | ICD-10-CM | POA: Diagnosis not present

## 2016-06-08 DIAGNOSIS — I5023 Acute on chronic systolic (congestive) heart failure: Secondary | ICD-10-CM | POA: Diagnosis not present

## 2016-06-08 DIAGNOSIS — I2510 Atherosclerotic heart disease of native coronary artery without angina pectoris: Secondary | ICD-10-CM | POA: Diagnosis not present

## 2016-06-08 DIAGNOSIS — E1142 Type 2 diabetes mellitus with diabetic polyneuropathy: Secondary | ICD-10-CM | POA: Diagnosis not present

## 2016-06-28 DIAGNOSIS — F331 Major depressive disorder, recurrent, moderate: Secondary | ICD-10-CM | POA: Diagnosis not present

## 2016-06-28 DIAGNOSIS — F329 Major depressive disorder, single episode, unspecified: Secondary | ICD-10-CM | POA: Diagnosis not present

## 2016-09-06 DIAGNOSIS — M170 Bilateral primary osteoarthritis of knee: Secondary | ICD-10-CM | POA: Diagnosis not present

## 2016-09-19 DIAGNOSIS — I482 Chronic atrial fibrillation: Secondary | ICD-10-CM | POA: Diagnosis not present

## 2016-09-19 DIAGNOSIS — E1142 Type 2 diabetes mellitus with diabetic polyneuropathy: Secondary | ICD-10-CM | POA: Diagnosis not present

## 2016-09-19 DIAGNOSIS — I10 Essential (primary) hypertension: Secondary | ICD-10-CM | POA: Diagnosis not present

## 2016-09-19 DIAGNOSIS — K210 Gastro-esophageal reflux disease with esophagitis: Secondary | ICD-10-CM | POA: Diagnosis not present

## 2016-09-20 DIAGNOSIS — F331 Major depressive disorder, recurrent, moderate: Secondary | ICD-10-CM | POA: Diagnosis not present

## 2016-09-20 DIAGNOSIS — F329 Major depressive disorder, single episode, unspecified: Secondary | ICD-10-CM | POA: Diagnosis not present

## 2017-02-14 DIAGNOSIS — F329 Major depressive disorder, single episode, unspecified: Secondary | ICD-10-CM | POA: Diagnosis not present

## 2017-02-14 DIAGNOSIS — F331 Major depressive disorder, recurrent, moderate: Secondary | ICD-10-CM | POA: Diagnosis not present

## 2017-03-30 DIAGNOSIS — M170 Bilateral primary osteoarthritis of knee: Secondary | ICD-10-CM | POA: Diagnosis not present

## 2017-06-06 DIAGNOSIS — F329 Major depressive disorder, single episode, unspecified: Secondary | ICD-10-CM | POA: Diagnosis not present

## 2017-06-06 DIAGNOSIS — F331 Major depressive disorder, recurrent, moderate: Secondary | ICD-10-CM | POA: Diagnosis not present

## 2017-06-12 DIAGNOSIS — I482 Chronic atrial fibrillation: Secondary | ICD-10-CM | POA: Diagnosis not present

## 2017-06-12 DIAGNOSIS — I2510 Atherosclerotic heart disease of native coronary artery without angina pectoris: Secondary | ICD-10-CM | POA: Diagnosis not present

## 2017-06-12 DIAGNOSIS — I5032 Chronic diastolic (congestive) heart failure: Secondary | ICD-10-CM | POA: Diagnosis not present

## 2017-06-12 DIAGNOSIS — E119 Type 2 diabetes mellitus without complications: Secondary | ICD-10-CM | POA: Diagnosis not present

## 2017-06-19 DIAGNOSIS — D649 Anemia, unspecified: Secondary | ICD-10-CM | POA: Diagnosis not present

## 2017-06-19 DIAGNOSIS — E034 Atrophy of thyroid (acquired): Secondary | ICD-10-CM | POA: Diagnosis not present

## 2017-06-19 DIAGNOSIS — Z79899 Other long term (current) drug therapy: Secondary | ICD-10-CM | POA: Diagnosis not present

## 2017-06-19 DIAGNOSIS — E785 Hyperlipidemia, unspecified: Secondary | ICD-10-CM | POA: Diagnosis not present

## 2017-06-19 DIAGNOSIS — E559 Vitamin D deficiency, unspecified: Secondary | ICD-10-CM | POA: Diagnosis not present

## 2017-06-19 DIAGNOSIS — E119 Type 2 diabetes mellitus without complications: Secondary | ICD-10-CM | POA: Diagnosis not present

## 2017-06-19 DIAGNOSIS — E876 Hypokalemia: Secondary | ICD-10-CM | POA: Diagnosis not present

## 2017-06-29 ENCOUNTER — Encounter (HOSPITAL_COMMUNITY): Payer: Self-pay | Admitting: Oncology

## 2017-06-29 ENCOUNTER — Emergency Department (HOSPITAL_COMMUNITY)
Admission: EM | Admit: 2017-06-29 | Discharge: 2017-06-30 | Disposition: A | Discharge status: Home / Self Care | Payer: Medicare Other | Source: Home / Self Care | Attending: Emergency Medicine | Admitting: Emergency Medicine

## 2017-06-29 DIAGNOSIS — R600 Localized edema: Secondary | ICD-10-CM

## 2017-06-29 DIAGNOSIS — N12 Tubulo-interstitial nephritis, not specified as acute or chronic: Secondary | ICD-10-CM | POA: Diagnosis not present

## 2017-06-29 DIAGNOSIS — E039 Hypothyroidism, unspecified: Secondary | ICD-10-CM

## 2017-06-29 DIAGNOSIS — N136 Pyonephrosis: Secondary | ICD-10-CM | POA: Diagnosis not present

## 2017-06-29 DIAGNOSIS — I2510 Atherosclerotic heart disease of native coronary artery without angina pectoris: Secondary | ICD-10-CM | POA: Insufficient documentation

## 2017-06-29 DIAGNOSIS — C541 Malignant neoplasm of endometrium: Secondary | ICD-10-CM | POA: Insufficient documentation

## 2017-06-29 DIAGNOSIS — Z79899 Other long term (current) drug therapy: Secondary | ICD-10-CM

## 2017-06-29 DIAGNOSIS — I110 Hypertensive heart disease with heart failure: Secondary | ICD-10-CM | POA: Insufficient documentation

## 2017-06-29 DIAGNOSIS — L03116 Cellulitis of left lower limb: Secondary | ICD-10-CM | POA: Diagnosis not present

## 2017-06-29 DIAGNOSIS — Z6841 Body Mass Index (BMI) 40.0 and over, adult: Secondary | ICD-10-CM | POA: Diagnosis not present

## 2017-06-29 DIAGNOSIS — M7989 Other specified soft tissue disorders: Secondary | ICD-10-CM | POA: Diagnosis not present

## 2017-06-29 DIAGNOSIS — E119 Type 2 diabetes mellitus without complications: Secondary | ICD-10-CM

## 2017-06-29 DIAGNOSIS — I130 Hypertensive heart and chronic kidney disease with heart failure and stage 1 through stage 4 chronic kidney disease, or unspecified chronic kidney disease: Secondary | ICD-10-CM | POA: Diagnosis not present

## 2017-06-29 DIAGNOSIS — Z7901 Long term (current) use of anticoagulants: Secondary | ICD-10-CM

## 2017-06-29 DIAGNOSIS — I5042 Chronic combined systolic (congestive) and diastolic (congestive) heart failure: Secondary | ICD-10-CM | POA: Diagnosis not present

## 2017-06-29 DIAGNOSIS — J9612 Chronic respiratory failure with hypercapnia: Secondary | ICD-10-CM | POA: Diagnosis not present

## 2017-06-29 DIAGNOSIS — R1084 Generalized abdominal pain: Secondary | ICD-10-CM | POA: Diagnosis not present

## 2017-06-29 DIAGNOSIS — N1330 Unspecified hydronephrosis: Secondary | ICD-10-CM | POA: Diagnosis not present

## 2017-06-29 DIAGNOSIS — N23 Unspecified renal colic: Secondary | ICD-10-CM | POA: Diagnosis not present

## 2017-06-29 DIAGNOSIS — R109 Unspecified abdominal pain: Secondary | ICD-10-CM | POA: Diagnosis not present

## 2017-06-29 DIAGNOSIS — I509 Heart failure, unspecified: Secondary | ICD-10-CM

## 2017-06-29 LAB — URINALYSIS, ROUTINE W REFLEX MICROSCOPIC
Bilirubin Urine: NEGATIVE
Glucose, UA: NEGATIVE mg/dL
Ketones, ur: NEGATIVE mg/dL
Nitrite: NEGATIVE
Protein, ur: 30 mg/dL — AB
SPECIFIC GRAVITY, URINE: 1.012 (ref 1.005–1.030)
pH: 5 (ref 5.0–8.0)

## 2017-06-29 NOTE — ED Provider Notes (Signed)
[facility] DEPT Provider Note: Georgena Spurling, MD, FACEP  CSN: [id number] MRN: [id number] ARRIVAL: 06/29/17 at 2210 ROOM: [place]  Flank Pain   HISTORY OF PRESENT ILLNESS  [name] is a 61 y.o. female with chronic pain on regularly scheduled buprenorphine 8 milligrams twice a day. She is here with left flank pain radiating to her left lower quadrant that began about midmorning today. The pain worsened throughout the day and it became severe chest before arrival at the hospital. She describes the pain as severe and sharp. She has never had pain this severe. It is described as similar to previous kidney stones. There was associated severe urinary urgency as well as urinary frequency with a sharp pain at her urethral meatus. On arrival the pain resolved and she is having minimal discomfort at the present time. She denies fever or chills. She has had nausea with no vomiting.  She has been off her Lasix because her lower extremity edema had improved but it is now returning. She states it improves after receiving steroids and then slowly worsens as the steroids wear off. She also has a decubitus ulcer of the sacral area which isn't present for about 3 months. She attributes this to being largely bedridden.   Past Medical History:  Diagnosis Date  . Acute and chronic respiratory failure with hypercapnia ([place])   . ADD (attention deficit disorder)   . Altered mental status    2nd to hypercapnea  . CHF (congestive heart failure) ([place])   . Chronic low back pain   . Complication of anesthesia    " I AM SLOW MTO WAKE UP AT ITMES "  . Coronary artery disease   . Depression   . Diabetes mellitus   . Endometrial cancer ([place])   . GERD (gastroesophageal reflux disease)   . Hypercapnia   . Hypertension   . Hypothyroidism   . Hypoxemia   . Morbid obesity ([place])   . Nephrolithiasis   . OSA (obstructive sleep apnea)   . Paroxysmal atrial fibrillation (H[facility])   .  Restless leg syndrome     Past Surgical History:  Procedure Laterality Date  . BACK SURGERY     x2  . DENTAL SURGERY    . SHOULDER SURGERY     x2  . SP PERC NEPHROSTOMY    . TONSILLECTOMY    . VAGINAL HYSTERECTOMY     x2    Family History  Problem Relation Age of Onset  . Cancer Father     Social History  Substance Use Topics  . Smoking status: Former Smoker    Packs/day: 0.50    Years: 2.00    Types: Cigarettes    Quit date: 09/10/1974  . Smokeless tobacco: Never Used     Comment: quit in teenage years  . Alcohol use No    Prior to Admission medications   Medication Sig Start Date End Date Taking? Authorizing Provider  buprenorphine (SUBUTEX) 8 MG SUBL SL tablet Place 8 mg under the tongue 2 (two) times daily.  06/18/17  Yes [provider]  cetirizine (ZYRTEC) 10 MG tablet Take 10 mg by mouth at bedtime.   Yes [provider]  clotrimazole-betamethasone (LOTRISONE) cream Apply 1 application topically 2 (two) times daily.   Yes [provider]  diltiazem (DILACOR XR) 240 MG 24 hr capsule Take 1 capsule (240 mg total) by mouth daily. 11/11/14  Yes Dixie Dials, MD  gabapentin (NEURONTIN) 100 MG capsule  Take 100 mg by mouth at bedtime. 06/18/17  Yes [provider]  Metoprolol Tartrate 75 MG TABS Take 75 mg by mouth 2 (two) times daily. 01/02/16  Yes Hosie Poisson, MD  prochlorperazine (COMPAZINE) 10 MG tablet Take 10 mg by mouth every 6 (six) hours as needed for nausea or vomiting.   Yes [provider]  ranitidine (ZANTAC) 150 MG tablet Take 150 mg by mouth 2 (two) times daily.   Yes [provider]  sertraline (ZOLOFT) 100 MG tablet Take 200 mg by mouth daily.    Yes [provider]  SYNTHROID 75 MCG tablet Take 75 mcg by mouth daily before breakfast.  06/27/17  Yes [provider]  acetaZOLAMIDE (DIAMOX) 250 MG tablet Take 1 tablet (250 mg total) by mouth 2 (two) times daily. Patient not taking: Reported  on 06/29/2017 01/02/16   Hosie Poisson, MD  furosemide (LASIX) 80 MG tablet Take 1 tablet (80 mg total) by mouth daily. Patient taking differently: Take 80 mg by mouth daily as needed for fluid or edema.  01/02/16   Hosie Poisson, MD  levothyroxine (SYNTHROID, LEVOTHROID) 100 MCG tablet Take 1 tablet (100 mcg total) by mouth daily before breakfast. Patient not taking: Reported on 06/29/2017 01/02/16   Hosie Poisson, MD  nystatin (MYCOSTATIN/NYSTOP) 100000 UNIT/GM POWD Apply 1 g topically 2 (two) times daily as needed (for rash).    [provider]  potassium chloride SA (K-DUR,KLOR-CON) 20 MEQ tablet Take 20 mEq by mouth daily as needed (take along with lasix).     [provider]  rivaroxaban (XARELTO) 20 MG TABS tablet Take 1 tablet (20 mg total) by mouth daily with supper. Patient not taking: Reported on 06/29/2017 01/02/16   Hosie Poisson, MD    Allergies Aspirin; Eliquis [apixaban]; Nsaids; Other; Plavix [clopidogrel]; Sudafed [pseudoephedrine]; Erythromycin; Sulfa antibiotics; Sulfamethoxazole-trimethoprim; Altace [ramipril]; and Zofran [ondansetron hcl]   REVIEW OF SYSTEMS  Negative except as noted here or in the History of Present Illness.   PHYSICAL EXAMINATION  Initial Vital Signs There were no vitals taken for this visit.  Examination General: Well-developed, obese female in no acute distress; appearance consistent with age of record HENT: normocephalic; atraumatic Eyes: pupils equal, round and reactive to light; extraocular muscles intact Neck: supple Heart: regular rate and rhythm Lungs: clear to auscultation bilaterally Abdomen: soft; nondistended; nontender; bowel sounds present GU: Chronic-appearing edema of labia majora Extremities: No deformity; full range of motion; 2+ pitting edema of lower extremities Neurologic: Awake, alert and oriented; motor function intact in all extremities and symmetric; no facial droop Skin: Warm and dry Psychiatric: Normal  mood and affect   RESULTS  Summary of this visit's results, reviewed by myself:   EKG Interpretation  Date/Time:    Ventricular Rate:    PR Interval:    QRS Duration:   QT Interval:    QTC Calculation:   R Axis:     Text Interpretation:        Laboratory Studies: Results for orders placed or performed during the hospital encounter of 06/29/17 (from the past 24 hour(s))  Urinalysis, Routine w reflex microscopic- may I&O cath if menses     Status: Abnormal   Collection Time: 06/29/17 11:25 PM  Result Value Ref Range   Color, Urine YELLOW YELLOW   APPearance CLOUDY (A) CLEAR   Specific Gravity, Urine 1.012 1.005 - 1.030   pH 5.0 5.0 - 8.0   Glucose, UA NEGATIVE NEGATIVE mg/dL   Hgb urine dipstick LARGE (A)  NEGATIVE   Bilirubin Urine NEGATIVE NEGATIVE   Ketones, ur NEGATIVE NEGATIVE mg/dL   Protein, ur 30 (A) NEGATIVE mg/dL   Nitrite NEGATIVE NEGATIVE   Leukocytes, UA MODERATE (A) NEGATIVE   RBC / HPF TOO NUMEROUS TO COUNT 0 - 5 RBC/hpf   WBC, UA TOO NUMEROUS TO COUNT 0 - 5 WBC/hpf   Bacteria, UA RARE (A) NONE SEEN   Squamous Epithelial / LPF 0-5 (A) NONE SEEN   Budding Yeast PRESENT    Imaging Studies: Ct Renal Stone Study  Result Date: 06/30/2017 CLINICAL DATA:  Left flank pain.  Nausea. EXAM: CT ABDOMEN AND PELVIS WITHOUT CONTRAST TECHNIQUE: Multidetector CT imaging of the abdomen and pelvis was performed following the standard protocol without IV contrast. COMPARISON:  CT 11/08/2014 FINDINGS: Lower chest: Scattered ground-glass opacities in the lung bases. Linear lingular atelectasis. Coronary artery calcifications. Hepatobiliary: Liver is prominent size without focal lesion. Layering stones or sludge in the gallbladder without abnormal gallbladder distention or pericholecystic inflammation. Pancreas: No ductal dilatation or inflammation. Spleen: Mildly enlarged spanning 13.5 cm. No evidence of focal abnormality. Adrenals/Urinary Tract: No adrenal nodule. Punctate stone in  the left renal pelvis with mild left hydronephrosis and perinephric edema. Additional nonobstructing stones in the left kidney, largest in the lower pole measuring 7 mm. The left ureter is decompressed without stones along the course. Staghorn calculus in the right renal pelvis measures 2.5 x 2.4 cm, slight increase in size from prior exam. Additional nonobstructing stones in the right kidney. Dilatation of the right lower pole calyx versus parapelvic cyst. There is thinning of the right renal parenchyma. The right ureter is decompressed. Urinary bladder is minimally distended, no bladder stone. A 3 mm calcification in the midline perineum is likely a urethral stone. Stomach/Bowel: No bowel obstruction, inflammation or abnormal distention. High-density material in the cecum likely ingested material. The appendix is not discretely identified, no right lower quadrant inflammation to suggest appendicitis. Small to moderate colonic stool burden. Vascular/Lymphatic: Aortic atherosclerosis. Multiple small retroperitoneal lymph nodes are unchanged from prior exam. Small bilateral external iliac nodes. Reproductive: Status post hysterectomy. No adnexal masses. Other: No free air, free fluid, or intra-abdominal fluid collection. Laxity of the anterior abdominal wall musculature with small fat containing umbilical hernia. Musculoskeletal: Multilevel degenerative change in the spine. There are no acute or suspicious osseous abnormalities. IMPRESSION: 1. Mild left hydronephrosis and perinephric edema. There is a probable 3 mm stone in the urethra, likely recently passed stone. The tentatively, the source of hydronephrosis may be a punctate stone in the left renal pelvis that is intermittently obstructing. 2. 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 Additional nonobstructing bilateral renal calculi. 3. Sludge or stones in the gallbladder without pericholecystic inflammation. 4. Aortic atherosclerosis.  Electronically Signed   By: Jeb Levering M.D.   On: 06/30/2017 01:48    ED COURSE  Nursing notes and initial vitals signs, including pulse oximetry, reviewed.  Vitals:   06/29/17 2357  BP: 117/77  Pulse: 79  Resp: 17  Temp: 98.3 F (36.8 C)  TempSrc: Oral  SpO2: 96%   2:43 AM Patient has been pain-free after resolution of the pain that brought her to the ED. She is no longer having pain at her urethral meatus and has urinated multiple times without urethral pain. CT is consistent with a passed stone on the left. Urinalysis is suspicious for pyelonephritis which we will treat with Keflex which she is known to tolerate.  PROCEDURES    ED DIAGNOSES  ICD-10-CM   1. Ureteral colic J28   2. Pyelonephritis N12        Deshana Rominger, MD 06/30/17 [phone]

## 2017-06-29 NOTE — ED Triage Notes (Signed)
Pt bib [facility] from home d/t acute onset of left flank pain, polyuria, dysuria, frequency.  Pt reports the pain is causing nausea.  Pt reported to EMS that she has not been taking her lasix and has b/l weeping in LE's.  Pt has chronic pain and is managed at a pain clinic.

## 2017-06-29 NOTE — ED Notes (Signed)
ED Provider at bedside. 

## 2017-06-30 ENCOUNTER — Emergency Department (HOSPITAL_COMMUNITY): Payer: Medicare Other

## 2017-06-30 DIAGNOSIS — R109 Unspecified abdominal pain: Secondary | ICD-10-CM | POA: Diagnosis not present

## 2017-06-30 DIAGNOSIS — N1330 Unspecified hydronephrosis: Secondary | ICD-10-CM | POA: Diagnosis not present

## 2017-06-30 MED ORDER — CEPHALEXIN 500 MG PO CAPS: 1000.0000 mg | ORAL_CAPSULE | Freq: Once | ORAL | Status: DC

## 2017-06-30 MED ORDER — CEPHALEXIN 500 MG PO CAPS: 500.0000 mg | ORAL_CAPSULE | Freq: Four times a day (QID) | ORAL | 0 refills | Status: DC

## 2017-06-30 NOTE — ED Notes (Signed)
Patient transported to CT 

## 2017-07-02 ENCOUNTER — Inpatient Hospital Stay (HOSPITAL_COMMUNITY)
Admission: EM | Admit: 2017-07-02 | Discharge: 2017-07-11 | DRG: 603 | Disposition: A | Discharge status: Home Health | Payer: Medicare Other | Attending: Internal Medicine | Admitting: Internal Medicine

## 2017-07-02 ENCOUNTER — Encounter (HOSPITAL_COMMUNITY): Payer: Self-pay | Admitting: *Deleted

## 2017-07-02 DIAGNOSIS — L8990 Pressure ulcer of unspecified site, unspecified stage: Secondary | ICD-10-CM | POA: Insufficient documentation

## 2017-07-02 DIAGNOSIS — G4733 Obstructive sleep apnea (adult) (pediatric): Secondary | ICD-10-CM | POA: Diagnosis not present

## 2017-07-02 DIAGNOSIS — L0390 Cellulitis, unspecified: Secondary | ICD-10-CM

## 2017-07-02 DIAGNOSIS — I10 Essential (primary) hypertension: Secondary | ICD-10-CM | POA: Diagnosis not present

## 2017-07-02 DIAGNOSIS — Z886 Allergy status to analgesic agent status: Secondary | ICD-10-CM

## 2017-07-02 DIAGNOSIS — Z8542 Personal history of malignant neoplasm of other parts of uterus: Secondary | ICD-10-CM

## 2017-07-02 DIAGNOSIS — Z87891 Personal history of nicotine dependence: Secondary | ICD-10-CM

## 2017-07-02 DIAGNOSIS — L03116 Cellulitis of left lower limb: Principal | ICD-10-CM | POA: Diagnosis present

## 2017-07-02 DIAGNOSIS — J9612 Chronic respiratory failure with hypercapnia: Secondary | ICD-10-CM

## 2017-07-02 DIAGNOSIS — N136 Pyonephrosis: Secondary | ICD-10-CM | POA: Diagnosis present

## 2017-07-02 DIAGNOSIS — Z7901 Long term (current) use of anticoagulants: Secondary | ICD-10-CM

## 2017-07-02 DIAGNOSIS — E039 Hypothyroidism, unspecified: Secondary | ICD-10-CM | POA: Diagnosis present

## 2017-07-02 DIAGNOSIS — I5042 Chronic combined systolic (congestive) and diastolic (congestive) heart failure: Secondary | ICD-10-CM | POA: Diagnosis present

## 2017-07-02 DIAGNOSIS — E1122 Type 2 diabetes mellitus with diabetic chronic kidney disease: Secondary | ICD-10-CM | POA: Diagnosis present

## 2017-07-02 DIAGNOSIS — F988 Other specified behavioral and emotional disorders with onset usually occurring in childhood and adolescence: Secondary | ICD-10-CM | POA: Diagnosis present

## 2017-07-02 DIAGNOSIS — I2510 Atherosclerotic heart disease of native coronary artery without angina pectoris: Secondary | ICD-10-CM | POA: Diagnosis present

## 2017-07-02 DIAGNOSIS — Z23 Encounter for immunization: Secondary | ICD-10-CM

## 2017-07-02 DIAGNOSIS — G2581 Restless legs syndrome: Secondary | ICD-10-CM | POA: Diagnosis present

## 2017-07-02 DIAGNOSIS — E1142 Type 2 diabetes mellitus with diabetic polyneuropathy: Secondary | ICD-10-CM | POA: Diagnosis present

## 2017-07-02 DIAGNOSIS — E119 Type 2 diabetes mellitus without complications: Secondary | ICD-10-CM

## 2017-07-02 DIAGNOSIS — I480 Paroxysmal atrial fibrillation: Secondary | ICD-10-CM | POA: Diagnosis present

## 2017-07-02 DIAGNOSIS — Z79899 Other long term (current) drug therapy: Secondary | ICD-10-CM

## 2017-07-02 DIAGNOSIS — Z888 Allergy status to other drugs, medicaments and biological substances status: Secondary | ICD-10-CM

## 2017-07-02 DIAGNOSIS — I130 Hypertensive heart and chronic kidney disease with heart failure and stage 1 through stage 4 chronic kidney disease, or unspecified chronic kidney disease: Secondary | ICD-10-CM | POA: Diagnosis present

## 2017-07-02 DIAGNOSIS — Z882 Allergy status to sulfonamides status: Secondary | ICD-10-CM

## 2017-07-02 DIAGNOSIS — K219 Gastro-esophageal reflux disease without esophagitis: Secondary | ICD-10-CM | POA: Diagnosis present

## 2017-07-02 DIAGNOSIS — Z6841 Body Mass Index (BMI) 40.0 and over, adult: Secondary | ICD-10-CM

## 2017-07-02 DIAGNOSIS — N183 Chronic kidney disease, stage 3 (moderate): Secondary | ICD-10-CM | POA: Diagnosis present

## 2017-07-02 DIAGNOSIS — Z880 Allergy status to penicillin: Secondary | ICD-10-CM

## 2017-07-02 DIAGNOSIS — B351 Tinea unguium: Secondary | ICD-10-CM | POA: Diagnosis present

## 2017-07-02 DIAGNOSIS — M79606 Pain in leg, unspecified: Secondary | ICD-10-CM | POA: Diagnosis not present

## 2017-07-02 LAB — CBC WITH DIFFERENTIAL/PLATELET
BASOS PCT: 0 %
Basophils Absolute: 0 10*3/uL (ref 0.0–0.1)
EOS PCT: 0 %
Eosinophils Absolute: 0 10*3/uL (ref 0.0–0.7)
HEMATOCRIT: 36.1 % (ref 36.0–46.0)
Hemoglobin: 10.6 g/dL — ABNORMAL LOW (ref 12.0–15.0)
LYMPHS ABS: 1 10*3/uL (ref 0.7–4.0)
Lymphocytes Relative: 4 %
MCH: 24.3 pg — AB (ref 26.0–34.0)
MCHC: 29.4 g/dL — AB (ref 30.0–36.0)
MCV: 82.8 fL (ref 78.0–100.0)
MONO ABS: 1.3 10*3/uL — AB (ref 0.1–1.0)
MONOS PCT: 5 %
NEUTROS ABS: 23.2 10*3/uL — AB (ref 1.7–7.7)
Neutrophils Relative %: 91 %
PLATELETS: 232 10*3/uL (ref 150–400)
RBC: 4.36 MIL/uL (ref 3.87–5.11)
RDW: 16.9 % — AB (ref 11.5–15.5)
WBC: 25.5 10*3/uL — ABNORMAL HIGH (ref 4.0–10.5)

## 2017-07-02 LAB — BASIC METABOLIC PANEL
Anion gap: 7 (ref 5–15)
BUN: 16 mg/dL (ref 6–20)
CALCIUM: 8.4 mg/dL — AB (ref 8.9–10.3)
CO2: 34 mmol/L — AB (ref 22–32)
Chloride: 96 mmol/L — ABNORMAL LOW (ref 101–111)
Creatinine, Ser: 0.97 mg/dL (ref 0.44–1.00)
GFR calc Af Amer: >60 mL/min (ref >60)
GLUCOSE: 154 mg/dL — AB (ref 65–99)
Potassium: 4 mmol/L (ref 3.5–5.1)
Sodium: 137 mmol/L (ref 135–145)

## 2017-07-02 LAB — GLUCOSE, CAPILLARY
Glucose-Capillary: 127 mg/dL — ABNORMAL HIGH (ref 65–99)
Glucose-Capillary: 166 mg/dL — ABNORMAL HIGH (ref 65–99)

## 2017-07-02 LAB — URINE CULTURE

## 2017-07-02 LAB — TSH: TSH: 3.802 u[IU]/mL (ref 0.350–4.500)

## 2017-07-02 MED ORDER — CLINDAMYCIN PHOSPHATE 600 MG/50ML IV SOLN: 600.0000 mg | Freq: Once | INTRAVENOUS | Status: DC

## 2017-07-02 MED ORDER — RIVAROXABAN 20 MG PO TABS
20.0000 mg | ORAL_TABLET | Freq: Every day | ORAL | Status: DC
  Filled 2017-07-02: qty 1

## 2017-07-02 MED ORDER — CLINDAMYCIN PHOSPHATE 600 MG/50ML IV SOLN
600.0000 mg | Freq: Three times a day (TID) | INTRAVENOUS | Status: DC
  Administered 2017-07-02 – 2017-07-06 (×11): 600 mg via INTRAVENOUS
  Filled 2017-07-02 (×13): qty 50

## 2017-07-02 MED ORDER — SERTRALINE HCL 100 MG PO TABS
200.0000 mg | ORAL_TABLET | Freq: Every day | ORAL | Status: DC
  Administered 2017-07-03 – 2017-07-11 (×9): 200 mg via ORAL
  Filled 2017-07-02 (×9): qty 2

## 2017-07-02 MED ORDER — INSULIN ASPART 100 UNIT/ML ~~LOC~~ SOLN
6.0000 [IU] | Freq: Three times a day (TID) | SUBCUTANEOUS | Status: DC
  Administered 2017-07-03: 6 [IU] via SUBCUTANEOUS

## 2017-07-02 MED ORDER — ACETAMINOPHEN 650 MG RE SUPP: 650.0000 mg | Freq: Four times a day (QID) | RECTAL | Status: DC | PRN

## 2017-07-02 MED ORDER — INSULIN ASPART 100 UNIT/ML ~~LOC~~ SOLN
0.0000 [IU] | Freq: Three times a day (TID) | SUBCUTANEOUS | Status: DC
Start: 2017-07-02 — End: 2017-07-11
  Administered 2017-07-02 – 2017-07-04 (×4): 3 [IU] via SUBCUTANEOUS
  Administered 2017-07-04: 4 [IU] via SUBCUTANEOUS
  Administered 2017-07-05: 3 [IU] via SUBCUTANEOUS
  Administered 2017-07-05: 4 [IU] via SUBCUTANEOUS
  Administered 2017-07-06 – 2017-07-08 (×5): 3 [IU] via SUBCUTANEOUS
  Administered 2017-07-08 – 2017-07-10 (×4): 4 [IU] via SUBCUTANEOUS
  Administered 2017-07-11: 3 [IU] via SUBCUTANEOUS
  Administered 2017-07-11: 4 [IU] via SUBCUTANEOUS
  Administered 2017-07-11: 3 [IU] via SUBCUTANEOUS

## 2017-07-02 MED ORDER — SODIUM CHLORIDE 0.9 % IV SOLN
250.0000 mL | INTRAVENOUS | Status: DC | PRN
  Administered 2017-07-03: 250 mL via INTRAVENOUS

## 2017-07-02 MED ORDER — SERTRALINE HCL 50 MG PO TABS: 200.0000 mg | ORAL_TABLET | Freq: Two times a day (BID) | ORAL | Status: DC

## 2017-07-02 MED ORDER — LORATADINE 10 MG PO TABS
10.0000 mg | ORAL_TABLET | Freq: Every day | ORAL | Status: DC
  Administered 2017-07-02 – 2017-07-11 (×10): 10 mg via ORAL
  Filled 2017-07-02 (×10): qty 1

## 2017-07-02 MED ORDER — BUPRENORPHINE HCL-NALOXONE HCL 8-2 MG SL SUBL
1.0000 | SUBLINGUAL_TABLET | Freq: Every day | SUBLINGUAL | Status: DC
  Filled 2017-07-02: qty 1

## 2017-07-02 MED ORDER — CLINDAMYCIN PHOSPHATE 600 MG/50ML IV SOLN
600.0000 mg | Freq: Once | INTRAVENOUS | Status: AC
  Administered 2017-07-02: 600 mg via INTRAVENOUS
  Filled 2017-07-02: qty 50

## 2017-07-02 MED ORDER — SENNOSIDES-DOCUSATE SODIUM 8.6-50 MG PO TABS: 1.0000 | ORAL_TABLET | Freq: Every evening | ORAL | Status: DC | PRN

## 2017-07-02 MED ORDER — METOPROLOL TARTRATE 25 MG PO TABS
75.0000 mg | ORAL_TABLET | Freq: Two times a day (BID) | ORAL | Status: DC
  Administered 2017-07-02 – 2017-07-11 (×18): 75 mg via ORAL
  Filled 2017-07-02 (×19): qty 3

## 2017-07-02 MED ORDER — FUROSEMIDE 40 MG PO TABS
40.0000 mg | ORAL_TABLET | Freq: Every day | ORAL | Status: DC
  Administered 2017-07-03 – 2017-07-04 (×2): 40 mg via ORAL
  Filled 2017-07-02 (×2): qty 1

## 2017-07-02 MED ORDER — PROCHLORPERAZINE MALEATE 10 MG PO TABS
10.0000 mg | ORAL_TABLET | Freq: Four times a day (QID) | ORAL | Status: DC | PRN
  Administered 2017-07-08 – 2017-07-09 (×3): 10 mg via ORAL
  Filled 2017-07-02 (×3): qty 1

## 2017-07-02 MED ORDER — FAMOTIDINE 20 MG PO TABS
20.0000 mg | ORAL_TABLET | Freq: Every day | ORAL | Status: DC
  Administered 2017-07-02 – 2017-07-11 (×10): 20 mg via ORAL
  Filled 2017-07-02 (×10): qty 1

## 2017-07-02 MED ORDER — INSULIN ASPART 100 UNIT/ML ~~LOC~~ SOLN
0.0000 [IU] | Freq: Every day | SUBCUTANEOUS | Status: DC
  Administered 2017-07-04: 3 [IU] via SUBCUTANEOUS

## 2017-07-02 MED ORDER — SODIUM CHLORIDE 0.9% FLUSH: 3.0000 mL | INTRAVENOUS | Status: DC | PRN

## 2017-07-02 MED ORDER — GABAPENTIN 100 MG PO CAPS
100.0000 mg | ORAL_CAPSULE | Freq: Every day | ORAL | Status: DC
  Administered 2017-07-02 – 2017-07-04 (×3): 100 mg via ORAL
  Filled 2017-07-02 (×3): qty 1

## 2017-07-02 MED ORDER — DILTIAZEM HCL ER COATED BEADS 240 MG PO CP24
240.0000 mg | ORAL_CAPSULE | Freq: Every day | ORAL | Status: DC
  Administered 2017-07-03 – 2017-07-11 (×9): 240 mg via ORAL
  Filled 2017-07-02 (×12): qty 1

## 2017-07-02 MED ORDER — TETANUS-DIPHTH-ACELL PERTUSSIS 5-2.5-18.5 LF-MCG/0.5 IM SUSP
0.5000 mL | Freq: Once | INTRAMUSCULAR | Status: AC
  Administered 2017-07-02: 0.5 mL via INTRAMUSCULAR
  Filled 2017-07-02: qty 0.5

## 2017-07-02 MED ORDER — LEVOTHYROXINE SODIUM 50 MCG PO TABS
75.0000 ug | ORAL_TABLET | Freq: Every day | ORAL | Status: DC
  Administered 2017-07-03 – 2017-07-11 (×9): 75 ug via ORAL
  Filled 2017-07-02 (×9): qty 1

## 2017-07-02 MED ORDER — BUPRENORPHINE HCL 8 MG SL SUBL
8.0000 mg | SUBLINGUAL_TABLET | Freq: Two times a day (BID) | SUBLINGUAL | Status: DC
  Administered 2017-07-02 – 2017-07-11 (×18): 8 mg via SUBLINGUAL
  Filled 2017-07-02 (×19): qty 1

## 2017-07-02 MED ORDER — SODIUM CHLORIDE 0.9% FLUSH
3.0000 mL | Freq: Two times a day (BID) | INTRAVENOUS | Status: DC
  Administered 2017-07-02 – 2017-07-10 (×5): 3 mL via INTRAVENOUS

## 2017-07-02 MED ORDER — ACETAMINOPHEN 325 MG PO TABS
650.0000 mg | ORAL_TABLET | Freq: Four times a day (QID) | ORAL | Status: DC | PRN
  Administered 2017-07-03 – 2017-07-08 (×7): 650 mg via ORAL
  Filled 2017-07-02 (×7): qty 2

## 2017-07-02 NOTE — H&P (Signed)
History and Physical    [name] TDD:[id number] DOB: 03-09-1956 DOA: 07/02/2017  Referring MD/NP/PA: Simonne Martinet, ED PA PCP: Dixie Dials, MD  Patient coming from: Home  Chief Complaint: Left lower leg pain and swelling  HPI: [name] is a 61 y.o. female with multiple medical cor morbidities including chronic hypercapnic respiratory failure, morbid obesity, diabetes, hypertension, GERD among other issues who presents to the hospital today with the above complaints. She was seen in the emergency department on 7/20 for UTI and discharged home on Keflex. The day after this visit she started noticing pain and redness to her left leg that has become progressively worse over the past 3 days which prompts her to come back to the hospital today for evaluation. She has had subjective fevers and chills. Afebrile in the ED, labs are significant for a white blood cell count of 25.5 and admission is requested.  Past Medical/Surgical History: Past Medical History:  Diagnosis Date  . Acute and chronic respiratory failure with hypercapnia ([place])   . ADD (attention deficit disorder)   . Altered mental status    2nd to hypercapnea  . CHF (congestive heart failure) ([place])   . Chronic low back pain   . Complication of anesthesia    " I AM SLOW MTO WAKE UP AT ITMES "  . Coronary artery disease   . Depression   . Diabetes mellitus   . Endometrial cancer ([place])   . GERD (gastroesophageal reflux disease)   . Hypercapnia   . Hypertension   . Hypothyroidism   . Hypoxemia   . Morbid obesity ([place])   . Nephrolithiasis   . OSA (obstructive sleep apnea)   . Paroxysmal atrial fibrillation ([facility])   . Restless leg syndrome     Past Surgical History:  Procedure Laterality Date  . BACK SURGERY     x2  . DENTAL SURGERY    . SHOULDER SURGERY     x2  . SP PERC NEPHROSTOMY    . TONSILLECTOMY    . VAGINAL HYSTERECTOMY     x2    Social History:  reports that she quit smoking about 42 years  ago. Her smoking use included Cigarettes. She has a 1.00 pack-year smoking history. She has never used smokeless tobacco. She reports that she does not drink alcohol or use drugs.  Allergies: Allergies  Allergen Reactions  . Aspirin Other (See Comments)    Chest pain  . Eliquis [Apixaban]     Excessive bleeding  . Nsaids Other (See Comments)    Chest pain  . Other Other (See Comments)    "BP med" combined with plavix, caused syncope  . Plavix [Clopidogrel] Other (See Comments)    syncope  . Sudafed [Pseudoephedrine] Palpitations    Raised BP  . Erythromycin Nausea And Vomiting  . Sulfa Antibiotics Other (See Comments)    constipation  . Sulfamethoxazole-Trimethoprim Diarrhea  . Altace [Ramipril] Other (See Comments)    Severe fatigue  . Zofran [Ondansetron Hcl] Other (See Comments)    "I put me into a coma"    Family History:  Family History  Problem Relation Age of Onset  . Cancer Father     Prior to Admission medications   Medication Sig Start Date End Date Taking? Authorizing Provider  buprenorphine (SUBUTEX) 8 MG SUBL SL tablet Place 8 mg under the tongue 2 (two) times daily.  06/18/17  Yes [provider]  cephALEXin (KEFLEX) 500 MG capsule Take 1 capsule (500 mg  total) by mouth 4 (four) times daily. 06/30/17  Yes Molpus, John, MD  cetirizine (ZYRTEC) 10 MG tablet Take 10 mg by mouth at bedtime.   Yes [provider]  clotrimazole-betamethasone (LOTRISONE) cream Apply 1 application topically daily as needed (antifungal).    Yes [provider]  furosemide (LASIX) 40 MG tablet Take 40 mg by mouth daily.   Yes [provider]  gabapentin (NEURONTIN) 100 MG capsule Take 100 mg by mouth at bedtime. 06/18/17  Yes [provider]  Metoprolol Tartrate 75 MG TABS Take 75 mg by mouth 2 (two) times daily. 01/02/16  Yes Hosie Poisson, MD  prochlorperazine (COMPAZINE) 10 MG tablet Take 10 mg by mouth every 6 (six) hours as needed for nausea or  vomiting.   Yes [provider]  ranitidine (ZANTAC) 150 MG tablet Take 150 mg by mouth 2 (two) times daily.   Yes [provider]  rivaroxaban (XARELTO) 20 MG TABS tablet Take 20 mg by mouth daily with supper.   Yes [provider]  sertraline (ZOLOFT) 100 MG tablet Take 200 mg by mouth 2 (two) times daily.    Yes [provider]  SYNTHROID 75 MCG tablet Take 75 mcg by mouth daily before breakfast.  06/27/17  Yes [provider]  diltiazem (DILACOR XR) 240 MG 24 hr capsule Take 1 capsule (240 mg total) by mouth daily. 11/11/14   Dixie Dials, MD  furosemide (LASIX) 80 MG tablet Take 1 tablet (80 mg total) by mouth daily. Patient not taking: Reported on 07/02/2017 01/02/16   Hosie Poisson, MD    Review of Systems:  Constitutional: Denies fever, chills, diaphoresis, appetite change and fatigue.  HEENT: Denies photophobia, eye pain, redness, hearing loss, ear pain, congestion, sore throat, rhinorrhea, sneezing, mouth sores, trouble swallowing, neck pain, neck stiffness and tinnitus.   Respiratory: Denies SOB, DOE, cough, chest tightness,  and wheezing.   Cardiovascular: Denies chest pain, palpitations and leg swelling.  Gastrointestinal: Denies nausea, vomiting, abdominal pain, diarrhea, constipation, blood in stool and abdominal distention.  Genitourinary: Denies dysuria, urgency, frequency, hematuria, flank pain and difficulty urinating.  Endocrine: Denies: hot or cold intolerance, sweats, changes in hair or nails, polyuria, polydipsia. Musculoskeletal: Denies myalgias, back pain, joint swelling, arthralgias and gait problem.  Skin: Denies pallor, rash and wound.  Neurological: Denies dizziness, seizures, syncope, weakness, light-headedness, numbness and headaches.  Hematological: Denies adenopathy. Easy bruising, personal or family bleeding history  Psychiatric/Behavioral: Denies suicidal ideation, mood changes, confusion, nervousness, sleep  disturbance and agitation    Physical Exam: Vitals:   07/02/17 1058 07/02/17 1358  BP: (!) 141/81 (!) 148/78  Pulse: (!) 125 (!) 103  Resp: 19 18  Temp: 98.6 F (37 C) 98.5 F (36.9 C)  TempSrc: Oral Oral  SpO2: 98% 100%  Weight: 136.1 kg (300 lb)   Height: 5\' 5"  (1.651 m)      Constitutional: NAD, calm, comfortable,Morbidly obese, strong odor of urine Eyes: PERRL, lids and conjunctivae normal ENMT: Mucous membranes are moist. Posterior pharynx clear of any exudate or lesions.Normal dentition.  Neck: normal, supple, no masses, no thyromegaly Respiratory: clear to auscultation bilaterally, no wheezing, no crackles. Normal respiratory effort. No accessory muscle use.  Cardiovascular: Regular rate and rhythm, no murmurs / rubs / gallops. No extremity edema. 2+ pedal pulses. No carotid bruits.  Abdomen: no tenderness, no masses palpated. No hepatosplenomegaly. Bowel sounds positive.  Musculoskeletal: Left lower extremity with redness and tight nonpitting edema from her ankles up to her knee.  Skin: no rashes, lesions, ulcers. No induration Neurologic: CN 2-12 grossly intact. Sensation intact, DTR normal. Strength 5/5 in all 4.  Psychiatric: Normal judgment and insight. Alert and oriented x 3. Normal mood.    Labs on Admission: I have personally reviewed the following labs and imaging studies  CBC:  Recent Labs Lab 07/02/17 1340  WBC 25.5*  NEUTROABS 23.2*  HGB 10.6*  HCT 36.1  MCV 82.8  PLT 127   Basic Metabolic Panel:  Recent Labs Lab 07/02/17 1340  NA 137  K 4.0  CL 96*  CO2 34*  GLUCOSE 154*  BUN 16  CREATININE 0.97  CALCIUM 8.4*   GFR: Estimated Creatinine Clearance: 83 mL/min (by C-G formula based on SCr of 0.97 mg/dL). Liver Function Tests: No results for input(s): AST, ALT, ALKPHOS, BILITOT, PROT, ALBUMIN in the last 168 hours. No results for input(s): LIPASE, AMYLASE in the last 168 hours. No results for input(s): AMMONIA in the last 168  hours. Coagulation Profile: No results for input(s): INR, PROTIME in the last 168 hours. Cardiac Enzymes: No results for input(s): CKTOTAL, CKMB, CKMBINDEX, TROPONINI in the last 168 hours. BNP (last 3 results) No results for input(s): PROBNP in the last 8760 hours. HbA1C: No results for input(s): HGBA1C in the last 72 hours. CBG: No results for input(s): GLUCAP in the last 168 hours. Lipid Profile: No results for input(s): CHOL, HDL, LDLCALC, TRIG, CHOLHDL, LDLDIRECT in the last 72 hours. Thyroid Function Tests: No results for input(s): TSH, T4TOTAL, FREET4, T3FREE, THYROIDAB in the last 72 hours. Anemia Panel: No results for input(s): VITAMINB12, FOLATE, FERRITIN, TIBC, IRON, RETICCTPCT in the last 72 hours. Urine analysis:    Component Value Date/Time   COLORURINE YELLOW 06/29/2017 2325   APPEARANCEUR CLOUDY (A) 06/29/2017 2325   LABSPEC 1.012 06/29/2017 2325   PHURINE 5.0 06/29/2017 2325   GLUCOSEU NEGATIVE 06/29/2017 2325   HGBUR LARGE (A) 06/29/2017 2325   BILIRUBINUR NEGATIVE 06/29/2017 2325   KETONESUR NEGATIVE 06/29/2017 2325   PROTEINUR 30 (A) 06/29/2017 2325   UROBILINOGEN 0.2 11/08/2014 1942   NITRITE NEGATIVE 06/29/2017 2325   LEUKOCYTESUR MODERATE (A) 06/29/2017 2325   Sepsis Labs: @LABRCNTIP (procalcitonin:4,lacticidven:4) ) Recent Results (from the past 240 hour(s))  Urine culture     Status: Abnormal   Collection Time: 06/29/17 11:25 PM  Result Value Ref Range Status   Specimen Description URINE, RANDOM  Final   Special Requests NONE  Final   Culture (A)  Final    30,000 COLONIES/mL ESCHERICHIA COLI 20,000 COLONIES/mL STREPTOCOCCUS GROUP C    Report Status 07/02/2017 FINAL  Final   Organism ID, Bacteria ESCHERICHIA COLI (A)  Final      Susceptibility   Escherichia coli - MIC*    AMPICILLIN >=32 RESISTANT Resistant     CEFAZOLIN <=4 SENSITIVE Sensitive     CEFTRIAXONE <=1 SENSITIVE Sensitive     CIPROFLOXACIN 1 SENSITIVE Sensitive     GENTAMICIN  <=1 SENSITIVE Sensitive     IMIPENEM <=0.25 SENSITIVE Sensitive     NITROFURANTOIN <=16 SENSITIVE Sensitive     TRIMETH/SULFA <=20 SENSITIVE Sensitive     AMPICILLIN/SULBACTAM 16 INTERMEDIATE Intermediate     PIP/TAZO <=4 SENSITIVE Sensitive     Extended ESBL NEGATIVE Sensitive     * 30,000 COLONIES/mL ESCHERICHIA COLI     Radiological Exams on Admission: No results found.  EKG: Independently reviewed. None obtained in the emergency department  Assessment/Plan Principal Problem:   Cellulitis of leg, left Active Problems:   OSA (obstructive  sleep apnea)   Hypercapnic respiratory failure, chronic (HCC)   Diabetes mellitus (H[facility])   HTN (hypertension)   Obesity, Class III, BMI 40-49.9 (morbid obesity) (HCC)    Left leg cellulitis -Given very high white blood cell count and failure to improve on outpatient Keflex, decision has been made to admit her to the hospital for IV antibiotics. -Will start her on clindamycin given her disease is relatively mild but failed to improved on an outpatient cephalosporin. Need to consider possibility of community-acquired MRSA.  Diabetes -Check A1c, on sliding scale insulin.  Paroxysmal A. Fib -Fully anticoagulated on Xarelto.  Chronic hypercapnic respiratory failure -Due mainly to morbid obesity/OSA. -Continue oxygen, nightly C Pap.  Benign essential hypertension -Well-controlled, continue home medications.  Hypothyroidism -Check TSH, continue home dose of Synthroid.   DVT prophylaxis: Xarelto  Code Status: Full code  Family Communication: Patient only  Disposition Plan: Likely discharge home in 48 hours  Consults called: None  Admission status: Observation    Time Spent: 75 minutes  Lelon Frohlich MD [facility] Pager [phone]  If 7PM-7AM, please contact night-coverage [email or web address] Password [email or web address]  07/02/2017, 4:12 PM

## 2017-07-02 NOTE — ED Provider Notes (Signed)
[facility] DEPT Provider Note   CSN: [id number] Arrival date & time: 07/02/17  1040  By signing my name below, I, Margit Banda, attest that this documentation has been prepared under the direction and in the presence of Providence Lanius, PA-C. Electronically Signed: Margit Banda, ED Scribe. 07/02/17. 11:39 AM.  History   Chief Complaint Chief Complaint  Patient presents with  . possible infection    HPI [name] is a 61 y.o. female with a PMHx of DM, CAD, CHF, who presents to the Emergency Department complaining of a gradually worsening, wound to her left leg s/p stabbing her leg with a letter opener week. Patient reports that she was attempting to open a letter when the knife slipped in her hands and punctured the left mid calf region. Initially she was observing the wound with patient's. Patient reports that over the last 3 days, her symptoms have significant worsened. She reports that pain, redness, swelling to the leg has increased the last 3 days. Patient was seen in the ED on 06/29/17 for an unrelated issue. She was discharged with Keflex for treatment of the UTI. Patient states that she has been taking the Keflex. Tetanus is NOT UTD. Pt denies fever, CP, SOB, or any other sx at this time.   The history is provided by the patient. No language interpreter was used.    Past Medical History:  Diagnosis Date  . Acute and chronic respiratory failure with hypercapnia ([place])   . ADD (attention deficit disorder)   . Altered mental status    2nd to hypercapnea  . CHF (congestive heart failure) ([place])   . Chronic low back pain   . Complication of anesthesia    " I AM SLOW MTO WAKE UP AT ITMES "  . Coronary artery disease   . Depression   . Diabetes mellitus   . Endometrial cancer ([place])   . GERD (gastroesophageal reflux disease)   . Hypercapnia   . Hypertension   . Hypothyroidism   . Hypoxemia   . Morbid obesity ([place])   . Nephrolithiasis   . OSA (obstructive sleep  apnea)   . Paroxysmal atrial fibrillation (H[facility])   . Restless leg syndrome     Patient Active Problem List   Diagnosis Date Noted  . Cellulitis of leg, left 07/02/2017  . Obesity, Class III, BMI 40-49.9 (morbid obesity) ([place]) 07/02/2017  . Acute on chronic left systolic heart failure ([place]) 02/10/2016  . Acute respiratory failure with hypoxia and hypercapnia (H[facility])   . Seizures ([place])   . Aspiration pneumonia ([place])   . Sepsis ([place])   . Acute on chronic respiratory failure ([place]) 12/25/2015  . Pneumonia 11/09/2014  . CAP (community acquired pneumonia) 10/08/2014  . Paroxysmal atrial fibrillation ([place]) 04/11/2014  . Narcotic overdose 07/07/2013  . Hypoxia 07/07/2013  . Diabetes mellitus ([place]) 07/07/2013  . HTN (hypertension) 07/07/2013  . Chronic pain 07/07/2013  . Hypercapnic respiratory failure, chronic ([place]) 10/17/2011  . OSA (obstructive sleep apnea) 12/23/2007    Past Surgical History:  Procedure Laterality Date  . BACK SURGERY     x2  . DENTAL SURGERY    . SHOULDER SURGERY     x2  . SP PERC NEPHROSTOMY    . TONSILLECTOMY    . VAGINAL HYSTERECTOMY     x2    OB History    No data available       Home Medications    Prior to Admission medications   Medication Sig Start Date  End Date Taking? Authorizing Provider  buprenorphine (SUBUTEX) 8 MG SUBL SL tablet Place 8 mg under the tongue 2 (two) times daily.  06/18/17  Yes [provider]  cephALEXin (KEFLEX) 500 MG capsule Take 1 capsule (500 mg total) by mouth 4 (four) times daily. 06/30/17  Yes Molpus, John, MD  cetirizine (ZYRTEC) 10 MG tablet Take 10 mg by mouth at bedtime.   Yes [provider]  clotrimazole-betamethasone (LOTRISONE) cream Apply 1 application topically daily as needed (antifungal).    Yes [provider]  furosemide (LASIX) 40 MG tablet Take 40 mg by mouth daily.   Yes [provider]  gabapentin (NEURONTIN) 100 MG capsule Take 100 mg by mouth at bedtime. 06/18/17   Yes [provider]  Metoprolol Tartrate 75 MG TABS Take 75 mg by mouth 2 (two) times daily. 01/02/16  Yes Hosie Poisson, MD  prochlorperazine (COMPAZINE) 10 MG tablet Take 10 mg by mouth every 6 (six) hours as needed for nausea or vomiting.   Yes [provider]  ranitidine (ZANTAC) 150 MG tablet Take 150 mg by mouth 2 (two) times daily.   Yes [provider]  rivaroxaban (XARELTO) 20 MG TABS tablet Take 20 mg by mouth daily with supper.   Yes [provider]  sertraline (ZOLOFT) 100 MG tablet Take 200 mg by mouth every morning.    Yes [provider]  SYNTHROID 75 MCG tablet Take 75 mcg by mouth daily before breakfast.  06/27/17  Yes [provider]  diltiazem (DILACOR XR) 240 MG 24 hr capsule Take 1 capsule (240 mg total) by mouth daily. 11/11/14   Dixie Dials, MD  furosemide (LASIX) 80 MG tablet Take 1 tablet (80 mg total) by mouth daily. Patient not taking: Reported on 07/02/2017 01/02/16   Hosie Poisson, MD    Family History Family History  Problem Relation Age of Onset  . Cancer Father     Social History Social History  Substance Use Topics  . Smoking status: Former Smoker    Packs/day: 0.50    Years: 2.00    Types: Cigarettes    Quit date: 09/10/1974  . Smokeless tobacco: Never Used     Comment: quit in teenage years  . Alcohol use No     Allergies   Aspirin; Eliquis [apixaban]; Nsaids; Other; Plavix [clopidogrel]; Sudafed [pseudoephedrine]; Erythromycin; Sulfa antibiotics; Sulfamethoxazole-trimethoprim; Altace [ramipril]; and Zofran [ondansetron hcl]   Review of Systems Review of Systems  Constitutional: Negative for fever.  Respiratory: Negative for shortness of breath.   Cardiovascular: Positive for leg swelling. Negative for chest pain.  Skin: Positive for color change and wound.     Physical Exam Updated Vital Signs BP (!) 125/59   Pulse 83   Temp 98 F (36.7 C) (Oral)   Resp 20   Ht 5\' 5"  (1.651 m)    Wt 136.1 kg (300 lb)   SpO2 100%   BMI 49.92 kg/m   Physical Exam  Constitutional: She appears well-developed and well-nourished.  Appears uncomfortable in no acute distress.  HENT:  Head: Normocephalic and atraumatic.  Eyes: Conjunctivae and EOM are normal. Right eye exhibits no discharge. Left eye exhibits no discharge. No scleral icterus.  Cardiovascular: Regular rhythm.  Tachycardia present.   Pulses:      Dorsalis pedis pulses are 2+ on the right side, and 1+ on the left side.  Pulmonary/Chest: Effort normal and breath sounds normal.  Neurological: She is alert.  Skin: Skin is warm and dry. Capillary  refill takes less than 2 seconds.  Small puncture wound noted to the left mid calf region that is actively draining clear drainage. Left lower leg is erythematous, warm, indurated with 1+ pitting edema that extends from just below the knee distally.   Psychiatric: She has a normal mood and affect. Her speech is normal and behavior is normal.  Nursing note and vitals reviewed.    ED Treatments / Results  DIAGNOSTIC STUDIES: Oxygen Saturation is 98% on RA, normal by my interpretation.   COORDINATION OF CARE: 11:39 AM-Discussed next steps with pt which includes antibiotics. Pt verbalized understanding and is agreeable with the plan.   Labs (all labs ordered are listed, but only abnormal results are displayed) Labs Reviewed  CBC WITH DIFFERENTIAL/PLATELET - Abnormal; Notable for the following:       Result Value   WBC 25.5 (*)    Hemoglobin 10.6 (*)    MCH 24.3 (*)    MCHC 29.4 (*)    RDW 16.9 (*)    Neutro Abs 23.2 (*)    Monocytes Absolute 1.3 (*)    All other components within normal limits  BASIC METABOLIC PANEL - Abnormal; Notable for the following:    Chloride 96 (*)    CO2 34 (*)    Glucose, Bld 154 (*)    Calcium 8.4 (*)    All other components within normal limits  GLUCOSE, CAPILLARY - Abnormal; Notable for the following:    Glucose-Capillary 127 (*)    All  other components within normal limits  AEROBIC CULTURE (SUPERFICIAL SPECIMEN)  CULTURE, BLOOD (ROUTINE X 2)  CULTURE, BLOOD (ROUTINE X 2)  TSH  HIV ANTIBODY (ROUTINE TESTING)  HEMOGLOBIN C7E  BASIC METABOLIC PANEL  CBC    EKG  EKG Interpretation None       Radiology No results found.  Procedures Procedures (including critical care time)  Medications Ordered in ED Medications  furosemide (LASIX) tablet 40 mg (not administered)  rivaroxaban (XARELTO) tablet 20 mg (20 mg Oral Not Given 07/02/17 1829)  buprenorphine-naloxone (SUBOXONE) 8-2 mg per SL tablet 1 tablet (1 tablet Sublingual Not Given 07/02/17 1829)  gabapentin (NEURONTIN) capsule 100 mg (not administered)  famotidine (PEPCID) tablet 20 mg (20 mg Oral Given 07/02/17 1830)  levothyroxine (SYNTHROID, LEVOTHROID) tablet 75 mcg (not administered)  metoprolol tartrate (LOPRESSOR) tablet 75 mg (not administered)  diltiazem (DILACOR XR) 24 hr capsule 240 mg (240 mg Oral Not Given 07/02/17 1828)  prochlorperazine (COMPAZINE) tablet 10 mg (not administered)  loratadine (CLARITIN) tablet 10 mg (10 mg Oral Given 07/02/17 1829)  clindamycin (CLEOCIN) IVPB 600 mg (not administered)  sodium chloride flush (NS) 0.9 % injection 3 mL (not administered)  sodium chloride flush (NS) 0.9 % injection 3 mL (not administered)  0.9 %  sodium chloride infusion (not administered)  acetaminophen (TYLENOL) tablet 650 mg (not administered)    Or  acetaminophen (TYLENOL) suppository 650 mg (not administered)  senna-docusate (Senokot-S) tablet 1 tablet (not administered)  insulin aspart (novoLOG) injection 0-20 Units (3 Units Subcutaneous Given 07/02/17 1934)  insulin aspart (novoLOG) injection 0-5 Units (not administered)  insulin aspart (novoLOG) injection 6 Units (6 Units Subcutaneous Not Given 07/02/17 1935)  sertraline (ZOLOFT) tablet 200 mg (not administered)  Tdap (BOOSTRIX) injection 0.5 mL (0.5 mLs Intramuscular Given 07/02/17 1356)    clindamycin (CLEOCIN) IVPB 600 mg (0 mg Intravenous Stopped 07/02/17 1602)     Initial Impression / Assessment and Plan / ED Course  I have reviewed the triage vital signs  and the nursing notes.  Pertinent labs & imaging results that were available during my care of the patient were reviewed by me and considered in my medical decision making (see chart for details).     61 year old female with past history of hypertension, diabetes, morbid obesity presents with 3 days of worsening left lower extremity pain, redness/swelling. Symptoms began after puncture wound 1 week ago. Patient is afebrile in the department. Vital signs reviewed she is slightly tachycardic and hypertensive, likely secondary to pain. Will reassess. Physical exam is consistent with cellulitis of the left lower extremity. Doubt DVT. Will plan to check patient's blood glucose, CBC, BMP. IVF given for fluid resuscitation.   Records reviewed showed that Patient was sent home on 06/29/17 with a prescription for Keflex to treat a UTI. She states that she has been compliant with medications.  Labs reviewed. CBC shows leukocytosis of 25.5. Given patient's medical history, and the fact that she has been on 48 hours of by mouth antibiotics and has had worsening symptoms, patient would likely benefit from admission for IV antibiotics. IV was started in the department. Will also obtain UA for evaluation of continued UTI. Will consult hospitalist for admission.  Discussed with hospitalist. Will plan to admit.  Final Clinical Impressions(s) / ED Diagnoses   Final diagnoses:  Cellulitis of left lower extremity    New Prescriptions Current Discharge Medication List     I personally performed the services described in this documentation, which was scribed in my presence. The recorded information has been reviewed and is accurate.     Volanda Napoleon, PA-C 07/02/17 2017    Lajean Saver, MD 07/03/17 [phone]

## 2017-07-02 NOTE — ED Triage Notes (Signed)
Patient is alert and oriented x4.  She is being seen for a wound on her left leg that she said started last week after she dropped a knife on her leg and the wound area has gotten worse.  Currently she rates her pain 10 of 10.

## 2017-07-02 NOTE — ED Notes (Signed)
Bed: QQ76 Expected date:  Expected time:  Means of arrival:  Comments: Hold for triage 5

## 2017-07-02 NOTE — ED Triage Notes (Signed)
Per EMS-states she accidentally stabbed herself with a letter opener to left lower leg--leg is red and warm to tough

## 2017-07-02 NOTE — ED Notes (Signed)
Bed: WTR5 Expected date:  Expected time:  Means of arrival:  Comments: 

## 2017-07-02 NOTE — Progress Notes (Signed)
Pt has declined the use of CPAP at this time.  Pt to notify RT if she decides to utilize.  RT to monitor and assess as needed.

## 2017-07-03 ENCOUNTER — Telehealth: Payer: Self-pay | Admitting: Emergency Medicine

## 2017-07-03 DIAGNOSIS — I2510 Atherosclerotic heart disease of native coronary artery without angina pectoris: Secondary | ICD-10-CM | POA: Diagnosis present

## 2017-07-03 DIAGNOSIS — N136 Pyonephrosis: Secondary | ICD-10-CM | POA: Diagnosis present

## 2017-07-03 DIAGNOSIS — Z23 Encounter for immunization: Secondary | ICD-10-CM | POA: Diagnosis not present

## 2017-07-03 DIAGNOSIS — I130 Hypertensive heart and chronic kidney disease with heart failure and stage 1 through stage 4 chronic kidney disease, or unspecified chronic kidney disease: Secondary | ICD-10-CM | POA: Diagnosis present

## 2017-07-03 DIAGNOSIS — Z888 Allergy status to other drugs, medicaments and biological substances status: Secondary | ICD-10-CM | POA: Diagnosis not present

## 2017-07-03 DIAGNOSIS — F988 Other specified behavioral and emotional disorders with onset usually occurring in childhood and adolescence: Secondary | ICD-10-CM | POA: Diagnosis present

## 2017-07-03 DIAGNOSIS — Z882 Allergy status to sulfonamides status: Secondary | ICD-10-CM | POA: Diagnosis not present

## 2017-07-03 DIAGNOSIS — G4733 Obstructive sleep apnea (adult) (pediatric): Secondary | ICD-10-CM | POA: Diagnosis not present

## 2017-07-03 DIAGNOSIS — J9612 Chronic respiratory failure with hypercapnia: Secondary | ICD-10-CM | POA: Diagnosis present

## 2017-07-03 DIAGNOSIS — G2581 Restless legs syndrome: Secondary | ICD-10-CM | POA: Diagnosis present

## 2017-07-03 DIAGNOSIS — Z6841 Body Mass Index (BMI) 40.0 and over, adult: Secondary | ICD-10-CM | POA: Diagnosis not present

## 2017-07-03 DIAGNOSIS — L03116 Cellulitis of left lower limb: Secondary | ICD-10-CM | POA: Diagnosis not present

## 2017-07-03 DIAGNOSIS — E1122 Type 2 diabetes mellitus with diabetic chronic kidney disease: Secondary | ICD-10-CM | POA: Diagnosis present

## 2017-07-03 DIAGNOSIS — N183 Chronic kidney disease, stage 3 (moderate): Secondary | ICD-10-CM | POA: Diagnosis present

## 2017-07-03 DIAGNOSIS — Z880 Allergy status to penicillin: Secondary | ICD-10-CM | POA: Diagnosis not present

## 2017-07-03 DIAGNOSIS — M79609 Pain in unspecified limb: Secondary | ICD-10-CM | POA: Diagnosis not present

## 2017-07-03 DIAGNOSIS — E1142 Type 2 diabetes mellitus with diabetic polyneuropathy: Secondary | ICD-10-CM | POA: Diagnosis not present

## 2017-07-03 DIAGNOSIS — K219 Gastro-esophageal reflux disease without esophagitis: Secondary | ICD-10-CM | POA: Diagnosis present

## 2017-07-03 DIAGNOSIS — Z886 Allergy status to analgesic agent status: Secondary | ICD-10-CM | POA: Diagnosis not present

## 2017-07-03 DIAGNOSIS — L8990 Pressure ulcer of unspecified site, unspecified stage: Secondary | ICD-10-CM | POA: Insufficient documentation

## 2017-07-03 DIAGNOSIS — E039 Hypothyroidism, unspecified: Secondary | ICD-10-CM | POA: Diagnosis present

## 2017-07-03 DIAGNOSIS — I480 Paroxysmal atrial fibrillation: Secondary | ICD-10-CM | POA: Diagnosis not present

## 2017-07-03 DIAGNOSIS — R600 Localized edema: Secondary | ICD-10-CM | POA: Diagnosis not present

## 2017-07-03 DIAGNOSIS — M7989 Other specified soft tissue disorders: Secondary | ICD-10-CM | POA: Diagnosis not present

## 2017-07-03 DIAGNOSIS — I10 Essential (primary) hypertension: Secondary | ICD-10-CM | POA: Diagnosis not present

## 2017-07-03 DIAGNOSIS — I5042 Chronic combined systolic (congestive) and diastolic (congestive) heart failure: Secondary | ICD-10-CM | POA: Diagnosis present

## 2017-07-03 DIAGNOSIS — B351 Tinea unguium: Secondary | ICD-10-CM | POA: Diagnosis present

## 2017-07-03 DIAGNOSIS — Z8542 Personal history of malignant neoplasm of other parts of uterus: Secondary | ICD-10-CM | POA: Diagnosis not present

## 2017-07-03 LAB — BASIC METABOLIC PANEL
Anion gap: 8 (ref 5–15)
BUN: 18 mg/dL (ref 6–20)
CHLORIDE: 96 mmol/L — AB (ref 101–111)
CO2: 36 mmol/L — AB (ref 22–32)
CREATININE: 1.07 mg/dL — AB (ref 0.44–1.00)
Calcium: 8.2 mg/dL — ABNORMAL LOW (ref 8.9–10.3)
GFR calc non Af Amer: 54 mL/min — ABNORMAL LOW (ref >60)
Glucose, Bld: 124 mg/dL — ABNORMAL HIGH (ref 65–99)
Potassium: 3.7 mmol/L (ref 3.5–5.1)
Sodium: 140 mmol/L (ref 135–145)

## 2017-07-03 LAB — CBC
HCT: 35.6 % — ABNORMAL LOW (ref 36.0–46.0)
Hemoglobin: 10.1 g/dL — ABNORMAL LOW (ref 12.0–15.0)
MCH: 23.8 pg — ABNORMAL LOW (ref 26.0–34.0)
MCHC: 28.4 g/dL — ABNORMAL LOW (ref 30.0–36.0)
MCV: 83.8 fL (ref 78.0–100.0)
PLATELETS: 211 10*3/uL (ref 150–400)
RBC: 4.25 MIL/uL (ref 3.87–5.11)
RDW: 17 % — ABNORMAL HIGH (ref 11.5–15.5)
WBC: 18 10*3/uL — ABNORMAL HIGH (ref 4.0–10.5)

## 2017-07-03 LAB — GLUCOSE, CAPILLARY
GLUCOSE-CAPILLARY: 101 mg/dL — AB (ref 65–99)
GLUCOSE-CAPILLARY: 110 mg/dL — AB (ref 65–99)
Glucose-Capillary: 108 mg/dL — ABNORMAL HIGH (ref 65–99)
Glucose-Capillary: 138 mg/dL — ABNORMAL HIGH (ref 65–99)
Glucose-Capillary: 129 mg/dL — ABNORMAL HIGH (ref 65–99)

## 2017-07-03 LAB — HIV ANTIBODY (ROUTINE TESTING W REFLEX): HIV SCREEN 4TH GENERATION: NONREACTIVE

## 2017-07-03 LAB — HEMOGLOBIN A1C
Hgb A1c MFr Bld: 6.7 % — ABNORMAL HIGH (ref 4.8–5.6)
Mean Plasma Glucose: 146 mg/dL

## 2017-07-03 MED ORDER — CEPHALEXIN 500 MG PO CAPS
500.0000 mg | ORAL_CAPSULE | Freq: Four times a day (QID) | ORAL | Status: DC
  Administered 2017-07-03 – 2017-07-04 (×6): 500 mg via ORAL
  Filled 2017-07-03 (×5): qty 1

## 2017-07-03 NOTE — Progress Notes (Signed)
Pt continues to refuse CPAP QHS.  RT to monitor and assess as needed.  

## 2017-07-03 NOTE — Care Management Note (Signed)
Case Management Note  Patient Details  Name: NYLEAH MCGINNIS MRN: [id number] Date of Birth: [birth year]  Subjective/Objective:       Sepsis wound to left lower leg             Action/Plan: Date:  July 03, 2017 Chart reviewed for concurrent status and case management needs. Will continue to follow patient progress. Discharge Planning: following for needs Expected discharge date: [id number] Velva Harman, BSN, [place], [place]  Expected Discharge Date:   (unknown)               Expected Discharge Plan:  [place]  In-House Referral:     Discharge planning Services  CM Consult  Post Acute Care Choice:    Choice offered to:     DME Arranged:    DME Agency:     HH Arranged:    [place] Agency:     Status of Service:  In process, will continue to follow  If discussed at Long Length of Stay Meetings, dates discussed:    Additional Comments:  Leeroy Cha, RN 07/03/2017, 8:04 AM

## 2017-07-03 NOTE — Telephone Encounter (Signed)
Post ED Visit - Positive Culture Follow-up  Culture report reviewed by antimicrobial stewardship pharmacist:  []  Elenor Quinones, Pharm.D. []  Heide Guile, Pharm.D., BCPS AQ-ID []  Parks Neptune, Pharm.D., BCPS []  Alycia Rossetti, Pharm.D., BCPS []  [place], Pharm.D., BCPS, AAHIVP []  Legrand Como, Pharm.D., BCPS, AAHIVP []  Salome Arnt, PharmD, BCPS []  Dimitri Ped, PharmD, BCPS []  Vincenza Hews, PharmD, BCPS Jimmy Footman PharmD  Positive urine culture Treated with cephalexin, organism sensitive to the same and no further patient follow-up is required at this time.  Hazle Nordmann 07/03/2017, 10:42 AM

## 2017-07-03 NOTE — Progress Notes (Signed)
PROGRESS NOTE    [name]  PQZ:[id number] DOB: Oct 19, 1956 DOA: 07/02/2017 PCP: Dixie Dials, MD    Brief Narrative: [name] is a 61 y.o. female with multiple medical cor morbidities including chronic hypercapnic respiratory failure, morbid obesity, diabetes, hypertension, GERD among other issues who presents to the hospital today with the above complaints. She was seen in the emergency department on 7/20 for UTI and discharged home on Keflex. The day after this visit she started noticing pain and redness to her left leg that has become progressively worse over the past 3 days which prompts her to come back to the hospital today for evaluation. She has had subjective fevers and chills. Afebrile in the ED, labs are significant for a white blood cell count of 25.5 and admission is requested.   Assessment & Plan:   Principal Problem:   Cellulitis of leg, left Active Problems:   OSA (obstructive sleep apnea)   Hypercapnic respiratory failure, chronic (HCC)   Diabetes mellitus (HCC)   HTN (hypertension)   Obesity, Class III, BMI 40-49.9 (morbid obesity) ([place])   Pressure injury of skin  Left leg cellulitis She was on keflex for UTI prior to admission.  Will continue with Clindamycin.  Continue with lasix.   UTI; Recently diagnosed 7-20 on her visit to ED. She was also having pain at her urethral meatus during that visit at ED. CT . Mild left hydronephrosis and perinephric edema. There is a probable 3 mm stone in the urethra, likely recently passed stone. The tentatively, the source of hydronephrosis may be a punctate stone in the left renal pelvis that is intermittently obstructing. 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 Additional nonobstructing bilateral renal calculi. Patient reported resolution of pain and was discharge home on Keflex.  Monitor renal function and if no improvement of leukocytosis might need urology evaluation  Continue with Keflex.  Urine  grew E coli and strep.   Diabetes -Continue with SSI.  -Hb A 1 c at 6.7. -will discontinue meals coverage.   Paroxysmal A. Fib -continue with Cardizem and metoprolol.  -Anticoagulation stopped by PCP due to bleeding.   Chronic hypercapnic respiratory failure -Due mainly to morbid obesity/OSA. -Continue oxygen, nightly C Pap. But she refuses, counseled provided.   Benign essential hypertension -continue with lasix, Cardizem, metoprolol. Lasix.   Hypothyroidism -continue with Synthroid.  -TSH; 3.8     DVT prophylaxis: SCD, discontinue xarelto, she has not been taking it as advised by her Dr due to history of bleeding.  Code Status:  Full code.  Family Communication: care discussed with patient  Disposition Plan: home at time of discharge, await improvement of cellulitis.    Consultants:   none  Procedures: none   Antimicrobials:  Clindamycin    Subjective: Patient still complaining of pain let LE , no significant improvement of edema or redness.   Objective: Vitals:   07/02/17 1826 07/02/17 2023 07/03/17 0455 07/03/17 1352  BP: (!) 125/59 122/72 117/64 (!) 108/59  Pulse: 83 (!) 105 (!) 105 78  Resp: 20 18 20 18   Temp: 98 F (36.7 C) 99.2 F (37.3 C) 98.2 F (36.8 C) (!) 97.5 F (36.4 C)  TempSrc: Oral Oral Oral Oral  SpO2: 100% 97% 98% 97%  Weight:      Height:        Intake/Output Summary (Last 24 hours) at 07/03/17 1413 Last data filed at 07/03/17 0928  Gross per 24 hour  Intake  240 ml  Output              400 ml  Net             -160 ml   Filed Weights   07/02/17 1058  Weight: 136.1 kg (300 lb)    Examination:  General exam: Appears calm and comfortable  Respiratory system: Clear to auscultation. Respiratory effort normal. Cardiovascular system: S1 & S2 heard, RRR. No JVD, murmurs, rubs, gallops or clicks. No pedal edema. Gastrointestinal system: Abdomen is nondistended, soft and nontender. No organomegaly or masses  felt. Normal bowel sounds heard. Central nervous system: Alert and oriented. No focal neurological deficits. Extremities: Symmetric 5 x 5 power. Skin: left LE with diffuse redness up to below knee, edema.  Psychiatry: Judgement and insight appear normal. Mood & affect appropriate.     Data Reviewed: I have personally reviewed following labs and imaging studies  CBC:  Recent Labs Lab 07/02/17 1340 07/03/17 0551  WBC 25.5* 18.0*  NEUTROABS 23.2*  --   HGB 10.6* 10.1*  HCT 36.1 35.6*  MCV 82.8 83.8  PLT 232 222   Basic Metabolic Panel:  Recent Labs Lab 07/02/17 1340 07/03/17 0551  NA 137 140  K 4.0 3.7  CL 96* 96*  CO2 34* 36*  GLUCOSE 154* 124*  BUN 16 18  CREATININE 0.97 1.07*  CALCIUM 8.4* 8.2*   GFR: Estimated Creatinine Clearance: 75.3 mL/min (A) (by C-G formula based on SCr of 1.07 mg/dL (H)). Liver Function Tests: No results for input(s): AST, ALT, ALKPHOS, BILITOT, PROT, ALBUMIN in the last 168 hours. No results for input(s): LIPASE, AMYLASE in the last 168 hours. No results for input(s): AMMONIA in the last 168 hours. Coagulation Profile: No results for input(s): INR, PROTIME in the last 168 hours. Cardiac Enzymes: No results for input(s): CKTOTAL, CKMB, CKMBINDEX, TROPONINI in the last 168 hours. BNP (last 3 results) No results for input(s): PROBNP in the last 8760 hours. HbA1C:  Recent Labs  07/02/17 1732  HGBA1C 6.7*   CBG:  Recent Labs Lab 07/02/17 1729 07/02/17 2027 07/03/17 0714 07/03/17 1141  GLUCAP 127* 166* 108* 138*   Lipid Profile: No results for input(s): CHOL, HDL, LDLCALC, TRIG, CHOLHDL, LDLDIRECT in the last 72 hours. Thyroid Function Tests:  Recent Labs  07/02/17 1743  TSH 3.802   Anemia Panel: No results for input(s): VITAMINB12, FOLATE, FERRITIN, TIBC, IRON, RETICCTPCT in the last 72 hours. Sepsis Labs: No results for input(s): PROCALCITON, LATICACIDVEN in the last 168 hours.  Recent Results (from the past 240  hour(s))  Urine culture     Status: Abnormal   Collection Time: 06/29/17 11:25 PM  Result Value Ref Range Status   Specimen Description URINE, RANDOM  Final   Special Requests NONE  Final   Culture (A)  Final    30,000 COLONIES/mL ESCHERICHIA COLI 20,000 COLONIES/mL STREPTOCOCCUS GROUP C    Report Status 07/02/2017 FINAL  Final   Organism ID, Bacteria ESCHERICHIA COLI (A)  Final      Susceptibility   Escherichia coli - MIC*    AMPICILLIN >=32 RESISTANT Resistant     CEFAZOLIN <=4 SENSITIVE Sensitive     CEFTRIAXONE <=1 SENSITIVE Sensitive     CIPROFLOXACIN 1 SENSITIVE Sensitive     GENTAMICIN <=1 SENSITIVE Sensitive     IMIPENEM <=0.25 SENSITIVE Sensitive     NITROFURANTOIN <=16 SENSITIVE Sensitive     TRIMETH/SULFA <=20 SENSITIVE Sensitive     AMPICILLIN/SULBACTAM 16 INTERMEDIATE Intermediate  PIP/TAZO <=4 SENSITIVE Sensitive     Extended ESBL NEGATIVE Sensitive     * 30,000 COLONIES/mL ESCHERICHIA COLI  Wound or Superficial Culture     Status: None (Preliminary result)   Collection Time: 07/02/17  1:57 PM  Result Value Ref Range Status   Specimen Description LEG LEFT  Final   Special Requests NONE  Final   Gram Stain NO WBC SEEN NO ORGANISMS SEEN   Final   Culture   Final    CULTURE REINCUBATED FOR BETTER GROWTH Performed at [facility], [address], [place], [place] [address]    Report Status PENDING  Incomplete         Radiology Studies: No results found.      Scheduled Meds: . buprenorphine  8 mg Sublingual BID  . cephALEXin  500 mg Oral Q6H  . diltiazem  240 mg Oral Daily  . famotidine  20 mg Oral Daily  . furosemide  40 mg Oral Daily  . gabapentin  100 mg Oral QHS  . insulin aspart  0-20 Units Subcutaneous TID WC  . insulin aspart  0-5 Units Subcutaneous QHS  . insulin aspart  6 Units Subcutaneous TID WC  . levothyroxine  75 mcg Oral QAC breakfast  . loratadine  10 mg Oral Daily  . metoprolol tartrate  75 mg Oral BID  . sertraline   200 mg Oral Daily  . sodium chloride flush  3 mL Intravenous Q12H   Continuous Infusions: . sodium chloride    . clindamycin (CLEOCIN) IV 600 mg (07/03/17 1402)     LOS: 0 days    Time spent: 35 minutes.     Elmarie Shiley, MD [facility] Pager [phone]  If 7PM-7AM, please contact night-coverage [email or web address] Password [email or web address] 07/03/2017, 2:13 PM

## 2017-07-04 DIAGNOSIS — I480 Paroxysmal atrial fibrillation: Secondary | ICD-10-CM

## 2017-07-04 LAB — CBC WITH DIFFERENTIAL/PLATELET
BASOS ABS: 0 10*3/uL (ref 0.0–0.1)
Basophils Relative: 0 %
EOS ABS: 0.3 10*3/uL (ref 0.0–0.7)
Eosinophils Relative: 2 %
HCT: 34.2 % — ABNORMAL LOW (ref 36.0–46.0)
HEMOGLOBIN: 9.8 g/dL — AB (ref 12.0–15.0)
LYMPHS ABS: 0.9 10*3/uL (ref 0.7–4.0)
LYMPHS PCT: 7 %
MCH: 24 pg — AB (ref 26.0–34.0)
MCHC: 28.7 g/dL — ABNORMAL LOW (ref 30.0–36.0)
MCV: 83.6 fL (ref 78.0–100.0)
Monocytes Absolute: 0.8 10*3/uL (ref 0.1–1.0)
Monocytes Relative: 6 %
NEUTROS PCT: 85 %
Neutro Abs: 10.7 10*3/uL — ABNORMAL HIGH (ref 1.7–7.7)
PLATELETS: 237 10*3/uL (ref 150–400)
RBC: 4.09 MIL/uL (ref 3.87–5.11)
RDW: 17 % — ABNORMAL HIGH (ref 11.5–15.5)
WBC: 12.6 10*3/uL — AB (ref 4.0–10.5)

## 2017-07-04 LAB — GLUCOSE, CAPILLARY
GLUCOSE-CAPILLARY: 125 mg/dL — AB (ref 65–99)
GLUCOSE-CAPILLARY: 118 mg/dL — AB (ref 65–99)
GLUCOSE-CAPILLARY: 129 mg/dL — AB (ref 65–99)
Glucose-Capillary: 173 mg/dL — ABNORMAL HIGH (ref 65–99)

## 2017-07-04 LAB — BASIC METABOLIC PANEL
ANION GAP: 7 (ref 5–15)
BUN: 20 mg/dL (ref 6–20)
CHLORIDE: 94 mmol/L — AB (ref 101–111)
CO2: 37 mmol/L — ABNORMAL HIGH (ref 22–32)
Calcium: 8 mg/dL — ABNORMAL LOW (ref 8.9–10.3)
Creatinine, Ser: 1.1 mg/dL — ABNORMAL HIGH (ref 0.44–1.00)
GFR calc Af Amer: >60 mL/min (ref >60)
GFR, EST NON AFRICAN AMERICAN: 52 mL/min — AB (ref >60)
Glucose, Bld: 158 mg/dL — ABNORMAL HIGH (ref 65–99)
POTASSIUM: 3.8 mmol/L (ref 3.5–5.1)
SODIUM: 138 mmol/L (ref 135–145)

## 2017-07-04 MED ORDER — FLUCONAZOLE 100 MG PO TABS
200.0000 mg | ORAL_TABLET | ORAL | Status: DC
  Administered 2017-07-04 – 2017-07-11 (×2): 200 mg via ORAL
  Filled 2017-07-04: qty 2
  Filled 2017-07-04: qty 1

## 2017-07-04 MED ORDER — CEFAZOLIN SODIUM-DEXTROSE 2-4 GM/100ML-% IV SOLN
2.0000 g | Freq: Three times a day (TID) | INTRAVENOUS | Status: DC
  Administered 2017-07-04 – 2017-07-09 (×14): 2 g via INTRAVENOUS
  Filled 2017-07-04 (×15): qty 100

## 2017-07-04 MED ORDER — FUROSEMIDE 40 MG PO TABS
80.0000 mg | ORAL_TABLET | Freq: Every day | ORAL | Status: DC
  Administered 2017-07-05 – 2017-07-10 (×6): 80 mg via ORAL
  Filled 2017-07-04 (×8): qty 2

## 2017-07-04 MED ORDER — FUROSEMIDE 10 MG/ML IJ SOLN
20.0000 mg | Freq: Once | INTRAMUSCULAR | Status: AC
  Administered 2017-07-04: 20 mg via INTRAVENOUS
  Filled 2017-07-04: qty 2

## 2017-07-04 NOTE — Progress Notes (Signed)
Pharmacy Antibiotic Note  [name] is a 61 y.o. female admitted on 07/02/2017 with cellulitis and recent UTI.  Pharmacy has been consulted for cefazolin dosing.  Plan:  Cefazolin 2g IV q8h - high dose due to obesity  No further dose adjustments needed  Height: 5\' 5"  (165.1 cm) Weight: 300 lb (136.1 kg) IBW/kg (Calculated) : 57  Temp (24hrs), Avg:97.9 F (36.6 C), Min:97.8 F (36.6 C), Max:98 F (36.7 C)   Recent Labs Lab 07/02/17 1340 07/03/17 0551 07/04/17 0900  WBC 25.5* 18.0* 12.6*  CREATININE 0.97 1.07* 1.10*    Estimated Creatinine Clearance: 73.2 mL/min (A) (by C-G formula based on SCr of 1.1 mg/dL (H)).    Allergies  Allergen Reactions  . Aspirin Other (See Comments)    Chest pain  . Eliquis [Apixaban]     Excessive bleeding  . Nsaids Other (See Comments)    Chest pain  . Other Other (See Comments)    "BP med" combined with plavix, caused syncope  . Plavix [Clopidogrel] Other (See Comments)    syncope  . Sudafed [Pseudoephedrine] Palpitations    Raised BP  . Erythromycin Nausea And Vomiting  . Sulfa Antibiotics Other (See Comments)    constipation  . Sulfamethoxazole-Trimethoprim Diarrhea  . Altace [Ramipril] Other (See Comments)    Severe fatigue  . Zofran [Ondansetron Hcl] Other (See Comments)    "I put me into a coma"    Antimicrobials this admission: 7/20 Keflex >> 7/25 7/23 Clindamycin >> 7/25 Cefazolin >>  Dose adjustments this admission:   Microbiology results: 7/20 UCx: 30k E.coli (R amp, I unasyn, S all others)                  20k Group C strep 7/23 L leg: normal skin flora 7/23 BCx: ngtd  Thank you for allowing pharmacy to be a part of this patient's care.  Peggyann Juba, PharmD, BCPS Pager: [phone] 07/04/2017 5:53 PM

## 2017-07-04 NOTE — Progress Notes (Signed)
Pt refused CPAP qhs.  Pt states she is unable to tolerate hospital equipment.  RT stated that we had new CPAP masks we could try out but still refused.  Pt encouraged to contact RT should she change her mind.

## 2017-07-04 NOTE — Progress Notes (Signed)
PROGRESS NOTE [facility]   [name]   XBJ:[id number] DOB: 29-Jan-1956  DOA: 07/02/2017 PCP: Dixie Dials, MD   Brief Narrative:  61 year old female with medical history of chronic hypercapnic respiratory failure, morbidly obese, diabetes, hypertension, GERD presented to the hospital complaining of leg swelling and erythema. She was seen in the emergency department on July 2018 for UTI and was discharged home with Keflex. Patient reported that after ED visit her left leg has become progressively swelling and erythematous prompting medical examination. In the ED was found to have erythematous left leg and white count of 25.5. She was admitted with left leg cellulitis and was placed on IV antibiotics.  Subjective: Patient seen and examined, she reports doing slightly better, although no significant improvement. No other complaints patient reported her breathing is at baseline.  Assessment & Plan: Left leg cellulitis with superimposed onychomycosis She was on Keflex prior to admission with change to cefazolin IV Continue IV clindamycin We will get MRSA screen We'll treat with Diflucan 200 mg weekly for 4-6 weeks - check LFTs in the morning Continue Lasix Blood cultures negative 2  UTI; Recently diagnosed 7/20 on her visit to ED. She was also having pain at her urethral meatus during that visit at ED. CT . Mild left hydronephrosis and perinephric edema. There is a probable 3 mm stone in the urethra, likely recently passed stone. The tentatively, the source of hydronephrosis may be a punctate stone in the left renal pelvis that is intermittently obstructing. 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 Additional nonobstructing bilateral renal calculi. Patient reported resolution of pain and was discharge home on Keflex.  Urine grew E coli and strep.  Continue antibiotics  Diabetes Continue with SSI.  Hb A 1 c at 6.7. Medical coverage was discontinued, CBGs  remained stable  Paroxysmal A. Fib Continue with Cardizem and metoprolol.  Anticoagulation stopped by PCP due to bleeding.   Chronic hypercapnic respiratory failure Due mainly to morbid obesity/OSA. Continue oxygen, nightly C Pap.  Benign essential hypertension BP stable  Continue with Lasix, Cardizem, Metoprolol.  Hypothyroidism Stable  Continue synthroid   DVT prophylaxis: SCDs Code Status: Full code Family Communication: None at bedside Disposition Plan: Home when medically stable  Consultants:   None  Procedures:   None  Antimicrobials: Anti-infectives    Start     Dose/Rate Route Frequency Ordered Stop   07/03/17 1245  cephALEXin (KEFLEX) capsule 500 mg  Status:  Discontinued    Comments:  Continue thru 7/27 to complete 7d course ordered on 7/21   500 mg Oral Every 6 hours 07/03/17 1238 07/04/17 1736   07/02/17 2300  clindamycin (CLEOCIN) IVPB 600 mg     600 mg 100 mL/hr over 30 Minutes Intravenous Every 8 hours 07/02/17 1612     07/02/17 1430  clindamycin (CLEOCIN) IVPB 600 mg     600 mg 100 mL/hr over 30 Minutes Intravenous  Once 07/02/17 1416 07/02/17 1602   07/02/17 1415  clindamycin (CLEOCIN) IVPB 600 mg  Status:  Discontinued     600 mg 100 mL/hr over 30 Minutes Intravenous  Once 07/02/17 1414 07/02/17 1415       Objective: Vitals:   07/03/17 1352 07/03/17 2156 07/04/17 0608 07/04/17 1347  BP: (!) 108/59 138/76 116/64 (!) 124/57  Pulse: 78 93 99 98  Resp: 18 20 20 20   Temp: (!) 97.5 F (36.4 C) 97.9 F (36.6 C) 98 F (36.7 C) 97.8 F (36.6 C)  TempSrc: Oral Oral Oral  Oral  SpO2: 97% 100% 92% 95%  Weight:      Height:        Intake/Output Summary (Last 24 hours) at 07/04/17 1733 Last data filed at 07/04/17 1600  Gross per 24 hour  Intake           1083.5 ml  Output              800 ml  Net            283.5 ml   Filed Weights   07/02/17 1058  Weight: 136.1 kg (300 lb)    Examination:  General exam: NAD HEENT: OP moist and  clear Respiratory system: Clear to auscultation. No wheezes,crackle or rhonchi Cardiovascular system: S1 & S2 heard, RRR. No JVD, murmurs, rubs or gallops Gastrointestinal system: Obese, Abdomen is nondistended, soft and nontender. Central nervous system: Alert and oriented. No focal neurological deficits. Extremities:  Symmetric, strength 5/5  Pedal pulses distant. Skin: Left lower extremity diffuse erythema below the knee with 2+ pitting edema. Psychiatry: Judgement and insight appear normal. Mood & affect appropriate.   Data Reviewed: I have personally reviewed following labs and imaging studies  CBC:  Recent Labs Lab 07/02/17 1340 07/03/17 0551 07/04/17 0900  WBC 25.5* 18.0* 12.6*  NEUTROABS 23.2*  --  10.7*  HGB 10.6* 10.1* 9.8*  HCT 36.1 35.6* 34.2*  MCV 82.8 83.8 83.6  PLT 232 211 403   Basic Metabolic Panel:  Recent Labs Lab 07/02/17 1340 07/03/17 0551 07/04/17 0900  NA 137 140 138  K 4.0 3.7 3.8  CL 96* 96* 94*  CO2 34* 36* 37*  GLUCOSE 154* 124* 158*  BUN 16 18 20   CREATININE 0.97 1.07* 1.10*  CALCIUM 8.4* 8.2* 8.0*   GFR: Estimated Creatinine Clearance: 73.2 mL/min (A) (by C-G formula based on SCr of 1.1 mg/dL (H)). Liver Function Tests: No results for input(s): AST, ALT, ALKPHOS, BILITOT, PROT, ALBUMIN in the last 168 hours. No results for input(s): LIPASE, AMYLASE in the last 168 hours. No results for input(s): AMMONIA in the last 168 hours. Coagulation Profile: No results for input(s): INR, PROTIME in the last 168 hours. Cardiac Enzymes: No results for input(s): CKTOTAL, CKMB, CKMBINDEX, TROPONINI in the last 168 hours. BNP (last 3 results) No results for input(s): PROBNP in the last 8760 hours. HbA1C:  Recent Labs  07/02/17 1732  HGBA1C 6.7*   CBG:  Recent Labs Lab 07/03/17 2123 07/03/17 2200 07/04/17 0736 07/04/17 1152 07/04/17 1641  GLUCAP 101* 110* 125* 118* 173*   Lipid Profile: No results for input(s): CHOL, HDL, LDLCALC,  TRIG, CHOLHDL, LDLDIRECT in the last 72 hours. Thyroid Function Tests:  Recent Labs  07/02/17 1743  TSH 3.802   Anemia Panel: No results for input(s): VITAMINB12, FOLATE, FERRITIN, TIBC, IRON, RETICCTPCT in the last 72 hours. Sepsis Labs: No results for input(s): PROCALCITON, LATICACIDVEN in the last 168 hours.  Recent Results (from the past 240 hour(s))  Urine culture     Status: Abnormal   Collection Time: 06/29/17 11:25 PM  Result Value Ref Range Status   Specimen Description URINE, RANDOM  Final   Special Requests NONE  Final   Culture (A)  Final    30,000 COLONIES/mL ESCHERICHIA COLI 20,000 COLONIES/mL STREPTOCOCCUS GROUP C    Report Status 07/02/2017 FINAL  Final   Organism ID, Bacteria ESCHERICHIA COLI (A)  Final      Susceptibility   Escherichia coli - MIC*    AMPICILLIN >=32 RESISTANT Resistant  CEFAZOLIN <=4 SENSITIVE Sensitive     CEFTRIAXONE <=1 SENSITIVE Sensitive     CIPROFLOXACIN 1 SENSITIVE Sensitive     GENTAMICIN <=1 SENSITIVE Sensitive     IMIPENEM <=0.25 SENSITIVE Sensitive     NITROFURANTOIN <=16 SENSITIVE Sensitive     TRIMETH/SULFA <=20 SENSITIVE Sensitive     AMPICILLIN/SULBACTAM 16 INTERMEDIATE Intermediate     PIP/TAZO <=4 SENSITIVE Sensitive     Extended ESBL NEGATIVE Sensitive     * 30,000 COLONIES/mL ESCHERICHIA COLI  Wound or Superficial Culture     Status: None (Preliminary result)   Collection Time: 07/02/17  1:57 PM  Result Value Ref Range Status   Specimen Description LEG LEFT  Final   Special Requests NONE  Final   Gram Stain NO WBC SEEN NO ORGANISMS SEEN   Final   Culture   Final    RARE NORMAL SKIN FLORA Performed at [facility], [place] [address], [place], [place] [address]    Report Status PENDING  Incomplete  Culture, blood (routine x 2)     Status: None (Preliminary result)   Collection Time: 07/02/17  5:32 PM  Result Value Ref Range Status   Specimen Description BLOOD RIGHT ANTECUBITAL  Final   Special Requests    Final    BOTTLES DRAWN AEROBIC AND ANAEROBIC Blood Culture adequate volume   Culture   Final    NO GROWTH 2 DAYS Performed at [facility], [place] [address], [place], [place] [address]    Report Status PENDING  Incomplete  Culture, blood (routine x 2)     Status: None (Preliminary result)   Collection Time: 07/02/17  5:43 PM  Result Value Ref Range Status   Specimen Description BLOOD RIGHT ANTECUBITAL  Final   Special Requests   Final    BOTTLES DRAWN AEROBIC ONLY Blood Culture adequate volume   Culture   Final    NO GROWTH 2 DAYS Performed at [facility], [place] [address], [place], [place] [address]    Report Status PENDING  Incomplete         Radiology Studies: No results found.    Scheduled Meds: . buprenorphine  8 mg Sublingual BID  . cephALEXin  500 mg Oral Q6H  . diltiazem  240 mg Oral Daily  . famotidine  20 mg Oral Daily  . furosemide  40 mg Oral Daily  . gabapentin  100 mg Oral QHS  . insulin aspart  0-20 Units Subcutaneous TID WC  . insulin aspart  0-5 Units Subcutaneous QHS  . levothyroxine  75 mcg Oral QAC breakfast  . loratadine  10 mg Oral Daily  . metoprolol tartrate  75 mg Oral BID  . sertraline  200 mg Oral Daily  . sodium chloride flush  3 mL Intravenous Q12H   Continuous Infusions: . sodium chloride 250 mL (07/03/17 1743)  . clindamycin (CLEOCIN) IV Stopped (07/04/17 1543)     LOS: 1 day    Time spent: Total of 25 minutes spent with pt, greater than 50% of which was spent in discussion of  treatment, counseling and coordination of care    Chipper Oman, MD Pager: Text Page via [email or web address]  [phone]  If 7PM-7AM, please contact night-coverage [email or web address] Password [facility] 07/04/2017, 5:33 PM

## 2017-07-05 ENCOUNTER — Inpatient Hospital Stay (HOSPITAL_COMMUNITY): Payer: Medicare Other

## 2017-07-05 DIAGNOSIS — E1142 Type 2 diabetes mellitus with diabetic polyneuropathy: Secondary | ICD-10-CM

## 2017-07-05 DIAGNOSIS — M79609 Pain in unspecified limb: Secondary | ICD-10-CM

## 2017-07-05 LAB — COMPREHENSIVE METABOLIC PANEL
ALT: 13 U/L — ABNORMAL LOW (ref 14–54)
AST: 17 U/L (ref 15–41)
Albumin: 2.7 g/dL — ABNORMAL LOW (ref 3.5–5.0)
Alkaline Phosphatase: 91 U/L (ref 38–126)
Anion gap: 7 (ref 5–15)
BUN: 18 mg/dL (ref 6–20)
CHLORIDE: 92 mmol/L — AB (ref 101–111)
CO2: 41 mmol/L — ABNORMAL HIGH (ref 22–32)
Calcium: 8 mg/dL — ABNORMAL LOW (ref 8.9–10.3)
Creatinine, Ser: 1.2 mg/dL — ABNORMAL HIGH (ref 0.44–1.00)
GFR, EST AFRICAN AMERICAN: 55 mL/min — AB (ref >60)
GFR, EST NON AFRICAN AMERICAN: 47 mL/min — AB (ref >60)
Glucose, Bld: 140 mg/dL — ABNORMAL HIGH (ref 65–99)
POTASSIUM: 3.7 mmol/L (ref 3.5–5.1)
Sodium: 140 mmol/L (ref 135–145)
Total Bilirubin: 0.3 mg/dL (ref 0.3–1.2)
Total Protein: 7.2 g/dL (ref 6.5–8.1)

## 2017-07-05 LAB — CBC WITH DIFFERENTIAL/PLATELET
Basophils Absolute: 0 10*3/uL (ref 0.0–0.1)
Basophils Relative: 0 %
Eosinophils Absolute: 0.3 10*3/uL (ref 0.0–0.7)
Eosinophils Relative: 3 %
HCT: 33.8 % — ABNORMAL LOW (ref 36.0–46.0)
HEMOGLOBIN: 9.7 g/dL — AB (ref 12.0–15.0)
LYMPHS ABS: 1.2 10*3/uL (ref 0.7–4.0)
LYMPHS PCT: 12 %
MCH: 23.8 pg — AB (ref 26.0–34.0)
MCHC: 28.7 g/dL — ABNORMAL LOW (ref 30.0–36.0)
MCV: 82.8 fL (ref 78.0–100.0)
Monocytes Absolute: 0.8 10*3/uL (ref 0.1–1.0)
Monocytes Relative: 8 %
NEUTROS PCT: 77 %
Neutro Abs: 7.7 10*3/uL (ref 1.7–7.7)
Platelets: 240 10*3/uL (ref 150–400)
RBC: 4.08 MIL/uL (ref 3.87–5.11)
RDW: 17 % — ABNORMAL HIGH (ref 11.5–15.5)
WBC: 10 10*3/uL (ref 4.0–10.5)

## 2017-07-05 LAB — MRSA PCR SCREENING: MRSA by PCR: NEGATIVE

## 2017-07-05 LAB — AEROBIC CULTURE  (SUPERFICIAL SPECIMEN)
CULTURE: NORMAL
GRAM STAIN: NONE SEEN

## 2017-07-05 LAB — GLUCOSE, CAPILLARY
GLUCOSE-CAPILLARY: 120 mg/dL — AB (ref 65–99)
GLUCOSE-CAPILLARY: 163 mg/dL — AB (ref 65–99)
GLUCOSE-CAPILLARY: 126 mg/dL — AB (ref 65–99)
Glucose-Capillary: 142 mg/dL — ABNORMAL HIGH (ref 65–99)

## 2017-07-05 LAB — AEROBIC CULTURE W GRAM STAIN (SUPERFICIAL SPECIMEN)

## 2017-07-05 MED ORDER — GABAPENTIN 100 MG PO CAPS
100.0000 mg | ORAL_CAPSULE | Freq: Three times a day (TID) | ORAL | Status: DC
  Administered 2017-07-05 – 2017-07-11 (×19): 100 mg via ORAL
  Filled 2017-07-05 (×19): qty 1

## 2017-07-05 MED ORDER — NYSTATIN 100000 UNIT/GM EX POWD
Freq: Two times a day (BID) | CUTANEOUS | Status: DC
  Administered 2017-07-06 – 2017-07-11 (×11): via TOPICAL
  Filled 2017-07-05 (×3): qty 15
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
## 2017-07-05 NOTE — Progress Notes (Signed)
**  Preliminary report by tech**  Bilateral lower extremity venous duplex completed. There is no obvious evidence of deep or superficial vein thrombosis involving the right and left lower extremities. All clearly visualized vessels appear patent and compressible. There is no evidence of Baker's cysts bilaterally.  07/05/17 8:52 AM Carlos Levering RVT

## 2017-07-05 NOTE — Progress Notes (Signed)
Pt states she did not like wearing the hospitals CPAP machine and that she would rather not wear it tonight. Will continue to monitor.
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
## 2017-07-06 ENCOUNTER — Inpatient Hospital Stay (HOSPITAL_COMMUNITY): Payer: Medicare Other

## 2017-07-06 LAB — BASIC METABOLIC PANEL
Anion gap: 10 (ref 5–15)
BUN: 19 mg/dL (ref 6–20)
CO2: 40 mmol/L — ABNORMAL HIGH (ref 22–32)
Calcium: 7.9 mg/dL — ABNORMAL LOW (ref 8.9–10.3)
Chloride: 90 mmol/L — ABNORMAL LOW (ref 101–111)
Creatinine, Ser: 1.29 mg/dL — ABNORMAL HIGH (ref 0.44–1.00)
GFR calc Af Amer: 50 mL/min — ABNORMAL LOW (ref >60)
GFR calc non Af Amer: 43 mL/min — ABNORMAL LOW (ref >60)
Glucose, Bld: 145 mg/dL — ABNORMAL HIGH (ref 65–99)
Potassium: 3.7 mmol/L (ref 3.5–5.1)
Sodium: 140 mmol/L (ref 135–145)

## 2017-07-06 LAB — GLUCOSE, CAPILLARY
GLUCOSE-CAPILLARY: 116 mg/dL — AB (ref 65–99)
Glucose-Capillary: 141 mg/dL — ABNORMAL HIGH (ref 65–99)
Glucose-Capillary: 140 mg/dL — ABNORMAL HIGH (ref 65–99)
Glucose-Capillary: 130 mg/dL — ABNORMAL HIGH (ref 65–99)
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
## 2017-07-07 LAB — GLUCOSE, CAPILLARY
GLUCOSE-CAPILLARY: 129 mg/dL — AB (ref 65–99)
Glucose-Capillary: 138 mg/dL — ABNORMAL HIGH (ref 65–99)
Glucose-Capillary: 144 mg/dL — ABNORMAL HIGH (ref 65–99)
Glucose-Capillary: 162 mg/dL — ABNORMAL HIGH (ref 65–99)

## 2017-07-07 LAB — CBC WITH DIFFERENTIAL/PLATELET
BASOS PCT: 0 %
Basophils Absolute: 0 10*3/uL (ref 0.0–0.1)
EOS ABS: 0.3 10*3/uL (ref 0.0–0.7)
EOS PCT: 3 %
HCT: 34.8 % — ABNORMAL LOW (ref 36.0–46.0)
Hemoglobin: 10 g/dL — ABNORMAL LOW (ref 12.0–15.0)
Lymphocytes Relative: 14 %
Lymphs Abs: 1.3 10*3/uL (ref 0.7–4.0)
MCH: 23.5 pg — AB (ref 26.0–34.0)
MCHC: 28.7 g/dL — ABNORMAL LOW (ref 30.0–36.0)
MCV: 81.7 fL (ref 78.0–100.0)
MONO ABS: 0.7 10*3/uL (ref 0.1–1.0)
MONOS PCT: 8 %
NEUTROS PCT: 75 %
Neutro Abs: 7 10*3/uL (ref 1.7–7.7)
PLATELETS: 309 10*3/uL (ref 150–400)
RBC: 4.26 MIL/uL (ref 3.87–5.11)
RDW: 16.9 % — ABNORMAL HIGH (ref 11.5–15.5)
WBC: 9.4 10*3/uL (ref 4.0–10.5)

## 2017-07-07 LAB — CULTURE, BLOOD (ROUTINE X 2)
Culture: NO GROWTH
Culture: NO GROWTH
SPECIAL REQUESTS: ADEQUATE
Special Requests: ADEQUATE

## 2017-07-07 LAB — BASIC METABOLIC PANEL
Anion gap: 9 (ref 5–15)
BUN: 18 mg/dL (ref 6–20)
CALCIUM: 7.8 mg/dL — AB (ref 8.9–10.3)
CO2: 40 mmol/L — AB (ref 22–32)
CREATININE: 1 mg/dL (ref 0.44–1.00)
Chloride: 90 mmol/L — ABNORMAL LOW (ref 101–111)
GFR calc non Af Amer: 59 mL/min — ABNORMAL LOW (ref >60)
Glucose, Bld: 160 mg/dL — ABNORMAL HIGH (ref 65–99)
Potassium: 3.9 mmol/L (ref 3.5–5.1)
Sodium: 139 mmol/L (ref 135–145)

## 2017-07-07 MED ORDER — ENOXAPARIN SODIUM 40 MG/0.4ML ~~LOC~~ SOLN
40.0000 mg | SUBCUTANEOUS | Status: DC
  Administered 2017-07-07 – 2017-07-11 (×5): 40 mg via SUBCUTANEOUS
  Filled 2017-07-07 (×5): qty 0.4
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
## 2017-07-07 NOTE — Progress Notes (Signed)
PHARMACY NOTE -  Ancef dosing  Pharmacy has been assisting with dosing of Ancef for cellulitis. Dosage remains stable at 2gm IV q8h and need for further dosage adjustment appears unlikely at present.    Will sign off at this time.  Please reconsult if a change in clinical status warrants re-evaluation of dosage.  Netta Cedars, PharmD, BCPS 07/07/2017@11 :08 AM
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
## 2017-07-07 NOTE — Progress Notes (Signed)
Pt refused cpap tonight.  Pt stated that she has tried several of our masks and was unable to tolerate any of them.  Pt prefers to wear nasal cannula instead, currently at 3l pm.  Pt was advised that RT is available all night should she change her mind.

## 2017-07-08 LAB — BASIC METABOLIC PANEL
Anion gap: 8 (ref 5–15)
BUN: 18 mg/dL (ref 6–20)
CHLORIDE: 90 mmol/L — AB (ref 101–111)
CO2: 42 mmol/L — ABNORMAL HIGH (ref 22–32)
Calcium: 8 mg/dL — ABNORMAL LOW (ref 8.9–10.3)
Creatinine, Ser: 1.09 mg/dL — ABNORMAL HIGH (ref 0.44–1.00)
GFR calc Af Amer: >60 mL/min (ref >60)
GFR calc non Af Amer: 53 mL/min — ABNORMAL LOW (ref >60)
Glucose, Bld: 149 mg/dL — ABNORMAL HIGH (ref 65–99)
Potassium: 3.8 mmol/L (ref 3.5–5.1)
SODIUM: 140 mmol/L (ref 135–145)

## 2017-07-08 LAB — GLUCOSE, CAPILLARY
GLUCOSE-CAPILLARY: 112 mg/dL — AB (ref 65–99)
GLUCOSE-CAPILLARY: 150 mg/dL — AB (ref 65–99)
Glucose-Capillary: 124 mg/dL — ABNORMAL HIGH (ref 65–99)
Glucose-Capillary: 162 mg/dL — ABNORMAL HIGH (ref 65–99)

## 2017-07-08 NOTE — Evaluation (Signed)
Physical Therapy Evaluation Patient Details Name: [name] MRN: [id number] DOB: June 16, 1956 Today's Date: 07/08/2017   History of Present Illness  [name] is a 61 y.o. female with multiple medical cor morbidities including chronic hypercapnic respiratory failure, morbid obesity, diabetes, hypertension, GERD among other issues who presents to the hospital today with the above complaints. She was seen in the emergency department on 7/20 for UTI and discharged home on Keflex. The day after this visit she started noticing pain and redness to her left leg that has become progressively worse  Clinical Impression  Pt admitted with above diagnosis. Pt currently with functional limitations due to the deficits listed below (see PT Problem List).  Pt will benefit from skilled PT to increase their independence and safety with mobility to allow discharge to the venue listed below.   Pt unable to amb more than a few steps bed to [facility] and back to bed d/t c/o fatigue; will continue to follow in acute setting, see bel ow for details     Follow Up Recommendations Other (comment);Home health PT ([place], [place] aide)    Equipment Recommendations  None recommended by PT    Recommendations for Other Services       Precautions / Restrictions Precautions Precautions: Fall Restrictions Weight Bearing Restrictions: No      Mobility  Bed Mobility Overal bed mobility: Needs Assistance Bed Mobility: Supine to Sit;Sit to Supine     Supine to sit: Min assist Sit to supine: Min assist   General bed mobility comments: assist with LEs on and off bed   Transfers Overall transfer level: Needs assistance Equipment used: Rolling walker (2 wheeled) Transfers: Sit to/from [facility] Sit to Stand: Min assist Stand pivot transfers: Min assist       General transfer comment: cues for RW safety and hand placement  Ambulation/Gait Ambulation/Gait assistance: Min assist Ambulation  Distance (Feet): 6 Feet Assistive device: Rolling walker (2 wheeled) Gait Pattern/deviations: Step-to pattern;Wide base of support;Decreased step length - right;Decreased step length - left     General Gait Details: assist for balacne, pt c/o feeling "weak"  Stairs            Wheelchair Mobility    Modified Rankin (Stroke Patients Only)       Balance Overall balance assessment: Needs assistance         Standing balance support: Single extremity supported;No upper extremity supported Standing balance-Leahy Scale: Fair                               Pertinent Vitals/Pain Pain Assessment: 0-10 Pain Score: 3  Pain Location: LLE Pain Descriptors / Indicators: Tightness;Sore Pain Intervention(s): Limited activity within patient's tolerance;Monitored during session    [place] expects to be discharged to:: (P) Private residence Living Arrangements: (P)  ("mentally challenged" grandson lives with pt) Available Help at Discharge: Available 24 hours/day Type of Home: House Home Access: Ramped entrance   Entrance Stairs-Number of Steps: (P) 5 Home Layout: (P) One level Home Equipment: Walker - 2 wheels;Wheelchair - power      Prior Function Level of Independence: Independent with assistive device(s)   Gait / Transfers Assistance Needed: amb to bathroom with RW, although mostly transfers to [facility] for the last month           Hand Dominance        Extremity/Trunk Assessment   Upper Extremity Assessment Upper Extremity Assessment:  Overall [facility] for tasks assessed    Lower Extremity Assessment Lower Extremity Assessment: Generalized weakness;RLE deficits/detail RLE Deficits / Details: rapid  muscle fatigue        Communication   Communication: No difficulties  Cognition Arousal/Alertness: Awake/alert Behavior During Therapy: WFL for tasks assessed/performed Overall Cognitive Status: Within Functional Limits for tasks assessed                                         General Comments      Exercises     Assessment/Plan    PT Assessment Patient needs continued PT services  PT Problem List Decreased strength;Decreased mobility;Obesity;Pain;Decreased activity tolerance;Decreased balance       PT Treatment Interventions DME instruction;Gait training;Functional mobility training;Therapeutic activities;Patient/family education;Therapeutic exercise    PT Goals (Current goals can be found in the Care Plan section)  Acute Rehab PT Goals Patient Stated Goal: home and get some help until I am more IND PT Goal Formulation: With patient Time For Goal Achievement: 07/15/17 Potential to Achieve Goals: Good    Frequency Min 3X/week   Barriers to discharge        Co-evaluation               AM-PAC PT "6 Clicks" Daily Activity  Outcome Measure Difficulty turning over in bed (including adjusting bedclothes, sheets and blankets)?: Total Difficulty moving from lying on back to sitting on the side of the bed? : Total Difficulty sitting down on and standing up from a chair with arms (e.g., wheelchair, bedside commode, etc,.)?: Total Help needed moving to and from a bed to chair (including a wheelchair)?: A Little Help needed walking in hospital room?: A Little Help needed climbing 3-5 steps with a railing? : A Lot 6 Click Score: 11    End of Session Equipment Utilized During Treatment: Gait belt Activity Tolerance: Patient tolerated treatment well;Patient limited by fatigue Patient left: in bed;with call bell/phone within reach   PT Visit Diagnosis: Difficulty in walking, not elsewhere classified (R26.2)    Time:  -      Charges:         PT G CodesKenyon Ana, PT Pager: [phone] 07/08/2017   [facility] 07/08/2017, 2:50 PM

## 2017-07-08 NOTE — Progress Notes (Signed)
Pt continues to refuse cpap.  Pt prefers to wear nasal cannula instead.  Pt was encouraged to call should she change her mind.
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
## 2017-07-09 LAB — GLUCOSE, CAPILLARY
GLUCOSE-CAPILLARY: 162 mg/dL — AB (ref 65–99)
GLUCOSE-CAPILLARY: 153 mg/dL — AB (ref 65–99)
Glucose-Capillary: 117 mg/dL — ABNORMAL HIGH (ref 65–99)
Glucose-Capillary: 135 mg/dL — ABNORMAL HIGH (ref 65–99)

## 2017-07-09 MED ORDER — ALUM & MAG HYDROXIDE-SIMETH 200-200-20 MG/5ML PO SUSP
30.0000 mL | Freq: Four times a day (QID) | ORAL | Status: DC | PRN
  Administered 2017-07-09: 30 mL via ORAL
  Filled 2017-07-09: qty 30

## 2017-07-09 MED ORDER — CLINDAMYCIN PHOSPHATE 600 MG/50ML IV SOLN
600.0000 mg | Freq: Three times a day (TID) | INTRAVENOUS | Status: DC
  Administered 2017-07-09 – 2017-07-10 (×4): 600 mg via INTRAVENOUS
  Filled 2017-07-09 (×4): qty 50

## 2017-07-09 MED ORDER — TETANUS-DIPHTH-ACELL PERTUSSIS 5-2.5-18.5 LF-MCG/0.5 IM SUSP: 0.5000 mL | Freq: Once | INTRAMUSCULAR | Status: DC

## 2017-07-09 MED ORDER — RISAQUAD PO CAPS
1.0000 | ORAL_CAPSULE | Freq: Every day | ORAL | Status: DC
  Administered 2017-07-09 – 2017-07-11 (×3): 1 via ORAL
  Filled 2017-07-09 (×3): qty 1
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
## 2017-07-09 NOTE — Progress Notes (Signed)
Pt refuses cpap, prefers to wear nasal cannula.  Pt was encouraged to call should she change her mind.

## 2017-07-09 NOTE — Care Management Important Message (Signed)
Important Message  Patient Details  Name: [name] MRN: [id number] Date of Birth: [birth year]   Medicare Important Message Given:  Yes    Kerin Salen 07/09/2017, 10:42 AMImportant Message  Patient Details  Name: [name] MRN: [id number] Date of Birth: 25-Sep-1956   Medicare Important Message Given:  Yes    Kerin Salen 07/09/2017, 10:42 AM

## 2017-07-09 NOTE — Care Management Note (Signed)
Case Management Note  Patient Details  Name: [name] MRN: [id number] Date of Birth: 1956-01-05  Subjective/Objective:       ssc             Action/Plan: Date:  July 09 2017  Chart reviewed for concurrent status and case management needs.  Will continue to follow patient progress.  Discharge Planning: following for needs  Expected discharge date: [id number]  Velva Harman, BSN, [place], [place]   Expected Discharge Date:   (unknown)               Expected Discharge Plan:  [place]  In-House Referral:     Discharge planning Services  CM Consult  Post Acute Care Choice:    Choice offered to:     DME Arranged:    DME Agency:     HH Arranged:    [place] Agency:     Status of Service:  In process, will continue to follow  If discussed at Long Length of Stay Meetings, dates discussed:    Additional Comments:  Leeroy Cha, RN 07/09/2017, 9:20 AM

## 2017-07-10 LAB — BASIC METABOLIC PANEL
Anion gap: 10 (ref 5–15)
BUN: 17 mg/dL (ref 6–20)
CHLORIDE: 87 mmol/L — AB (ref 101–111)
CO2: 43 mmol/L — ABNORMAL HIGH (ref 22–32)
Calcium: 8.4 mg/dL — ABNORMAL LOW (ref 8.9–10.3)
Creatinine, Ser: 1.2 mg/dL — ABNORMAL HIGH (ref 0.44–1.00)
GFR calc Af Amer: 55 mL/min — ABNORMAL LOW (ref >60)
GFR calc non Af Amer: 47 mL/min — ABNORMAL LOW (ref >60)
Glucose, Bld: 191 mg/dL — ABNORMAL HIGH (ref 65–99)
Potassium: 3.9 mmol/L (ref 3.5–5.1)
SODIUM: 140 mmol/L (ref 135–145)

## 2017-07-10 LAB — GLUCOSE, CAPILLARY
GLUCOSE-CAPILLARY: 110 mg/dL — AB (ref 65–99)
GLUCOSE-CAPILLARY: 136 mg/dL — AB (ref 65–99)
Glucose-Capillary: 197 mg/dL — ABNORMAL HIGH (ref 65–99)
Glucose-Capillary: 112 mg/dL — ABNORMAL HIGH (ref 65–99)

## 2017-07-10 MED ORDER — CLINDAMYCIN HCL 300 MG PO CAPS
300.0000 mg | ORAL_CAPSULE | Freq: Three times a day (TID) | ORAL | Status: DC
  Administered 2017-07-10 – 2017-07-11 (×3): 300 mg via ORAL
  Filled 2017-07-10 (×4): qty 1

## 2017-07-10 NOTE — Progress Notes (Signed)
Pt from home with daughter. PT recommendations gone over with patient. Patient offered choice for home health services and chose [facility] as she has used them before and they provide her home 65.  Pt also requesting Bariatric 3in1 and order received. [facility] rep contacted for referral and DME need. CM will continue to follow. Marney Doctor RN,BSN,NCM [phone]
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
## 2017-07-10 NOTE — Progress Notes (Signed)
Pt has declined use of CPAP tonight.
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
## 2017-07-11 DIAGNOSIS — L03116 Cellulitis of left lower limb: Principal | ICD-10-CM

## 2017-07-11 LAB — GLUCOSE, CAPILLARY
GLUCOSE-CAPILLARY: 129 mg/dL — AB (ref 65–99)
Glucose-Capillary: 147 mg/dL — ABNORMAL HIGH (ref 65–99)
Glucose-Capillary: 157 mg/dL — ABNORMAL HIGH (ref 65–99)

## 2017-07-11 LAB — BASIC METABOLIC PANEL
ANION GAP: 9 (ref 5–15)
BUN: 18 mg/dL (ref 6–20)
CALCIUM: 8.5 mg/dL — AB (ref 8.9–10.3)
CO2: 42 mmol/L — ABNORMAL HIGH (ref 22–32)
CREATININE: 1.05 mg/dL — AB (ref 0.44–1.00)
Chloride: 88 mmol/L — ABNORMAL LOW (ref 101–111)
GFR, EST NON AFRICAN AMERICAN: 55 mL/min — AB (ref >60)
Glucose, Bld: 154 mg/dL — ABNORMAL HIGH (ref 65–99)
Potassium: 4.1 mmol/L (ref 3.5–5.1)
Sodium: 139 mmol/L (ref 135–145)

## 2017-07-11 MED ORDER — FLUCONAZOLE 200 MG PO TABS: 200.0000 mg | ORAL_TABLET | ORAL | 0 refills | Status: AC

## 2017-07-11 MED ORDER — FUROSEMIDE 40 MG PO TABS: 80.0000 mg | ORAL_TABLET | Freq: Every day | ORAL | 0 refills | Status: DC

## 2017-07-11 MED ORDER — CLINDAMYCIN HCL 300 MG PO CAPS: 300.0000 mg | ORAL_CAPSULE | Freq: Four times a day (QID) | ORAL | 0 refills | Status: AC
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
## 2017-07-11 NOTE — Progress Notes (Signed)
Patient discharged with grandson and with home oxygen at 3 liters via nasal cannula, in no acute distress. Home meds returned to patient.

## 2017-07-11 NOTE — Progress Notes (Signed)
Pt attempting to find ride for discharge. Ambulance transport forms filled out and printed in case pt unable to find ride home. Pt understands that [facility] will not transport 3in1 home. Marney Doctor RN,BSN,NCM [phone]

## 2017-07-11 NOTE — Progress Notes (Signed)
Patient  Verbalized discharge instructions given , still trying to find a ride home,receipt for home meds in pharmacy with patient.

## 2017-07-12 ENCOUNTER — Telehealth: Payer: Self-pay | Admitting: Pulmonary Disease

## 2017-07-12 NOTE — Telephone Encounter (Signed)
Spoke with Almyra Free at [facility] given. Nothing further needed.

## 2017-07-31 DIAGNOSIS — E119 Type 2 diabetes mellitus without complications: Secondary | ICD-10-CM | POA: Diagnosis not present

## 2017-07-31 DIAGNOSIS — I2510 Atherosclerotic heart disease of native coronary artery without angina pectoris: Secondary | ICD-10-CM | POA: Diagnosis not present

## 2017-07-31 DIAGNOSIS — I482 Chronic atrial fibrillation: Secondary | ICD-10-CM | POA: Diagnosis not present

## 2017-07-31 DIAGNOSIS — H1013 Acute atopic conjunctivitis, bilateral: Secondary | ICD-10-CM | POA: Diagnosis not present

## 2017-07-31 DIAGNOSIS — I5032 Chronic diastolic (congestive) heart failure: Secondary | ICD-10-CM | POA: Diagnosis not present

## 2017-08-25 ENCOUNTER — Encounter (HOSPITAL_COMMUNITY): Payer: Self-pay

## 2017-08-25 ENCOUNTER — Emergency Department (HOSPITAL_COMMUNITY): Payer: Medicare Other

## 2017-08-25 ENCOUNTER — Inpatient Hospital Stay (HOSPITAL_COMMUNITY)
Admission: EM | Admit: 2017-08-25 | Discharge: 2017-08-30 | DRG: 291 | Disposition: A | Discharge status: Skilled Nursing Facility | Payer: Medicare Other | Attending: Internal Medicine | Admitting: Internal Medicine

## 2017-08-25 DIAGNOSIS — J441 Chronic obstructive pulmonary disease with (acute) exacerbation: Secondary | ICD-10-CM | POA: Diagnosis not present

## 2017-08-25 DIAGNOSIS — J9622 Acute and chronic respiratory failure with hypercapnia: Secondary | ICD-10-CM | POA: Diagnosis present

## 2017-08-25 DIAGNOSIS — G9340 Encephalopathy, unspecified: Secondary | ICD-10-CM | POA: Diagnosis present

## 2017-08-25 DIAGNOSIS — I5042 Chronic combined systolic (congestive) and diastolic (congestive) heart failure: Secondary | ICD-10-CM

## 2017-08-25 DIAGNOSIS — E119 Type 2 diabetes mellitus without complications: Secondary | ICD-10-CM

## 2017-08-25 DIAGNOSIS — J9601 Acute respiratory failure with hypoxia: Secondary | ICD-10-CM | POA: Diagnosis not present

## 2017-08-25 DIAGNOSIS — J9602 Acute respiratory failure with hypercapnia: Secondary | ICD-10-CM | POA: Diagnosis not present

## 2017-08-25 DIAGNOSIS — D509 Iron deficiency anemia, unspecified: Secondary | ICD-10-CM | POA: Diagnosis present

## 2017-08-25 DIAGNOSIS — K219 Gastro-esophageal reflux disease without esophagitis: Secondary | ICD-10-CM | POA: Diagnosis present

## 2017-08-25 DIAGNOSIS — I509 Heart failure, unspecified: Secondary | ICD-10-CM

## 2017-08-25 DIAGNOSIS — B348 Other viral infections of unspecified site: Secondary | ICD-10-CM

## 2017-08-25 DIAGNOSIS — Z8542 Personal history of malignant neoplasm of other parts of uterus: Secondary | ICD-10-CM

## 2017-08-25 DIAGNOSIS — I110 Hypertensive heart disease with heart failure: Principal | ICD-10-CM | POA: Diagnosis present

## 2017-08-25 DIAGNOSIS — I5043 Acute on chronic combined systolic (congestive) and diastolic (congestive) heart failure: Secondary | ICD-10-CM | POA: Diagnosis present

## 2017-08-25 DIAGNOSIS — Z9981 Dependence on supplemental oxygen: Secondary | ICD-10-CM

## 2017-08-25 DIAGNOSIS — Z90710 Acquired absence of both cervix and uterus: Secondary | ICD-10-CM

## 2017-08-25 DIAGNOSIS — E872 Acidosis: Secondary | ICD-10-CM | POA: Diagnosis present

## 2017-08-25 DIAGNOSIS — I10 Essential (primary) hypertension: Secondary | ICD-10-CM | POA: Diagnosis present

## 2017-08-25 DIAGNOSIS — E876 Hypokalemia: Secondary | ICD-10-CM | POA: Diagnosis present

## 2017-08-25 DIAGNOSIS — I4891 Unspecified atrial fibrillation: Secondary | ICD-10-CM | POA: Diagnosis present

## 2017-08-25 DIAGNOSIS — J9621 Acute and chronic respiratory failure with hypoxia: Secondary | ICD-10-CM | POA: Diagnosis not present

## 2017-08-25 DIAGNOSIS — E662 Morbid (severe) obesity with alveolar hypoventilation: Secondary | ICD-10-CM | POA: Diagnosis present

## 2017-08-25 DIAGNOSIS — Z87891 Personal history of nicotine dependence: Secondary | ICD-10-CM

## 2017-08-25 DIAGNOSIS — Z881 Allergy status to other antibiotic agents status: Secondary | ICD-10-CM

## 2017-08-25 DIAGNOSIS — R069 Unspecified abnormalities of breathing: Secondary | ICD-10-CM | POA: Diagnosis not present

## 2017-08-25 DIAGNOSIS — F329 Major depressive disorder, single episode, unspecified: Secondary | ICD-10-CM | POA: Diagnosis present

## 2017-08-25 DIAGNOSIS — Z9114 Patient's other noncompliance with medication regimen: Secondary | ICD-10-CM

## 2017-08-25 DIAGNOSIS — Z886 Allergy status to analgesic agent status: Secondary | ICD-10-CM

## 2017-08-25 DIAGNOSIS — Z6841 Body Mass Index (BMI) 40.0 and over, adult: Secondary | ICD-10-CM | POA: Diagnosis not present

## 2017-08-25 DIAGNOSIS — E1142 Type 2 diabetes mellitus with diabetic polyneuropathy: Secondary | ICD-10-CM | POA: Diagnosis present

## 2017-08-25 DIAGNOSIS — Z9119 Patient's noncompliance with other medical treatment and regimen: Secondary | ICD-10-CM

## 2017-08-25 DIAGNOSIS — I482 Chronic atrial fibrillation: Secondary | ICD-10-CM | POA: Diagnosis present

## 2017-08-25 DIAGNOSIS — Z91199 Patient's noncompliance with other medical treatment and regimen due to unspecified reason: Secondary | ICD-10-CM

## 2017-08-25 DIAGNOSIS — Z888 Allergy status to other drugs, medicaments and biological substances status: Secondary | ICD-10-CM

## 2017-08-25 DIAGNOSIS — Z882 Allergy status to sulfonamides status: Secondary | ICD-10-CM

## 2017-08-25 DIAGNOSIS — R0689 Other abnormalities of breathing: Secondary | ICD-10-CM

## 2017-08-25 DIAGNOSIS — R0602 Shortness of breath: Secondary | ICD-10-CM | POA: Diagnosis not present

## 2017-08-25 DIAGNOSIS — J9611 Chronic respiratory failure with hypoxia: Secondary | ICD-10-CM | POA: Diagnosis present

## 2017-08-25 DIAGNOSIS — G894 Chronic pain syndrome: Secondary | ICD-10-CM | POA: Diagnosis present

## 2017-08-25 DIAGNOSIS — I2510 Atherosclerotic heart disease of native coronary artery without angina pectoris: Secondary | ICD-10-CM | POA: Diagnosis present

## 2017-08-25 DIAGNOSIS — Z79899 Other long term (current) drug therapy: Secondary | ICD-10-CM

## 2017-08-25 DIAGNOSIS — E039 Hypothyroidism, unspecified: Secondary | ICD-10-CM | POA: Diagnosis present

## 2017-08-25 LAB — BLOOD GAS, ARTERIAL
Acid-Base Excess: 10.4 mmol/L — ABNORMAL HIGH (ref 0.0–2.0)
BICARBONATE: 38.4 mmol/L — AB (ref 20.0–28.0)
Drawn by: 362771
O2 CONTENT: 4 L/min
O2 SAT: 87.7 %
PCO2 ART: 101 mmHg — AB (ref 32.0–48.0)
PO2 ART: 61.6 mmHg — AB (ref 83.0–108.0)
Patient temperature: 98.6
pH, Arterial: 7.205 — ABNORMAL LOW (ref 7.350–7.450)

## 2017-08-25 LAB — CBC WITH DIFFERENTIAL/PLATELET
BASOS PCT: 0 %
Basophils Absolute: 0 10*3/uL (ref 0.0–0.1)
EOS ABS: 0 10*3/uL (ref 0.0–0.7)
Eosinophils Relative: 0 %
HEMATOCRIT: 37.1 % (ref 36.0–46.0)
HEMOGLOBIN: 10 g/dL — AB (ref 12.0–15.0)
LYMPHS ABS: 0.6 10*3/uL — AB (ref 0.7–4.0)
LYMPHS PCT: 6 %
MCH: 22.5 pg — AB (ref 26.0–34.0)
MCHC: 27 g/dL — AB (ref 30.0–36.0)
MCV: 83.6 fL (ref 78.0–100.0)
MONOS PCT: 2 %
Monocytes Absolute: 0.2 10*3/uL (ref 0.1–1.0)
NEUTROS ABS: 8.6 10*3/uL — AB (ref 1.7–7.7)
Neutrophils Relative %: 92 %
Platelets: 237 10*3/uL (ref 150–400)
RBC: 4.44 MIL/uL (ref 3.87–5.11)
RDW: 17.5 % — ABNORMAL HIGH (ref 11.5–15.5)
WBC: 9.4 10*3/uL (ref 4.0–10.5)

## 2017-08-25 NOTE — ED Triage Notes (Addendum)
Pt brought in by [facility] from home, family called stating pt has been sick x1 week. When EMS arrived pt O2 was 85% on 4L. Patient received x2 duonebs and 125 solumedrol en route to hospital. Lung sounds diminished bilaterally. Pt has hx of CHF and Afib. Pt O2 98% on 8L. Pt is currently SOB but denies any other sx.  Respiratory made aware. Pt has hx of tremors but states they are worse at the moment.

## 2017-08-25 NOTE — ED Notes (Signed)
Radiology at bedside

## 2017-08-25 NOTE — ED Provider Notes (Signed)
[facility] DEPT Provider Note   CSN: [id number] Arrival date & time: 08/25/17  2118     History   Chief Complaint Chief Complaint  Patient presents with  . Shortness of Breath    HPI [name] is a 61 y.o. female.  The patient presents for evaluation of cough with congestion and personal concern for pneumonia.  She uses oxygen chronically, at 4 L/min by her report.  Today at home EMS found her to be 85% on 4 L.  She was transported here and treated with DuoNeb and Solu-Medrol.  On arrival patient was alert, and receiving a nebulizer.  She has had productive cough, with yellow sputum.  She states she does not smoke.  She states that she is taking all of her medicines as usual.  She has been ill for several days.  She has decreased appetite, but no vomiting, diarrhea, focal weakness or paresthesia.  She uses CPAP at night.  There are no other known modifying factors.   HPI  Past Medical History:  Diagnosis Date  . Acute and chronic respiratory failure with hypercapnia ([place])   . ADD (attention deficit disorder)   . Altered mental status    2nd to hypercapnea  . CHF (congestive heart failure) ([place])   . Chronic low back pain   . Complication of anesthesia    " I AM SLOW MTO WAKE UP AT ITMES "  . Coronary artery disease   . Depression   . Diabetes mellitus   . Endometrial cancer ([place])   . GERD (gastroesophageal reflux disease)   . Hypercapnia   . Hypertension   . Hypothyroidism   . Hypoxemia   . Morbid obesity ([place])   . Nephrolithiasis   . OSA (obstructive sleep apnea)   . Paroxysmal atrial fibrillation (HCC)   . Restless leg syndrome     Patient Active Problem List   Diagnosis Date Noted  . Pressure injury of skin 07/03/2017  . Cellulitis of leg, left 07/02/2017  . Obesity, Class III, BMI 40-49.9 (morbid obesity) ([place]) 07/02/2017  . Acute on chronic left systolic heart failure ([place]) 02/10/2016  . Acute respiratory failure with hypoxia and hypercapnia (HCC)    . Seizures ([place])   . Aspiration pneumonia ([place])   . Sepsis ([place])   . Acute on chronic respiratory failure ([place]) 12/25/2015  . Pneumonia 11/09/2014  . CAP (community acquired pneumonia) 10/08/2014  . Paroxysmal atrial fibrillation ([place]) 04/11/2014  . Narcotic overdose 07/07/2013  . Hypoxia 07/07/2013  . Diabetes mellitus ([place]) 07/07/2013  . HTN (hypertension) 07/07/2013  . Chronic pain 07/07/2013  . Hypercapnic respiratory failure, chronic ([place]) 10/17/2011  . OSA (obstructive sleep apnea) 12/23/2007    Past Surgical History:  Procedure Laterality Date  . BACK SURGERY     x2  . DENTAL SURGERY    . SHOULDER SURGERY     x2  . SP PERC NEPHROSTOMY    . TONSILLECTOMY    . VAGINAL HYSTERECTOMY     x2    OB History    No data available       Home Medications    Prior to Admission medications   Medication Sig Start Date End Date Taking? Authorizing Provider  buprenorphine (SUBUTEX) 8 MG SUBL SL tablet Place 8 mg under the tongue 2 (two) times daily.  06/18/17   [provider]  cetirizine (ZYRTEC) 10 MG tablet Take 10 mg by mouth at bedtime.    [provider]  clotrimazole-betamethasone (LOTRISONE) cream  Apply 1 application topically daily as needed (antifungal).     [provider]  diltiazem (DILACOR XR) 240 MG 24 hr capsule Take 1 capsule (240 mg total) by mouth daily. 11/11/14   Dixie Dials, MD  furosemide (LASIX) 40 MG tablet Take 2 tablets (80 mg total) by mouth daily. 07/11/17   Dessa Phi Chahn-Yang, DO  gabapentin (NEURONTIN) 100 MG capsule Take 100 mg by mouth at bedtime. 06/18/17   [provider]  Metoprolol Tartrate 75 MG TABS Take 75 mg by mouth 2 (two) times daily. 01/02/16   Hosie Poisson, MD  prochlorperazine (COMPAZINE) 10 MG tablet Take 10 mg by mouth every 6 (six) hours as needed for nausea or vomiting.    [provider]  ranitidine (ZANTAC) 150 MG tablet Take 150 mg by mouth 2 (two) times daily.    [provider]  sertraline (ZOLOFT) 100 MG tablet Take 200 mg by mouth every morning.     [provider]  SYNTHROID 75 MCG tablet Take 75 mcg by mouth daily before breakfast.  06/27/17   [provider]    Family History Family History  Problem Relation Age of Onset  . Cancer Father     Social History Social History  Substance Use Topics  . Smoking status: Former Smoker    Packs/day: 0.50    Years: 2.00    Types: Cigarettes    Quit date: 09/10/1974  . Smokeless tobacco: Never Used     Comment: quit in teenage years  . Alcohol use No     Allergies   Aspirin; Eliquis [apixaban]; Nsaids; Other; Plavix [clopidogrel]; Sudafed [pseudoephedrine]; Erythromycin; Sulfa antibiotics; Sulfamethoxazole-trimethoprim; Altace [ramipril]; and Zofran [ondansetron hcl]   Review of Systems Review of Systems  All other systems reviewed and are negative.    Physical Exam Updated Vital Signs BP 136/86   Pulse 81   Resp 16   Ht 5\' 5"  (1.651 m)   Wt 136.1 kg (300 lb)   SpO2 92%   BMI 49.92 kg/m   Physical Exam  Constitutional: She is oriented to person, place, and time. She appears well-developed. No distress.  Obese, appears older than stated age  HENT:  Head: Normocephalic and atraumatic.  Eyes: Pupils are equal, round, and reactive to light. Conjunctivae and EOM are normal.  Neck: Normal range of motion and phonation normal. Neck supple.  Cardiovascular: Normal rate and regular rhythm.   Pulmonary/Chest: Effort normal and breath sounds normal. She exhibits no tenderness.  Abdominal: Soft. She exhibits no distension. There is no tenderness. There is no guarding.  Musculoskeletal: Normal range of motion.  Neurological: She is alert and oriented to person, place, and time. No cranial nerve deficit. She exhibits normal muscle tone.  Somewhat lethargic, but arousable when she drifts off to sleep.  Upon awakening she is alert, conversant, and does not fall back asleep,  while talking.  Skin: Skin is warm and dry. No erythema.  Psychiatric: She has a normal mood and affect. Her behavior is normal.  Nursing note and vitals reviewed.    ED Treatments / Results  Labs (all labs ordered are listed, but only abnormal results are displayed) Labs Reviewed  BLOOD GAS, ARTERIAL - Abnormal; Notable for the following:       Result Value   pH, Arterial 7.205 (*)    pCO2 arterial 101 (*)    pO2, Arterial 61.6 (*)    Bicarbonate 38.4 (*)    Acid-Base Excess 10.4 (*)  All other components within normal limits  CBC WITH DIFFERENTIAL/PLATELET - Abnormal; Notable for the following:    Hemoglobin 10.0 (*)    MCH 22.5 (*)    MCHC 27.0 (*)    RDW 17.5 (*)    Neutro Abs 8.6 (*)    Lymphs Abs 0.6 (*)    All other components within normal limits  CULTURE, BLOOD (ROUTINE X 2)  CULTURE, BLOOD (ROUTINE X 2)  URINALYSIS, ROUTINE W REFLEX MICROSCOPIC  BRAIN NATRIURETIC PEPTIDE  TROPONIN I  I-STAT TROPONIN, ED  I-STAT CHEM 8, ED  I-STAT CG4 LACTIC ACID, ED    EKG  EKG Interpretation  Date/Time:  Saturday August 25 2017 21:34:51 EDT Ventricular Rate:  94 PR Interval:    QRS Duration: 100 QT Interval:  418 QTC Calculation: 470 R Axis:   0 Text Interpretation:  Atrial fibrillation Paired ventricular premature complexes Indeterminate axis Low voltage, extremity and precordial leads Borderline repolarization abnormality Minimal ST elevation, anterolateral leads Baseline wander in lead(s) I II aVR since last tracing no significant change Confirmed by Daleen Bo [phone]) on 08/25/2017 10:44:02 PM      CHA2DS2/VAS Stroke Risk Points      4 >= 2 Points: High Risk  1 - 1.99 Points: Medium Risk  0 Points: Low Risk    This is the only CHA2DS2/VAS Stroke Risk Points available for the past  year.:  Change: N/A         Details    Note: External data might be a factor in metrics not marked with    Points Metrics   This score determines the patient's risk of  having a stroke if the  patient has atrial fibrillation.       1 Has Congestive Heart Failure:  Yes   0 Has Vascular Disease:  No   1 Has Hypertension:  Yes   0 Age:  33   1 Has Diabetes:  Yes   0 Had Stroke:  No Had TIA:  No Had thromboembolism:  No   1 Female:  Yes          Radiology Dg Chest Port 1 View  Result Date: 08/25/2017 CLINICAL DATA:  Shortness of breath tonight. EXAM: PORTABLE CHEST 1 VIEW COMPARISON:  02/10/2016 FINDINGS: Low lung volumes persist. The heart is enlarged. Increased ill-defined peribronchial opacities throughout both lungs suspicious for pulmonary edema. Linear opacity in the right mid lung may be external artifact, atelectasis, or fluid in the fissure. No pneumothorax. Soft tissue attenuation from body habitus limits assessment. IMPRESSION: Low lung volumes cardiomegaly and ill-defined peribronchial opacities suspicious for pulmonary edema. Overall findings suggest CHF. Electronically Signed   By: Jeb Levering M.D.   On: 08/25/2017 23:20    Procedures Procedures (including critical care time)  Medications Ordered in ED Medications - No data to display   Initial Impression / Assessment and Plan / ED Course  I have reviewed the triage vital signs and the nursing notes.  Pertinent labs & imaging results that were available during my care of the patient were reviewed by me and considered in my medical decision making (see chart for details).  Clinical Course as of Aug 26 5  Sun Aug 26, 2017  0004 Low pH, Arterial: (!) 7.205 [EW]  0004 High pCO2 arterial: (!!) 101 [EW]  0004 Low pO2, Arterial: (!) 61.6 [EW]  0004 Low Hemoglobin: (!) 10.0 [EW]    Clinical Course User Index [EW] Daleen Bo, MD     Patient Vitals for  the past 24 hrs:  BP Pulse Resp SpO2 Height Weight  08/25/17 2323 136/86 - - - - -  08/25/17 2300 136/86 81 16 92 % - -  08/25/17 2200 140/81 82 12 95 % - -  08/25/17 2130 (!) 151/85 - 12 (!) 89 % - -  08/25/17 2126 - - -  - 5\' 5"  (1.651 m) 136.1 kg (300 lb)  08/25/17 2119 - - - 98 % - -   00: 3 0-case discussed in depth with intensivist, Dr. Jimmy Footman.  She recommends keeping the patient on BiPAP for now, admit to her physician service, and she will remain available for consultation as needed.  00: 4 5-case discussed with Dr. Terrence Dupont, he is covering for Dr. Doylene Canard.  He requests that the patient be admitted by triad hospitalist, and he will see the patient in the morning.  01: 05-case discussed with hospitalist, Dr. Tamala Julian who will admit the patient.  1:11 AM Reevaluation with update and discussion. After initial assessment and treatment, an updated evaluation reveals patient comfortable on BiPAP, arousable to voice. Bralyn Folkert L   CRITICAL CARE Performed by: Daleen Bo L Total critical care time: 45 minutes Critical care time was exclusive of separately billable procedures and treating other patients. Critical care was necessary to treat or prevent imminent or life-threatening deterioration. Critical care was time spent personally by me on the following activities: development of treatment plan with patient and/or surrogate as well as nursing, discussions with consultants, evaluation of patient's response to treatment, examination of patient, obtaining history from patient or surrogate, ordering and performing treatments and interventions, ordering and review of laboratory studies, ordering and review of radiographic studies, pulse oximetry and re-evaluation of patient's condition.   Final Clinical Impressions(s) / ED Diagnoses   Final diagnoses:  Acute hypoxemic respiratory failure (HCC)  Hypercapnia  Congestive heart failure, unspecified HF chronicity, unspecified heart failure type (HCC)    Hypercapnic respiratory failure, without clear evidence for pneumonia.  Patient has recurrent similar respiratory distress.  Cardiac echo done about 18 months ago indicates slightly reduced systolic ejection  fraction at 45-50%.   Nursing Notes Reviewed/ Care Coordinated Applicable Imaging Reviewed Interpretation of Laboratory Data incorporated into ED treatment   Plan: Admit  New Prescriptions New Prescriptions   No medications on file     Daleen Bo, MD 08/26/17 [phone]

## 2017-08-25 NOTE — ED Notes (Signed)
Registration at bedside.

## 2017-08-26 ENCOUNTER — Encounter (HOSPITAL_COMMUNITY): Payer: Self-pay | Admitting: *Deleted

## 2017-08-26 DIAGNOSIS — D509 Iron deficiency anemia, unspecified: Secondary | ICD-10-CM

## 2017-08-26 DIAGNOSIS — G9340 Encephalopathy, unspecified: Secondary | ICD-10-CM | POA: Diagnosis not present

## 2017-08-26 DIAGNOSIS — R0602 Shortness of breath: Secondary | ICD-10-CM | POA: Diagnosis not present

## 2017-08-26 DIAGNOSIS — E872 Acidosis: Secondary | ICD-10-CM | POA: Diagnosis present

## 2017-08-26 DIAGNOSIS — J9601 Acute respiratory failure with hypoxia: Secondary | ICD-10-CM | POA: Diagnosis present

## 2017-08-26 DIAGNOSIS — G894 Chronic pain syndrome: Secondary | ICD-10-CM | POA: Diagnosis not present

## 2017-08-26 DIAGNOSIS — B348 Other viral infections of unspecified site: Secondary | ICD-10-CM | POA: Diagnosis not present

## 2017-08-26 DIAGNOSIS — J9611 Chronic respiratory failure with hypoxia: Secondary | ICD-10-CM | POA: Diagnosis present

## 2017-08-26 DIAGNOSIS — J9622 Acute and chronic respiratory failure with hypercapnia: Secondary | ICD-10-CM | POA: Diagnosis not present

## 2017-08-26 DIAGNOSIS — E876 Hypokalemia: Secondary | ICD-10-CM | POA: Diagnosis present

## 2017-08-26 DIAGNOSIS — J80 Acute respiratory distress syndrome: Secondary | ICD-10-CM | POA: Diagnosis not present

## 2017-08-26 DIAGNOSIS — Z882 Allergy status to sulfonamides status: Secondary | ICD-10-CM | POA: Diagnosis not present

## 2017-08-26 DIAGNOSIS — I110 Hypertensive heart disease with heart failure: Secondary | ICD-10-CM | POA: Diagnosis present

## 2017-08-26 DIAGNOSIS — Z9114 Patient's other noncompliance with medication regimen: Secondary | ICD-10-CM | POA: Diagnosis not present

## 2017-08-26 DIAGNOSIS — E119 Type 2 diabetes mellitus without complications: Secondary | ICD-10-CM | POA: Diagnosis not present

## 2017-08-26 DIAGNOSIS — J810 Acute pulmonary edema: Secondary | ICD-10-CM | POA: Diagnosis not present

## 2017-08-26 DIAGNOSIS — E1140 Type 2 diabetes mellitus with diabetic neuropathy, unspecified: Secondary | ICD-10-CM | POA: Diagnosis not present

## 2017-08-26 DIAGNOSIS — Z881 Allergy status to other antibiotic agents status: Secondary | ICD-10-CM | POA: Diagnosis not present

## 2017-08-26 DIAGNOSIS — E1142 Type 2 diabetes mellitus with diabetic polyneuropathy: Secondary | ICD-10-CM | POA: Diagnosis not present

## 2017-08-26 DIAGNOSIS — I10 Essential (primary) hypertension: Secondary | ICD-10-CM | POA: Diagnosis not present

## 2017-08-26 DIAGNOSIS — M6281 Muscle weakness (generalized): Secondary | ICD-10-CM | POA: Diagnosis not present

## 2017-08-26 DIAGNOSIS — Z886 Allergy status to analgesic agent status: Secondary | ICD-10-CM | POA: Diagnosis not present

## 2017-08-26 DIAGNOSIS — I5043 Acute on chronic combined systolic (congestive) and diastolic (congestive) heart failure: Secondary | ICD-10-CM | POA: Diagnosis not present

## 2017-08-26 DIAGNOSIS — Z6841 Body Mass Index (BMI) 40.0 and over, adult: Secondary | ICD-10-CM | POA: Diagnosis not present

## 2017-08-26 DIAGNOSIS — K219 Gastro-esophageal reflux disease without esophagitis: Secondary | ICD-10-CM | POA: Diagnosis present

## 2017-08-26 DIAGNOSIS — E662 Morbid (severe) obesity with alveolar hypoventilation: Secondary | ICD-10-CM | POA: Diagnosis not present

## 2017-08-26 DIAGNOSIS — Z9981 Dependence on supplemental oxygen: Secondary | ICD-10-CM | POA: Diagnosis not present

## 2017-08-26 DIAGNOSIS — I5041 Acute combined systolic (congestive) and diastolic (congestive) heart failure: Secondary | ICD-10-CM | POA: Diagnosis not present

## 2017-08-26 DIAGNOSIS — J9621 Acute and chronic respiratory failure with hypoxia: Secondary | ICD-10-CM | POA: Diagnosis not present

## 2017-08-26 DIAGNOSIS — R262 Difficulty in walking, not elsewhere classified: Secondary | ICD-10-CM | POA: Diagnosis not present

## 2017-08-26 DIAGNOSIS — R1311 Dysphagia, oral phase: Secondary | ICD-10-CM | POA: Diagnosis not present

## 2017-08-26 DIAGNOSIS — I5042 Chronic combined systolic (congestive) and diastolic (congestive) heart failure: Secondary | ICD-10-CM | POA: Diagnosis not present

## 2017-08-26 DIAGNOSIS — Z9119 Patient's noncompliance with other medical treatment and regimen: Secondary | ICD-10-CM | POA: Diagnosis not present

## 2017-08-26 DIAGNOSIS — J9602 Acute respiratory failure with hypercapnia: Secondary | ICD-10-CM

## 2017-08-26 DIAGNOSIS — I482 Chronic atrial fibrillation: Secondary | ICD-10-CM | POA: Diagnosis not present

## 2017-08-26 DIAGNOSIS — I2510 Atherosclerotic heart disease of native coronary artery without angina pectoris: Secondary | ICD-10-CM | POA: Diagnosis not present

## 2017-08-26 DIAGNOSIS — Z90710 Acquired absence of both cervix and uterus: Secondary | ICD-10-CM | POA: Diagnosis not present

## 2017-08-26 DIAGNOSIS — E039 Hypothyroidism, unspecified: Secondary | ICD-10-CM | POA: Diagnosis present

## 2017-08-26 DIAGNOSIS — J449 Chronic obstructive pulmonary disease, unspecified: Secondary | ICD-10-CM | POA: Diagnosis not present

## 2017-08-26 DIAGNOSIS — J441 Chronic obstructive pulmonary disease with (acute) exacerbation: Secondary | ICD-10-CM | POA: Diagnosis not present

## 2017-08-26 DIAGNOSIS — Z8542 Personal history of malignant neoplasm of other parts of uterus: Secondary | ICD-10-CM | POA: Diagnosis not present

## 2017-08-26 DIAGNOSIS — R488 Other symbolic dysfunctions: Secondary | ICD-10-CM | POA: Diagnosis not present

## 2017-08-26 DIAGNOSIS — G4733 Obstructive sleep apnea (adult) (pediatric): Secondary | ICD-10-CM | POA: Diagnosis not present

## 2017-08-26 DIAGNOSIS — I5033 Acute on chronic diastolic (congestive) heart failure: Secondary | ICD-10-CM | POA: Diagnosis not present

## 2017-08-26 DIAGNOSIS — Z87891 Personal history of nicotine dependence: Secondary | ICD-10-CM | POA: Diagnosis not present

## 2017-08-26 LAB — URINALYSIS, ROUTINE W REFLEX MICROSCOPIC
BILIRUBIN URINE: NEGATIVE
Glucose, UA: NEGATIVE mg/dL
Ketones, ur: NEGATIVE mg/dL
Leukocytes, UA: NEGATIVE
Nitrite: POSITIVE — AB
PROTEIN: NEGATIVE mg/dL
Specific Gravity, Urine: 1.015 (ref 1.005–1.030)
pH: 5.5 (ref 5.0–8.0)

## 2017-08-26 LAB — BLOOD GAS, ARTERIAL
ACID-BASE EXCESS: 11.1 mmol/L — AB (ref 0.0–2.0)
BICARBONATE: 38.5 mmol/L — AB (ref 20.0–28.0)
DRAWN BY: 362771
Delivery systems: POSITIVE
Expiratory PAP: 10
FIO2: 0.6
Inspiratory PAP: 20
O2 SAT: 98.1 %
PATIENT TEMPERATURE: 98.6
PO2 ART: 113 mmHg — AB (ref 83.0–108.0)
pCO2 arterial: 92.8 mmHg (ref 32.0–48.0)
pH, Arterial: 7.241 — ABNORMAL LOW (ref 7.350–7.450)

## 2017-08-26 LAB — TROPONIN I: Troponin I: <0.03 ng/mL (ref <0.03)

## 2017-08-26 LAB — COMPREHENSIVE METABOLIC PANEL
ALBUMIN: 3.3 g/dL — AB (ref 3.5–5.0)
ALT: 10 U/L — AB (ref 14–54)
ANION GAP: 11 (ref 5–15)
AST: 17 U/L (ref 15–41)
Alkaline Phosphatase: 96 U/L (ref 38–126)
BUN: 13 mg/dL (ref 6–20)
CHLORIDE: 94 mmol/L — AB (ref 101–111)
CO2: 32 mmol/L (ref 22–32)
Calcium: 8.5 mg/dL — ABNORMAL LOW (ref 8.9–10.3)
Creatinine, Ser: 0.99 mg/dL (ref 0.44–1.00)
GFR calc non Af Amer: 59 mL/min — ABNORMAL LOW (ref >60)
Glucose, Bld: 172 mg/dL — ABNORMAL HIGH (ref 65–99)
Potassium: 4.8 mmol/L (ref 3.5–5.1)
SODIUM: 137 mmol/L (ref 135–145)
Total Bilirubin: 0.5 mg/dL (ref 0.3–1.2)
Total Protein: 8.2 g/dL — ABNORMAL HIGH (ref 6.5–8.1)

## 2017-08-26 LAB — GLUCOSE, CAPILLARY: GLUCOSE-CAPILLARY: 167 mg/dL — AB (ref 65–99)

## 2017-08-26 LAB — RAPID URINE DRUG SCREEN, HOSP PERFORMED
Amphetamines: NOT DETECTED
Barbiturates: NOT DETECTED
Benzodiazepines: NOT DETECTED
COCAINE: NOT DETECTED
OPIATES: NOT DETECTED
Tetrahydrocannabinol: NOT DETECTED

## 2017-08-26 LAB — I-STAT CHEM 8, ED
BUN: 18 mg/dL (ref 6–20)
CALCIUM ION: 1.03 mmol/L — AB (ref 1.15–1.40)
CHLORIDE: 92 mmol/L — AB (ref 101–111)
Creatinine, Ser: 1 mg/dL (ref 0.44–1.00)
GLUCOSE: 186 mg/dL — AB (ref 65–99)
HCT: 38 % (ref 36.0–46.0)
Hemoglobin: 12.9 g/dL (ref 12.0–15.0)
Potassium: 4.8 mmol/L (ref 3.5–5.1)
SODIUM: 139 mmol/L (ref 135–145)
TCO2: 39 mmol/L — ABNORMAL HIGH (ref 22–32)

## 2017-08-26 LAB — URINALYSIS, MICROSCOPIC (REFLEX): WBC, UA: NONE SEEN WBC/hpf (ref 0–5)

## 2017-08-26 LAB — I-STAT TROPONIN, ED: TROPONIN I, POC: 0 ng/mL (ref 0.00–0.08)

## 2017-08-26 LAB — I-STAT CG4 LACTIC ACID, ED: LACTIC ACID, VENOUS: 0.8 mmol/L (ref 0.5–1.9)

## 2017-08-26 LAB — PROCALCITONIN: Procalcitonin: <0.1 ng/mL

## 2017-08-26 LAB — MRSA PCR SCREENING: MRSA by PCR: POSITIVE — AB

## 2017-08-26 LAB — BRAIN NATRIURETIC PEPTIDE: B Natriuretic Peptide: 585.9 pg/mL — ABNORMAL HIGH (ref 0.0–100.0)

## 2017-08-26 MED ORDER — INSULIN ASPART 100 UNIT/ML ~~LOC~~ SOLN
0.0000 [IU] | SUBCUTANEOUS | Status: DC
  Administered 2017-08-26 – 2017-08-27 (×2): 3 [IU] via SUBCUTANEOUS
  Administered 2017-08-27: 2 [IU] via SUBCUTANEOUS
  Administered 2017-08-27 (×2): 3 [IU] via SUBCUTANEOUS
  Administered 2017-08-28: 2 [IU] via SUBCUTANEOUS
  Administered 2017-08-28 (×2): 3 [IU] via SUBCUTANEOUS

## 2017-08-26 MED ORDER — ACETAMINOPHEN 325 MG PO TABS
650.0000 mg | ORAL_TABLET | ORAL | Status: DC | PRN
Start: 2017-08-26 — End: 2017-08-30
  Administered 2017-08-30: 650 mg via ORAL
  Filled 2017-08-26: qty 2

## 2017-08-26 MED ORDER — LORAZEPAM 2 MG/ML IJ SOLN
INTRAMUSCULAR | Status: AC
  Filled 2017-08-26: qty 1

## 2017-08-26 MED ORDER — BUPRENORPHINE HCL-NALOXONE HCL 8-2 MG SL SUBL
1.0000 | SUBLINGUAL_TABLET | Freq: Two times a day (BID) | SUBLINGUAL | Status: DC
Start: 2017-08-26 — End: 2017-08-30
  Administered 2017-08-26 – 2017-08-30 (×9): 1 via SUBLINGUAL
  Filled 2017-08-26 (×9): qty 1

## 2017-08-26 MED ORDER — SODIUM CHLORIDE 0.9% FLUSH
3.0000 mL | Freq: Two times a day (BID) | INTRAVENOUS | Status: DC
  Administered 2017-08-26 – 2017-08-29 (×7): 3 mL via INTRAVENOUS

## 2017-08-26 MED ORDER — FUROSEMIDE 10 MG/ML IJ SOLN
40.0000 mg | Freq: Two times a day (BID) | INTRAMUSCULAR | Status: DC
  Administered 2017-08-26 – 2017-08-27 (×3): 40 mg via INTRAVENOUS
  Filled 2017-08-26 (×3): qty 4

## 2017-08-26 MED ORDER — PANTOPRAZOLE SODIUM 40 MG IV SOLR
40.0000 mg | Freq: Every day | INTRAVENOUS | Status: DC
  Administered 2017-08-26 – 2017-08-29 (×3): 40 mg via INTRAVENOUS
  Filled 2017-08-26 (×3): qty 40

## 2017-08-26 MED ORDER — IPRATROPIUM-ALBUTEROL 0.5-2.5 (3) MG/3ML IN SOLN
3.0000 mL | Freq: Four times a day (QID) | RESPIRATORY_TRACT | Status: DC
  Administered 2017-08-26 – 2017-08-28 (×9): 3 mL via RESPIRATORY_TRACT
  Filled 2017-08-26 (×9): qty 3

## 2017-08-26 MED ORDER — LEVOTHYROXINE SODIUM 75 MCG PO TABS
75.0000 ug | ORAL_TABLET | Freq: Every day | ORAL | Status: DC
  Administered 2017-08-27 – 2017-08-30 (×4): 75 ug via ORAL
  Filled 2017-08-26 (×4): qty 1

## 2017-08-26 MED ORDER — DEXTROSE 5 % IV SOLN
500.0000 mg | Freq: Three times a day (TID) | INTRAVENOUS | Status: DC
  Administered 2017-08-26 – 2017-08-28 (×8): 500 mg via INTRAVENOUS
  Filled 2017-08-26 (×12): qty 5

## 2017-08-26 MED ORDER — IPRATROPIUM-ALBUTEROL 0.5-2.5 (3) MG/3ML IN SOLN
3.0000 mL | RESPIRATORY_TRACT | Status: DC | PRN
  Administered 2017-08-28: 3 mL via RESPIRATORY_TRACT

## 2017-08-26 MED ORDER — ENOXAPARIN SODIUM 40 MG/0.4ML ~~LOC~~ SOLN
40.0000 mg | Freq: Every day | SUBCUTANEOUS | Status: DC
  Administered 2017-08-26 – 2017-08-30 (×5): 40 mg via SUBCUTANEOUS
  Filled 2017-08-26 (×6): qty 0.4

## 2017-08-26 MED ORDER — FUROSEMIDE 10 MG/ML IJ SOLN
40.0000 mg | Freq: Once | INTRAMUSCULAR | Status: AC
  Administered 2017-08-26: 40 mg via INTRAVENOUS
  Filled 2017-08-26: qty 4

## 2017-08-26 MED ORDER — METOPROLOL TARTRATE 25 MG PO TABS
50.0000 mg | ORAL_TABLET | Freq: Two times a day (BID) | ORAL | Status: DC
  Administered 2017-08-26 – 2017-08-30 (×8): 50 mg via ORAL
  Filled 2017-08-26 (×8): qty 2

## 2017-08-26 MED ORDER — LEVOTHYROXINE SODIUM 100 MCG IV SOLR
37.5000 ug | Freq: Every day | INTRAVENOUS | Status: DC
  Administered 2017-08-26: 37.5 ug via INTRAVENOUS
  Filled 2017-08-26 (×2): qty 5

## 2017-08-26 MED ORDER — SODIUM CHLORIDE 0.9% FLUSH
3.0000 mL | INTRAVENOUS | Status: DC | PRN
Start: 2017-08-26 — End: 2017-08-29

## 2017-08-26 MED ORDER — SODIUM CHLORIDE 0.9 % IV SOLN: 250.0000 mL | INTRAVENOUS | Status: DC | PRN

## 2017-08-26 MED ORDER — LORAZEPAM 2 MG/ML IJ SOLN
0.5000 mg | Freq: Once | INTRAMUSCULAR | Status: AC
  Administered 2017-08-26: 0.5 mg via INTRAVENOUS

## 2017-08-26 NOTE — ED Notes (Signed)
Pt called out for help while on BiPap, stated she "needed her Suboxone". Pt restless, appears to be shaking/having tremors. Called Dr. Tamala Julian to notify, he will place orders and come assess pt.

## 2017-08-26 NOTE — Consult Note (Signed)
Reason for Consult: Congestive heart failure Referring Physician: [facility] hospitalist  [name] is an 61 y.o. female.  HPI: Patient is 61 year old female with past medical history significant for coronary artery disease, history of congestive heart failure secondary to diastolic dysfunction, chronic hypoxic respiratory insufficiency on home O2, morbid obesity, obstructive sleep apnea, COPD, hypothyroidism, diabetes mellitus, restless leg syndrome, chronic atrial fibrillation with [place] vasc score of 4, GERD, history of endometrial cancer, depression, was admitted because of progressive worsening shortness of breath associated with coughing. Patient states caregiver has turned down her oxygen for last 2 days. Patient denies any fever or chills. Patient denies any chest pain patient was noted by EMS hypoxic received duo nebs and 125 mg of Solu-Medrol and was noted to have O2 sats of 85%  and was noted to be hypoxic/ hypercarbic respiratory failure requiring BiPAP. Chest x-ray showed low lung volumes and suggestive of pulmonary edema and was noted to have elevated BNP. Patient is satting 95-98% on BiPAP is awake but no further meaningful history could be obtained because of acuity of her condition.  Past Medical History:  Diagnosis Date  . Acute and chronic respiratory failure with hypercapnia ([place])   . ADD (attention deficit disorder)   . Altered mental status    2nd to hypercapnea  . CHF (congestive heart failure) ([place])   . Chronic low back pain   . Complication of anesthesia    " I AM SLOW MTO WAKE UP AT ITMES "  . Coronary artery disease   . Depression   . Diabetes mellitus   . Endometrial cancer ([place])   . GERD (gastroesophageal reflux disease)   . Hypercapnia   . Hypertension   . Hypothyroidism   . Hypoxemia   . Morbid obesity ([place])   . Nephrolithiasis   . OSA (obstructive sleep apnea)   . Paroxysmal atrial fibrillation (HCC)   . Restless leg syndrome     Past Surgical History:   Procedure Laterality Date  . BACK SURGERY     x2  . DENTAL SURGERY    . SHOULDER SURGERY     x2  . SP PERC NEPHROSTOMY    . TONSILLECTOMY    . VAGINAL HYSTERECTOMY     x2    Family History  Problem Relation Age of Onset  . Cancer Father     Social History:  reports that she quit smoking about 42 years ago. Her smoking use included Cigarettes. She has a 1.00 pack-year smoking history. She has never used smokeless tobacco. She reports that she does not drink alcohol or use drugs.  Allergies:  Allergies  Allergen Reactions  . Aspirin Other (See Comments)    Chest pain  . Eliquis [Apixaban]     Excessive bleeding  . Nsaids Other (See Comments)    Chest pain  . Other Other (See Comments)    "BP med" combined with plavix, caused syncope  . Plavix [Clopidogrel] Other (See Comments)    syncope  . Sudafed [Pseudoephedrine] Palpitations    Raised BP  . Erythromycin Nausea And Vomiting  . Sulfa Antibiotics Other (See Comments)    constipation  . Sulfamethoxazole-Trimethoprim Diarrhea  . Altace [Ramipril] Other (See Comments)    Severe fatigue  . Zofran [Ondansetron Hcl] Other (See Comments)    "I put me into a coma"    Medications: I have reviewed the patient's current medications.  Results for orders placed or performed during the hospital encounter of 08/25/17 (from the past 48  hour(s))  CBC with Differential     Status: Abnormal   Collection Time: 08/25/17 10:40 PM  Result Value Ref Range   WBC 9.4 4.0 - 10.5 K/uL   RBC 4.44 3.87 - 5.11 MIL/uL   Hemoglobin 10.0 (L) 12.0 - 15.0 g/dL   HCT 37.1 36.0 - 46.0 %   MCV 83.6 78.0 - 100.0 fL   MCH 22.5 (L) 26.0 - 34.0 pg   MCHC 27.0 (L) 30.0 - 36.0 g/dL   RDW 17.5 (H) 11.5 - 15.5 %   Platelets 237 150 - 400 K/uL    Comment: REPEATED TO VERIFY PLATELET COUNT CONFIRMED BY SMEAR    Neutrophils Relative % 92 %   Lymphocytes Relative 6 %   Monocytes Relative 2 %   Eosinophils Relative 0 %   Basophils Relative 0 %    Neutro Abs 8.6 (H) 1.7 - 7.7 K/uL   Lymphs Abs 0.6 (L) 0.7 - 4.0 K/uL   Monocytes Absolute 0.2 0.1 - 1.0 K/uL   Eosinophils Absolute 0.0 0.0 - 0.7 K/uL   Basophils Absolute 0.0 0.0 - 0.1 K/uL   RBC Morphology POLYCHROMASIA PRESENT     Comment: STOMATOCYTES  Blood gas, arterial     Status: Abnormal   Collection Time: 08/25/17 11:00 PM  Result Value Ref Range   O2 Content 4.0 L/min   Delivery systems NASAL CANNULA    pH, Arterial 7.205 (L) 7.350 - 7.450   pCO2 arterial 101 (HH) 32.0 - 48.0 mmHg    Comment: CRITICAL RESULT CALLED TO, READ BACK BY AND VERIFIED WITH: TERI COCKMAN RRT, RCP AT 2313 BY JESSICA SMITH ALLEN RRT, RCP ON 08/25/2017    pO2, Arterial 61.6 (L) 83.0 - 108.0 mmHg   Bicarbonate 38.4 (H) 20.0 - 28.0 mmol/L   Acid-Base Excess 10.4 (H) 0.0 - 2.0 mmol/L   O2 Saturation 87.7 %   Patient temperature 98.6    Collection site LEFT RADIAL    Drawn by [phone]    Sample type ARTERIAL DRAW    Allens test (pass/fail) PASS PASS  Brain natriuretic peptide     Status: Abnormal   Collection Time: 08/26/17 12:41 AM  Result Value Ref Range   B Natriuretic Peptide 585.9 (H) 0.0 - 100.0 pg/mL  Troponin I     Status: None   Collection Time: 08/26/17 12:41 AM  Result Value Ref Range   Troponin I <0.03 <0.03 ng/mL  I-stat troponin, ED     Status: None   Collection Time: 08/26/17  2:29 AM  Result Value Ref Range   Troponin i, poc 0.00 0.00 - 0.08 ng/mL   Comment 3            Comment: Due to the release kinetics of cTnI, a negative result within the first hours of the onset of symptoms does not rule out myocardial infarction with certainty. If myocardial infarction is still suspected, repeat the test at appropriate intervals.   I-stat Chem 8, ED     Status: Abnormal   Collection Time: 08/26/17  2:31 AM  Result Value Ref Range   Sodium 139 135 - 145 mmol/L   Potassium 4.8 3.5 - 5.1 mmol/L   Chloride 92 (L) 101 - 111 mmol/L   BUN 18 6 - 20 mg/dL   Creatinine, Ser 1.00 0.44 -  1.00 mg/dL   Glucose, Bld 186 (H) 65 - 99 mg/dL   Calcium, Ion 1.03 (L) 1.15 - 1.40 mmol/L   TCO2 39 (H) 22 - 32 mmol/L  Hemoglobin 12.9 12.0 - 15.0 g/dL   HCT 38.0 36.0 - 46.0 %  I-Stat CG4 Lactic Acid, ED     Status: None   Collection Time: 08/26/17  2:31 AM  Result Value Ref Range   Lactic Acid, Venous 0.80 0.5 - 1.9 mmol/L  Troponin I     Status: None   Collection Time: 08/26/17  2:33 AM  Result Value Ref Range   Troponin I <0.03 <0.03 ng/mL  Comprehensive metabolic panel     Status: Abnormal   Collection Time: 08/26/17  2:33 AM  Result Value Ref Range   Sodium 137 135 - 145 mmol/L   Potassium 4.8 3.5 - 5.1 mmol/L   Chloride 94 (L) 101 - 111 mmol/L   CO2 32 22 - 32 mmol/L   Glucose, Bld 172 (H) 65 - 99 mg/dL   BUN 13 6 - 20 mg/dL   Creatinine, Ser 0.99 0.44 - 1.00 mg/dL   Calcium 8.5 (L) 8.9 - 10.3 mg/dL   Total Protein 8.2 (H) 6.5 - 8.1 g/dL   Albumin 3.3 (L) 3.5 - 5.0 g/dL   AST 17 15 - 41 U/L   ALT 10 (L) 14 - 54 U/L   Alkaline Phosphatase 96 38 - 126 U/L   Total Bilirubin 0.5 0.3 - 1.2 mg/dL   GFR calc non Af Amer 59 (L) >60 mL/min   GFR calc Af Amer >60 >60 mL/min    Comment: (NOTE) The eGFR has been calculated using the CKD EPI equation. This calculation has not been validated in all clinical situations. eGFR's persistently <60 mL/min signify possible Chronic Kidney Disease.    Anion gap 11 5 - 15  Blood gas, arterial     Status: Abnormal   Collection Time: 08/26/17  3:09 AM  Result Value Ref Range   FIO2 0.60    Delivery systems BILEVEL POSITIVE AIRWAY PRESSURE    Inspiratory PAP 20    Expiratory PAP 10    pH, Arterial 7.241 (L) 7.350 - 7.450   pCO2 arterial 92.8 (HH) 32.0 - 48.0 mmHg    Comment: CRITICAL RESULT CALLED TO, READ BACK BY AND VERIFIED WITH:  TERRI COCKMAN RRT AT [place] RRT, RCP ON 08/26/2017    pO2, Arterial 113 (H) 83.0 - 108.0 mmHg   Bicarbonate 38.5 (H) 20.0 - 28.0 mmol/L   Acid-Base Excess 11.1 (H) 0.0 - 2.0 mmol/L    O2 Saturation 98.1 %   Patient temperature 98.6    Collection site RIGHT RADIAL    Drawn by [id number]    Sample type ARTERIAL DRAW    Allens test (pass/fail) PASS PASS    Dg Chest Port 1 View  Result Date: 08/25/2017 CLINICAL DATA:  Shortness of breath tonight. EXAM: PORTABLE CHEST 1 VIEW COMPARISON:  02/10/2016 FINDINGS: Low lung volumes persist. The heart is enlarged. Increased ill-defined peribronchial opacities throughout both lungs suspicious for pulmonary edema. Linear opacity in the right mid lung may be external artifact, atelectasis, or fluid in the fissure. No pneumothorax. Soft tissue attenuation from body habitus limits assessment. IMPRESSION: Low lung volumes cardiomegaly and ill-defined peribronchial opacities suspicious for pulmonary edema. Overall findings suggest CHF. Electronically Signed   By: Jeb Levering M.D.   On: 08/25/2017 23:20    Review of Systems  Unable to perform ROS: Acuity of condition   Blood pressure 128/72, pulse 62, temperature 98.7 F (37.1 C), temperature source Rectal, resp. rate 14, height 5' 5" (1.651 m), weight 136.1 kg (300 lb), SpO2  99 %. Physical Exam  Constitutional: She is oriented to person, place, and time.  HENT:  Head: Atraumatic.  Eyes: Pupils are equal, round, and reactive to light. Conjunctivae are normal. Left eye exhibits no discharge.  Neck: Normal range of motion. Neck supple. No JVD present. No tracheal deviation present. No thyromegaly present.  Cardiovascular:  Irregularly irregular S1 and S2 soft  Respiratory:  Decreased breath sound at bases with faint rales noted  GI: Soft. She exhibits distension. There is no tenderness. There is no rebound and no guarding.  Musculoskeletal:  No clubbing cyanosis edema left leg with induration noted  Neurological: She is alert and oriented to person, place, and time.    Assessment/Plan: Decompensated congestive heart failure secondary to preserved LV systolic function rule out  ischemia Acute on chronic hypoxic hypercarbic respiratory failure Coronary artery disease Hypertension Diabetes mellitus Chronic atrial fibrillation with controlled ventricular response chadsvasc score of 4 Hypothyroidism Morbid obesity COPD Obstructive sleep apnea GERD History of endometrial carcinoma Depression Cellulitis left leg Plan Agree with present management. Will add Ancef for cellulitis Start acetazolamide as per orders Consider CCM consult  Dr. Doylene Canard to follow from tomorrow Charolette Forward 08/26/2017, 8:05 AM

## 2017-08-26 NOTE — ED Notes (Signed)
Respiratory aware of ABG at 3am

## 2017-08-26 NOTE — ED Notes (Signed)
Admitting at bedside 

## 2017-08-26 NOTE — Progress Notes (Signed)
Critical ABG values given to B. Alfredo Martinez, NP.

## 2017-08-26 NOTE — Progress Notes (Signed)
eLink Physician-Brief Progress Note Patient Name: [name] DOB: 1956-05-21 MRN: [id number]   Date of Service  08/26/2017  HPI/Events of Note  Freq PVCs K 4.8 in am  eICU Interventions  Resume home metoprolol      Intervention Category Intermediate Interventions: Arrhythmia - evaluation and management  Jammie Clink V. 08/26/2017, 8:49 PM

## 2017-08-26 NOTE — H&P (Addendum)
History and Physical    [name] GEX:[id number] DOB: 1956-01-23 DOA: 08/25/2017  Referring MD/NP/PA: Dr. Eulis Foster PCP: Dixie Dials, MD  Patient coming from: Home via EMS  Chief Complaint: Shortness of breath Barrie Dunker  HPI: [name] is a 61 y.o. female with medical history significant of chronic respiratory failure with hypercapnia, systolic and diastolic CHF, DM type II, CAD, hypothyroidism, chronic pain, morbid obesity, and OSA on CPAP; who presents with complaints of shortness of breath. Patient is normally on 4 L of oxygen 24/7. History is limited due to patient's current mental acuity as she repeatedly falls sleep during evaluation. It appears that the patient had not been feeling well for the last week reporting complaints of cough with yellow sputum production,  and progressively worsening shortness of breath. Patient admits to wheezing intermittently, but does not have home breathing treatments.  ED Course:En route with EMS patient was noted to have O2 sats 85% on 4 L and was given x2 duonebs and 125 solumedrol prior to arrival. In the emergency department patient was noted to be afebrile, pulse 81-82, respirations 12-16, blood pressures maintain an O2 saturations 89-98%. She was placed on BiPAP after the initial ABG. The second ABG showed a pH of 7.205, PCO2 101, PO2 61.6. Patient was noted to be significantly lethargic and therefore Critical care was consulted, but recommended continuation with BiPAP.  Chest x-ray showed low lung volumes, but was suggestive of pulmonary edema. BNP elevated at 585.9. Patient was given 40 mg of Lasix.  Review of Systems  Unable to perform ROS: Mental status change  Constitutional: Positive for malaise/fatigue. Negative for chills and fever.  HENT: Negative for ear discharge and nosebleeds.   Eyes: Negative for photophobia and pain.  Respiratory: Positive for cough, shortness of breath and wheezing.   Cardiovascular: Positive for  orthopnea and leg swelling. Negative for chest pain.  Gastrointestinal: Negative for abdominal pain, nausea and vomiting.  Genitourinary: Negative for dysuria and frequency.  Neurological: Negative for focal weakness and seizures.  Psychiatric/Behavioral: Negative for suicidal ideas.    Past Medical History:  Diagnosis Date  . Acute and chronic respiratory failure with hypercapnia ([place])   . ADD (attention deficit disorder)   . Altered mental status    2nd to hypercapnea  . CHF (congestive heart failure) ([place])   . Chronic low back pain   . Complication of anesthesia    " I AM SLOW MTO WAKE UP AT ITMES "  . Coronary artery disease   . Depression   . Diabetes mellitus   . Endometrial cancer ([place])   . GERD (gastroesophageal reflux disease)   . Hypercapnia   . Hypertension   . Hypothyroidism   . Hypoxemia   . Morbid obesity ([place])   . Nephrolithiasis   . OSA (obstructive sleep apnea)   . Paroxysmal atrial fibrillation ([facility])   . Restless leg syndrome     Past Surgical History:  Procedure Laterality Date  . BACK SURGERY     x2  . DENTAL SURGERY    . SHOULDER SURGERY     x2  . SP PERC NEPHROSTOMY    . TONSILLECTOMY    . VAGINAL HYSTERECTOMY     x2     reports that she quit smoking about 42 years ago. Her smoking use included Cigarettes. She has a 1.00 pack-year smoking history. She has never used smokeless tobacco. She reports that she does not drink alcohol or use drugs.  Allergies  Allergen Reactions  .  Aspirin Other (See Comments)    Chest pain  . Eliquis [Apixaban]     Excessive bleeding  . Nsaids Other (See Comments)    Chest pain  . Other Other (See Comments)    "BP med" combined with plavix, caused syncope  . Plavix [Clopidogrel] Other (See Comments)    syncope  . Sudafed [Pseudoephedrine] Palpitations    Raised BP  . Erythromycin Nausea And Vomiting  . Sulfa Antibiotics Other (See Comments)    constipation  . Sulfamethoxazole-Trimethoprim Diarrhea  .  Altace [Ramipril] Other (See Comments)    Severe fatigue  . Zofran [Ondansetron Hcl] Other (See Comments)    "I put me into a coma"    Family History  Problem Relation Age of Onset  . Cancer Father     Prior to Admission medications   Medication Sig Start Date End Date Taking? Authorizing Provider  buprenorphine (SUBUTEX) 8 MG SUBL SL tablet Place 8 mg under the tongue 2 (two) times daily.  06/18/17   [provider]  cetirizine (ZYRTEC) 10 MG tablet Take 10 mg by mouth at bedtime.    [provider]  clotrimazole-betamethasone (LOTRISONE) cream Apply 1 application topically daily as needed (antifungal).     [provider]  diltiazem (DILACOR XR) 240 MG 24 hr capsule Take 1 capsule (240 mg total) by mouth daily. 11/11/14   Dixie Dials, MD  furosemide (LASIX) 40 MG tablet Take 2 tablets (80 mg total) by mouth daily. 07/11/17   Dessa Phi Chahn-Yang, DO  gabapentin (NEURONTIN) 100 MG capsule Take 100 mg by mouth at bedtime. 06/18/17   [provider]  Metoprolol Tartrate 75 MG TABS Take 75 mg by mouth 2 (two) times daily. 01/02/16   Hosie Poisson, MD  prochlorperazine (COMPAZINE) 10 MG tablet Take 10 mg by mouth every 6 (six) hours as needed for nausea or vomiting.    [provider]  ranitidine (ZANTAC) 150 MG tablet Take 150 mg by mouth 2 (two) times daily.    [provider]  sertraline (ZOLOFT) 100 MG tablet Take 200 mg by mouth every morning.     [provider]  SYNTHROID 75 MCG tablet Take 75 mcg by mouth daily before breakfast.  06/27/17   [provider]    Physical Exam:  Constitutional: Elderly obese female who appears significantly lethargic, but will awake shortly to answer short questions. Vitals:   08/25/17 2200 08/25/17 2300 08/25/17 2323 08/26/17 0022  BP: 140/81 136/86 136/86   Pulse: 82 81    Resp: 12 16    Temp:    98.7 F (37.1 C)  TempSrc:    Rectal  SpO2: 95% 92%    Weight:      Height:        Eyes: PERRL, lids and conjunctivae normal ENMT: Mucous membranes are dry Posterior pharynx clear of any exudate or lesions. Poor dentition Neck: normal, supple, no masses, no thyromegaly Respiratory: No acute respiratory distress on BiPAP decreased overall air movement noted with positive crackles no wheezes noted at this time. Cardiovascular: Regular rate and rhythm, no murmurs / rubs / gallops. 3+ bilateral lower extremity edema. 2+ pedal pulses. No carotid bruits.  Abdomen: no tenderness, no masses palpated. No hepatosplenomegaly. Bowel sounds positive.  Musculoskeletal: no clubbing / cyanosis. No joint deformity upper and lower extremities. Good ROM, no contractures. Normal muscle tone.  Skin: no rashes, lesions, ulcers. No induration Neurologic: CN 2-12 grossly intact. Patient able to move all 4 extremities. Psychiatric:  Lethargic, but oriented at least to person and place.    Labs on Admission: I have personally reviewed following labs and imaging studies  CBC:  Recent Labs Lab 08/25/17 2240  WBC 9.4  NEUTROABS 8.6*  HGB 10.0*  HCT 37.1  MCV 83.6  PLT 703   Basic Metabolic Panel: No results for input(s): NA, K, CL, CO2, GLUCOSE, BUN, CREATININE, CALCIUM, MG, PHOS in the last 168 hours. GFR: CrCl cannot be calculated (Patient's most recent lab result is older than the maximum 21 days allowed.). Liver Function Tests: No results for input(s): AST, ALT, ALKPHOS, BILITOT, PROT, ALBUMIN in the last 168 hours. No results for input(s): LIPASE, AMYLASE in the last 168 hours. No results for input(s): AMMONIA in the last 168 hours. Coagulation Profile: No results for input(s): INR, PROTIME in the last 168 hours. Cardiac Enzymes: No results for input(s): CKTOTAL, CKMB, CKMBINDEX, TROPONINI in the last 168 hours. BNP (last 3 results) No results for input(s): PROBNP in the last 8760 hours. HbA1C: No results for input(s): HGBA1C in the last 72 hours. CBG: No results for  input(s): GLUCAP in the last 168 hours. Lipid Profile: No results for input(s): CHOL, HDL, LDLCALC, TRIG, CHOLHDL, LDLDIRECT in the last 72 hours. Thyroid Function Tests: No results for input(s): TSH, T4TOTAL, FREET4, T3FREE, THYROIDAB in the last 72 hours. Anemia Panel: No results for input(s): VITAMINB12, FOLATE, FERRITIN, TIBC, IRON, RETICCTPCT in the last 72 hours. Urine analysis:    Component Value Date/Time   COLORURINE YELLOW 06/29/2017 2325   APPEARANCEUR CLOUDY (A) 06/29/2017 2325   LABSPEC 1.012 06/29/2017 2325   PHURINE 5.0 06/29/2017 2325   GLUCOSEU NEGATIVE 06/29/2017 2325   HGBUR LARGE (A) 06/29/2017 2325   BILIRUBINUR NEGATIVE 06/29/2017 2325   KETONESUR NEGATIVE 06/29/2017 2325   PROTEINUR 30 (A) 06/29/2017 2325   UROBILINOGEN 0.2 11/08/2014 1942   NITRITE NEGATIVE 06/29/2017 2325   LEUKOCYTESUR MODERATE (A) 06/29/2017 2325   Sepsis Labs: No results found for this or any previous visit (from the past 240 hour(s)).   Radiological Exams on Admission: Dg Chest Port 1 View  Result Date: 08/25/2017 CLINICAL DATA:  Shortness of breath tonight. EXAM: PORTABLE CHEST 1 VIEW COMPARISON:  02/10/2016 FINDINGS: Low lung volumes persist. The heart is enlarged. Increased ill-defined peribronchial opacities throughout both lungs suspicious for pulmonary edema. Linear opacity in the right mid lung may be external artifact, atelectasis, or fluid in the fissure. No pneumothorax. Soft tissue attenuation from body habitus limits assessment. IMPRESSION: Low lung volumes cardiomegaly and ill-defined peribronchial opacities suspicious for pulmonary edema. Overall findings suggest CHF. Electronically Signed   By: Jeb Levering M.D.   On: 08/25/2017 23:20    EKG: Independently reviewed. Atrial fibrillation at 99 bpm  Assessment/Plan Respiratory failure with hypercapnia and hypoxia 2/2 systolic and diastolic CHF exacerbation: Patient presents with shortness of breath on home oxygen  settings. With ABG showing pH of 7.205 with PCO2 of 101 to respiratory acidosis. Chest x-ray showing signs suggestive of pulmonary edema with elevated BNP of 585.9. Last EF noted to be 45-50% with grade 1 diastolic dysfunction in 5/00/9381. - Admit to stepdown bed - Continuous pulse oximetry with nasal cannula oxygen as needed to keep O2 saturations >92% - continue BiPAP  - neuro checks - recheck ABG at 3 a.m - Strict I&Os and daily weights - Elevate lower extremities - Lasix 40 mg IV Bid - Reassess in a.m. and adjust diuresis as needed. - Check echocardiogram - Optimize medical management as able -  May warrant consultation to cardiology in a.m.  Acute encephalopathy: Likely secondary to above with hypercapnia. - Continue monitoring her status - Will call PCCM if respiratory status or mentation worsens  Chronic pain: Patient appears to be on Subutex in outpatient setting. Question whether other drugs on board. - Check UDS   Atrial fibrillation: Chronic. Chadsvasc score = 4 it appears anticoagulation was stopped due to bleeding issues during last hospitalization on 06/2017  -  will need to restart Cardizem and metoprolol   COPD - Duonebs Qid and Prn sob/wheezing  Diabetes mellitus type 2 with peripheral neuropathy: Last hemoglobin A1c 6.7 on 07/03/2007. Patient does not take any oral medications or insulin for her diabetes.  - Continue to monitor   Essential hypertension - Continue to monitor and restart on home meds when able  Hypochromic Anemia:Chronic. Patient presents with a hemoglobin of 10 with low MCH. Baseline hemoglobin appears to run 9-10 per review of records. No reported bleeding. - Continue to monitor  CAD  Hypothyroidism - Continue Synthroid IV  Morbid obesity BMI 49.92  GERD - Protonix IV   DVT prophylaxis:  lovenox   Code Status: full  Family Communication: No family present at bedside Disposition Plan: TBD  Consults called: none  Admission status:  Inpatient   Norval Morton MD [facility] Pager [phone]   If 7PM-7AM, please contact night-coverage [email or web address] Password [email or web address]  08/26/2017, 1:01 AM

## 2017-08-26 NOTE — Progress Notes (Signed)
08/26/2017 8:47 AM  08/25/2017  9:18 PM  08/26/2017 8:47 AM  [name] was seen and examined in the ED. She is still encephalopathic and trying to pull off the bipap.   She is diuresing.  Her ABGs were reviewed.  I have called the PCCM service and asked them to come and evaluate the patient.  The H&P by the admitting provider, orders, imaging was reviewed.  Please see new orders.  Will continue to follow.   Murvin Natal, MD [facility]

## 2017-08-26 NOTE — ED Notes (Signed)
Respiratory aware of need to transport patient

## 2017-08-26 NOTE — Consult Note (Signed)
PULMONARY / CRITICAL CARE MEDICINE   Name: [name] MRN: [id number] DOB: 12-16-1955    ADMISSION DATE:  08/25/2017 CONSULTATION DATE:  08/26/17  REFERRING MD:  Dr. Wynetta Emery / [facility]   CHIEF COMPLAINT:  Acute Hypercarbic Respiratory Failure   HISTORY OF PRESENT ILLNESS:   61 y/o F who presented to [facility] ER on 9/15 via EMS from home with reports of "being sick" for one week.  The patient was found to be hypoxic with sats of 85%.  She was treated with O2, duonebs and solumedrol in route to ER.    At baseline, the patient lives at home with the help of her daughter.  She is mostly bed to bedside commode bound and has an ulcer on her bottom.   She has a medical history of chronic hypoxic respiratory failure on 3.5L with hypercapnia, OSA on CPAP, RLS, systolic / diastolic CHF (LVEF 61-60%, grade 1 diastolic dysfunction 06/3709), DM, GERD, CAD, PAF. He reports she recently had her oxygen assessed at home and O2 was accidentally decreased for some time. She thinks that she was on 1-2 L over the last week before she realized it needed to be turned up.  Patient reports increased cough and swelling in lower extremities. She also states that she has not been taking her Lasix as prescribed because it is so difficult for her to get up and down to the bathroom.  She also has noted an increase in her baseline "shaking".  She feels she needs more help at home.    Initial ABG 7.205 / 101 / 61 / 38.  She was placed on BiPAP.  Initial CXR showed low lung volumes & concern for CHF.  She was encephalopathic.  Follow up ABG 7.241 / 92 / 113 / 38.5.  The patient was admitted per [facility] for further care.  She received ativan in the ER.  In addition, she takes suboxone BID.   PCCM consulted for evaluation of acute on chronic hypercarbic respiratory failure.   PAST MEDICAL HISTORY :  She  has a past medical history of Acute and chronic respiratory failure with hypercapnia ([place]); ADD (attention deficit disorder); Altered  mental status; CHF (congestive heart failure) ([place]); Chronic low back pain; Complication of anesthesia; Coronary artery disease; Depression; Diabetes mellitus; Endometrial cancer ([place]); GERD (gastroesophageal reflux disease); Hypercapnia; Hypertension; Hypothyroidism; Hypoxemia; Morbid obesity ([place]); Nephrolithiasis; OSA (obstructive sleep apnea); Paroxysmal atrial fibrillation ([place]); and Restless leg syndrome.  PAST SURGICAL HISTORY: She  has a past surgical history that includes Back surgery; Shoulder surgery; Vaginal hysterectomy; Dental surgery; Tonsillectomy; and SP PERC NEPHROSTOMY.  Allergies  Allergen Reactions  . Aspirin Other (See Comments)    Chest pain  . Eliquis [Apixaban]     Excessive bleeding  . Nsaids Other (See Comments)    Chest pain  . Other Other (See Comments)    "BP med" combined with plavix, caused syncope  . Plavix [Clopidogrel] Other (See Comments)    syncope  . Sudafed [Pseudoephedrine] Palpitations    Raised BP  . Erythromycin Nausea And Vomiting  . Sulfa Antibiotics Other (See Comments)    constipation  . Sulfamethoxazole-Trimethoprim Diarrhea  . Altace [Ramipril] Other (See Comments)    Severe fatigue  . Zofran [Ondansetron Hcl] Other (See Comments)    "I put me into a coma"    No current facility-administered medications on file prior to encounter.    Current Outpatient Prescriptions on File Prior to Encounter  Medication Sig  . buprenorphine (SUBUTEX) 8  MG SUBL SL tablet Place 8 mg under the tongue 2 (two) times daily.   . cetirizine (ZYRTEC) 10 MG tablet Take 10 mg by mouth at bedtime.  . clotrimazole-betamethasone (LOTRISONE) cream Apply 1 application topically daily as needed (antifungal).   Marland Kitchen diltiazem (DILACOR XR) 240 MG 24 hr capsule Take 1 capsule (240 mg total) by mouth daily.  . furosemide (LASIX) 40 MG tablet Take 2 tablets (80 mg total) by mouth daily.  Marland Kitchen gabapentin (NEURONTIN) 100 MG capsule Take 100 mg by mouth at bedtime.  .  Metoprolol Tartrate 75 MG TABS Take 75 mg by mouth 2 (two) times daily.  . prochlorperazine (COMPAZINE) 10 MG tablet Take 10 mg by mouth every 6 (six) hours as needed for nausea or vomiting.  . ranitidine (ZANTAC) 150 MG tablet Take 150 mg by mouth 2 (two) times daily.  . sertraline (ZOLOFT) 100 MG tablet Take 200 mg by mouth every morning.   Marland Kitchen SYNTHROID 75 MCG tablet Take 75 mcg by mouth daily before breakfast.     FAMILY HISTORY:  Her indicated that the status of her father is unknown.    SOCIAL HISTORY: She  reports that she quit smoking about 42 years ago. Her smoking use included Cigarettes. She has a 1.00 pack-year smoking history. She has never used smokeless tobacco. She reports that she does not drink alcohol or use drugs.  REVIEW OF SYSTEMS:  POSITIVES IN BOLD Gen: Denies fever, chills, weight change, fatigue, night sweats HEENT: Denies blurred vision, double vision, hearing loss, tinnitus, sinus congestion, rhinorrhea, sore throat, neck stiffness, dysphagia PULM: Denies shortness of breath, cough, sputum production, hemoptysis, wheezing CV: Denies chest pain, edema, orthopnea, paroxysmal nocturnal dyspnea, palpitations GI: Denies abdominal pain, nausea, vomiting, diarrhea, hematochezia, melena, constipation, change in bowel habits GU: Denies dysuria, hematuria, polyuria, oliguria, urethral discharge Endocrine: Denies hot or cold intolerance, polyuria, polyphagia or appetite change Derm: Denies rash, dry skin, scaling or peeling skin change Heme: Denies easy bruising, bleeding, bleeding gums Neuro: Denies headache, numbness, weakness, slurred speech, loss of memory or consciousness   SUBJECTIVE:    VITAL SIGNS: BP 128/72   Pulse 62   Temp 98.7 F (37.1 C) (Rectal)   Resp 14   Ht 5\' 5"  (1.651 m)   Wt 300 lb (136.1 kg)   SpO2 99%   BMI 49.92 kg/m   HEMODYNAMICS:    VENTILATOR SETTINGS: Vent Mode: PCV;BIPAP FiO2 (%):  [60 %] 60 % Set Rate:  [15 bmp] 15 bmp PEEP:   [10 cmH20] 10 cmH20  INTAKE / OUTPUT: No intake/output data recorded.  PHYSICAL EXAMINATION: General: morbidly obese female in NAD, sitting up on ER stretcher  HEENT: MM pink/moist, no jvd  PSY: calm/appropriate  Neuro: awakens to voice, speech clear, MAE, intermittent periods of drowsiness, asterixis noted  CV: s1s2 rrr, no m/r/g PULM: even/non-labored, lungs bilaterally clear anterior, diminished lower XN:ATFT, non-tender, bsx4 active  Extremities: warm/dry, 2-3+ pitting BLE edema, clubbing of nailbeds noted Skin: no rashes or lesions  LABS:  BMET  Recent Labs Lab 08/26/17 0231 08/26/17 0233  NA 139 137  K 4.8 4.8  CL 92* 94*  CO2  --  32  BUN 18 13  CREATININE 1.00 0.99  GLUCOSE 186* 172*    Electrolytes  Recent Labs Lab 08/26/17 0233  CALCIUM 8.5*    CBC  Recent Labs Lab 08/25/17 2240 08/26/17 0231  WBC 9.4  --   HGB 10.0* 12.9  HCT 37.1 38.0  PLT 237  --  Coag's No results for input(s): APTT, INR in the last 168 hours.  Sepsis Markers  Recent Labs Lab 08/26/17 0231  LATICACIDVEN 0.80    ABG  Recent Labs Lab 08/25/17 2300 08/26/17 0309  PHART 7.205* 7.241*  PCO2ART 101* 92.8*  PO2ART 61.6* 113*    Liver Enzymes  Recent Labs Lab 08/26/17 0233  AST 17  ALT 10*  ALKPHOS 96  BILITOT 0.5  ALBUMIN 3.3*    Cardiac Enzymes  Recent Labs Lab 08/26/17 0041 08/26/17 0233  TROPONINI <0.03 <0.03    Glucose No results for input(s): GLUCAP in the last 168 hours.  Imaging Dg Chest Port 1 View  Result Date: 08/25/2017 CLINICAL DATA:  Shortness of breath tonight. EXAM: PORTABLE CHEST 1 VIEW COMPARISON:  02/10/2016 FINDINGS: Low lung volumes persist. The heart is enlarged. Increased ill-defined peribronchial opacities throughout both lungs suspicious for pulmonary edema. Linear opacity in the right mid lung may be external artifact, atelectasis, or fluid in the fissure. No pneumothorax. Soft tissue attenuation from body habitus  limits assessment. IMPRESSION: Low lung volumes cardiomegaly and ill-defined peribronchial opacities suspicious for pulmonary edema. Overall findings suggest CHF. Electronically Signed   By: Jeb Levering M.D.   On: 08/25/2017 23:20     STUDIES:  ECHO 9/16 >>  UDS 9/16 >>   CULTURES: BCx2 9/16 >>  RVP 9/16 >>   ANTIBIOTICS:   SIGNIFICANT EVENTS: 9/16  Admit with hypercarbic respiratory failure / decompensated CHF   LINES/TUBES:   DISCUSSION: 61 year old female admitted with hypercarbic respiratory failure and decompensated CHF. She recently has missed several doses of Lasix and her oxygen was accidentally decreased to 1-2L.    ASSESSMENT / PLAN:  PULMONARY A: Acute on Chronic Hypercarbic Respiratory Failure  OSA on CPAP  Chronic Hypoxic Respiratory Failure  Pulmonary Edema  P:   Change admit to ICU to monitor respiratory status  Monitor on BiPAP for now, may need intubation Duoneb Q6 Pulmonary hygiene - IS, mobilize Lasix 40 mg BID  Minimize sedating medications   CARDIOVASCULAR A:  Acute Decompensated CHF  Chronic Systolic / Diastolic CHF  Hx PAF (not on anticoagulation due to prior bleeding), CAD, HTN P:  ICU monitoring Hold home cardizem, metoprolol   Assess ECHO  Strict I/O's Trend troponin   RENAL A:   No Acute issues  P:   Trend BMP / urinary output Replace electrolytes as indicated Avoid nephrotoxic agents, ensure adequate renal perfusion  GASTROINTESTINAL A:   Morbid Obesity  GERD  P:   NPO while on BiPAP  PPI   HEMATOLOGIC A:   Anemia - suspect of chronic disease, baseline 10, no bleeding  P:  Trend CBC Lovenox for DVT prophylaxis    INFECTIOUS A:   Decubitus Ulcer - sacral, present on admit  Rule Out Infectious Process  P:   Assess blood cultures  Assess viral panel  Trend PCT WOC consult   ENDOCRINE A:  Hypothyroidism   DM with Peripheral Neuropathy - not on oral medications or insulin, last A1c 6.7   P:   Monitor   Synthroid IV  NEUROLOGIC A:   Acute Encephalopathy - in setting of hypercarbic respiratory failure Chronic Pain  P:   RASS goal: n/a Limit sedating medications Continue suboxone  FAMILY  - Updates: Patient updated on plan of care 9/16  - Inter-disciplinary family meet or Palliative Care meeting due by:  9/23  CC Time: 59 minutes   Noe Gens, NP-C [facility] [facility] Pgr: [phone] or if  no answer [phone] 08/26/2017, 9:14 AM

## 2017-08-26 NOTE — Progress Notes (Signed)
RT note-ABG repeated off Bipap and on 3.5l min [place], improved, increased fio2 4l/min [place], continue to monitor, patient will wear Bipap tonight for HS.

## 2017-08-26 NOTE — ED Notes (Signed)
This RN spoke to [employer] who was reviewing patient's medications.  Pt noted to have Suboxone in her purse.  When asked by pharm tech when the last time she took it patient states "just a few minutes ago".  This RN will speak to patient about not taking medications that are not scheduled or prescribed by the MD while in the hospital.

## 2017-08-27 ENCOUNTER — Inpatient Hospital Stay (HOSPITAL_COMMUNITY): Payer: Medicare Other

## 2017-08-27 DIAGNOSIS — J810 Acute pulmonary edema: Secondary | ICD-10-CM

## 2017-08-27 DIAGNOSIS — E1142 Type 2 diabetes mellitus with diabetic polyneuropathy: Secondary | ICD-10-CM

## 2017-08-27 DIAGNOSIS — I5043 Acute on chronic combined systolic (congestive) and diastolic (congestive) heart failure: Secondary | ICD-10-CM

## 2017-08-27 LAB — PROCALCITONIN: Procalcitonin: <0.1 ng/mL

## 2017-08-27 LAB — ECHOCARDIOGRAM COMPLETE
AOASC: 31 cm
E decel time: 169 msec
FS: 38 % (ref 28–44)
HEIGHTINCHES: 65 in
IV/PV OW: 0.98
LA diam end sys: 47 mm
LA diam index: 1.77 cm/m2
LASIZE: 47 mm
LDCA: 3.46 cm2
LVOT diameter: 21 mm
Lateral S' vel: 10.7 cm/s
MV Dec: 169
MV Peak grad: 5 mmHg
MV pk E vel: 116 m/s
MVPKAVEL: 36.9 m/s
PW: 9.5 mm — AB (ref 0.6–1.1)
Reg peak vel: 238 cm/s
TAPSE: 25.4 mm
TR max vel: 238 cm/s
WEIGHTICAEL: 5079.4 [oz_av]

## 2017-08-27 LAB — RESPIRATORY PANEL BY PCR
ADENOVIRUS-RVPPCR: NOT DETECTED
Bordetella pertussis: NOT DETECTED
CORONAVIRUS OC43-RVPPCR: NOT DETECTED
Chlamydophila pneumoniae: NOT DETECTED
Coronavirus 229E: NOT DETECTED
Coronavirus HKU1: NOT DETECTED
Coronavirus NL63: NOT DETECTED
Influenza A H1 2009: NOT DETECTED
Influenza A H1: NOT DETECTED
Influenza A H3: NOT DETECTED
Influenza A: NOT DETECTED
Influenza B: NOT DETECTED
Metapneumovirus: NOT DETECTED
Mycoplasma pneumoniae: NOT DETECTED
PARAINFLUENZA VIRUS 1-RVPPCR: NOT DETECTED
PARAINFLUENZA VIRUS 2-RVPPCR: NOT DETECTED
PARAINFLUENZA VIRUS 3-RVPPCR: NOT DETECTED
PARAINFLUENZA VIRUS 4-RVPPCR: NOT DETECTED
RESPIRATORY SYNCYTIAL VIRUS-RVPPCR: NOT DETECTED
RHINOVIRUS / ENTEROVIRUS - RVPPCR: DETECTED — AB

## 2017-08-27 LAB — BLOOD GAS, ARTERIAL
ACID-BASE EXCESS: 14.1 mmol/L — AB (ref 0.0–2.0)
Acid-Base Excess: 12.2 mmol/L — ABNORMAL HIGH (ref 0.0–2.0)
Bicarbonate: 39.5 mmol/L — ABNORMAL HIGH (ref 20.0–28.0)
Bicarbonate: 40.7 mmol/L — ABNORMAL HIGH (ref 20.0–28.0)
DRAWN BY: 12971
Delivery systems: POSITIVE
Expiratory PAP: 10
FIO2: 60
INSPIRATORY PAP: 20
LHR: 15 {breaths}/min
O2 Content: 3.5 L/min
O2 SAT: 97.7 %
O2 SAT: 86.4 %
PATIENT TEMPERATURE: 98.6
PCO2 ART: 81.1 mmHg — AB (ref 32.0–48.0)
PH ART: 7.321 — AB (ref 7.350–7.450)
PO2 ART: 108 mmHg (ref 83.0–108.0)
Patient temperature: 98.6
pCO2 arterial: 93 mmHg (ref 32.0–48.0)
pH, Arterial: 7.252 — ABNORMAL LOW (ref 7.350–7.450)
pO2, Arterial: 54.4 mmHg — ABNORMAL LOW (ref 83.0–108.0)

## 2017-08-27 LAB — BASIC METABOLIC PANEL
ANION GAP: 7 (ref 5–15)
BUN: 18 mg/dL (ref 6–20)
CO2: 42 mmol/L — ABNORMAL HIGH (ref 22–32)
Calcium: 8.5 mg/dL — ABNORMAL LOW (ref 8.9–10.3)
Chloride: 90 mmol/L — ABNORMAL LOW (ref 101–111)
Creatinine, Ser: 1.08 mg/dL — ABNORMAL HIGH (ref 0.44–1.00)
GFR calc Af Amer: >60 mL/min (ref >60)
GFR, EST NON AFRICAN AMERICAN: 53 mL/min — AB (ref >60)
GLUCOSE: 129 mg/dL — AB (ref 65–99)
POTASSIUM: 4.3 mmol/L (ref 3.5–5.1)
Sodium: 139 mmol/L (ref 135–145)

## 2017-08-27 LAB — GLUCOSE, CAPILLARY
GLUCOSE-CAPILLARY: 166 mg/dL — AB (ref 65–99)
GLUCOSE-CAPILLARY: 153 mg/dL — AB (ref 65–99)
Glucose-Capillary: 184 mg/dL — ABNORMAL HIGH (ref 65–99)
Glucose-Capillary: 118 mg/dL — ABNORMAL HIGH (ref 65–99)
Glucose-Capillary: 130 mg/dL — ABNORMAL HIGH (ref 65–99)

## 2017-08-27 LAB — CBC
HEMATOCRIT: 33.1 % — AB (ref 36.0–46.0)
HEMOGLOBIN: 8.9 g/dL — AB (ref 12.0–15.0)
MCH: 22 pg — ABNORMAL LOW (ref 26.0–34.0)
MCHC: 26.9 g/dL — AB (ref 30.0–36.0)
MCV: 81.7 fL (ref 78.0–100.0)
PLATELETS: 282 10*3/uL (ref 150–400)
RBC: 4.05 MIL/uL (ref 3.87–5.11)
RDW: 18.1 % — AB (ref 11.5–15.5)
WBC: 10 10*3/uL (ref 4.0–10.5)

## 2017-08-27 MED ORDER — DILTIAZEM HCL ER COATED BEADS 240 MG PO CP24
240.0000 mg | ORAL_CAPSULE | Freq: Every day | ORAL | Status: DC
Start: 2017-08-27 — End: 2017-08-30
  Administered 2017-08-27 – 2017-08-30 (×4): 240 mg via ORAL
  Filled 2017-08-27 (×5): qty 1

## 2017-08-27 MED ORDER — FUROSEMIDE 10 MG/ML IJ SOLN
40.0000 mg | Freq: Three times a day (TID) | INTRAMUSCULAR | Status: AC
  Administered 2017-08-27 (×2): 40 mg via INTRAVENOUS
  Filled 2017-08-27 (×2): qty 4

## 2017-08-27 MED ORDER — MUPIROCIN 2 % EX OINT
1.0000 "application " | TOPICAL_OINTMENT | Freq: Two times a day (BID) | CUTANEOUS | Status: DC
  Administered 2017-08-27 – 2017-08-30 (×7): 1 via NASAL
  Filled 2017-08-27 (×3): qty 22

## 2017-08-27 MED ORDER — CHLORHEXIDINE GLUCONATE CLOTH 2 % EX PADS
6.0000 | MEDICATED_PAD | Freq: Every day | CUTANEOUS | Status: DC
  Administered 2017-08-27 – 2017-08-30 (×4): 6 via TOPICAL

## 2017-08-27 MED ORDER — LORATADINE 10 MG PO TABS
10.0000 mg | ORAL_TABLET | Freq: Every day | ORAL | Status: DC
Start: 2017-08-27 — End: 2017-08-30
  Administered 2017-08-27 – 2017-08-30 (×4): 10 mg via ORAL
  Filled 2017-08-27 (×4): qty 1

## 2017-08-27 MED ORDER — SERTRALINE HCL 100 MG PO TABS
100.0000 mg | ORAL_TABLET | Freq: Every day | ORAL | Status: DC
Start: 2017-08-27 — End: 2017-08-30
  Administered 2017-08-27 – 2017-08-30 (×4): 100 mg via ORAL
  Filled 2017-08-27 (×2): qty 1
  Filled 2017-08-27: qty 2
  Filled 2017-08-27: qty 1

## 2017-08-27 MED ORDER — GABAPENTIN 100 MG PO CAPS
100.0000 mg | ORAL_CAPSULE | Freq: Every day | ORAL | Status: DC
  Administered 2017-08-27 – 2017-08-29 (×4): 100 mg via ORAL
  Filled 2017-08-27 (×4): qty 1

## 2017-08-27 MED ORDER — FAMOTIDINE 20 MG PO TABS
20.0000 mg | ORAL_TABLET | Freq: Two times a day (BID) | ORAL | Status: DC
  Administered 2017-08-27 – 2017-08-30 (×6): 20 mg via ORAL
  Filled 2017-08-27 (×6): qty 1

## 2017-08-27 MED ORDER — PROCHLORPERAZINE MALEATE 10 MG PO TABS
10.0000 mg | ORAL_TABLET | Freq: Four times a day (QID) | ORAL | Status: DC | PRN
  Filled 2017-08-27: qty 1

## 2017-08-27 MED ORDER — PERFLUTREN LIPID MICROSPHERE
1.0000 mL | INTRAVENOUS | Status: AC | PRN
  Filled 2017-08-27: qty 10

## 2017-08-27 MED ORDER — PERFLUTREN LIPID MICROSPHERE
INTRAVENOUS | Status: AC
  Administered 2017-08-27: 13:00:00
  Filled 2017-08-27: qty 10

## 2017-08-27 MED ORDER — FAMOTIDINE 20 MG PO TABS
20.0000 mg | ORAL_TABLET | Freq: Every day | ORAL | Status: DC
Start: 2017-08-27 — End: 2017-08-27
  Administered 2017-08-27: 20 mg via ORAL
  Filled 2017-08-27: qty 1

## 2017-08-27 MED ORDER — NON FORMULARY: 150.0000 mg | Freq: Two times a day (BID) | Status: DC

## 2017-08-27 MED ORDER — KETOTIFEN FUMARATE 0.025 % OP SOLN
1.0000 [drp] | Freq: Two times a day (BID) | OPHTHALMIC | Status: DC
  Administered 2017-08-27 – 2017-08-30 (×6): 1 [drp] via OPHTHALMIC
  Filled 2017-08-27 (×3): qty 5

## 2017-08-27 NOTE — Progress Notes (Signed)
Patient insisted being off BiPAP because she sleeps during the day and is up at night. Will reattempt when possible, but will also pass along note of her wearing BiPAP today during naps as well.

## 2017-08-27 NOTE — Progress Notes (Signed)
Patient is being transferred to [place]. Report called to the receiving RN.

## 2017-08-27 NOTE — Consult Note (Signed)
Ref: Dixie Dials, MD   Subjective:  Breathing improving. Admitted for hypercarbic respiratory failure.  Objective:  Vital Signs in the last 24 hours: Temp:  [98 F (36.7 C)-98.6 F (37 C)] 98 F (36.7 C) (09/17 0800) Pulse Rate:  [54-145] 103 (09/17 1029) Cardiac Rhythm: Atrial fibrillation (09/17 0800) Resp:  [10-25] 17 (09/17 1000) BP: (85-153)/(53-99) 110/99 (09/17 1029) SpO2:  [88 %-100 %] 94 % (09/17 1000) FiO2 (%):  [40 %-50 %] 40 % (09/17 0054) Weight:  [144 kg (317 lb 7.4 oz)-145.4 kg (320 lb 8.8 oz)] 144 kg (317 lb 7.4 oz) (09/17 0500)  Physical Exam: BP Readings from Last 1 Encounters:  08/27/17 (!) 110/99    Wt Readings from Last 1 Encounters:  08/27/17 (!) 144 kg (317 lb 7.4 oz)    Weight change: 9.321 kg (20 lb 8.8 oz) Body mass index is 52.83 kg/m. HEENT: [place]/AT, Eyes-Blue, PERL, EOMI, Conjunctiva-Pink, Sclera-Non-icteric Neck: + JVD, No bruit, Trachea midline. Lungs:  Clearing, Bilateral. Cardiac:  Regular rhythm, normal S1 and S2, no S3. II/VI systolic murmur. Abdomen:  Soft, non-tender. BS present. Extremities:  1 + edema present. No cyanosis. No clubbing. CNS: AxOx3, Cranial nerves grossly intact, moves all 4 extremities.  Skin: Warm and dry.   Intake/Output from previous day: 09/16 0701 - 09/17 0700 In: 1218 [P.O.:800; I.V.:3; IV Piggyback:415] Out: 0960 [Urine:1450]    Lab Results: BMET    Component Value Date/Time   NA 139 08/27/2017 0144   NA 137 08/26/2017 0233   NA 139 08/26/2017 0231   K 4.3 08/27/2017 0144   K 4.8 08/26/2017 0233   K 4.8 08/26/2017 0231   CL 90 (L) 08/27/2017 0144   CL 94 (L) 08/26/2017 0233   CL 92 (L) 08/26/2017 0231   CO2 42 (H) 08/27/2017 0144   CO2 32 08/26/2017 0233   CO2 42 (H) 07/11/2017 0334   GLUCOSE 129 (H) 08/27/2017 0144   GLUCOSE 172 (H) 08/26/2017 0233   GLUCOSE 186 (H) 08/26/2017 0231   BUN 18 08/27/2017 0144   BUN 13 08/26/2017 0233   BUN 18 08/26/2017 0231   CREATININE 1.08 (H) 08/27/2017  0144   CREATININE 0.99 08/26/2017 0233   CREATININE 1.00 08/26/2017 0231   CALCIUM 8.5 (L) 08/27/2017 0144   CALCIUM 8.5 (L) 08/26/2017 0233   CALCIUM 8.5 (L) 07/11/2017 0334   GFRNONAA 53 (L) 08/27/2017 0144   GFRNONAA 59 (L) 08/26/2017 0233   GFRNONAA 55 (L) 07/11/2017 0334   GFRAA >60 08/27/2017 0144   GFRAA >60 08/26/2017 0233   GFRAA >60 07/11/2017 0334   CBC    Component Value Date/Time   WBC 10.0 08/27/2017 0144   RBC 4.05 08/27/2017 0144   HGB 8.9 (L) 08/27/2017 0144   HCT 33.1 (L) 08/27/2017 0144   PLT 282 08/27/2017 0144   MCV 81.7 08/27/2017 0144   MCH 22.0 (L) 08/27/2017 0144   MCHC 26.9 (L) 08/27/2017 0144   RDW 18.1 (H) 08/27/2017 0144   LYMPHSABS 0.6 (L) 08/25/2017 2240   MONOABS 0.2 08/25/2017 2240   EOSABS 0.0 08/25/2017 2240   BASOSABS 0.0 08/25/2017 2240   HEPATIC Function Panel  Recent Labs  07/05/17 0637 08/26/17 0233  PROT 7.2 8.2*   HEMOGLOBIN A1C No components found for: HGA1C,  MPG CARDIAC ENZYMES Lab Results  Component Value Date   CKTOTAL 96 11/12/2007   CKMB 1.8 11/12/2007   TROPONINI <0.03 08/26/2017   TROPONINI <0.03 08/26/2017   TROPONINI <0.03 08/26/2017   BNP No  results for input(s): PROBNP in the last 8760 hours. TSH  Recent Labs  07/02/17 1743  TSH 3.802   CHOLESTEROL No results for input(s): CHOL in the last 8760 hours.  Scheduled Meds: . buprenorphine-naloxone  1 tablet Sublingual BID  . Chlorhexidine Gluconate Cloth  6 each Topical Q0600  . diltiazem  240 mg Oral Daily  . enoxaparin (LOVENOX) injection  40 mg Subcutaneous Daily  . famotidine  20 mg Oral Daily  . furosemide  40 mg Intravenous Q8H  . gabapentin  100 mg Oral QHS  . insulin aspart  0-15 Units Subcutaneous Q4H  . ipratropium-albuterol  3 mL Nebulization QID  . levothyroxine  75 mcg Oral QAC breakfast  . loratadine  10 mg Oral Daily  . metoprolol tartrate  50 mg Oral BID  . mupirocin ointment  1 application Nasal BID  . pantoprazole (PROTONIX)  IV  40 mg Intravenous Daily  . sertraline  100 mg Oral Daily  . sodium chloride flush  3 mL Intravenous Q12H   Continuous Infusions: . sodium chloride    .  ceFAZolin (ANCEF) IV Stopped (08/27/17 0630)   PRN Meds:.sodium chloride, acetaminophen, ipratropium-albuterol, sodium chloride flush  Assessment/Plan: Acute on chronic hypercarbic respiratory failure OSA on CPAP Acute on chronic preserved LV systolic function heart failure CAD Hypertension DM, II Chronic atrial fibrillation Morbid obesity GERD H/O endometrial carcinoma Cellulitis of both lower legs Left buttocks healing pressure ulcer  Continue medical treatment. Review medications..   LOS: 1 day    Dixie Dials  MD  08/27/2017, 10:36 AM

## 2017-08-27 NOTE — Consult Note (Addendum)
[place] Nurse wound consult note Reason for Consult: buttock Wound type:small healing area left buttock Pressure Injury POA: Yes Measurement:0.2cm x 0.1cm x 0.1cm  Wound WKM:QKMMN, pink Drainage (amount, consistency, odor) none Periwound:intact with evidence of healing, skin color change from re- epithelialization  Dressing procedure/placement/frequency:none needed at this time SPORT mattress in use while in the ICU  Prophylactic dressing for the sacrum in use currently   Discussed POC with patient and bedside nurse.  Re consult if needed, will not follow at this time. Thanks  Macedonio Scallon [employer], RN,CWOCN, CNS, [place] [phone])

## 2017-08-27 NOTE — Progress Notes (Signed)
eLink Physician-Brief Progress Note Patient Name: [name] DOB: 09-21-1956 MRN: [id number]   Date of Service  08/27/2017  HPI/Events of Note  Nurse report patient complaining of leg pain & requesting nighttime Neurontin. Patient also listed Compazine as something she takes routinely at night as well. Patient currently on noninvasive positive pressure ventilation and ICU status her altered mentation.   eICU Interventions  1. Reordering  Neurontin 100 mg by mouth daily at bedtime 2. Holding home Compazine      Intervention Category Major Interventions: Other:  Tera Partridge 08/27/2017, 1:15 AM

## 2017-08-27 NOTE — Progress Notes (Signed)
PULMONARY / CRITICAL CARE MEDICINE   Name: [name] MRN: [id number] DOB: 1956/02/08    ADMISSION DATE:  08/25/2017 CONSULTATION DATE:  08/26/17  REFERRING MD:  Dr. Wynetta Emery / [facility]   CHIEF COMPLAINT:  Acute Hypercarbic Respiratory Failure   HISTORY OF PRESENT ILLNESS:   61 y/o F who presented to [facility] ER on 9/15 via EMS from home with reports of "being sick" for one week.  The patient was found to be hypoxic with sats of 85%.  She was treated with O2, duonebs and solumedrol in route to ER.    At baseline, the patient lives at home with the help of her daughter.  She is mostly bed to bedside commode bound and has an ulcer on her bottom.   She has a medical history of chronic hypoxic respiratory failure on 3.5L with hypercapnia, OSA on CPAP, RLS, systolic / diastolic CHF (LVEF 29-92%, grade 1 diastolic dysfunction 03/2682), DM, GERD, CAD, PAF. He reports she recently had her oxygen assessed at home and O2 was accidentally decreased for some time. She thinks that she was on 1-2 L over the last week before she realized it needed to be turned up.  Patient reports increased cough and swelling in lower extremities. She also states that she has not been taking her Lasix as prescribed because it is so difficult for her to get up and down to the bathroom.  She also has noted an increase in her baseline "shaking".  She feels she needs more help at home.    Initial ABG 7.205 / 101 / 61 / 38.  She was placed on BiPAP.  Initial CXR showed low lung volumes & concern for CHF.  She was encephalopathic.  Follow up ABG 7.241 / 92 / 113 / 38.5.  The patient was admitted per [facility] for further care.  She received ativan in the ER.  In addition, she takes suboxone BID.   PCCM consulted for evaluation of acute on chronic hypercarbic respiratory failure.   SUBJECTIVE:   No events overnight, off BiPAP since 6 AM  VITAL SIGNS: BP 100/66   Pulse 79   Temp 98 F (36.7 C) (Oral)   Resp 11   Ht 5\' 5"  (1.651 m)   Wt (!)  144 kg (317 lb 7.4 oz)   SpO2 96%   BMI 52.83 kg/m   HEMODYNAMICS:    VENTILATOR SETTINGS: Vent Mode: BIPAP FiO2 (%):  [40 %-50 %] 40 % Set Rate:  [10 bmp-15 bmp] 10 bmp PEEP:  [5 cmH20] 5 cmH20  INTAKE / OUTPUT: I/O last 3 completed shifts: In: 1218 [P.O.:800; I.V.:3; IV Piggyback:415] Out: 4196 [Urine:1450]  PHYSICAL EXAMINATION: General: morbidly obese female, NAD, off BiPAP HEENT: [place]/AT, PERRL, EOM-I and MMM PSY: Calm/appropriate  Neuro: Arousable, moving all ext to command CV: RRR, Nl S1/S2, -M/R/G. PULM: Bibasilar crackles GI: Soft, NT, ND and +BS Extremities: warm/dry, 1-2+ pitting BLE edema, clubbing of nailbeds noted Skin: no rashes or lesions  LABS:  BMET  Recent Labs Lab 08/26/17 0231 08/26/17 0233 08/27/17 0144  NA 139 137 139  K 4.8 4.8 4.3  CL 92* 94* 90*  CO2  --  32 42*  BUN 18 13 18   CREATININE 1.00 0.99 1.08*  GLUCOSE 186* 172* 129*    Electrolytes  Recent Labs Lab 08/26/17 0233 08/27/17 0144  CALCIUM 8.5* 8.5*    CBC  Recent Labs Lab 08/25/17 2240 08/26/17 0231 08/27/17 0144  WBC 9.4  --  10.0  HGB  10.0* 12.9 8.9*  HCT 37.1 38.0 33.1*  PLT 237  --  282    Coag's No results for input(s): APTT, INR in the last 168 hours.  Sepsis Markers  Recent Labs Lab 08/26/17 0231 08/26/17 1339 08/27/17 0144  LATICACIDVEN 0.80  --   --   PROCALCITON  --  <0.10 <0.10    ABG  Recent Labs Lab 08/26/17 0309 08/26/17 0900 08/26/17 1810  PHART 7.241* 7.252* 7.321*  PCO2ART 92.8* 93.0* 81.1*  PO2ART 113* 108 54.4*    Liver Enzymes  Recent Labs Lab 08/26/17 0233  AST 17  ALT 10*  ALKPHOS 96  BILITOT 0.5  ALBUMIN 3.3*    Cardiac Enzymes  Recent Labs Lab 08/26/17 0233 08/26/17 1339 08/26/17 1540  TROPONINI <0.03 <0.03 <0.03    Glucose  Recent Labs Lab 08/26/17 2255 08/27/17 0346 08/27/17 0838  GLUCAP 167* 184* 118*    Imaging Dg Chest Port 1 View  Result Date: 08/27/2017 CLINICAL DATA:   61 year old female with acute respiratory failure. Admitted yesterday with shortness of Breath, on home oxygen EXAM: PORTABLE CHEST 1 VIEW COMPARISON:  08/25/2017 and earlier. FINDINGS: Portable AP upright view at 0602 hours. Mildly improved lung volumes and bilateral ventilation. Decreased patchy perihilar and bilateral pulmonary interstitial opacity. No pneumothorax. Trace fluid or thickening along the right minor fissure. No consolidation. Stable cardiac size and mediastinal contours. IMPRESSION: 1. Improved lung volumes and bilateral ventilation since yesterday. Regressed bilateral perihilar and interstitial opacity which could reflect improving edema or infection. 2. No new cardiopulmonary abnormality. Electronically Signed   By: Genevie Ann M.D.   On: 08/27/2017 07:33     STUDIES:  ECHO 9/16 >>  UDS 9/16 >>   CULTURES: BCx2 9/16 >>  RVP 9/16 >>   ANTIBIOTICS:   SIGNIFICANT EVENTS: 9/16  Admit with hypercarbic respiratory failure / decompensated CHF   LINES/TUBES:  I reviewed CXR myself, pulmonary edema noted  DISCUSSION: 61 year old female admitted with hypercarbic respiratory failure and decompensated CHF. She recently has missed several doses of Lasix and her oxygen was accidentally decreased to 1-2L.  Discussed with PCCM-NP and bedside RN and [facility]-MD  ASSESSMENT / PLAN:  PULMONARY A: Acute on Chronic Hypercarbic Respiratory Failure  OSA on CPAP  Chronic Hypoxic Respiratory Failure  Pulmonary Edema  P:   Transfer to SDU and back to [facility] service with PCCM off 9/18 D/C BiPAP CPAP QHS Duoneb Q6 Pulmonary hygiene - IS, mobilize Lasix as below Minimize sedating medications   CARDIOVASCULAR A:  Acute Decompensated CHF  Chronic Systolic / Diastolic CHF  Hx PAF (not on anticoagulation due to prior bleeding), CAD, HTN P:  SDU monitoring Resume home cardizem Assess ECHO - pending Strict I/O's  RENAL A:   No Acute issues  P:   Trend BMP / urinary output Replace  electrolytes as indicated Avoid nephrotoxic agents, ensure adequate renal perfusion Lasix 40 mg IV q8 x2 doses now then in AM change to home dose of 80 mg PO daily.  GASTROINTESTINAL A:   Morbid Obesity  GERD  P:   Heart healthy carb modified diet PPI   HEMATOLOGIC A:   Anemia - suspect of chronic disease, baseline 10, no bleeding  P:  Trend CBC Lovenox for DVT prophylaxis    INFECTIOUS A:   Decubitus Ulcer - sacral, present on admit  Rule Out Infectious Process  P:   Assess blood cultures  Assess viral panel pending Trend PCT WOC consult   ENDOCRINE A:  Hypothyroidism  DM with Peripheral Neuropathy - not on oral medications or insulin, last A1c 6.7   P:   Monitor  Synthroid IV, switch to PO in AM if able to tolerate PO  NEUROLOGIC A:   Acute Encephalopathy - in setting of hypercarbic respiratory failure Chronic Pain  P:   RASS goal: n/a Limit sedating medications Continue suboxone  FAMILY  - Updates: Patient updated on plan of care 9/17, transfer to SDU and to [facility] service with PCCM off 9/18.  - Inter-disciplinary family meet or Palliative Care meeting due by:  9/23  Rush Farmer, M.D. [facility] Pulmonary/Critical Care Medicine. Pager: [phone]. After hours pager: [phone].

## 2017-08-27 NOTE — Progress Notes (Signed)
  Echocardiogram 2D Echocardiogram has been performed.  [name] 08/27/2017, 1:04 PM

## 2017-08-28 DIAGNOSIS — I5042 Chronic combined systolic (congestive) and diastolic (congestive) heart failure: Secondary | ICD-10-CM

## 2017-08-28 DIAGNOSIS — I482 Chronic atrial fibrillation: Secondary | ICD-10-CM

## 2017-08-28 DIAGNOSIS — J441 Chronic obstructive pulmonary disease with (acute) exacerbation: Secondary | ICD-10-CM

## 2017-08-28 DIAGNOSIS — G894 Chronic pain syndrome: Secondary | ICD-10-CM

## 2017-08-28 DIAGNOSIS — B348 Other viral infections of unspecified site: Secondary | ICD-10-CM

## 2017-08-28 DIAGNOSIS — G9340 Encephalopathy, unspecified: Secondary | ICD-10-CM

## 2017-08-28 DIAGNOSIS — E662 Morbid (severe) obesity with alveolar hypoventilation: Secondary | ICD-10-CM

## 2017-08-28 LAB — BASIC METABOLIC PANEL
ANION GAP: 10 (ref 5–15)
BUN: 23 mg/dL — ABNORMAL HIGH (ref 6–20)
CALCIUM: 8.3 mg/dL — AB (ref 8.9–10.3)
CHLORIDE: 85 mmol/L — AB (ref 101–111)
CO2: 44 mmol/L — AB (ref 22–32)
Creatinine, Ser: 1.05 mg/dL — ABNORMAL HIGH (ref 0.44–1.00)
GFR calc non Af Amer: 55 mL/min — ABNORMAL LOW (ref >60)
GLUCOSE: 145 mg/dL — AB (ref 65–99)
Potassium: 3.3 mmol/L — ABNORMAL LOW (ref 3.5–5.1)
Sodium: 139 mmol/L (ref 135–145)

## 2017-08-28 LAB — BLOOD GAS, ARTERIAL
ACID-BASE EXCESS: 22.6 mmol/L — AB (ref 0.0–2.0)
BICARBONATE: 49.6 mmol/L — AB (ref 20.0–28.0)
DRAWN BY: 511551
O2 Content: 4 L/min
O2 SAT: 92.6 %
PATIENT TEMPERATURE: 98.6
pCO2 arterial: 93.3 mmHg (ref 32.0–48.0)
pH, Arterial: 7.346 — ABNORMAL LOW (ref 7.350–7.450)
pO2, Arterial: 69 mmHg — ABNORMAL LOW (ref 83.0–108.0)

## 2017-08-28 LAB — POTASSIUM: POTASSIUM: 3.8 mmol/L (ref 3.5–5.1)

## 2017-08-28 LAB — MAGNESIUM: MAGNESIUM: 1.6 mg/dL — AB (ref 1.7–2.4)

## 2017-08-28 LAB — PROCALCITONIN: Procalcitonin: <0.1 ng/mL

## 2017-08-28 MED ORDER — GUAIFENESIN-DM 100-10 MG/5ML PO SYRP
5.0000 mL | ORAL_SOLUTION | ORAL | Status: DC | PRN
  Administered 2017-08-28 (×2): 5 mL via ORAL
  Filled 2017-08-28 (×2): qty 5

## 2017-08-28 MED ORDER — POTASSIUM CHLORIDE ER 10 MEQ PO TBCR
10.0000 meq | EXTENDED_RELEASE_TABLET | Freq: Two times a day (BID) | ORAL | Status: DC
  Administered 2017-08-29: 10 meq via ORAL
  Filled 2017-08-28 (×2): qty 1

## 2017-08-28 MED ORDER — POTASSIUM CHLORIDE CRYS ER 20 MEQ PO TBCR
60.0000 meq | EXTENDED_RELEASE_TABLET | Freq: Once | ORAL | Status: AC
  Administered 2017-08-28: 60 meq via ORAL
  Filled 2017-08-28: qty 3

## 2017-08-28 MED ORDER — INSULIN ASPART 100 UNIT/ML ~~LOC~~ SOLN
0.0000 [IU] | Freq: Three times a day (TID) | SUBCUTANEOUS | Status: DC
  Administered 2017-08-29: 5 [IU] via SUBCUTANEOUS
  Administered 2017-08-29: 3 [IU] via SUBCUTANEOUS
  Administered 2017-08-29: 5 [IU] via SUBCUTANEOUS
  Administered 2017-08-30 (×2): 3 [IU] via SUBCUTANEOUS
  Administered 2017-08-30: 5 [IU] via SUBCUTANEOUS

## 2017-08-28 MED ORDER — LEVALBUTEROL HCL 1.25 MG/0.5ML IN NEBU
1.2500 mg | INHALATION_SOLUTION | Freq: Three times a day (TID) | RESPIRATORY_TRACT | Status: DC
  Administered 2017-08-28: 1.25 mg via RESPIRATORY_TRACT
  Filled 2017-08-28: qty 0.5

## 2017-08-28 MED ORDER — LIVING BETTER WITH HEART FAILURE BOOK
Freq: Once | Status: AC
  Administered 2017-08-28: 06:00:00

## 2017-08-28 MED ORDER — GUAIFENESIN ER 600 MG PO TB12
600.0000 mg | ORAL_TABLET | Freq: Two times a day (BID) | ORAL | Status: DC
  Administered 2017-08-28 – 2017-08-30 (×5): 600 mg via ORAL
  Filled 2017-08-28 (×5): qty 1

## 2017-08-28 MED ORDER — IPRATROPIUM-ALBUTEROL 0.5-2.5 (3) MG/3ML IN SOLN
3.0000 mL | Freq: Three times a day (TID) | RESPIRATORY_TRACT | Status: DC
  Administered 2017-08-28: 3 mL via RESPIRATORY_TRACT
  Filled 2017-08-28 (×2): qty 3

## 2017-08-28 MED ORDER — MAGNESIUM SULFATE 2 GM/50ML IV SOLN
2.0000 g | Freq: Once | INTRAVENOUS | Status: AC
  Administered 2017-08-28: 2 g via INTRAVENOUS
  Filled 2017-08-28: qty 50

## 2017-08-28 MED ORDER — POTASSIUM CHLORIDE ER 10 MEQ PO TBCR
20.0000 meq | EXTENDED_RELEASE_TABLET | Freq: Three times a day (TID) | ORAL | Status: DC
  Filled 2017-08-28: qty 2

## 2017-08-28 MED ORDER — POTASSIUM CHLORIDE CRYS ER 20 MEQ PO TBCR
40.0000 meq | EXTENDED_RELEASE_TABLET | Freq: Once | ORAL | Status: AC
  Administered 2017-08-28: 40 meq via ORAL
  Filled 2017-08-28: qty 2

## 2017-08-28 MED ORDER — METHYLPREDNISOLONE SODIUM SUCC 125 MG IJ SOLR
60.0000 mg | INTRAMUSCULAR | Status: DC
  Administered 2017-08-28: 60 mg via INTRAVENOUS
  Filled 2017-08-28: qty 2

## 2017-08-28 MED ORDER — FUROSEMIDE 10 MG/ML IJ SOLN
60.0000 mg | Freq: Three times a day (TID) | INTRAMUSCULAR | Status: DC
  Administered 2017-08-28 – 2017-08-29 (×4): 60 mg via INTRAVENOUS
  Filled 2017-08-28 (×5): qty 6

## 2017-08-28 NOTE — Evaluation (Signed)
Physical Therapy Evaluation Patient Details Name: [name] MRN: [id number] DOB: 03/15/1956 Today's Date: 08/28/2017   History of Present Illness  [name] is a 61 y.o. female with medical history significant of chronic respiratory failure with hypercapnia, systolic and diastolic CHF, DM type II, CAD, hypothyroidism, chronic pain, morbid obesity, and OSA on CPAP; who presents with complaints of shortness of breath. Patient is normally on 4 L of oxygen 24/7.  Clinical Impression  Pt admitted with above diagnosis. Pt currently with functional limitations due to the deficits listed below (see PT Problem List). Pt limited in her mobility by deconditioning and bilateral knee pain with weightbearing. Pt reports having increased difficulty with incontinence and getting to [facility] without having an accident, as a result pt is very impulsive with her movement and does not take adequate time to assess her stability before moving putting her at high risk for falls.  Pt will benefit from skilled PT to increase their independence and safety with mobility to allow discharge to the venue listed below.       Follow Up Recommendations SNF    Equipment Recommendations  Other (comment) (to be dtermined at next venue)    Recommendations for Other Services OT consult     Precautions / Restrictions Precautions Precautions: Fall Restrictions Weight Bearing Restrictions: No      Mobility  Bed Mobility Overal bed mobility: Needs Assistance Bed Mobility: Supine to Sit     Supine to sit: HOB elevated;Min guard     General bed mobility comments: min guard for safety, vc for use of handrail to pull to upright and scoot to EoB  Transfers Overall transfer level: Needs assistance Equipment used: Rolling walker (2 wheeled) Transfers: Sit to/from Stand Sit to Stand: Min assist         General transfer comment: minA for power up and steadying in standing, vc for hand  placement  Ambulation/Gait Ambulation/Gait assistance: Min assist Ambulation Distance (Feet): 2 Feet Assistive device: Rolling walker (2 wheeled) Gait Pattern/deviations: Step-to pattern;Shuffle;Trunk flexed;Decreased step length - right;Decreased step length - left;Antalgic Gait velocity: slowed Gait velocity interpretation: Below normal speed for age/gender General Gait Details: minA for steadying in RW, vc for upright posture, pt reports extreme pain in L knee and fear of buckling, knees did not buckle in stepping to chair to sit      Balance Overall balance assessment: Needs assistance Sitting-balance support: Feet supported Sitting balance-Leahy Scale: Fair     Standing balance support: Bilateral upper extremity supported Standing balance-Leahy Scale: Poor Standing balance comment: requires RW for maintaining static balance                             Pertinent Vitals/Pain Pain Assessment: 0-10 Pain Score: 8  Pain Location: bilateral knee pain with movement Pain Descriptors / Indicators: Aching;Burning;Constant Pain Intervention(s): Limited activity within patient's tolerance;Monitored during session    Home Living Family/patient expects to be discharged to:: Private residence Living Arrangements: Children;Other relatives Available Help at Discharge: Available PRN/intermittently Type of Home: House Home Access: Ramped entrance     Home Layout: One level Home Equipment: Burns - 2 wheels;Wheelchair - Education officer, community - power      Prior Function Level of Independence: Needs assistance   Gait / Transfers Assistance Needed: amb to bathroom with RW, although mostly transfers to [facility] for the last month  ADL's / Homemaking Assistance Needed: needs assist for bathing and dressing, as well as iADLs  Comments: spends all of her time in her bedroom      Hand Dominance   Dominant Hand: Right    Extremity/Trunk Assessment   Upper Extremity  Assessment Upper Extremity Assessment: Defer to OT evaluation    Lower Extremity Assessment Lower Extremity Assessment: RLE deficits/detail;LLE deficits/detail RLE Deficits / Details: ROM limited by body habitis, strength grossly 3/5 LLE Deficits / Details: ROM limited by body habitis, strength grossly 3/5       Communication   Communication: No difficulties  Cognition Arousal/Alertness: Awake/alert Behavior During Therapy: WFL for tasks assessed/performed Overall Cognitive Status: Within Functional Limits for tasks assessed                                        General Comments General comments (skin integrity, edema, etc.): VSS, Pt on 4 L O2 via nasal cannula, SaO2 maintained above 95%O2 throughout session, max HR with activity 115bpm    Exercises     Assessment/Plan    PT Assessment Patient needs continued PT services  PT Problem List Decreased strength;Decreased range of motion;Decreased activity tolerance;Decreased balance;Decreased mobility;Decreased safety awareness;Cardiopulmonary status limiting activity;Pain       PT Treatment Interventions DME instruction;Gait training;Functional mobility training;Therapeutic activities;Therapeutic exercise;Balance training;Patient/family education    PT Goals (Current goals can be found in the Care Plan section)  Acute Rehab PT Goals Patient Stated Goal: get stronger PT Goal Formulation: With patient Time For Goal Achievement: 09/11/17 Potential to Achieve Goals: Fair    Frequency Min 3X/week   Barriers to discharge Decreased caregiver support         AM-PAC PT "6 Clicks" Daily Activity  Outcome Measure Difficulty turning over in bed (including adjusting bedclothes, sheets and blankets)?: A Lot Difficulty moving from lying on back to sitting on the side of the bed? : A Lot Difficulty sitting down on and standing up from a chair with arms (e.g., wheelchair, bedside commode, etc,.)?: A Little Help needed  moving to and from a bed to chair (including a wheelchair)?: A Little Help needed walking in hospital room?: A Lot Help needed climbing 3-5 steps with a railing? : Total 6 Click Score: 13    End of Session Equipment Utilized During Treatment: Gait belt;Oxygen Activity Tolerance: Patient limited by fatigue;Patient limited by pain Patient left: in chair;with call bell/phone within reach Nurse Communication: Mobility status PT Visit Diagnosis: Unsteadiness on feet (R26.81);Other abnormalities of gait and mobility (R26.89);Muscle weakness (generalized) (M62.81);Difficulty in walking, not elsewhere classified (R26.2);Pain Pain - Right/Left:  (bilateral knees L>R) Pain - part of body: Knee    Time: 1010-1035 PT Time Calculation (min) (ACUTE ONLY): 25 min   Charges:   PT Evaluation $PT Eval Moderate Complexity: 1 Mod PT Treatments $Gait Training: 8-22 mins   PT G Codes:        Verlena Marlette B. Migdalia Dk PT, DPT Acute Rehabilitation  [phone] Pager [phone]    [place] 08/28/2017, 10:55 AM

## 2017-08-28 NOTE — NC FL2 (Signed)
[place] MEDICAID FL2 LEVEL OF CARE SCREENING TOOL     IDENTIFICATION  Patient Name: [name] Birthdate: 07-26-1956 Sex: female Admission Date (Current Location): 08/25/2017  [facility] and [place] Number:  Herbalist and Address:  [facility] [place], [place] [address], [place], [place] [address]      Provider Number: [id number]  Attending Physician Name and Address:  Allie Bossier, MD  Relative Name and Phone Number:    Aleen Sells, daughter, [phone]    Current Level of Care: Hospital Recommended Level of Care: [place] Prior Approval Number:    Date Approved/Denied: 08/28/17 PASRR Number: [id number]  Discharge Plan: SNF    Current Diagnoses: Patient Active Problem List   Diagnosis Date Noted  . Acute on chronic respiratory failure with hypoxia and hypercapnia ([place]) 08/26/2017  . Hypothyroidism 08/26/2017  . Hypochromic anemia 08/26/2017  . Acute respiratory failure with hypoxia and hypercarbia ([place]) 08/26/2017  . Pressure injury of skin 07/03/2017  . Cellulitis of leg, left 07/02/2017  . Obesity, Class III, BMI 40-49.9 (morbid obesity) ([place]) 07/02/2017  . Acute on chronic combined systolic and diastolic CHF (congestive heart failure) ([place]) 02/10/2016  . Acute respiratory failure with hypoxia and hypercapnia (HCC)   . Seizures ([place])   . Aspiration pneumonia ([place])   . Sepsis ([place])   . Acute on chronic respiratory failure ([place]) 12/25/2015  . Pneumonia 11/09/2014  . CAP (community acquired pneumonia) 10/08/2014  . Atrial fibrillation ([place]) 04/11/2014  . Narcotic overdose 07/07/2013  . Hypoxia 07/07/2013  . Diabetes mellitus ([place]) 07/07/2013  . HTN (hypertension) 07/07/2013  . Chronic pain 07/07/2013  . Hypercapnic respiratory failure, chronic ([place]) 10/17/2011  . OSA (obstructive sleep apnea) 12/23/2007    Orientation RESPIRATION BLADDER Height & Weight     Self, Time, Situation, Place  O2 (Nasal Cannula  1L) Indwelling catheter Weight: (!) 305 lb 1.6 oz (138.4 kg) Height:  5\' 5"  (165.1 cm)  BEHAVIORAL SYMPTOMS/MOOD NEUROLOGICAL BOWEL NUTRITION STATUS      Continent Diet (See DC Summary)  AMBULATORY STATUS COMMUNICATION OF NEEDS Skin   Limited Assist Verbally Normal                       Personal Care Assistance Level of Assistance              Functional Limitations Info             SPECIAL CARE FACTORS FREQUENCY  PT (By licensed PT)     PT Frequency: 3x week              Contractures      Additional Factors Info  Isolation Precautions, Code Status, Allergies Code Status Info: Full code Allergies Info: ASPIRIN, ELIQUIS APIXABAN, NSAIDS, OTHER, PLAVIX CLOPIDOGREL, SUDAFED PSEUDOEPHEDRINE, ERYTHROMYCIN, SULFA ANTIBIOTICS, SULFAMETHOXAZOLE-TRIMETHOPRIM, ALTACE RAMIPRIL, ZOFRAN ONDANSETRON HCL     Isolation Precautions Info: MRSA     Current Medications (08/28/2017):  This is the current hospital active medication list Current Facility-Administered Medications  Medication Dose Route Frequency Provider Last Rate Last Dose  . 0.9 %  sodium chloride infusion  250 mL Intravenous PRN Norval Morton, MD   Stopped at 08/27/17 1604  . acetaminophen (TYLENOL) tablet 650 mg  650 mg Oral Q4H PRN Fuller Plan A, MD      . buprenorphine-naloxone (SUBOXONE) 8-2 mg per SL tablet 1 tablet  1 tablet Sublingual BID Fuller Plan A, MD   1 tablet at 08/28/17  0533  . ceFAZolin (ANCEF) 500 mg in dextrose 5 % 100 mL IVPB  500 mg Intravenous Q8H Charolette Forward, MD 210 mL/hr at 08/28/17 1434 500 mg at 08/28/17 1434  . Chlorhexidine Gluconate Cloth 2 % PADS 6 each  6 each Topical Q0600 Marshell Garfinkel, MD   6 each at 08/28/17 0543  . diltiazem (CARDIZEM CD) 24 hr capsule 240 mg  240 mg Oral Daily Rush Farmer, MD   240 mg at 08/28/17 7425  . enoxaparin (LOVENOX) injection 40 mg  40 mg Subcutaneous Daily Fuller Plan A, MD   40 mg at 08/28/17 0939  . famotidine (PEPCID) tablet 20  mg  20 mg Oral BID Blenda Nicely, RPH   20 mg at 08/28/17 9563  . gabapentin (NEURONTIN) capsule 100 mg  100 mg Oral QHS Javier Glazier, MD   100 mg at 08/27/17 2221  . guaiFENesin (MUCINEX) 12 hr tablet 600 mg  600 mg Oral BID Dixie Dials, MD   600 mg at 08/28/17 1236  . guaiFENesin-dextromethorphan (ROBITUSSIN DM) 100-10 MG/5ML syrup 5 mL  5 mL Oral Q4H PRN Allie Bossier, MD   5 mL at 08/28/17 0949  . insulin aspart (novoLOG) injection 0-15 Units  0-15 Units Subcutaneous Q4H Rigoberto Noel, MD   3 Units at 08/28/17 1236  . ipratropium-albuterol (DUONEB) 0.5-2.5 (3) MG/3ML nebulizer solution 3 mL  3 mL Nebulization Q2H PRN Smith, Rondell A, MD      . ipratropium-albuterol (DUONEB) 0.5-2.5 (3) MG/3ML nebulizer solution 3 mL  3 mL Nebulization TID Allie Bossier, MD   3 mL at 08/28/17 1336  . ketotifen (ZADITOR) 0.025 % ophthalmic solution 1 drop  1 drop Both Eyes BID Dixie Dials, MD   1 drop at 08/28/17 0952  . levothyroxine (SYNTHROID, LEVOTHROID) tablet 75 mcg  75 mcg Oral QAC breakfast Rigoberto Noel, MD   75 mcg at 08/28/17 0820  . loratadine (CLARITIN) tablet 10 mg  10 mg Oral Daily Rush Farmer, MD   10 mg at 08/28/17 8756  . metoprolol tartrate (LOPRESSOR) tablet 50 mg  50 mg Oral BID Rigoberto Noel, MD   50 mg at 08/28/17 4332  . mupirocin ointment (BACTROBAN) 2 % 1 application  1 application Nasal BID Marshell Garfinkel, MD   1 application at 95/18/84 0951  . pantoprazole (PROTONIX) injection 40 mg  40 mg Intravenous Daily Fuller Plan A, MD   40 mg at 08/28/17 0939  . [START ON 08/29/2017] potassium chloride (K-DUR) CR tablet 10 mEq  10 mEq Oral BID Dixie Dials, MD      . prochlorperazine (COMPAZINE) tablet 10 mg  10 mg Oral Q6H PRN Dixie Dials, MD      . sertraline (ZOLOFT) tablet 100 mg  100 mg Oral Daily Rush Farmer, MD   100 mg at 08/28/17 1660  . sodium chloride flush (NS) 0.9 % injection 3 mL  3 mL Intravenous Q12H Smith, Rondell A, MD   3 mL at 08/28/17 0952  .  sodium chloride flush (NS) 0.9 % injection 3 mL  3 mL Intravenous PRN Norval Morton, MD         Discharge Medications: Please see discharge summary for a list of discharge medications.  Relevant Imaging Results:  Relevant Lab Results:   Additional Information SS#:[id number] [place], LCSW

## 2017-08-28 NOTE — Progress Notes (Signed)
Placed patient on CPAP via nasal mask ( pt requested to change mask) 10.0 cm H20 (per unknown home settings) as patient is noncompliant at home. 4 L O2 bleed in.

## 2017-08-28 NOTE — Clinical Social Work Note (Signed)
Clinical Social Work Assessment  Patient Details  Name: [name] MRN: [id number] Date of Birth: 05/02/1956  Date of referral:  08/28/17               Reason for consult:  Facility Placement                Permission sought to share information with:  Facility Art therapist granted to share information::  Yes, Verbal Permission Granted  Name::        Agency::  SNF  Relationship::     Contact Information:     Housing/Transportation Living arrangements for the past 2 months:  Single Family Home Source of Information:  Patient Patient Interpreter Needed:  None Criminal Activity/Legal Involvement Pertinent to Current Situation/Hospitalization:  No - Comment as needed Significant Relationships:  Adult Children, Other Family Members Lives with:  Self, Adult Children Do you feel safe going back to the place where you live?  No Need for family participation in patient care:  No (Coment)  Care giving concerns:  Pt from home with family prior to hospitalization. Pt reports that she has support at home however with new impairment, she will need some short term rehab.  Social Worker assessment / plan:  CSW met with patient at bedside and discussed the clinical team's recommendation for SNF placement at discharge.  CSW explained her role and the SNF process. Pt has never experienced SNF directly but is aware as her spouse was in a facility some time ago. FL2 complete. Passr obtained. Offers sent.   Employment status:  Retired Forensic scientist:  Medicare PT Recommendations:  [place] / Referral to community resources:  [place]  Patient/Family's Response to care:  Patient appreciative of [place] meeting with her to discuss SNF options/placement. No issues or concerns with care.  Patient/Family's Understanding of and Emotional Response to Diagnosis, Current Treatment, and Prognosis:  Patient has good understanding of  diagnosis, current treatment and prognosis. Patient plans to get short term rehab for a few weeks and then return home with home services.  No issues with care of concern identified.  Emotional Assessment Appearance:  Appears older than stated age Attitude/Demeanor/Rapport:   (Cooperative and Friendly) Affect (typically observed):  Accepting, Appropriate Orientation:  Oriented to Self, Oriented to Place, Oriented to  Time, Oriented to Situation Alcohol / Substance use:  Not Applicable Psych involvement (Current and /or in the community):  No (Comment)  Discharge Needs  Concerns to be addressed:  Care Coordination Readmission within the last 30 days:  No Current discharge risk:  Dependent with Mobility, Physical Impairment Barriers to Discharge:  No Barriers Identified   Normajean Baxter, LCSW 08/28/2017, 2:50 PM

## 2017-08-28 NOTE — Progress Notes (Signed)
Paged K. Schorr and advised ABG results are back. pCo2 is 93.3. Discussed Dr. Sherral Hammers orders for IV Lasix and BIPAP at night. No further orders received at this time.

## 2017-08-28 NOTE — Progress Notes (Signed)
PROGRESS NOTE    [name]  HYI:[id number] DOB: 12/03/1956 DOA: 08/25/2017 PCP: Dixie Dials, MD   Brief Narrative:   61 y.o. WF PMHx COPD, Chronic Respiratory Failure with Hypercapnia on 4 L O2 at home,  OSA on CPAP, Chronic Systolic and Diastolic CHF,  CAD,DM type II, Hypothyroidism, Chronic Pain syndrome, morbid obesity,   Presents with complaints of shortness of breath. Patient is normally on 4 L of oxygen 24/7. History is limited due to patient's current mental acuity as she repeatedly falls sleep during evaluation. It appears that the patient had not been feeling well for the last week reporting complaints of cough with yellow sputum production,  and progressively worsening shortness of breath. Patient admits to wheezing intermittently, but does not have home breathing treatments.    Subjective: 9/18 A/O 4, positive acute on chronic SOB (state on 2.5 L O2 at home), negative CP, negative abdominal pain, negative N/V. Patient states has been on narcotics since she was a little child parents used to give her Parafon? States is been on chronic narcotics secondary to multiple back surgeries and bilateral lower knee OA. Currently sees Dr. Luana Shu and [place] in [place] at a pain clinic. Also concern she has memory problems.    Assessment & Plan:   Principal Problem:   Acute on chronic respiratory failure with hypoxia and hypercapnia (HCC) Active Problems:   Diabetes mellitus (HCC)   HTN (hypertension)   Atrial fibrillation (HCC)   Acute on chronic combined systolic and diastolic CHF (congestive heart failure) (HCC)   Obesity, Class III, BMI 40-49.9 (morbid obesity) (HCC)   Hypothyroidism   Hypochromic anemia   Acute respiratory failure with hypoxia and hypercarbia (HCC)  Acute on Chronic Respiratory Failure with Hypoxia and Hypercapnia/OSA and OHS? -Multifactorial, chronic narcotic use, MORBID OBESITY, suspect OSA/OHS, COPD, A. fib with RVR, CHF. - ABG on 7/16 pH 7.32, PCO2 81.1,  PO2 54.4. Given patient's complaint of short-term memory loss and increased difficulty breathing obtain ABG -Given patient's ABG findings and her morbid obesity most likely an aspect of OSA/OHS present.  -BiPAP QHS: Repeat ABG in A.m. -Titrate O2 to maintain SPO2 89 and 93% -Solu-Medrol 60 mg daily -Flutter valve -Mucinex  -Patient will require outpatient sleep study  Positive rhinovirus COPD Exacerbation -DC DuoNeb secondary to A. fib with RVR, start Xopenex TID -DC antibiotics, negative leukocytosis, negative fever -See respiratory failure   Acute encephalopathy:  Likely secondary to above with hypercapnia. - Continue monitoring her status - Will call PCCM if respiratory status or mentation worsens   Chronic pain syndrome -Home medication list reflects patient on Subutex, however UDS negative for opioid.  -Also UDS shows that patient has not been on opioids, started at admission.    Chronic systolic and diastolic CHF -Patient does not know dry weight. PCXR consistent with fluid overload -Strict I&O -Daily weight -Cardizem 240 mg daily -Lasix 60 mg TID: To home dose 80 mg daily -Metoprolol 50 mg BID   Chronic Atrial fibrillation(Chadsvasc score = 4)  -Anticoagulation discontinued secondary to bleeding during last hospitalization 06/2017  -Rate controlled -See CHF  Essential HTN  -see CHF  CAD -See CHF  Diabetes type 2 controlled with peripheral neuropathy -7/23 Hemoglobin A1c= 6.7 -Does not take any medication for diabetes -Moderate SSI     Hypochromic Anemia:Chronic. (baseline  Hg= 9-10)   -Patient presents with a hemoglobin of 10 with low MCH.  -No reported bleeding. - Continue to monitor   Hypothyroidism - Continue Synthroid IV  Morbid obesity BMI 49.92 -Place patient on heart healthy/carb modified 2000-calorie per day diet -Nutrition consulted    GERD - Protonix IV  Hypokalemia -Potassium goal> 4 -K-Dur 60 mEq -Repeat K/Mg @ 1500: Repeat K low,  repeat K Dur 40 mg  Hypomagnesmia -Magnesium goal> 2 -Magnesium IV 2 g      DVT prophylaxis: Lovenox Code Status: Full Family Communication: None Disposition Plan: SNF   Consultants:  [facility] Cardiology  Procedures/Significant Events:  9/17 Echocardiogram: LVEF 55-60%, no wall motion abnormality.     9/16 UDS: Negative for opiates     I have personally reviewed and interpreted all radiology studies and my findings are as above.  VENTILATOR SETTINGS:    Cultures 9/15 blood NGTD   9/16 MRSA by PCR positive 9/16 respiratory virus panel positive rhinovirus    Antimicrobials:Anti-infectives    Start     Stop   08/26/17 0900  ceFAZolin (ANCEF) 500 mg in dextrose 5 % 100 mL IVPB  Status:  Discontinued     08/28/17 2111       Devices None    LINES / TUBES:      Continuous Infusions: . sodium chloride Stopped (08/27/17 1604)  .  ceFAZolin (ANCEF) IV Stopped (08/28/17 7893)     Objective: Vitals:   08/28/17 0244 08/28/17 0532 08/28/17 0720 08/28/17 0817  BP:  114/68  106/63  Pulse: 93  97 98  Resp: 19  16 18   Temp:  98.7 F (37.1 C)  97.9 F (36.6 C)  TempSrc:  Oral  Oral  SpO2: 96% 94% 94% 96%  Weight:      Height:        Intake/Output Summary (Last 24 hours) at 08/28/17 8101 Last data filed at 08/28/17 0020  Gross per 24 hour  Intake              280 ml  Output             5250 ml  Net            -4970 ml   Filed Weights   08/26/17 1630 08/27/17 0500 08/27/17 2117  Weight: (!) 320 lb 8.8 oz (145.4 kg) (!) 317 lb 7.4 oz (144 kg) (!) 305 lb 1.6 oz (138.4 kg)    Examination:  General: A/O 4, positive acute on chronic respiratory distress Neck:  Negative scars, masses, torticollis, lymphadenopathy, JVD Lungs: diffuse poor air movement, positive mild expiratory wheezing diffusely, negative crackles Cardiovascular: Irregular irregular rhythm and rate, without murmur gallop or rub normal S1 and S2 Abdomen: MORBIDLY OBESE, negative  abdominal pain, nondistended, positive soft, bowel sounds, no rebound, no ascites, no appreciable mass Extremities: No significant cyanosis, clubbing, or edema bilateral lower extremities Psychiatric:  Negative depression, negative anxiety, negative fatigue, negative mania, extremely poor understanding of her multiple medical problems. Central nervous system:  Cranial nerves II through XII intact, tongue/uvula midline, all extremities muscle strength 5/5, sensation intact throughout,  negative dysarthria, negative expressive aphasia, negative receptive aphasia.  .     Data Reviewed: Care during the described time interval was provided by me .  I have reviewed this patient's available data, including medical history, events of note, physical examination, and all test results as part of my evaluation.   CBC:  Recent Labs Lab 08/25/17 2240 08/26/17 0231 08/27/17 0144  WBC 9.4  --  10.0  NEUTROABS 8.6*  --   --   HGB 10.0* 12.9 8.9*  HCT 37.1 38.0 33.1*  MCV  83.6  --  81.7  PLT 237  --  737   Basic Metabolic Panel:  Recent Labs Lab 08/26/17 0231 08/26/17 0233 08/27/17 0144 08/28/17 0301  NA 139 137 139 139  K 4.8 4.8 4.3 3.3*  CL 92* 94* 90* 85*  CO2  --  32 42* 44*  GLUCOSE 186* 172* 129* 145*  BUN 18 13 18  23*  CREATININE 1.00 0.99 1.08* 1.05*  CALCIUM  --  8.5* 8.5* 8.3*   GFR: Estimated Creatinine Clearance: 77.6 mL/min (A) (by C-G formula based on SCr of 1.05 mg/dL (H)). Liver Function Tests:  Recent Labs Lab 08/26/17 0233  AST 17  ALT 10*  ALKPHOS 96  BILITOT 0.5  PROT 8.2*  ALBUMIN 3.3*   No results for input(s): LIPASE, AMYLASE in the last 168 hours. No results for input(s): AMMONIA in the last 168 hours. Coagulation Profile: No results for input(s): INR, PROTIME in the last 168 hours. Cardiac Enzymes:  Recent Labs Lab 08/26/17 0041 08/26/17 0233 08/26/17 1339 08/26/17 1540  TROPONINI <0.03 <0.03 <0.03 <0.03   BNP (last 3 results) No results  for input(s): PROBNP in the last 8760 hours. HbA1C: No results for input(s): HGBA1C in the last 72 hours. CBG:  Recent Labs Lab 08/27/17 0346 08/27/17 0838 08/27/17 1158 08/27/17 1548 08/27/17 1942  GLUCAP 184* 118* 130* 166* 153*   Lipid Profile: No results for input(s): CHOL, HDL, LDLCALC, TRIG, CHOLHDL, LDLDIRECT in the last 72 hours. Thyroid Function Tests: No results for input(s): TSH, T4TOTAL, FREET4, T3FREE, THYROIDAB in the last 72 hours. Anemia Panel: No results for input(s): VITAMINB12, FOLATE, FERRITIN, TIBC, IRON, RETICCTPCT in the last 72 hours. Urine analysis:    Component Value Date/Time   COLORURINE YELLOW 08/26/2017 [place] 08/26/2017 1610   LABSPEC 1.015 08/26/2017 1610   PHURINE 5.5 08/26/2017 1610   GLUCOSEU NEGATIVE 08/26/2017 1610   HGBUR LARGE (A) 08/26/2017 1610   BILIRUBINUR NEGATIVE 08/26/2017 1610   KETONESUR NEGATIVE 08/26/2017 1610   PROTEINUR NEGATIVE 08/26/2017 1610   UROBILINOGEN 0.2 11/08/2014 1942   NITRITE POSITIVE (A) 08/26/2017 1610   LEUKOCYTESUR NEGATIVE 08/26/2017 1610   Sepsis Labs: @LABRCNTIP (procalcitonin:4,lacticidven:4)  ) Recent Results (from the past 240 hour(s))  Culture, blood (routine x 2)     Status: None (Preliminary result)   Collection Time: 08/25/17 10:56 PM  Result Value Ref Range Status   Specimen Description BLOOD RIGHT ARM  Final   Special Requests   Final    BOTTLES DRAWN AEROBIC AND ANAEROBIC Blood Culture adequate volume   Culture NO GROWTH 1 DAY  Final   Report Status PENDING  Incomplete  Culture, blood (routine x 2)     Status: None (Preliminary result)   Collection Time: 08/26/17 12:04 AM  Result Value Ref Range Status   Specimen Description BLOOD RIGHT THUMB  Final   Special Requests IN PEDIATRIC BOTTLE Blood Culture adequate volume  Final   Culture NO GROWTH 1 DAY  Final   Report Status PENDING  Incomplete  Respiratory Panel by PCR     Status: Abnormal   Collection Time:  08/26/17  4:06 PM  Result Value Ref Range Status   Adenovirus NOT DETECTED NOT DETECTED Final   Coronavirus 229E NOT DETECTED NOT DETECTED Final   Coronavirus HKU1 NOT DETECTED NOT DETECTED Final   Coronavirus NL63 NOT DETECTED NOT DETECTED Final   Coronavirus OC43 NOT DETECTED NOT DETECTED Final   Metapneumovirus NOT DETECTED NOT DETECTED Final   Rhinovirus /  Enterovirus DETECTED (A) NOT DETECTED Final   Influenza A NOT DETECTED NOT DETECTED Final   Influenza A H1 NOT DETECTED NOT DETECTED Final   Influenza A H1 2009 NOT DETECTED NOT DETECTED Final   Influenza A H3 NOT DETECTED NOT DETECTED Final   Influenza B NOT DETECTED NOT DETECTED Final   Parainfluenza Virus 1 NOT DETECTED NOT DETECTED Final   Parainfluenza Virus 2 NOT DETECTED NOT DETECTED Final   Parainfluenza Virus 3 NOT DETECTED NOT DETECTED Final   Parainfluenza Virus 4 NOT DETECTED NOT DETECTED Final   Respiratory Syncytial Virus NOT DETECTED NOT DETECTED Final   Bordetella pertussis NOT DETECTED NOT DETECTED Final   Chlamydophila pneumoniae NOT DETECTED NOT DETECTED Final   Mycoplasma pneumoniae NOT DETECTED NOT DETECTED Final  MRSA PCR Screening     Status: Abnormal   Collection Time: 08/26/17  5:14 PM  Result Value Ref Range Status   MRSA by PCR POSITIVE (A) NEGATIVE Final    Comment:        The GeneXpert MRSA Assay (FDA approved for NASAL specimens only), is one component of a comprehensive MRSA colonization surveillance program. It is not intended to diagnose MRSA infection nor to guide or monitor treatment for MRSA infections. RESULT CALLED TO, READ BACK BY AND VERIFIED WITH: Isaac Laud AT 2008 08/26/17 BY L BENFIELD          Radiology Studies: Dg Chest Port 1 View  Result Date: 08/27/2017 CLINICAL DATA:  61 year old female with acute respiratory failure. Admitted yesterday with shortness of Breath, on home oxygen EXAM: PORTABLE CHEST 1 VIEW COMPARISON:  08/25/2017 and earlier. FINDINGS: Portable AP  upright view at 0602 hours. Mildly improved lung volumes and bilateral ventilation. Decreased patchy perihilar and bilateral pulmonary interstitial opacity. No pneumothorax. Trace fluid or thickening along the right minor fissure. No consolidation. Stable cardiac size and mediastinal contours. IMPRESSION: 1. Improved lung volumes and bilateral ventilation since yesterday. Regressed bilateral perihilar and interstitial opacity which could reflect improving edema or infection. 2. No new cardiopulmonary abnormality. Electronically Signed   By: Genevie Ann M.D.   On: 08/27/2017 07:33        Scheduled Meds: . buprenorphine-naloxone  1 tablet Sublingual BID  . Chlorhexidine Gluconate Cloth  6 each Topical Q0600  . diltiazem  240 mg Oral Daily  . enoxaparin (LOVENOX) injection  40 mg Subcutaneous Daily  . famotidine  20 mg Oral BID  . gabapentin  100 mg Oral QHS  . insulin aspart  0-15 Units Subcutaneous Q4H  . ipratropium-albuterol  3 mL Nebulization TID  . ketotifen  1 drop Both Eyes BID  . levothyroxine  75 mcg Oral QAC breakfast  . loratadine  10 mg Oral Daily  . metoprolol tartrate  50 mg Oral BID  . mupirocin ointment  1 application Nasal BID  . pantoprazole (PROTONIX) IV  40 mg Intravenous Daily  . sertraline  100 mg Oral Daily  . sodium chloride flush  3 mL Intravenous Q12H   Continuous Infusions: . sodium chloride Stopped (08/27/17 1604)  .  ceFAZolin (ANCEF) IV Stopped (08/28/17 0656)     LOS: 2 days    Time spent: 40 minutes    WOODS, Geraldo Docker, MD [facility] Pager [phone]   If 7PM-7AM, please contact night-coverage [email or web address] Password [facility] 08/28/2017, 8:52 AM

## 2017-08-28 NOTE — Care Management Note (Addendum)
Case Management Note Marvetta Gibbons RN, BSN Unit 4E-Case Manager [phone]  Patient Details  Name: [name] MRN: [id number] Date of Birth: 1956-07-21  Subjective/Objective:  Pt admitted with Respiratory failure with hypercapnia and hypoxia 2/2 systolic and diastolic CHF exacerbation                  Action/Plan: PTA pt lived at home, has home 02 4L baseline, PT eval pending-  CM to follow for d/c needs  Expected Discharge Date:                  Expected Discharge Plan:  [place]  In-House Referral:     Discharge planning Services  CM Consult  Post Acute Care Choice:    Choice offered to:     DME Arranged:    DME Agency:     HH Arranged:    HH Agency:     Status of Service:  In process, will continue to follow  If discussed at Long Length of Stay Meetings, dates discussed:    Discharge Disposition:   Additional Comments:  Dawayne Patricia, RN 08/28/2017, 9:42 AM

## 2017-08-28 NOTE — Progress Notes (Signed)
Patient requested to have CPAP removed as she felt like she could not breathe and could not tolerate wearing it any longer. Removed CPAP per patient request and placed patient on 4L O2 by [place].

## 2017-08-28 NOTE — Progress Notes (Signed)
Admission/Transfer Note: Pt transferred from 4N. Pt given CHG bath. Pt alert and oriented x4. Pt has glasses, cell phone, phone charger, and 2 bags of belongings at bedside. Pt has a stage 2 pressure ulcer on left buttocks and some discoloration/cracking on bilateral inner thighs and bilateral feet. Pt oriented to unit, bed in lowest position, call bell within reach. Will continue to monitor.

## 2017-08-28 NOTE — Progress Notes (Signed)
CBG of 167 prior to lunch verified on glucometer. Will administer 3 units of Novolog per MAR. See MAR.  Fritz Pickerel, RN

## 2017-08-28 NOTE — Plan of Care (Signed)
Problem: Fluid Volume: Goal: Ability to maintain a balanced intake and output will improve Outcome: Progressing Pt has had good urine output during this shift, >1000cc.

## 2017-08-28 NOTE — Consult Note (Signed)
Ref: Dixie Dials, MD   Subjective:  Congested. Moderate respiratory distress. T max 99 degree F.   Objective:  Vital Signs in the last 24 hours: Temp:  [97.9 F (36.6 C)-99 F (37.2 C)] 97.9 F (36.6 C) (09/18 0817) Pulse Rate:  [59-118] 98 (09/18 0817) Cardiac Rhythm: Atrial fibrillation (09/18 0824) Resp:  [11-24] 18 (09/18 0817) BP: (106-138)/(63-99) 106/63 (09/18 0817) SpO2:  [90 %-99 %] 96 % (09/18 0817) Weight:  [138.4 kg (305 lb 1.6 oz)] 138.4 kg (305 lb 1.6 oz) (09/17 2117)  Physical Exam: BP Readings from Last 1 Encounters:  08/28/17 106/63    Wt Readings from Last 1 Encounters:  08/27/17 (!) 138.4 kg (305 lb 1.6 oz)    Weight change: -7.008 kg (-15 lb 7.2 oz) Body mass index is 50.77 kg/m. HEENT: [place]/AT, Eyes-Blue, PERL, EOMI, Conjunctiva-Pink, Sclera-Non-icteric Neck: + JVD, No bruit, Trachea midline. Lungs:  Wheezing, Bilateral. Cardiac:  Regular rhythm, normal S1 and S2, no S3. II/VI systolic murmur. Abdomen:  Soft, non-tender. BS present. Extremities:  1 + edema present. No cyanosis. No clubbing. CNS: AxOx3, Cranial nerves grossly intact, moves all 4 extremities.  Skin: Warm and dry.   Intake/Output from previous day: 09/17 0701 - 09/18 0700 In: 300 [I.V.:90; IV Piggyback:210] Out: 5750 [Urine:5750]    Lab Results: BMET    Component Value Date/Time   NA 139 08/28/2017 0301   NA 139 08/27/2017 0144   NA 137 08/26/2017 0233   K 3.3 (L) 08/28/2017 0301   K 4.3 08/27/2017 0144   K 4.8 08/26/2017 0233   CL 85 (L) 08/28/2017 0301   CL 90 (L) 08/27/2017 0144   CL 94 (L) 08/26/2017 0233   CO2 44 (H) 08/28/2017 0301   CO2 42 (H) 08/27/2017 0144   CO2 32 08/26/2017 0233   GLUCOSE 145 (H) 08/28/2017 0301   GLUCOSE 129 (H) 08/27/2017 0144   GLUCOSE 172 (H) 08/26/2017 0233   BUN 23 (H) 08/28/2017 0301   BUN 18 08/27/2017 0144   BUN 13 08/26/2017 0233   CREATININE 1.05 (H) 08/28/2017 0301   CREATININE 1.08 (H) 08/27/2017 0144   CREATININE 0.99  08/26/2017 0233   CALCIUM 8.3 (L) 08/28/2017 0301   CALCIUM 8.5 (L) 08/27/2017 0144   CALCIUM 8.5 (L) 08/26/2017 0233   GFRNONAA 55 (L) 08/28/2017 0301   GFRNONAA 53 (L) 08/27/2017 0144   GFRNONAA 59 (L) 08/26/2017 0233   GFRAA >60 08/28/2017 0301   GFRAA >60 08/27/2017 0144   GFRAA >60 08/26/2017 0233   CBC    Component Value Date/Time   WBC 10.0 08/27/2017 0144   RBC 4.05 08/27/2017 0144   HGB 8.9 (L) 08/27/2017 0144   HCT 33.1 (L) 08/27/2017 0144   PLT 282 08/27/2017 0144   MCV 81.7 08/27/2017 0144   MCH 22.0 (L) 08/27/2017 0144   MCHC 26.9 (L) 08/27/2017 0144   RDW 18.1 (H) 08/27/2017 0144   LYMPHSABS 0.6 (L) 08/25/2017 2240   MONOABS 0.2 08/25/2017 2240   EOSABS 0.0 08/25/2017 2240   BASOSABS 0.0 08/25/2017 2240   HEPATIC Function Panel  Recent Labs  07/05/17 0637 08/26/17 0233  PROT 7.2 8.2*   HEMOGLOBIN A1C No components found for: HGA1C,  MPG CARDIAC ENZYMES Lab Results  Component Value Date   CKTOTAL 96 11/12/2007   CKMB 1.8 11/12/2007   TROPONINI <0.03 08/26/2017   TROPONINI <0.03 08/26/2017   TROPONINI <0.03 08/26/2017   BNP No results for input(s): PROBNP in the last 8760 hours. TSH  Recent Labs  07/02/17 1743  TSH 3.802   CHOLESTEROL No results for input(s): CHOL in the last 8760 hours.  Scheduled Meds: . buprenorphine-naloxone  1 tablet Sublingual BID  . Chlorhexidine Gluconate Cloth  6 each Topical Q0600  . diltiazem  240 mg Oral Daily  . enoxaparin (LOVENOX) injection  40 mg Subcutaneous Daily  . famotidine  20 mg Oral BID  . gabapentin  100 mg Oral QHS  . guaiFENesin  600 mg Oral BID  . insulin aspart  0-15 Units Subcutaneous Q4H  . ipratropium-albuterol  3 mL Nebulization TID  . ketotifen  1 drop Both Eyes BID  . levothyroxine  75 mcg Oral QAC breakfast  . loratadine  10 mg Oral Daily  . metoprolol tartrate  50 mg Oral BID  . mupirocin ointment  1 application Nasal BID  . pantoprazole (PROTONIX) IV  40 mg Intravenous Daily   . [START ON 08/29/2017] potassium chloride  10 mEq Oral BID  . sertraline  100 mg Oral Daily  . sodium chloride flush  3 mL Intravenous Q12H   Continuous Infusions: . sodium chloride Stopped (08/27/17 1604)  .  ceFAZolin (ANCEF) IV Stopped (08/28/17 0656)   PRN Meds:.sodium chloride, acetaminophen, guaiFENesin-dextromethorphan, ipratropium-albuterol, prochlorperazine, sodium chloride flush  Assessment/Plan: Acute on chronic hypercarbic respiratory failure Obstructive sleep apnea with CPAP use Acute on chronic preserved LV systolic function heart failure CAD Hypertension Type 2 diabetes mellitus Chronic atrial fibrillation Morbid obesity GERD History of endometrial carcinoma Cellulitis of both lower legs Left buttocks healing pressure ulcer  Continue medical treatment with potassium supplementation.   LOS: 2 days    Dixie Dials  MD  08/28/2017, 9:49 AM

## 2017-08-28 NOTE — Progress Notes (Signed)
Pt was given Living Well with Heart Failure booklet. Writer went over information inside and educated pt on weighing herself daily, taking daily medication especially her lasix, and eating low sodium foods. Pt stated she hasn't been taking her lasix at home because she has to urinate frequently and cannot get to the bathroom quickly. Writer reminded pt that lasix is to help pull the extra fluid off of her to prevent her from getting fluid overload and thus needing hospital admission. Writer discussed possibly using depends and for pt to talk with her doctor about possibly trying a different medication or other potential solutions.

## 2017-08-29 LAB — BASIC METABOLIC PANEL
Anion gap: 13 (ref 5–15)
BUN: 22 mg/dL — AB (ref 6–20)
CHLORIDE: 84 mmol/L — AB (ref 101–111)
CO2: 41 mmol/L — AB (ref 22–32)
CREATININE: 0.95 mg/dL (ref 0.44–1.00)
Calcium: 8.5 mg/dL — ABNORMAL LOW (ref 8.9–10.3)
GFR calc non Af Amer: >60 mL/min (ref >60)
Glucose, Bld: 193 mg/dL — ABNORMAL HIGH (ref 65–99)
POTASSIUM: 5 mmol/L (ref 3.5–5.1)
Sodium: 138 mmol/L (ref 135–145)

## 2017-08-29 LAB — GLUCOSE, CAPILLARY
GLUCOSE-CAPILLARY: 166 mg/dL — AB (ref 65–99)
Glucose-Capillary: 153 mg/dL — ABNORMAL HIGH (ref 65–99)

## 2017-08-29 MED ORDER — PREDNISONE 20 MG PO TABS
40.0000 mg | ORAL_TABLET | Freq: Every day | ORAL | Status: DC
  Administered 2017-08-30: 40 mg via ORAL
  Filled 2017-08-29: qty 2

## 2017-08-29 MED ORDER — LEVALBUTEROL HCL 1.25 MG/0.5ML IN NEBU
1.2500 mg | INHALATION_SOLUTION | Freq: Three times a day (TID) | RESPIRATORY_TRACT | Status: DC
  Administered 2017-08-29 – 2017-08-30 (×4): 1.25 mg via RESPIRATORY_TRACT
  Filled 2017-08-29 (×5): qty 0.5

## 2017-08-29 MED ORDER — PANTOPRAZOLE SODIUM 40 MG PO TBEC
40.0000 mg | DELAYED_RELEASE_TABLET | Freq: Every day | ORAL | Status: DC
  Administered 2017-08-30: 40 mg via ORAL
  Filled 2017-08-29: qty 1

## 2017-08-29 NOTE — Progress Notes (Signed)
Patient did not tolerate BIPAP well overnight. Complained of feeling claustrophobic and had problems with fit of mask. I explained to patient reason for BiPAP due to concerns over increasing CO2 levels shown by ABG. Explained to patient how respiratory support was provided by BiPAP machine while she was sleeping. Patient continued to have issues but did tolerate wearing it until 06:00 this morning. Spent time again this morning explaining importance of wearing BiPAP per MD orders in order to avoid further respiratory complications. Patient returned to O2 on [place] at 4 L after BiPAP was removed. O2 sat 95% on 4L.

## 2017-08-29 NOTE — Progress Notes (Signed)
[place] TEAM 1 - Stepdown/ICU TEAM  [name]  SWF:[id number] DOB: [birth year] DOA: 08/25/2017 PCP: Dixie Dials, MD    Brief Narrative:  61yoF Hx COPD, Chronic Respiratory Failure with Hypercapnia on 4 L O2 at home, OSA on CPAP, Chronic Systolic and Diastolic CHF, CAD, DM2, Hypothyroidism, Chronic Pain syndrome, and morbid obesity who presented w/ SOB. The patient had not been feeling well for a week reporting complaints of cough with yellow sputum production and progressively worsening shortness of breath.  Subjective: The patient is resting comfortably in bed.  She tells me she feels much better today.  She denies current shortness of breath chest pain nausea vomiting or abdominal pain.  Assessment & Plan:  Acute on Chronic Respiratory Failure with Hypoxia and Hypercapnia  Multifactorial:  chronic narcotic use, MORBID OBESITY, suspected OSA/OHS, COPD, A. fib with RVR, CHF - ABG on 7/16 pH 7.32, PCO2 81.1, PO2 54.4 - this admit 7.35/93/69 on O2 - appears to be stabilizing nicely at this time   Rhinovirus URI stopped abx - cont supportive care   COPD Exacerbation Xopenex TID in setting of afib w/ RVR - appears to be stabilizing   Acute encephalopathy Likely secondary to hypercapnia - resolved at this time - avoid sedating meds as much as possible   Chronic pain syndrome Denies uncontrolled pain at this time  Reported hx of Chronic systolic and diastolic CHF TTE this admit notes EF 55-60% w/ no WMA and no report of DD - trace edema bilateral lower extremities at present  [facility] Weights   08/27/17 2117 08/29/17 0400 08/29/17 0429  Weight: (!) 138.4 kg (305 lb 1.6 oz) (!) 136.3 kg (300 lb 8 oz) (!) 136.3 kg (300 lb 8 oz)    Chronic Atrial fibrillation Chadsvasc is 4 - anticoagulation discontinued secondary to bleeding during hospitalization 06/2017 - rate controlled at this time  Essential HTN  Blood pressure well controlled today  CAD Asymptomatic at present -  cardiology following  DM2 with peripheral neuropathy 7/23 A1c 6.7 - does not take any medication for diabetes - CBG reasonably controlled at this time   Hypochromic Anemia:Chronic baseline Hgb 9-10  Hypothyroidism Continue Synthroid   Morbid obesity BMI 49.92  GERD Protonix  Hypokalemia Corrected   Hypomagnesmia Recheck in AM   MRSA screen +  DVT prophylaxis: lovenox  Code Status: FULL CODE Family Communication: no family present at time of exam  Disposition Plan: appears pt will benefit from rehab stay at a SNF   Consultants:  PCCM Cardiology  Procedures: 9/17 TTE - EF55-60% w/ no WMA  Antimicrobials:  Cefazolin 9/16 >  Objective: Blood pressure 120/66, pulse 80, temperature 98.6 F (37 C), temperature source Oral, resp. rate 17, height 5\' 5"  (1.651 m), weight (!) 136.3 kg (300 lb 8 oz), SpO2 92 %.  Intake/Output Summary (Last 24 hours) at 08/29/17 1632 Last data filed at 08/29/17 1100  Gross per 24 hour  Intake              240 ml  Output             4850 ml  Net            -4610 ml   Filed Weights   08/27/17 2117 08/29/17 0400 08/29/17 0429  Weight: (!) 138.4 kg (305 lb 1.6 oz) (!) 136.3 kg (300 lb 8 oz) (!) 136.3 kg (300 lb 8 oz)    Examination: General: No acute respiratory distress - alert and conversant  Lungs:  Clear to auscultation bilaterally without wheezes or crackles - distant BS th/o  Cardiovascular: Regular rate without murmur gallop or rub normal S1 and S2 Abdomen: Nontender, obese, soft, bowel sounds positive, no rebound, no ascites, no appreciable mass Extremities: trace edema B LE   CBC:  Recent Labs Lab 08/25/17 2240 08/26/17 0231 08/27/17 0144  WBC 9.4  --  10.0  NEUTROABS 8.6*  --   --   HGB 10.0* 12.9 8.9*  HCT 37.1 38.0 33.1*  MCV 83.6  --  81.7  PLT 237  --  557   Basic Metabolic Panel:  Recent Labs Lab 08/26/17 0231 08/26/17 0233 08/27/17 0144 08/28/17 0301 08/28/17 1354 08/29/17 0232  NA 139 137 139  139  --  138  K 4.8 4.8 4.3 3.3* 3.8 5.0  CL 92* 94* 90* 85*  --  84*  CO2  --  32 42* 44*  --  41*  GLUCOSE 186* 172* 129* 145*  --  193*  BUN 18 13 18  23*  --  22*  CREATININE 1.00 0.99 1.08* 1.05*  --  0.95  CALCIUM  --  8.5* 8.5* 8.3*  --  8.5*  MG  --   --   --   --  1.6*  --    GFR: Estimated Creatinine Clearance: 84.9 mL/min (by C-G formula based on SCr of 0.95 mg/dL).  Liver Function Tests:  Recent Labs Lab 08/26/17 0233  AST 17  ALT 10*  ALKPHOS 96  BILITOT 0.5  PROT 8.2*  ALBUMIN 3.3*    Cardiac Enzymes:  Recent Labs Lab 08/26/17 0041 08/26/17 0233 08/26/17 1339 08/26/17 1540  TROPONINI <0.03 <0.03 <0.03 <0.03    HbA1C: Hgb A1c MFr Bld  Date/Time Value Ref Range Status  07/02/2017 05:32 PM 6.7 (H) 4.8 - 5.6 % Final    Comment:    (NOTE)         Pre-diabetes: 5.7 - 6.4         Diabetes: >6.4         Glycemic control for adults with diabetes: <7.0   11/09/2014 03:03 AM 6.5 (H) <5.7 % Final    Comment:    (NOTE)                                                                       According to the ADA Clinical Practice Recommendations for 2011, when HbA1c is used as a screening test:  >=6.5%   Diagnostic of Diabetes Mellitus           (if abnormal result is confirmed) 5.7-6.4%   Increased risk of developing Diabetes Mellitus References:Diagnosis and Classification of Diabetes Mellitus,Diabetes DUKG,2542,70(WCBJS 1):S62-S69 and Standards of Medical Care in         Diabetes - 2011,Diabetes Care,2011,34 (Suppl 1):S11-S61.     CBG:  Recent Labs Lab 08/27/17 0346 08/27/17 0838 08/27/17 1158 08/27/17 1548 08/27/17 1942  GLUCAP 184* 118* 130* 166* 153*    Recent Results (from the past 240 hour(s))  Culture, blood (routine x 2)     Status: None (Preliminary result)   Collection Time: 08/25/17 10:56 PM  Result Value Ref Range Status   Specimen Description BLOOD RIGHT ARM  Final   Special Requests  Final    BOTTLES DRAWN AEROBIC AND  ANAEROBIC Blood Culture adequate volume   Culture NO GROWTH 3 DAYS  Final   Report Status PENDING  Incomplete  Culture, blood (routine x 2)     Status: None (Preliminary result)   Collection Time: 08/26/17 12:04 AM  Result Value Ref Range Status   Specimen Description BLOOD RIGHT THUMB  Final   Special Requests IN PEDIATRIC BOTTLE Blood Culture adequate volume  Final   Culture NO GROWTH 3 DAYS  Final   Report Status PENDING  Incomplete  Respiratory Panel by PCR     Status: Abnormal   Collection Time: 08/26/17  4:06 PM  Result Value Ref Range Status   Adenovirus NOT DETECTED NOT DETECTED Final   Coronavirus 229E NOT DETECTED NOT DETECTED Final   Coronavirus HKU1 NOT DETECTED NOT DETECTED Final   Coronavirus NL63 NOT DETECTED NOT DETECTED Final   Coronavirus OC43 NOT DETECTED NOT DETECTED Final   Metapneumovirus NOT DETECTED NOT DETECTED Final   Rhinovirus / Enterovirus DETECTED (A) NOT DETECTED Final   Influenza A NOT DETECTED NOT DETECTED Final   Influenza A H1 NOT DETECTED NOT DETECTED Final   Influenza A H1 2009 NOT DETECTED NOT DETECTED Final   Influenza A H3 NOT DETECTED NOT DETECTED Final   Influenza B NOT DETECTED NOT DETECTED Final   Parainfluenza Virus 1 NOT DETECTED NOT DETECTED Final   Parainfluenza Virus 2 NOT DETECTED NOT DETECTED Final   Parainfluenza Virus 3 NOT DETECTED NOT DETECTED Final   Parainfluenza Virus 4 NOT DETECTED NOT DETECTED Final   Respiratory Syncytial Virus NOT DETECTED NOT DETECTED Final   Bordetella pertussis NOT DETECTED NOT DETECTED Final   Chlamydophila pneumoniae NOT DETECTED NOT DETECTED Final   Mycoplasma pneumoniae NOT DETECTED NOT DETECTED Final  MRSA PCR Screening     Status: Abnormal   Collection Time: 08/26/17  5:14 PM  Result Value Ref Range Status   MRSA by PCR POSITIVE (A) NEGATIVE Final    Comment:        The GeneXpert MRSA Assay (FDA approved for NASAL specimens only), is one component of a comprehensive MRSA  colonization surveillance program. It is not intended to diagnose MRSA infection nor to guide or monitor treatment for MRSA infections. RESULT CALLED TO, READ BACK BY AND VERIFIED WITH: G DICKSON,RN AT 2008 08/26/17 BY L BENFIELD      Scheduled Meds: . buprenorphine-naloxone  1 tablet Sublingual BID  . Chlorhexidine Gluconate Cloth  6 each Topical Q0600  . diltiazem  240 mg Oral Daily  . enoxaparin (LOVENOX) injection  40 mg Subcutaneous Daily  . famotidine  20 mg Oral BID  . furosemide  60 mg Intravenous TID  . gabapentin  100 mg Oral QHS  . guaiFENesin  600 mg Oral BID  . insulin aspart  0-15 Units Subcutaneous TID WC  . ketotifen  1 drop Both Eyes BID  . levalbuterol  1.25 mg Nebulization TID  . levothyroxine  75 mcg Oral QAC breakfast  . loratadine  10 mg Oral Daily  . methylPREDNISolone (SOLU-MEDROL) injection  60 mg Intravenous Q24H  . metoprolol tartrate  50 mg Oral BID  . mupirocin ointment  1 application Nasal BID  . [START ON 08/30/2017] pantoprazole  40 mg Oral Daily  . sertraline  100 mg Oral Daily  . sodium chloride flush  3 mL Intravenous Q12H     LOS: 3 days   Cherene Altes, MD [facility] Office  [phone]  Pager - Text Page per Shea Evans as per below:  On-Call/Text Page:      [email or web address]      password [email or web address]  If 7PM-7AM, please contact night-coverage [email or web address] Password [facility] 08/29/2017, 4:32 PM

## 2017-08-29 NOTE — Consult Note (Signed)
Ref: Dixie Dials, MD   Subjective:  Breathing better. Has need for new face mask for CPAP machine.  Objective:  Vital Signs in the last 24 hours: Temp:  [98.2 F (36.8 C)-98.6 F (37 C)] 98.3 F (36.8 C) (09/19 2002) Pulse Rate:  [80-93] 93 (09/19 2002) Cardiac Rhythm: Atrial fibrillation (09/19 1900) Resp:  [11-19] 13 (09/19 2002) BP: (120-147)/(60-93) 134/60 (09/19 2002) SpO2:  [90 %-97 %] 95 % (09/19 2002) FiO2 (%):  [30 %] 30 % (09/18 2300) Weight:  [136.3 kg (300 lb 8 oz)] 136.3 kg (300 lb 8 oz) (09/19 0429)  Physical Exam: BP Readings from Last 1 Encounters:  08/29/17 134/60    Wt Readings from Last 1 Encounters:  08/29/17 (!) 136.3 kg (300 lb 8 oz)    Weight change: -2.087 kg (-4 lb 9.6 oz) Body mass index is 50.01 kg/m. HEENT: [place]/AT, Eyes-Blue, PERL, EOMI, Conjunctiva-Pink, Sclera-Non-icteric Neck: No JVD, No bruit, Trachea midline. Lungs:  Clear, Bilateral. Cardiac:  Regular rhythm, normal S1 and S2, no S3. II/VI systolic murmur. Abdomen:  Soft, non-tender. BS present. Extremities:  1 + edema present. No cyanosis. No clubbing. Gel Dressing over Sacro-coccygeal area. CNS: AxOx3, Cranial nerves grossly intact, moves all 4 extremities.  Skin: Warm and dry.   Intake/Output from previous day: 09/18 0701 - 09/19 0700 In: 963 [P.O.:960; I.V.:3] Out: 3950 [Urine:3950]    Lab Results: BMET    Component Value Date/Time   NA 138 08/29/2017 0232   NA 139 08/28/2017 0301   NA 139 08/27/2017 0144   K 5.0 08/29/2017 0232   K 3.8 08/28/2017 1354   K 3.3 (L) 08/28/2017 0301   CL 84 (L) 08/29/2017 0232   CL 85 (L) 08/28/2017 0301   CL 90 (L) 08/27/2017 0144   CO2 41 (H) 08/29/2017 0232   CO2 44 (H) 08/28/2017 0301   CO2 42 (H) 08/27/2017 0144   GLUCOSE 193 (H) 08/29/2017 0232   GLUCOSE 145 (H) 08/28/2017 0301   GLUCOSE 129 (H) 08/27/2017 0144   BUN 22 (H) 08/29/2017 0232   BUN 23 (H) 08/28/2017 0301   BUN 18 08/27/2017 0144   CREATININE 0.95 08/29/2017 0232    CREATININE 1.05 (H) 08/28/2017 0301   CREATININE 1.08 (H) 08/27/2017 0144   CALCIUM 8.5 (L) 08/29/2017 0232   CALCIUM 8.3 (L) 08/28/2017 0301   CALCIUM 8.5 (L) 08/27/2017 0144   GFRNONAA >60 08/29/2017 0232   GFRNONAA 55 (L) 08/28/2017 0301   GFRNONAA 53 (L) 08/27/2017 0144   GFRAA >60 08/29/2017 0232   GFRAA >60 08/28/2017 0301   GFRAA >60 08/27/2017 0144   CBC    Component Value Date/Time   WBC 10.0 08/27/2017 0144   RBC 4.05 08/27/2017 0144   HGB 8.9 (L) 08/27/2017 0144   HCT 33.1 (L) 08/27/2017 0144   PLT 282 08/27/2017 0144   MCV 81.7 08/27/2017 0144   MCH 22.0 (L) 08/27/2017 0144   MCHC 26.9 (L) 08/27/2017 0144   RDW 18.1 (H) 08/27/2017 0144   LYMPHSABS 0.6 (L) 08/25/2017 2240   MONOABS 0.2 08/25/2017 2240   EOSABS 0.0 08/25/2017 2240   BASOSABS 0.0 08/25/2017 2240   HEPATIC Function Panel  Recent Labs  07/05/17 0637 08/26/17 0233  PROT 7.2 8.2*   HEMOGLOBIN A1C No components found for: HGA1C,  MPG CARDIAC ENZYMES Lab Results  Component Value Date   CKTOTAL 96 11/12/2007   CKMB 1.8 11/12/2007   TROPONINI <0.03 08/26/2017   TROPONINI <0.03 08/26/2017   TROPONINI <0.03 08/26/2017  BNP No results for input(s): PROBNP in the last 8760 hours. TSH  Recent Labs  07/02/17 1743  TSH 3.802   CHOLESTEROL No results for input(s): CHOL in the last 8760 hours.  Scheduled Meds: . buprenorphine-naloxone  1 tablet Sublingual BID  . Chlorhexidine Gluconate Cloth  6 each Topical Q0600  . diltiazem  240 mg Oral Daily  . enoxaparin (LOVENOX) injection  40 mg Subcutaneous Daily  . famotidine  20 mg Oral BID  . furosemide  60 mg Intravenous TID  . gabapentin  100 mg Oral QHS  . guaiFENesin  600 mg Oral BID  . insulin aspart  0-15 Units Subcutaneous TID WC  . ketotifen  1 drop Both Eyes BID  . levalbuterol  1.25 mg Nebulization TID  . levothyroxine  75 mcg Oral QAC breakfast  . loratadine  10 mg Oral Daily  . metoprolol tartrate  50 mg Oral BID  .  mupirocin ointment  1 application Nasal BID  . [START ON 08/30/2017] pantoprazole  40 mg Oral Daily  . [START ON 08/30/2017] predniSONE  40 mg Oral Q breakfast  . sertraline  100 mg Oral Daily   Continuous Infusions: PRN Meds:.acetaminophen, guaiFENesin-dextromethorphan, prochlorperazine  Assessment/Plan: Acute on chronic hypercarbic respiratory failure OSA with CPAP use Acute on chronic preserved LV systolic function failure CAD Hypertension Morbid obesity DM, II Chronic atrial fibrillation GERD H/O endometrial carcinoma Cellulitis of both lower legs Left buttocks healing pressure ulcer  Continue medical treatment. Decrease potassium supplement for upper limit of normal potassium level. Patient or family to check with Dr. Marc Morgans office for CPAP adjustment.   LOS: 3 days    Dixie Dials  MD  08/29/2017, 8:31 PM

## 2017-08-29 NOTE — Progress Notes (Signed)
Nutrition Consult  Received MD consult for patient to receive a 2000 kcal/day diet. Currently on a heart healthy CHO modified diet which is providing ~1500-1700 kcals per day. This is an appropriate calorie level for this patient to promote weight loss. No further nutrition intervention indicated at this time. Please re-consult if nutrition concerns arise.  Molli Barrows, RD, LDN, [place] Pager [phone] After Hours Pager [phone]

## 2017-08-30 DIAGNOSIS — J9621 Acute and chronic respiratory failure with hypoxia: Secondary | ICD-10-CM | POA: Diagnosis not present

## 2017-08-30 DIAGNOSIS — Z9119 Patient's noncompliance with other medical treatment and regimen: Secondary | ICD-10-CM | POA: Diagnosis not present

## 2017-08-30 DIAGNOSIS — Z9981 Dependence on supplemental oxygen: Secondary | ICD-10-CM | POA: Diagnosis not present

## 2017-08-30 DIAGNOSIS — G4733 Obstructive sleep apnea (adult) (pediatric): Secondary | ICD-10-CM

## 2017-08-30 DIAGNOSIS — M6281 Muscle weakness (generalized): Secondary | ICD-10-CM | POA: Diagnosis not present

## 2017-08-30 DIAGNOSIS — Z91199 Patient's noncompliance with other medical treatment and regimen due to unspecified reason: Secondary | ICD-10-CM

## 2017-08-30 DIAGNOSIS — I5042 Chronic combined systolic (congestive) and diastolic (congestive) heart failure: Secondary | ICD-10-CM

## 2017-08-30 DIAGNOSIS — I482 Chronic atrial fibrillation: Secondary | ICD-10-CM | POA: Diagnosis not present

## 2017-08-30 DIAGNOSIS — E119 Type 2 diabetes mellitus without complications: Secondary | ICD-10-CM | POA: Diagnosis not present

## 2017-08-30 DIAGNOSIS — F329 Major depressive disorder, single episode, unspecified: Secondary | ICD-10-CM | POA: Diagnosis not present

## 2017-08-30 DIAGNOSIS — B348 Other viral infections of unspecified site: Secondary | ICD-10-CM

## 2017-08-30 DIAGNOSIS — K219 Gastro-esophageal reflux disease without esophagitis: Secondary | ICD-10-CM | POA: Diagnosis not present

## 2017-08-30 DIAGNOSIS — J441 Chronic obstructive pulmonary disease with (acute) exacerbation: Secondary | ICD-10-CM | POA: Diagnosis not present

## 2017-08-30 DIAGNOSIS — L89322 Pressure ulcer of left buttock, stage 2: Secondary | ICD-10-CM | POA: Diagnosis not present

## 2017-08-30 DIAGNOSIS — E1140 Type 2 diabetes mellitus with diabetic neuropathy, unspecified: Secondary | ICD-10-CM | POA: Diagnosis not present

## 2017-08-30 DIAGNOSIS — G629 Polyneuropathy, unspecified: Secondary | ICD-10-CM | POA: Diagnosis not present

## 2017-08-30 DIAGNOSIS — I2510 Atherosclerotic heart disease of native coronary artery without angina pectoris: Secondary | ICD-10-CM | POA: Diagnosis not present

## 2017-08-30 DIAGNOSIS — R1311 Dysphagia, oral phase: Secondary | ICD-10-CM | POA: Diagnosis not present

## 2017-08-30 DIAGNOSIS — E662 Morbid (severe) obesity with alveolar hypoventilation: Secondary | ICD-10-CM | POA: Diagnosis not present

## 2017-08-30 DIAGNOSIS — I10 Essential (primary) hypertension: Secondary | ICD-10-CM | POA: Diagnosis not present

## 2017-08-30 DIAGNOSIS — S31000A Unspecified open wound of lower back and pelvis without penetration into retroperitoneum, initial encounter: Secondary | ICD-10-CM | POA: Diagnosis not present

## 2017-08-30 DIAGNOSIS — J9622 Acute and chronic respiratory failure with hypercapnia: Secondary | ICD-10-CM | POA: Diagnosis not present

## 2017-08-30 DIAGNOSIS — J9611 Chronic respiratory failure with hypoxia: Secondary | ICD-10-CM | POA: Diagnosis not present

## 2017-08-30 DIAGNOSIS — R262 Difficulty in walking, not elsewhere classified: Secondary | ICD-10-CM | POA: Diagnosis not present

## 2017-08-30 DIAGNOSIS — G894 Chronic pain syndrome: Secondary | ICD-10-CM | POA: Diagnosis not present

## 2017-08-30 DIAGNOSIS — E876 Hypokalemia: Secondary | ICD-10-CM | POA: Diagnosis not present

## 2017-08-30 DIAGNOSIS — R5381 Other malaise: Secondary | ICD-10-CM | POA: Diagnosis not present

## 2017-08-30 DIAGNOSIS — F419 Anxiety disorder, unspecified: Secondary | ICD-10-CM | POA: Diagnosis not present

## 2017-08-30 DIAGNOSIS — I5033 Acute on chronic diastolic (congestive) heart failure: Secondary | ICD-10-CM | POA: Diagnosis not present

## 2017-08-30 DIAGNOSIS — J449 Chronic obstructive pulmonary disease, unspecified: Secondary | ICD-10-CM | POA: Diagnosis not present

## 2017-08-30 DIAGNOSIS — J80 Acute respiratory distress syndrome: Secondary | ICD-10-CM | POA: Diagnosis not present

## 2017-08-30 DIAGNOSIS — R488 Other symbolic dysfunctions: Secondary | ICD-10-CM | POA: Diagnosis not present

## 2017-08-30 DIAGNOSIS — Z9189 Other specified personal risk factors, not elsewhere classified: Secondary | ICD-10-CM | POA: Diagnosis not present

## 2017-08-30 DIAGNOSIS — I110 Hypertensive heart disease with heart failure: Secondary | ICD-10-CM | POA: Diagnosis not present

## 2017-08-30 LAB — CBC
HCT: 42 % (ref 36.0–46.0)
Hemoglobin: 11.7 g/dL — ABNORMAL LOW (ref 12.0–15.0)
MCH: 22.4 pg — AB (ref 26.0–34.0)
MCHC: 27.9 g/dL — AB (ref 30.0–36.0)
MCV: 80.3 fL (ref 78.0–100.0)
Platelets: 272 10*3/uL (ref 150–400)
RBC: 5.23 MIL/uL — AB (ref 3.87–5.11)
RDW: 17.7 % — ABNORMAL HIGH (ref 11.5–15.5)
WBC: 9.9 10*3/uL (ref 4.0–10.5)

## 2017-08-30 LAB — BASIC METABOLIC PANEL
ANION GAP: 13 (ref 5–15)
BUN: 28 mg/dL — ABNORMAL HIGH (ref 6–20)
CALCIUM: 8.4 mg/dL — AB (ref 8.9–10.3)
CO2: 44 mmol/L — AB (ref 22–32)
Chloride: 79 mmol/L — ABNORMAL LOW (ref 101–111)
Creatinine, Ser: 1.05 mg/dL — ABNORMAL HIGH (ref 0.44–1.00)
GFR calc non Af Amer: 55 mL/min — ABNORMAL LOW (ref >60)
Glucose, Bld: 169 mg/dL — ABNORMAL HIGH (ref 65–99)
Potassium: 4.2 mmol/L (ref 3.5–5.1)
Sodium: 136 mmol/L (ref 135–145)

## 2017-08-30 LAB — MAGNESIUM: MAGNESIUM: 2 mg/dL (ref 1.7–2.4)

## 2017-08-30 MED ORDER — LORATADINE 10 MG PO TABS: 10.0000 mg | ORAL_TABLET | Freq: Every day | ORAL | 0 refills | Status: DC

## 2017-08-30 MED ORDER — PANTOPRAZOLE SODIUM 40 MG PO TBEC: 40.0000 mg | DELAYED_RELEASE_TABLET | Freq: Every day | ORAL | 0 refills | Status: DC

## 2017-08-30 MED ORDER — SERTRALINE HCL 100 MG PO TABS: 100.0000 mg | ORAL_TABLET | Freq: Every day | ORAL | 0 refills | Status: DC

## 2017-08-30 MED ORDER — FUROSEMIDE 40 MG PO TABS
40.0000 mg | ORAL_TABLET | Freq: Two times a day (BID) | ORAL | Status: DC
  Administered 2017-08-30 (×2): 40 mg via ORAL
  Filled 2017-08-30 (×2): qty 1

## 2017-08-30 MED ORDER — LEVALBUTEROL HCL 1.25 MG/0.5ML IN NEBU: 1.2500 mg | INHALATION_SOLUTION | Freq: Three times a day (TID) | RESPIRATORY_TRACT | 0 refills | Status: DC

## 2017-08-30 MED ORDER — POTASSIUM CHLORIDE ER 10 MEQ PO TBCR: 10.0000 meq | EXTENDED_RELEASE_TABLET | Freq: Two times a day (BID) | ORAL | 0 refills | Status: DC

## 2017-08-30 MED ORDER — METOPROLOL TARTRATE 50 MG PO TABS: 50.0000 mg | ORAL_TABLET | Freq: Two times a day (BID) | ORAL | 0 refills | Status: DC

## 2017-08-30 MED ORDER — PREDNISONE 20 MG PO TABS: ORAL_TABLET | ORAL | 0 refills | Status: DC

## 2017-08-30 MED ORDER — POTASSIUM CHLORIDE ER 10 MEQ PO TBCR
10.0000 meq | EXTENDED_RELEASE_TABLET | Freq: Two times a day (BID) | ORAL | Status: DC
  Administered 2017-08-30: 10 meq via ORAL
  Filled 2017-08-30 (×3): qty 1

## 2017-08-30 MED ORDER — FUROSEMIDE 40 MG PO TABS: 40.0000 mg | ORAL_TABLET | Freq: Two times a day (BID) | ORAL | 0 refills | Status: DC

## 2017-08-30 MED ORDER — GUAIFENESIN ER 600 MG PO TB12: 600.0000 mg | ORAL_TABLET | Freq: Two times a day (BID) | ORAL | 0 refills | Status: DC

## 2017-08-30 NOTE — Discharge Summary (Signed)
Physician Discharge Summary  KATJA BLUE UEA:[id number] DOB: 11-11-1956 DOA: 08/25/2017  PCP: Dixie Dials, MD  Admit date: 08/25/2017 Discharge date: 08/30/2017  Time spent: 45 minutes  Recommendations for Outpatient Follow-up:  Acute on Chronic Respiratory Failure with Hypoxia and Hypercapnia/OSA and OHS? -Multifactorial, chronic narcotic use, MORBID OBESITY, suspect OSA/OHS, COPD, A. fib with RVR, CHF. -With QHS BiPAP use/daily O2 use patient's mentation has significantly improved. -Titrate O2 to maintain SPO2 89 - 93% -Prednisone 40 mg daily: Given patient's poor pulmonary function will perform slow titration. -Mucinex  -Schedule follow-up with Dr. Chesley Mires PC CM 2- 3 weeks. OSA/OHS/noncompliance. Patient will require outpatient sleep study   Positive rhinovirus COPD Exacerbation -Xopenex TID -See respiratory failure   Acute encephalopathy:  -Likely secondary to above with hypercapnia. - Resolved. Patient counseled extensively on need to continue BiPAP and O2   Chronic pain syndrome -Home medication list reflects patient on Subutex, however UDS negative for opioid.  -On admission UDS showed that patient had not been on opioids, however patient was started on her reported home dose of Subutex.  -Per patient sees pain clinic in [facility] Dr. Luana Shu Dr. Earnest Rosier. Patient to restart her home medication regimen per their instructions.     Chronic systolic and diastolic CHF -Strict I&O since admission -10.7 L -Daily weight Filed Weights   08/29/17 0400 08/29/17 0429 08/30/17 0500  Weight: (!) 300 lb 8 oz (136.3 kg) (!) 300 lb 8 oz (136.3 kg) 282 lb 4.8 oz (128.1 kg)  -patient's dry weight until otherwise instructed by cardiologist will be 128 kg  -Cardizem 240 mg daily -Lasix 40 mg BID -Metoprolol 50 mg BID  -Scheduled follow-up  1 week  Dr. 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 (baseline  Hg= 9-10)   -Patient presents with a hemoglobin of 10 with low MCH.  -No reported bleeding. - Continue to monitor   Hypothyroidism - Synthroid 75 g daily   Morbid obesity BMI 49.92 -Heart healthy/Carb modified 2000-calorie per day diet   GERD - Protonix 40 g daily    Hypokalemia -Potassium goal> 4 -Potassium 10 mEq BID   Hypomagnesmia -Magnesium goal> 2  Noncompliance -Patient noncompliant with medication and treatments. Patient counseled at length concerning for sequela of continuing to not follow treatment plan to include worsening CHF, DEATH.    Discharge Diagnoses:  Principal Problem:   Acute on chronic respiratory failure with hypoxia and hypercapnia (HCC) Active Problems:   Diabetes mellitus (HCC)   HTN (hypertension)   Atrial fibrillation (HCC)   Acute on chronic combined systolic and diastolic CHF (congestive heart failure) (HCC)   Obesity, Class III, BMI 40-49.9 (morbid obesity) ([place])   Hypothyroidism   Hypochromic anemia   Acute respiratory failure with hypoxia and hypercarbia ([place])   Discharge Condition: Stable  Diet recommendation: Heart healthy/Carb modified 2000-calorie per day diet  Filed Weights   08/29/17 0400 08/29/17 0429 08/30/17 0500  Weight: (!) 300 lb 8 oz (136.3 kg) (!) 300 lb 8 oz (136.3 kg) 282 lb 4.8 oz (128.1 kg)    History of present illness:  61 y.o. WF PMHx COPD, Chronic Respiratory  Failure with Hypercapnia on 4 L O2 at home,  OSA on CPAP, Chronic Systolic and Diastolic CHF,  CAD,DM type II, Hypothyroidism, Chronic Pain syndrome, morbid obesity, noncompliance with medication.   Presents with  complaints of shortness of breath. Patient is normally on 4 L of oxygen 24/7. History is limited due to patient's current mental acuity as she repeatedly falls sleep during evaluation. It appears that the patient had not been feeling well for the last week reporting complaints of cough with yellow sputum production,  and progressively worsening shortness of breath. Patient admits to wheezing intermittently, but does not have home breathing treatments.  During his hospital physician patient was treated for acute on chronic respiratory failure with hypoxia and hypercapnia secondary to untreated OSA/OHS. Exacerbated by COPD exacerbation secondary to rhinovirus. Patient responded well to being placed on appropriate BiPAP, and O2 with her encephalopathy resolving. In addition medication adjusted for chronic systolic and diastolic CHF. Patient diuresed responded well. Stable for discharge    Procedures: 9/17 echocardiogram: LVEF 55-60%, no wall motion abnormality  Consultations: [facility]    Cultures  9/15 blood NGTD   9/16 MRSA by PCR positive 9/16 respiratory virus panel positive rhinovirus   Antibiotics Anti-infectives    Start     Stop   08/26/17 0900  ceFAZolin (ANCEF) 500 mg in dextrose 5 % 100 mL IVPB  Status:  Discontinued     08/28/17 2111       Discharge Exam: Vitals:   08/30/17 0500 08/30/17 0800 08/30/17 1229 08/30/17 1435  BP: 118/78 (!) 99/58 122/71 122/71  Pulse:  99 94 100  Resp: 13 20 18 18   Temp: 98.6 F (37 C) 98.1 F (36.7 C) 97.9 F (36.6 C)   TempSrc: Oral Oral Oral   SpO2:  98% 96% 96%  Weight: 282 lb 4.8 oz (128.1 kg)     Height:        General: A/O 4, positive acute on chronic respiratory distress Neck:  Negative scars, masses, torticollis, lymphadenopathy, JVD Lungs:  clear to auscultation bilateral, mild expiratory wheezing diffusely, negative crackles Cardiovascular: Irregular irregular rhythm and rate, without murmur gallop or rub normal  S1 and S2 Abdomen: MORBIDLY OBESE, negative abdominal pain, nondistended, positive soft, bowel sounds, no rebound, no ascites, no appreciable mass   Discharge Instructions   Allergies as of 08/30/2017      Reactions   Aspirin Other (See Comments)   Chest pain   Eliquis [apixaban]    Excessive bleeding   Nsaids Other (See Comments)   Chest pain   Other Other (See Comments)   "BP med" combined with plavix, caused syncope   Plavix [clopidogrel] Other (See Comments)   syncope   Sudafed [pseudoephedrine] Palpitations   Raised BP   Erythromycin Nausea And Vomiting   Sulfa Antibiotics Other (See Comments)   constipation   Sulfamethoxazole-trimethoprim Diarrhea   Altace [ramipril] Other (See Comments)   Severe fatigue   Zofran [ondansetron Hcl] Other (See Comments)   "I put me into a coma"      Medication List    STOP taking these medications   potassium chloride 20 MEQ packet Commonly known as:  KLOR-CON Replaced by:  potassium chloride 10 MEQ tablet     TAKE these medications   buprenorphine 8 MG Subl SL tablet Commonly known as:  SUBUTEX Place 8 mg under the tongue 2 (two) times daily.   cetirizine 10 MG tablet Commonly known as:  ZYRTEC Take 10 mg by mouth at  bedtime.   clotrimazole-betamethasone cream Commonly known as:  LOTRISONE Apply 1 application topically daily as needed (antifungal).   diltiazem 240 MG 24 hr capsule Commonly known as:  DILACOR XR Take 1 capsule (240 mg total) by mouth daily.   furosemide 40 MG tablet Commonly known as:  LASIX Take 1 tablet (40 mg total) by mouth 2 (two) times daily. What changed:  how much to take  when to take this   gabapentin 100 MG capsule Commonly known as:  NEURONTIN Take 100 mg by mouth at bedtime.   guaiFENesin 600 MG 12 hr tablet Commonly known as:  MUCINEX Take 1 tablet (600 mg total) by mouth 2 (two) times daily.   ketotifen 0.025 % ophthalmic solution Commonly known as:  ZADITOR Place 1 drop  into both eyes daily as needed. Three times a day   levalbuterol 1.25 MG/0.5ML nebulizer solution Commonly known as:  XOPENEX Take 1.25 mg by nebulization 3 (three) times daily.   loratadine 10 MG tablet Commonly known as:  CLARITIN Take 1 tablet (10 mg total) by mouth daily.   metoprolol tartrate 50 MG tablet Commonly known as:  LOPRESSOR Take 1 tablet (50 mg total) by mouth 2 (two) times daily. What changed:  medication strength  how much to take   pantoprazole 40 MG tablet Commonly known as:  PROTONIX Take 1 tablet (40 mg total) by mouth daily.   potassium chloride 10 MEQ tablet Commonly known as:  K-DUR Take 1 tablet (10 mEq total) by mouth 2 (two) times daily. Replaces:  potassium chloride 20 MEQ packet   predniSONE 20 MG tablet Commonly known as:  DELTASONE 1 tab by mouth day 1-day 7 1/2 tab by mouth day 8-day 14   prochlorperazine 10 MG tablet Commonly known as:  COMPAZINE Take 10 mg by mouth every 6 (six) hours as needed for nausea or vomiting.   ranitidine 150 MG tablet Commonly known as:  ZANTAC Take 150 mg by mouth 2 (two) times daily.   sertraline 100 MG tablet Commonly known as:  ZOLOFT Take 1 tablet (100 mg total) by mouth daily. What changed:  how much to take  when to take this   SYNTHROID 75 MCG tablet Generic drug:  levothyroxine Take 75 mcg by mouth daily before breakfast.            Discharge Care Instructions        Start     Ordered   08/31/17 0000  sertraline (ZOLOFT) 100 MG tablet  Daily     08/30/17 1635   08/31/17 0000  loratadine (CLARITIN) 10 MG tablet  Daily     08/30/17 1635   08/31/17 0000  pantoprazole (PROTONIX) 40 MG tablet  Daily     08/30/17 1635   08/30/17 0000  furosemide (LASIX) 40 MG tablet  2 times daily     08/30/17 1635   08/30/17 0000  metoprolol tartrate (LOPRESSOR) 50 MG tablet  2 times daily     08/30/17 1635   08/30/17 0000  potassium chloride (K-DUR) 10 MEQ tablet  2 times daily     08/30/17  1635   08/30/17 0000  guaiFENesin (MUCINEX) 600 MG 12 hr tablet  2 times daily     08/30/17 1635   08/30/17 0000  levalbuterol (XOPENEX) 1.25 MG/0.5ML nebulizer solution  3 times daily     08/30/17 1635   08/30/17 0000  predniSONE (DELTASONE) 20 MG tablet     08/30/17 1635     Allergies  Allergen Reactions  . Aspirin Other (See Comments)    Chest pain  . Eliquis [Apixaban]     Excessive bleeding  . Nsaids Other (See Comments)    Chest pain  . Other Other (See Comments)    "BP med" combined with plavix, caused syncope  . Plavix [Clopidogrel] Other (See Comments)    syncope  . Sudafed [Pseudoephedrine] Palpitations    Raised BP  . Erythromycin Nausea And Vomiting  . Sulfa Antibiotics Other (See Comments)    constipation  . Sulfamethoxazole-Trimethoprim Diarrhea  . Altace [Ramipril] Other (See Comments)    Severe fatigue  . Zofran [Ondansetron Hcl] Other (See Comments)    "I put me into a coma"    Contact information for follow-up providers    Dixie Dials, MD. Schedule an appointment as soon as possible for a visit in 1 week(s).   Specialty:  Cardiology Contact information: [place] [address] [phone]        Chesley Mires, MD. Schedule an appointment as soon as possible for a visit in 3 week(s).   Specialty:  Pulmonary Disease Why:  Schedule follow-up with Dr. Chesley Mires PC CM 2- 3 weeks. OSA/OHS/noncompliance. Patient will require outpatient sleep study Contact information: [address] [address] [phone]            Contact information for after-discharge care    Destination    [facility] Follow up.   Specialty:  [place] information: [address] [place] [phone]                   The results of significant diagnostics from this hospitalization (including imaging, microbiology, ancillary and laboratory) are  listed below for reference.    Significant Diagnostic Studies: Dg Chest Port 1 View  Result Date: 08/27/2017 CLINICAL DATA:  62 year old female with acute respiratory failure. Admitted yesterday with shortness of Breath, on home oxygen EXAM: PORTABLE CHEST 1 VIEW COMPARISON:  08/25/2017 and earlier. FINDINGS: Portable AP upright view at 0602 hours. Mildly improved lung volumes and bilateral ventilation. Decreased patchy perihilar and bilateral pulmonary interstitial opacity. No pneumothorax. Trace fluid or thickening along the right minor fissure. No consolidation. Stable cardiac size and mediastinal contours. IMPRESSION: 1. Improved lung volumes and bilateral ventilation since yesterday. Regressed bilateral perihilar and interstitial opacity which could reflect improving edema or infection. 2. No new cardiopulmonary abnormality. Electronically Signed   By: Genevie Ann M.D.   On: 08/27/2017 07:33   Dg Chest Port 1 View  Result Date: 08/25/2017 CLINICAL DATA:  Shortness of breath tonight. EXAM: PORTABLE CHEST 1 VIEW COMPARISON:  02/10/2016 FINDINGS: Low lung volumes persist. The heart is enlarged. Increased ill-defined peribronchial opacities throughout both lungs suspicious for pulmonary edema. Linear opacity in the right mid lung may be external artifact, atelectasis, or fluid in the fissure. No pneumothorax. Soft tissue attenuation from body habitus limits assessment. IMPRESSION: Low lung volumes cardiomegaly and ill-defined peribronchial opacities suspicious for pulmonary edema. Overall findings suggest CHF. Electronically Signed   By: Jeb Levering M.D.   On: 08/25/2017 23:20    Microbiology: Recent Results (from the past 240 hour(s))  Culture, blood (routine x 2)     Status: None (Preliminary result)   Collection Time: 08/25/17 10:56 PM  Result Value Ref Range Status   Specimen Description BLOOD RIGHT ARM  Final   Special Requests   Final    BOTTLES DRAWN AEROBIC AND  ANAEROBIC Blood Culture  adequate volume   Culture NO GROWTH 4 DAYS  Final   Report Status PENDING  Incomplete  Culture, blood (routine x 2)     Status: None (Preliminary result)   Collection Time: 08/26/17 12:04 AM  Result Value Ref Range Status   Specimen Description BLOOD RIGHT THUMB  Final   Special Requests IN PEDIATRIC BOTTLE Blood Culture adequate volume  Final   Culture NO GROWTH 4 DAYS  Final   Report Status PENDING  Incomplete  Respiratory Panel by PCR     Status: Abnormal   Collection Time: 08/26/17  4:06 PM  Result Value Ref Range Status   Adenovirus NOT DETECTED NOT DETECTED Final   Coronavirus 229E NOT DETECTED NOT DETECTED Final   Coronavirus HKU1 NOT DETECTED NOT DETECTED Final   Coronavirus NL63 NOT DETECTED NOT DETECTED Final   Coronavirus OC43 NOT DETECTED NOT DETECTED Final   Metapneumovirus NOT DETECTED NOT DETECTED Final   Rhinovirus / Enterovirus DETECTED (A) NOT DETECTED Final   Influenza A NOT DETECTED NOT DETECTED Final   Influenza A H1 NOT DETECTED NOT DETECTED Final   Influenza A H1 2009 NOT DETECTED NOT DETECTED Final   Influenza A H3 NOT DETECTED NOT DETECTED Final   Influenza B NOT DETECTED NOT DETECTED Final   Parainfluenza Virus 1 NOT DETECTED NOT DETECTED Final   Parainfluenza Virus 2 NOT DETECTED NOT DETECTED Final   Parainfluenza Virus 3 NOT DETECTED NOT DETECTED Final   Parainfluenza Virus 4 NOT DETECTED NOT DETECTED Final   Respiratory Syncytial Virus NOT DETECTED NOT DETECTED Final   Bordetella pertussis NOT DETECTED NOT DETECTED Final   Chlamydophila pneumoniae NOT DETECTED NOT DETECTED Final   Mycoplasma pneumoniae NOT DETECTED NOT DETECTED Final  MRSA PCR Screening     Status: Abnormal   Collection Time: 08/26/17  5:14 PM  Result Value Ref Range Status   MRSA by PCR POSITIVE (A) NEGATIVE Final    Comment:        The GeneXpert MRSA Assay (FDA approved for NASAL specimens only), is one component of a comprehensive MRSA colonization surveillance program. It is  not intended to diagnose MRSA infection nor to guide or monitor treatment for MRSA infections. RESULT CALLED TO, READ BACK BY AND VERIFIED WITH: G DICKSON,RN AT 2008 08/26/17 BY L BENFIELD      Labs: Basic Metabolic Panel:  Recent Labs Lab 08/26/17 0233 08/27/17 0144 08/28/17 0301 08/28/17 1354 08/29/17 0232 08/30/17 0621  NA 137 139 139  --  138 136  K 4.8 4.3 3.3* 3.8 5.0 4.2  CL 94* 90* 85*  --  84* 79*  CO2 32 42* 44*  --  41* 44*  GLUCOSE 172* 129* 145*  --  193* 169*  BUN 13 18 23*  --  22* 28*  CREATININE 0.99 1.08* 1.05*  --  0.95 1.05*  CALCIUM 8.5* 8.5* 8.3*  --  8.5* 8.4*  MG  --   --   --  1.6*  --  2.0   Liver Function Tests:  Recent Labs Lab 08/26/17 0233  AST 17  ALT 10*  ALKPHOS 96  BILITOT 0.5  PROT 8.2*  ALBUMIN 3.3*   No results for input(s): LIPASE, AMYLASE in the last 168 hours. No results for input(s): AMMONIA in the last 168 hours. CBC:  Recent Labs Lab 08/25/17 2240 08/26/17 0231 08/27/17 0144 08/30/17 0621  WBC 9.4  --  10.0 9.9  NEUTROABS 8.6*  --   --   --  HGB 10.0* 12.9 8.9* 11.7*  HCT 37.1 38.0 33.1* 42.0  MCV 83.6  --  81.7 80.3  PLT 237  --  282 272   Cardiac Enzymes:  Recent Labs Lab 08/26/17 0041 08/26/17 0233 08/26/17 1339 08/26/17 1540  TROPONINI <0.03 <0.03 <0.03 <0.03   BNP: BNP (last 3 results)  Recent Labs  08/26/17 0041  BNP 585.9*    ProBNP (last 3 results) No results for input(s): PROBNP in the last 8760 hours.  CBG:  Recent Labs Lab 08/27/17 1158 08/27/17 1548 08/27/17 1942 08/29/17 1633 08/29/17 2136  GLUCAP 130* 166* 153* 153* 166*       Signed:  Dia Crawford, MD [facility] [phone] pager

## 2017-08-30 NOTE — Progress Notes (Signed)
RT checked with patient about wearing the BIPAP and patient stated she was not ready. This is the second time RT has been to the patient's room regarding the BIPAP. Patient stated she would have RN call RT when she is ready to go to sleep and wear BIPAP. RT will monitor as needed.

## 2017-08-30 NOTE — NC FL2 (Signed)
[place] MEDICAID FL2 LEVEL OF CARE SCREENING TOOL     IDENTIFICATION  Patient Name: [name] Birthdate: 1956/07/20 Sex: female Admission Date (Current Location): 08/25/2017  [facility] and [place] Number:  Herbalist and Address:  [facility], [place] [address], [place], [place] [address]      Provider Number: [id number]  Attending Physician Name and Address:  Allie Bossier, MD  Relative Name and Phone Number:       Current Level of Care: Hospital Recommended Level of Care: [place] Prior Approval Number:    Date Approved/Denied: 08/28/17 PASRR Number: [id number]  Discharge Plan: SNF    Current Diagnoses: Patient Active Problem List   Diagnosis Date Noted  . Acute on chronic respiratory failure with hypoxia and hypercapnia ([place]) 08/26/2017  . Hypothyroidism 08/26/2017  . Hypochromic anemia 08/26/2017  . Acute respiratory failure with hypoxia and hypercarbia ([place]) 08/26/2017  . Pressure injury of skin 07/03/2017  . Cellulitis of leg, left 07/02/2017  . Obesity, Class III, BMI 40-49.9 (morbid obesity) ([place]) 07/02/2017  . Acute on chronic combined systolic and diastolic CHF (congestive heart failure) ([place]) 02/10/2016  . Acute respiratory failure with hypoxia and hypercapnia (HCC)   . Seizures ([place])   . Aspiration pneumonia ([place])   . Sepsis ([place])   . Acute on chronic respiratory failure ([place]) 12/25/2015  . Pneumonia 11/09/2014  . CAP (community acquired pneumonia) 10/08/2014  . Atrial fibrillation ([place]) 04/11/2014  . Narcotic overdose 07/07/2013  . Hypoxia 07/07/2013  . Diabetes mellitus ([place]) 07/07/2013  . HTN (hypertension) 07/07/2013  . Chronic pain 07/07/2013  . Hypercapnic respiratory failure, chronic ([place]) 10/17/2011  . OSA (obstructive sleep apnea) 12/23/2007    Orientation RESPIRATION BLADDER Height & Weight     Self, Time, Situation, Place  Other (Comment) (Bibap setting: 14/5 at 40% , medium  full face mask ) Indwelling catheter Weight: 282 lb 4.8 oz (128.1 kg) Height:  5\' 5"  (165.1 cm)  BEHAVIORAL SYMPTOMS/MOOD NEUROLOGICAL BOWEL NUTRITION STATUS      Continent Diet (See DC Summary)  AMBULATORY STATUS COMMUNICATION OF NEEDS Skin   Limited Assist Verbally Normal                       Personal Care Assistance Level of Assistance              Functional Limitations Info             SPECIAL CARE FACTORS FREQUENCY  PT (By licensed PT)     PT Frequency: 3x week              Contractures      Additional Factors Info  Isolation Precautions, Code Status, Allergies Code Status Info: Full code Allergies Info: ASPIRIN, ELIQUIS APIXABAN, NSAIDS, OTHER, PLAVIX CLOPIDOGREL, SUDAFED PSEUDOEPHEDRINE, ERYTHROMYCIN, SULFA ANTIBIOTICS, SULFAMETHOXAZOLE-TRIMETHOPRIM, ALTACE RAMIPRIL, ZOFRAN ONDANSETRON HCL     Isolation Precautions Info: MRSA     Current Medications (08/30/2017):  This is the current hospital active medication list Current Facility-Administered Medications  Medication Dose Route Frequency Provider Last Rate Last Dose  . acetaminophen (TYLENOL) tablet 650 mg  650 mg Oral Q4H PRN Fuller Plan A, MD   650 mg at 08/30/17 0020  . buprenorphine-naloxone (SUBOXONE) 8-2 mg per SL tablet 1 tablet  1 tablet Sublingual BID Fuller Plan A, MD   1 tablet at 08/30/17 0600  . Chlorhexidine Gluconate Cloth 2 % PADS 6 each  6 each Topical Q0600  Marshell Garfinkel, MD   6 each at 08/30/17 0600  . diltiazem (CARDIZEM CD) 24 hr capsule 240 mg  240 mg Oral Daily Rush Farmer, MD   240 mg at 08/30/17 0945  . enoxaparin (LOVENOX) injection 40 mg  40 mg Subcutaneous Daily Fuller Plan A, MD   40 mg at 08/30/17 0944  . famotidine (PEPCID) tablet 20 mg  20 mg Oral BID Blenda Nicely, RPH   20 mg at 08/30/17 0946  . furosemide (LASIX) tablet 40 mg  40 mg Oral BID Dixie Dials, MD   40 mg at 08/30/17 0951  . gabapentin (NEURONTIN) capsule 100 mg  100 mg Oral QHS Javier Glazier, MD   100 mg at 08/29/17 2341  . guaiFENesin (MUCINEX) 12 hr tablet 600 mg  600 mg Oral BID Dixie Dials, MD   600 mg at 08/30/17 0945  . guaiFENesin-dextromethorphan (ROBITUSSIN DM) 100-10 MG/5ML syrup 5 mL  5 mL Oral Q4H PRN Allie Bossier, MD   5 mL at 08/28/17 0949  . insulin aspart (novoLOG) injection 0-15 Units  0-15 Units Subcutaneous TID WC Allie Bossier, MD   3 Units at 08/30/17 1238  . ketotifen (ZADITOR) 0.025 % ophthalmic solution 1 drop  1 drop Both Eyes BID Dixie Dials, MD   1 drop at 08/30/17 0951  . levalbuterol (XOPENEX) nebulizer solution 1.25 mg  1.25 mg Nebulization TID Allie Bossier, MD   1.25 mg at 08/30/17 1435  . levothyroxine (SYNTHROID, LEVOTHROID) tablet 75 mcg  75 mcg Oral QAC breakfast Rigoberto Noel, MD   75 mcg at 08/30/17 0945  . loratadine (CLARITIN) tablet 10 mg  10 mg Oral Daily Rush Farmer, MD   10 mg at 08/30/17 0945  . metoprolol tartrate (LOPRESSOR) tablet 50 mg  50 mg Oral BID Rigoberto Noel, MD   50 mg at 08/30/17 0945  . mupirocin ointment (BACTROBAN) 2 % 1 application  1 application Nasal BID Mannam, Praveen, MD   1 application at 95/18/84 0944  . pantoprazole (PROTONIX) EC tablet 40 mg  40 mg Oral Daily Cherene Altes, MD   40 mg at 08/30/17 0946  . potassium chloride (K-DUR) CR tablet 10 mEq  10 mEq Oral BID Dixie Dials, MD   10 mEq at 08/30/17 0951  . predniSONE (DELTASONE) tablet 40 mg  40 mg Oral Q breakfast Cherene Altes, MD   40 mg at 08/30/17 0945  . prochlorperazine (COMPAZINE) tablet 10 mg  10 mg Oral Q6H PRN Dixie Dials, MD      . sertraline (ZOLOFT) tablet 100 mg  100 mg Oral Daily Rush Farmer, MD   100 mg at 08/30/17 0945     Discharge Medications: Please see discharge summary for a list of discharge medications.  Relevant Imaging Results:  Relevant Lab Results:   Additional Information SS#:[id number], [place]

## 2017-08-30 NOTE — Progress Notes (Signed)
Ref: Dixie Dials, MD   Subjective:  Feeling better. Creatinine inching up. Afebrile.  Objective:  Vital Signs in the last 24 hours: Temp:  [97.9 F (36.6 C)-98.7 F (37.1 C)] 97.9 F (36.6 C) (09/20 1229) Pulse Rate:  [76-99] 94 (09/20 1229) Cardiac Rhythm: Atrial fibrillation (09/20 0700) Resp:  [12-20] 18 (09/20 1229) BP: (99-134)/(52-78) 122/71 (09/20 1229) SpO2:  [92 %-99 %] 96 % (09/20 1229) Weight:  [128.1 kg (282 lb 4.8 oz)] 128.1 kg (282 lb 4.8 oz) (09/20 0500)  Physical Exam: BP Readings from Last 1 Encounters:  08/30/17 122/71    Wt Readings from Last 1 Encounters:  08/30/17 128.1 kg (282 lb 4.8 oz)    Weight change: -8.255 kg (-18 lb 3.2 oz) Body mass index is 46.98 kg/m. HEENT: [place]/AT, Eyes-Blue, PERL, EOMI, Conjunctiva-Pink, Sclera-Non-icteric Neck: No JVD, No bruit, Trachea midline. Lungs:  Clearing, Bilateral. Cardiac:  Regular rhythm, normal S1 and S2, no S3. II/VI systolic murmur. Abdomen:  Soft, non-tender. BS present. Extremities:  Trace edema present. No cyanosis. No clubbing. CNS: AxOx3, Cranial nerves grossly intact, moves all 4 extremities.  Skin: Warm and dry.   Intake/Output from previous day: 09/19 0701 - 09/20 0700 In: -  Out: 2100 [Urine:2100]    Lab Results: BMET    Component Value Date/Time   NA 136 08/30/2017 0621   NA 138 08/29/2017 0232   NA 139 08/28/2017 0301   K 4.2 08/30/2017 0621   K 5.0 08/29/2017 0232   K 3.8 08/28/2017 1354   CL 79 (L) 08/30/2017 0621   CL 84 (L) 08/29/2017 0232   CL 85 (L) 08/28/2017 0301   CO2 44 (H) 08/30/2017 0621   CO2 41 (H) 08/29/2017 0232   CO2 44 (H) 08/28/2017 0301   GLUCOSE 169 (H) 08/30/2017 0621   GLUCOSE 193 (H) 08/29/2017 0232   GLUCOSE 145 (H) 08/28/2017 0301   BUN 28 (H) 08/30/2017 0621   BUN 22 (H) 08/29/2017 0232   BUN 23 (H) 08/28/2017 0301   CREATININE 1.05 (H) 08/30/2017 0621   CREATININE 0.95 08/29/2017 0232   CREATININE 1.05 (H) 08/28/2017 0301   CALCIUM 8.4 (L)  08/30/2017 0621   CALCIUM 8.5 (L) 08/29/2017 0232   CALCIUM 8.3 (L) 08/28/2017 0301   GFRNONAA 55 (L) 08/30/2017 0621   GFRNONAA >60 08/29/2017 0232   GFRNONAA 55 (L) 08/28/2017 0301   GFRAA >60 08/30/2017 0621   GFRAA >60 08/29/2017 0232   GFRAA >60 08/28/2017 0301   CBC    Component Value Date/Time   WBC 9.9 08/30/2017 0621   RBC 5.23 (H) 08/30/2017 0621   HGB 11.7 (L) 08/30/2017 0621   HCT 42.0 08/30/2017 0621   PLT 272 08/30/2017 0621   MCV 80.3 08/30/2017 0621   MCH 22.4 (L) 08/30/2017 0621   MCHC 27.9 (L) 08/30/2017 0621   RDW 17.7 (H) 08/30/2017 0621   LYMPHSABS 0.6 (L) 08/25/2017 2240   MONOABS 0.2 08/25/2017 2240   EOSABS 0.0 08/25/2017 2240   BASOSABS 0.0 08/25/2017 2240   HEPATIC Function Panel  Recent Labs  07/05/17 0637 08/26/17 0233  PROT 7.2 8.2*   HEMOGLOBIN A1C No components found for: HGA1C,  MPG CARDIAC ENZYMES Lab Results  Component Value Date   CKTOTAL 96 11/12/2007   CKMB 1.8 11/12/2007   TROPONINI <0.03 08/26/2017   TROPONINI <0.03 08/26/2017   TROPONINI <0.03 08/26/2017   BNP No results for input(s): PROBNP in the last 8760 hours. TSH  Recent Labs  07/02/17 1743  TSH  3.802   CHOLESTEROL No results for input(s): CHOL in the last 8760 hours.  Scheduled Meds: . buprenorphine-naloxone  1 tablet Sublingual BID  . Chlorhexidine Gluconate Cloth  6 each Topical Q0600  . diltiazem  240 mg Oral Daily  . enoxaparin (LOVENOX) injection  40 mg Subcutaneous Daily  . famotidine  20 mg Oral BID  . furosemide  40 mg Oral BID  . gabapentin  100 mg Oral QHS  . guaiFENesin  600 mg Oral BID  . insulin aspart  0-15 Units Subcutaneous TID WC  . ketotifen  1 drop Both Eyes BID  . levalbuterol  1.25 mg Nebulization TID  . levothyroxine  75 mcg Oral QAC breakfast  . loratadine  10 mg Oral Daily  . metoprolol tartrate  50 mg Oral BID  . mupirocin ointment  1 application Nasal BID  . pantoprazole  40 mg Oral Daily  . potassium chloride  10 mEq  Oral BID  . predniSONE  40 mg Oral Q breakfast  . sertraline  100 mg Oral Daily   Continuous Infusions: PRN Meds:.acetaminophen, guaiFENesin-dextromethorphan, prochlorperazine  Assessment/Plan: Acute on chronic hypercarbic respiratory failure OSA with CPAP use Acute on chronic preserved LV systolic function left heart failure CAD Hypertension Morbid obesity DM, II Chronic atrial fibrillation GERD H/O endometrial carcinoma Left buttocks healing pressure ulcer  Change IV lasix to PO. F/U 1 week post discharge.   LOS: 4 days    Dixie Dials  MD  08/30/2017, 1:23 PM

## 2017-08-30 NOTE — Progress Notes (Signed)
Physical Therapy Treatment Patient Details Name: [name] MRN: [id number] DOB: 05-09-1956 Today's Date: 08/30/2017    History of Present Illness [name] is a 61 y.o. female with medical history significant of chronic respiratory failure with hypercapnia, systolic and diastolic CHF, DM type II, CAD, hypothyroidism, chronic pain, morbid obesity, and OSA on CPAP; who presents with complaints of shortness of breath. Patient is normally on 4 L of oxygen 24/7.    PT Comments    Patient tolerated sit to stand transfers X3, stand pivot, and ambulation of ~16ft with min A. VSS throughout session on 4L O2 via [place]. Pt continues to be limited by bilat knee pain with weightbearing but is eager to participate in therapy and mobilize more often. Continue to progress as tolerated with anticipated d/c to SNF for further skilled PT services.     Follow Up Recommendations  SNF     Equipment Recommendations  Other (comment) (to be dtermined at next venue)    Recommendations for Other Services OT consult     Precautions / Restrictions Precautions Precautions: Fall Restrictions Weight Bearing Restrictions: No    Mobility  Bed Mobility Overal bed mobility: Needs Assistance Bed Mobility: Supine to Sit     Supine to sit: HOB elevated;Supervision     General bed mobility comments: increased time and effort; use of rail; supervision for safety  Transfers Overall transfer level: Needs assistance Equipment used: Rolling walker (2 wheeled) Transfers: Sit to/from Stand Sit to Stand: Min assist         General transfer comment: assist to power up from EOB, BSC, and recliner; cues for safe hand placement  Ambulation/Gait Ambulation/Gait assistance: Min assist Ambulation Distance (Feet): 4 Feet Assistive device: Rolling walker (2 wheeled) Gait Pattern/deviations: Step-to pattern;Shuffle;Trunk flexed;Decreased step length - right;Decreased step length - left;Antalgic Gait velocity:  decreased   General Gait Details: assist to steady; cues for posture and safe proximity of RW   Stairs            Wheelchair Mobility    Modified Rankin (Stroke Patients Only)       Balance Overall balance assessment: Needs assistance Sitting-balance support: Feet supported Sitting balance-Leahy Scale: Fair     Standing balance support: Bilateral upper extremity supported Standing balance-Leahy Scale: Poor                              Cognition Arousal/Alertness: Awake/alert Behavior During Therapy: WFL for tasks assessed/performed Overall Cognitive Status: Within Functional Limits for tasks assessed                                        Exercises General Exercises - Lower Extremity Ankle Circles/Pumps: AROM;Both;20 reps;Supine Hip Flexion/Marching: AROM;Both;10 reps;Standing    General Comments        Pertinent Vitals/Pain Pain Assessment: Faces Faces Pain Scale: Hurts even more Pain Location: bilateral knee pain with movement Pain Descriptors / Indicators: Aching;Grimacing;Sore Pain Intervention(s): Limited activity within patient's tolerance;Monitored during session;Repositioned    Home Living                      Prior Function            PT Goals (current goals can now be found in the care plan section) Acute Rehab PT Goals Patient Stated Goal: get stronger PT Goal  Formulation: With patient Time For Goal Achievement: 09/11/17 Potential to Achieve Goals: Fair Progress towards PT goals: Progressing toward goals    Frequency    Min 3X/week      PT Plan Current plan remains appropriate    Co-evaluation              AM-PAC PT "6 Clicks" Daily Activity  Outcome Measure  Difficulty turning over in bed (including adjusting bedclothes, sheets and blankets)?: A Lot Difficulty moving from lying on back to sitting on the side of the bed? : A Lot Difficulty sitting down on and standing up from a  chair with arms (e.g., wheelchair, bedside commode, etc,.)?: A Little Help needed moving to and from a bed to chair (including a wheelchair)?: A Little Help needed walking in hospital room?: A Lot Help needed climbing 3-5 steps with a railing? : Total 6 Click Score: 13    End of Session Equipment Utilized During Treatment: Gait belt;Oxygen Activity Tolerance: Patient limited by pain (limited OOB mobility due to knee pain) Patient left: in chair;with call bell/phone within reach Nurse Communication: Mobility status PT Visit Diagnosis: Unsteadiness on feet (R26.81);Other abnormalities of gait and mobility (R26.89);Muscle weakness (generalized) (M62.81);Difficulty in walking, not elsewhere classified (R26.2);Pain Pain - Right/Left:  (bilateral knees L>R) Pain - part of body: Knee     Time: 1201-1227 PT Time Calculation (min) (ACUTE ONLY): 26 min  Charges:  $Gait Training: 8-22 mins $Therapeutic Activity: 8-22 mins                    G Codes:       Earney Navy, PTA Pager: [phone]     Darliss Cheney 08/30/2017, 3:07 PM

## 2017-08-30 NOTE — Progress Notes (Signed)
Clinical Social Worker facilitated patient discharge including contacting patient family and facility to confirm patient discharge plans.  Clinical information faxed to facility and family agreeable with plan.  CSW arranged ambulance transport via [facility] to [place] .  RN Anderson Malta to call [phone] (pt will go in rm#[phone]) for report prior to discharge.  Clinical Social Worker will sign off for now as social work intervention is no longer needed. Please consult [place] again if new need arises.  Rhea Pink, MSW, [place]

## 2017-08-31 ENCOUNTER — Other Ambulatory Visit: Payer: Self-pay

## 2017-08-31 LAB — CULTURE, BLOOD (ROUTINE X 2)
CULTURE: NO GROWTH
Culture: NO GROWTH
SPECIAL REQUESTS: ADEQUATE
SPECIAL REQUESTS: ADEQUATE

## 2017-08-31 MED ORDER — BUPRENORPHINE HCL 8 MG SL SUBL: 8.0000 mg | SUBLINGUAL_TABLET | Freq: Two times a day (BID) | SUBLINGUAL | 0 refills | Status: DC

## 2017-08-31 NOTE — Telephone Encounter (Signed)
Hard Rx written by Durenda Age, NP and given to nurse, Ivin Booty, by [place].

## 2017-09-04 ENCOUNTER — Encounter: Payer: Self-pay | Admitting: Internal Medicine

## 2017-09-04 ENCOUNTER — Non-Acute Institutional Stay (SKILLED_NURSING_FACILITY): Payer: Medicare Other | Admitting: Internal Medicine

## 2017-09-04 DIAGNOSIS — J9621 Acute and chronic respiratory failure with hypoxia: Secondary | ICD-10-CM

## 2017-09-04 DIAGNOSIS — J9622 Acute and chronic respiratory failure with hypercapnia: Secondary | ICD-10-CM

## 2017-09-04 DIAGNOSIS — G894 Chronic pain syndrome: Secondary | ICD-10-CM

## 2017-09-04 DIAGNOSIS — F419 Anxiety disorder, unspecified: Secondary | ICD-10-CM | POA: Diagnosis not present

## 2017-09-04 DIAGNOSIS — F329 Major depressive disorder, single episode, unspecified: Secondary | ICD-10-CM

## 2017-09-04 DIAGNOSIS — K219 Gastro-esophageal reflux disease without esophagitis: Secondary | ICD-10-CM | POA: Insufficient documentation

## 2017-09-04 DIAGNOSIS — Z9189 Other specified personal risk factors, not elsewhere classified: Secondary | ICD-10-CM | POA: Insufficient documentation

## 2017-09-04 NOTE — Assessment & Plan Note (Signed)
Change Protonix to Pepcid as per patient request

## 2017-09-04 NOTE — Assessment & Plan Note (Addendum)
Continue Buprenorphine under supervision of Dr Luana Shu & Dr Earnest Rosier

## 2017-09-04 NOTE — Progress Notes (Signed)
NURSING HOME LOCATION:  [facility] ROOM NUMBER:  [id number]  CODE STATUS:  Full Code  PCP:  Dixie Dials, MD  [place] [id number]   This is a comprehensive admission note to [facility] performed on this date less than 30 days from date of admission.   Included are preadmission medical/surgical history;reconciled medication list; family history; social history and comprehensive review of systems.  Corrections and additions to the records were documented . Comprehensive physical exam was also performed. Additionally a clinical summary was entered for each active diagnosis pertinent to this admission in the Problem List to enhance continuity of care.  HPI: The patient was hospitalized 9/15-9/20/18 with acute on chronic respiratory failure with hypoxia and hypercapnia in the setting of untreated obstructive sleep apnea and questionably obesity hypoventilation syndrome.COPD exacerbation due to rhinovirus was also questioned as etiologic factor.Encephalopathy was attributed to the hypercapnia. With BiPAP and pul monary toilet this resolved. Patient was counseled extensively on the need to continue her BiPAP and oxygen.  This was also in the context of history of chronic narcotic use, morbid obesity, COPD, congestive heart failure, and A. fib with rapid ventricular response. BiPAP was initiated at bedtime with supplemental oxygen with significant improvement in mentation. O2 was to be titrated to maintain O2 sats 89-93 percent. Pulmonary toiletwas continued aggressively. She was also discharged on 40 mg of prednisone with slow wean planned. She is to follow-up with Dr. Halford Chessman, pulmonary/CC care specialist 2-3 weeks post discharge. Outpatient sleep study was to be scheduled.  The patient had chronic systolic and diastolic heart failure and lost a total of 10.7 L while hospitalized. The patient's dry weight was to be considered 128 kg. Furosemide 40 twice a day was  continued. She was to follow-up one week post discharge with Dr. Dixie Dials She has chronic A. fib with a CHADs Vasc score of 4. Coagulation was discontinued because of bleeding while hospitalized in July of this year. Patient has diabetes with peripheral neuropathy. Her most recent A1c was 6.7 on 7/23. She is on no medications for diabetes. The patient has chronic hypochromic anemia with baseline hemoglobin of 9-10. The patient had no bleeding dyscrasias while hospitalized. Labs at discharge revealed glucose 169, BUN 28, creatinine 1.05, GFR 55, hemoglobin 11.7/hematocrit 42 The home med list includes Subutex but UDS was negative for opioids. The patient is followed in the [facility] pain clinic in [place], [place] by Dr. Luana Shu and Dr. Earnest Rosier.Apparently patient missed the last pain clinic follow-up due to this hospitalization.  Past medical and surgical history:Other medical diagnoses include nephrolithiasis, restless leg syndrome, hypothyroidism, endometrial atrial cancer, coronary artery disease, and chronic low back pain. She gives a history of prior opioid addiction. She received Narcan on at least one occasion. She previously was on methadone. Surgeries include vaginal hysterectomy and percutaneous nephrostomy  Social history:Quit smoking 1975, half pack a day for 2 years. She states that smoking was minor, while in her teens. She was exposed to secondhand smoke from her mother who was a chain smoker. Nondrinker  Family history:Limited history reviewed  Review of systems: She describes chronic joint pain. She states that the opioids have been of greatest benefit for restless leg syndrome. She had been noncompliant with her CPAP. She states that she actually lost 1 mask but also she found the mask claustrophobic. The mask ordered in the hospital has also been associated with claustrophobia. She has had yellow sputum which is decreasing in  volume and becoming lighter. She has developed a  bedsore on her buttocks. She does not find Protonix as beneficial as Zantac or Pepcid. She was previously on insulin but was able to stop this when she lost weight. When she did check her sugars at home they ranged from 100-170. She describes excess thirst. She has chronic numbness of the right hand and in the left lower extremity. The latter is related to multiple back surgeries. She describes intermittent jerking of extremities which she relates to low oxygen. Edema has essentially resolved with diuresis. She describes intermittent watery right eye.  Constitutional: No fever,significant weight change, fatigue  Eyes: No redness, discharge, pain, vision change ENT/mouth: No nasal congestion,  purulent discharge, earache,change in hearing ,sore throat  Cardiovascular: No chest pain, palpitations,paroxysmal nocturnal dyspnea, claudication, edema  Respiratory: No hemoptysis, DOE , significant snoring,apnea  Gastrointestinal: No dysphagia,abdominal pain, nausea / vomiting,rectal bleeding, melena,change in bowels Genitourinary: No dysuria,hematuria, pyuria,  incontinence, nocturia Dermatologic: No rash, pruritus Neurologic: No dizziness,headache,syncope, seizures Psychiatric: No insomnia, anorexia Endocrine: No change in hair/skin/ nails, excessive  hunger, excessive urination  Hematologic/lymphatic: No significant bruising, lymphadenopathy,abnormal bleeding Allergy/immunology: No itchy/ watery eyes, significant sneezing, urticaria, angioedema  Physical exam:  Pertinent or positive findings:She is morbidly obese. She sits in the bed rocking back and forth. She has nasal oxygen in place. She is edentulous. Breath sounds are decreased throughout. Heart rhythm is irregular. There is accentuation of the thoracic spine curvature. Abdomen is massive. Pedal pulses are decreased. She is weak to opposition especially in the left upper extremity.. The fourth and fifth right toes are bandaged as she states that  she cut her toenail "too close" with slight bleeding. There is a slightly erythematous rough textured area over the left posterior calf which blanches with pressure. There is no pain to palpation.   General appearance:Adequately nourished; no acute distress , increased work of breathing is present.   Lymphatic: No lymphadenopathy about the head, neck, axilla . Eyes: No conjunctival inflammation or lid edema is present. There is no scleral icterus. Ears:  External ear exam shows no significant lesions or deformities.   Nose:  External nasal examination shows no deformity or inflammation. Nasal mucosa are pink and moist without lesions ,exudates Oral exam: lips and gums are healthy appearing.There is no oropharyngeal erythema or exudate . Neck:  No thyromegaly, masses, tenderness noted.    Heart:  No gallop, murmur, click, rub .  Lungs: without wheezes, rhonchi,rales , rubs. Abdomen:Bowel sounds are normal. Abdomen is soft and nontender with no organomegaly, hernias,masses. GU: deferred  Extremities:  No cyanosis, clubbing,significant edema  Neurologic exam :Balance,Rhomberg,finger to nose testing could not be completed due to clinical state Skin: Warm & dry w/o tenting. No significant rash.  See clinical summary under each active problem in the Problem List with associated updated therapeutic plan

## 2017-09-04 NOTE — Assessment & Plan Note (Signed)
Pathophysiology of obstructive sleep apnea and complications if not treated discussed Follow-up Dr. Halford Chessman for sleep study and optimal mask

## 2017-09-04 NOTE — Assessment & Plan Note (Signed)
She has been informed that Buprenorphoine can be written only by outpatient physicians  certified to do so  additional refills following discharge from SNF must come from the prescribing physicians in [facility]

## 2017-09-05 DIAGNOSIS — S31000A Unspecified open wound of lower back and pelvis without penetration into retroperitoneum, initial encounter: Secondary | ICD-10-CM | POA: Diagnosis not present

## 2017-09-05 DIAGNOSIS — F419 Anxiety disorder, unspecified: Secondary | ICD-10-CM

## 2017-09-05 DIAGNOSIS — F32A Depression, unspecified: Secondary | ICD-10-CM | POA: Insufficient documentation

## 2017-09-05 DIAGNOSIS — F329 Major depressive disorder, single episode, unspecified: Secondary | ICD-10-CM | POA: Insufficient documentation

## 2017-09-05 NOTE — Assessment & Plan Note (Signed)
FAX this evaluation to [facility] for continuity of care

## 2017-09-05 NOTE — Patient Instructions (Signed)
See assessment and plan under each diagnosis in the problem list and acutely for this visit 

## 2017-09-07 ENCOUNTER — Non-Acute Institutional Stay (SKILLED_NURSING_FACILITY): Payer: Medicare Other | Admitting: Adult Health

## 2017-09-07 ENCOUNTER — Encounter: Payer: Self-pay | Admitting: Adult Health

## 2017-09-07 DIAGNOSIS — G629 Polyneuropathy, unspecified: Secondary | ICD-10-CM

## 2017-09-07 NOTE — Progress Notes (Signed)
DATE:  09/07/2017   MRN:  [id number]  BIRTHDAY: [birth year]  Facility:  Nursing Home Location:  [facility] Room Number: 211-A  LEVEL OF CARE:  SNF (31)  Contact Information    Name Relation Home Work Mobile   [place] Daughter [phone]  [phone]   Lanier Prude [phone]  [phone]   Fransico Him Daughter [phone]         Code Status History    Date Active Date Inactive Code Status Order ID Comments User Context   08/26/2017  1:27 AM 08/30/2017  9:35 PM Full Code [id number]  Norval Morton, MD ED   07/02/2017  4:12 PM 07/11/2017 10:15 PM Full Code [id number]  Erline Hau, MD ED   02/10/2016  9:44 PM 02/13/2016  6:13 PM Full Code [id number]  Dixie Dials, MD ED   12/25/2015  9:34 PM 01/02/2016  9:12 PM Full Code [id number]  Collene Gobble, MD Inpatient   11/09/2014  3:16 AM 11/11/2014  7:41 PM Full Code [id number]  Dixie Dials, MD Inpatient   04/12/2014  1:19 AM 04/14/2014 12:38 AM Full Code [id number]  Charolette Forward, MD Inpatient       Chief Complaint  Patient presents with  . Acute Visit    Neuropathy    HISTORY OF PRESENT ILLNESS:  This is a 61-YO female seen for an acute visit secondary to neuropathy.  She is a short-term rehabilitation resident of [facility].  She has a PMH of COPD, chronic respiratory failure with hypercapnia on 4L O2, OSA on CPAP, chronic systolic and diastolic CHF, CAD, type 2 DM, hypothyroidism, chronic pain syndrome, morbid obesity, and noncompliance with medication. She was seen in the room today. SShe complained of bilateral knee pain. No noted edema nor erythema.   PAST MEDICAL HISTORY:  Past Medical History:  Diagnosis Date  . Acute and chronic respiratory failure with hypercapnia ([place])   . ADD (attention deficit disorder)   . Altered mental status    2nd to hypercapnea  . CHF (congestive heart failure) ([place])   . Chronic low back pain   .  Complication of anesthesia    " I AM SLOW TO WAKE UP AT ITMES "  . Coronary artery disease   . Depression   . Diabetes mellitus   . Endometrial cancer ([place])   . GERD (gastroesophageal reflux disease)   . Hypercapnia   . Hypertension   . Hypothyroidism   . Hypoxemia   . Morbid obesity ([place])   . Nephrolithiasis   . OSA (obstructive sleep apnea)   . Paroxysmal atrial fibrillation (HCC)   . Restless leg syndrome      CURRENT MEDICATIONS: Reviewed  Patient's Medications  New Prescriptions   No medications on file  Previous Medications   BUPRENORPHINE (SUBUTEX) 8 MG SUBL SL TABLET    Place 1 tablet (8 mg total) under the tongue 2 (two) times daily.   CETIRIZINE (ZYRTEC) 10 MG TABLET    Take 10 mg by mouth at bedtime.   CLOTRIMAZOLE-BETAMETHASONE (LOTRISONE) CREAM    Apply 1 application topically daily as needed (antifungal).    DILTIAZEM (DILACOR XR) 240 MG 24 HR CAPSULE    Take 1 capsule (240 mg total) by mouth daily.   FAMOTIDINE (PEPCID) 20 MG TABLET    Take 20 mg by mouth daily.   FUROSEMIDE (LASIX) 40 MG TABLET    Take 1 tablet (40 mg total) by mouth 2 (two) times daily.  GABAPENTIN (NEURONTIN) 100 MG CAPSULE    Take 100 mg by mouth at bedtime.   GUAIFENESIN (MUCINEX) 600 MG 12 HR TABLET    Take 1 tablet (600 mg total) by mouth 2 (two) times daily.   KETOTIFEN (ZADITOR) 0.025 % OPHTHALMIC SOLUTION    Place 1 drop into both eyes 3 (three) times daily as needed.    LEVALBUTEROL (XOPENEX) 1.25 MG/0.5ML NEBULIZER SOLUTION    Take 1.25 mg by nebulization 3 (three) times daily.   METOPROLOL TARTRATE (LOPRESSOR) 50 MG TABLET    Take 1 tablet (50 mg total) by mouth 2 (two) times daily.   POTASSIUM CHLORIDE (K-DUR) 10 MEQ TABLET    Take 1 tablet (10 mEq total) by mouth 2 (two) times daily.   PREDNISONE (DELTASONE) 10 MG TABLET    Take 10 mg by mouth daily with breakfast.   PROCHLORPERAZINE (COMPAZINE) 10 MG TABLET    Take 10 mg by mouth every 6 (six) hours as needed for nausea or  vomiting.   SERTRALINE (ZOLOFT) 100 MG TABLET    Take 1 tablet (100 mg total) by mouth daily.   SYNTHROID 75 MCG TABLET    Take 75 mcg by mouth daily before breakfast.   Modified Medications   No medications on file  Discontinued Medications   No medications on file     Allergies  Allergen Reactions  . Aspirin Other (See Comments)    Chest pain  . Eliquis [Apixaban]     Excessive bleeding  . Nsaids Other (See Comments)    Chest pain  . Other Other (See Comments)    "BP med" combined with plavix, caused syncope  . Plavix [Clopidogrel] Other (See Comments)    syncope  . Sudafed [Pseudoephedrine] Palpitations    Raised BP  . Erythromycin Nausea And Vomiting  . Sulfa Antibiotics Other (See Comments)    constipation  . Sulfamethoxazole-Trimethoprim Diarrhea  . Altace [Ramipril] Other (See Comments)    Severe fatigue  . Zofran [Ondansetron Hcl] Other (See Comments)    "I put me into a coma"     REVIEW OF SYSTEMS:  GENERAL: no change in appetite, no fatigue, no fever, chills or weakness MOUTH and THROAT: Denies oral discomfort   RESPIRATORY: no cough, SOB, DOE, wheezing, hemoptysis CARDIAC: no chest pain, edema or palpitations GI: no abdominal pain, diarrhea, constipation, heart burn, nausea or vomiting GU: Denies dysuria, frequency, hematuria, incontinence, or discharge PSYCHIATRIC: Denies feeling of depression or anxiety. No report of hallucinations, insomnia, paranoia, or agitation   PHYSICAL EXAMINATION  GENERAL APPEARANCE: Well nourished. In no acute distress. Morbidly obese SKIN:  Skin is warm and dry.  MOUTH and THROAT: Lips are without lesions. Oral mucosa is moist and without lesions.  RESPIRATORY: breathing is even & unlabored, BS CTAB CARDIAC: Irregular heart rate, no murmur,no extra heart sounds, no edema GI: abdomen soft, normal BS, no masses, no tenderness, no hepatomegaly, no splenomegaly EXTREMITIES:  Able to move X 4 extremities PSYCHIATRIC: Alert and  oriented X 3. Affect and behavior are appropriate   LABS/RADIOLOGY: Labs reviewed: Basic Metabolic Panel:  Recent Labs  08/28/17 0301 08/28/17 1354 08/29/17 0232 08/30/17 0621  NA 139  --  138 136  K 3.3* 3.8 5.0 4.2  CL 85*  --  84* 79*  CO2 44*  --  41* 44*  GLUCOSE 145*  --  193* 169*  BUN 23*  --  22* 28*  CREATININE 1.05*  --  0.95 1.05*  CALCIUM 8.3*  --  8.5* 8.4*  MG  --  1.6*  --  2.0   Liver Function Tests:  Recent Labs  07/05/17 0637 08/26/17 0233  AST 17 17  ALT 13* 10*  ALKPHOS 91 96  BILITOT 0.3 0.5  PROT 7.2 8.2*  ALBUMIN 2.7* 3.3*   CBC:  Recent Labs  07/05/17 6073 07/07/17 0534 08/25/17 2240 08/26/17 0231 08/27/17 0144 08/30/17 0621  WBC 10.0 9.4 9.4  --  10.0 9.9  NEUTROABS 7.7 7.0 8.6*  --   --   --   HGB 9.7* 10.0* 10.0* 12.9 8.9* 11.7*  HCT 33.8* 34.8* 37.1 38.0 33.1* 42.0  MCV 82.8 81.7 83.6  --  81.7 80.3  PLT 240 309 237  --  282 272   Cardiac Enzymes:  Recent Labs  08/26/17 0233 08/26/17 1339 08/26/17 1540  TROPONINI <0.03 <0.03 <0.03   CBG:  Recent Labs  08/27/17 1942 08/29/17 1633 08/29/17 2136  GLUCAP 153* 153* 166*      Dg Chest Port 1 View  Result Date: 08/27/2017 CLINICAL DATA:  61 year old female with acute respiratory failure. Admitted yesterday with shortness of Breath, on home oxygen EXAM: PORTABLE CHEST 1 VIEW COMPARISON:  08/25/2017 and earlier. FINDINGS: Portable AP upright view at 0602 hours. Mildly improved lung volumes and bilateral ventilation. Decreased patchy perihilar and bilateral pulmonary interstitial opacity. No pneumothorax. Trace fluid or thickening along the right minor fissure. No consolidation. Stable cardiac size and mediastinal contours. IMPRESSION: 1. Improved lung volumes and bilateral ventilation since yesterday. Regressed bilateral perihilar and interstitial opacity which could reflect improving edema or infection. 2. No new cardiopulmonary abnormality. Electronically Signed   By: Genevie Ann M.D.   On: 08/27/2017 07:33   Dg Chest Port 1 View  Result Date: 08/25/2017 CLINICAL DATA:  Shortness of breath tonight. EXAM: PORTABLE CHEST 1 VIEW COMPARISON:  02/10/2016 FINDINGS: Low lung volumes persist. The heart is enlarged. Increased ill-defined peribronchial opacities throughout both lungs suspicious for pulmonary edema. Linear opacity in the right mid lung may be external artifact, atelectasis, or fluid in the fissure. No pneumothorax. Soft tissue attenuation from body habitus limits assessment. IMPRESSION: Low lung volumes cardiomegaly and ill-defined peribronchial opacities suspicious for pulmonary edema. Overall findings suggest CHF. Electronically Signed   By: Jeb Levering M.D.   On: 08/25/2017 23:20    ASSESSMENT/PLAN:  1. Neuropathy - increase Gabapentin 100 mg 1 capsule from Q HS to BID, monitor for drowsiness    Mistina Coatney C. Marland - NP    [employer] [phone]

## 2017-09-17 ENCOUNTER — Other Ambulatory Visit: Payer: Self-pay | Admitting: *Deleted

## 2017-09-17 DIAGNOSIS — J441 Chronic obstructive pulmonary disease with (acute) exacerbation: Secondary | ICD-10-CM

## 2017-09-17 NOTE — Patient Outreach (Signed)
[facility]) Care Management  09/17/2017  [name] 1956-10-29 [id number]   Met with patient at facility. Patient reports she is going home on Wednesday 10/10.  She is concerned about going home as she does not have a lot of support at home, her daughter lives at home with her but works and is not able to assist her. She reports she has a bed sore on her bottom, needs a special mattress and wound care supplies. She confirms she has Medicaid and is hoping it will assist her with covering the co-pays and costs.  Patient met with Cameron Ali, SW at facility and she is going to set up some referrals for DME, mattress, BSC.  Also, patient reports she has Transportation and PCS needs, she has full Medicaid. She is not sure that [facility] would work on these concerns. Patient has history of Afib, COPD-oxygen dependent, heart failure.    RNCM reviewed [facility] care management services, patient agrees, consent obtained. Packet with patient consent copy left at bedside.   RNCM left [facility] a message that patient will need PCS and transportation. Also, that Patient signed up for [facility] care management services.  Plan to refer to [facility] care management for Transition of Care and SW follow up after patient discharges.  Royetta Crochet. Laymond Purser, RN, BSN, [place] [phone]) Business Cell  [phone]) Toll Free Office

## 2017-09-18 ENCOUNTER — Other Ambulatory Visit: Payer: Self-pay | Admitting: *Deleted

## 2017-09-18 ENCOUNTER — Non-Acute Institutional Stay (SKILLED_NURSING_FACILITY): Payer: Medicare Other | Admitting: Adult Health

## 2017-09-18 ENCOUNTER — Encounter: Payer: Self-pay | Admitting: *Deleted

## 2017-09-18 ENCOUNTER — Encounter: Payer: Self-pay | Admitting: Adult Health

## 2017-09-18 DIAGNOSIS — J9611 Chronic respiratory failure with hypoxia: Secondary | ICD-10-CM | POA: Diagnosis not present

## 2017-09-18 DIAGNOSIS — G894 Chronic pain syndrome: Secondary | ICD-10-CM

## 2017-09-18 DIAGNOSIS — F419 Anxiety disorder, unspecified: Secondary | ICD-10-CM

## 2017-09-18 DIAGNOSIS — E039 Hypothyroidism, unspecified: Secondary | ICD-10-CM

## 2017-09-18 DIAGNOSIS — I482 Chronic atrial fibrillation, unspecified: Secondary | ICD-10-CM

## 2017-09-18 DIAGNOSIS — E876 Hypokalemia: Secondary | ICD-10-CM

## 2017-09-18 DIAGNOSIS — I5042 Chronic combined systolic (congestive) and diastolic (congestive) heart failure: Secondary | ICD-10-CM | POA: Diagnosis not present

## 2017-09-18 DIAGNOSIS — J449 Chronic obstructive pulmonary disease, unspecified: Secondary | ICD-10-CM

## 2017-09-18 DIAGNOSIS — G4733 Obstructive sleep apnea (adult) (pediatric): Secondary | ICD-10-CM

## 2017-09-18 DIAGNOSIS — K219 Gastro-esophageal reflux disease without esophagitis: Secondary | ICD-10-CM

## 2017-09-18 DIAGNOSIS — R5381 Other malaise: Secondary | ICD-10-CM | POA: Diagnosis not present

## 2017-09-18 DIAGNOSIS — I10 Essential (primary) hypertension: Secondary | ICD-10-CM

## 2017-09-18 DIAGNOSIS — F329 Major depressive disorder, single episode, unspecified: Secondary | ICD-10-CM

## 2017-09-18 DIAGNOSIS — F32A Depression, unspecified: Secondary | ICD-10-CM

## 2017-09-18 DIAGNOSIS — L89322 Pressure ulcer of left buttock, stage 2: Secondary | ICD-10-CM

## 2017-09-18 MED ORDER — KETOTIFEN FUMARATE 0.025 % OP SOLN: 1.0000 [drp] | Freq: Three times a day (TID) | OPHTHALMIC | 0 refills | Status: DC | PRN

## 2017-09-18 MED ORDER — SYNTHROID 75 MCG PO TABS: 75.0000 ug | ORAL_TABLET | Freq: Every day | ORAL | 0 refills | Status: DC

## 2017-09-18 MED ORDER — GABAPENTIN 100 MG PO CAPS: 100.0000 mg | ORAL_CAPSULE | Freq: Two times a day (BID) | ORAL | 0 refills | Status: AC

## 2017-09-18 MED ORDER — SERTRALINE HCL 100 MG PO TABS: 100.0000 mg | ORAL_TABLET | Freq: Every day | ORAL | 0 refills | Status: AC

## 2017-09-18 MED ORDER — CLOTRIMAZOLE-BETAMETHASONE 1-0.05 % EX CREA: 1.0000 "application " | TOPICAL_CREAM | Freq: Every day | CUTANEOUS | 0 refills | Status: DC | PRN

## 2017-09-18 MED ORDER — FUROSEMIDE 40 MG PO TABS: 40.0000 mg | ORAL_TABLET | Freq: Two times a day (BID) | ORAL | 0 refills | Status: DC

## 2017-09-18 MED ORDER — METOPROLOL TARTRATE 50 MG PO TABS: 50.0000 mg | ORAL_TABLET | Freq: Two times a day (BID) | ORAL | 0 refills | Status: DC

## 2017-09-18 MED ORDER — PROCHLORPERAZINE MALEATE 10 MG PO TABS: 10.0000 mg | ORAL_TABLET | Freq: Four times a day (QID) | ORAL | 0 refills | Status: AC | PRN

## 2017-09-18 MED ORDER — LEVALBUTEROL HCL 1.25 MG/0.5ML IN NEBU: 1.2500 mg | INHALATION_SOLUTION | Freq: Three times a day (TID) | RESPIRATORY_TRACT | 0 refills | Status: DC | PRN

## 2017-09-18 MED ORDER — DILTIAZEM HCL ER 240 MG PO CP24: 240.0000 mg | ORAL_CAPSULE | Freq: Every day | ORAL | 0 refills | Status: DC

## 2017-09-18 MED ORDER — POTASSIUM CHLORIDE ER 10 MEQ PO TBCR: 10.0000 meq | EXTENDED_RELEASE_TABLET | Freq: Two times a day (BID) | ORAL | 0 refills | Status: DC

## 2017-09-18 NOTE — Progress Notes (Signed)
DATE:  09/18/2017   MRN:  [id number]  BIRTHDAY: 1956-03-10  Facility:  Nursing Home Location:  [facility] Room Number: 211-A  LEVEL OF CARE:  SNF (31)  Contact Information    Name Relation Home Work Mobile   [place] Daughter [phone]  [phone]   Lanier Prude [phone]  [phone]   Fransico Him Daughter [phone]         Code Status History    Date Active Date Inactive Code Status Order ID Comments User Context   08/26/2017  1:27 AM 08/30/2017  9:35 PM Full Code [id number]  Norval Morton, MD ED   07/02/2017  4:12 PM 07/11/2017 10:15 PM Full Code [id number]  Erline Hau, MD ED   02/10/2016  9:44 PM 02/13/2016  6:13 PM Full Code [id number]  Dixie Dials, MD ED   12/25/2015  9:34 PM 01/02/2016  9:12 PM Full Code [id number]  Collene Gobble, MD Inpatient   11/09/2014  3:16 AM 11/11/2014  7:41 PM Full Code [id number]  Dixie Dials, MD Inpatient   04/12/2014  1:19 AM 04/14/2014 12:38 AM Full Code [id number]  Charolette Forward, MD Inpatient       Chief Complaint  Patient presents with  . Discharge Note    Patient is discharging home    HISTORY OF PRESENT ILLNESS:  This is a 61-YO female seen for a discharge visit.  She will discharge home on 09/19/17 with home health OT, PT, and N+ursing services.  She has a PMH of COPD, chronic respiratory failure with hypercapnia on 4L O2, chronic systolic and diastolic CHF, CAD, type 2 DM, hypothyroidism, chronic pain syndrome, morbid obesity, and medication noncompliance.    She has been admitted to [facility] on 08/30/17 from [facility] admission dates 08/25/17 to 08/30/17 and was treated for for acute on chronic respiratory failure with hypoxia and hypercapnia secondary to untreated OSA/OHS, exacerbated by COPD exacerbation secondary to rhinovirus. She was placed on BiPAP, O2, and medication adjusted for chronic systolic and diastolic CHF. She was  diuresed and responded well.   Patient was admitted to this facility for short-term rehabilitation after the patient's recent hospitalization.  Patient has completed SNF rehabilitation and therapy has cleared the patient for discharge.   PAST MEDICAL HISTORY:  Past Medical History:  Diagnosis Date  . Acute and chronic respiratory failure with hypercapnia ([place])   . ADD (attention deficit disorder)   . Altered mental status    2nd to hypercapnea  . CHF (congestive heart failure) ([place])   . Chronic low back pain   . Complication of anesthesia    " I AM SLOW TO WAKE UP AT ITMES "  . Coronary artery disease   . Depression   . Diabetes mellitus   . Endometrial cancer ([place])   . GERD (gastroesophageal reflux disease)   . Hypercapnia   . Hypertension   . Hypothyroidism   . Hypoxemia   . Morbid obesity ([place])   . Nephrolithiasis   . OSA (obstructive sleep apnea)   . Paroxysmal atrial fibrillation (HCC)   . Restless leg syndrome      CURRENT MEDICATIONS: Reviewed  Patient's Medications  New Prescriptions   No medications on file  Previous Medications   BUPRENORPHINE (SUBUTEX) 8 MG SUBL SL TABLET    Place 1 tablet (8 mg total) under the tongue 2 (two) times daily.   CETIRIZINE (ZYRTEC) 10 MG TABLET    Take 10 mg by mouth  at bedtime.   CLOTRIMAZOLE-BETAMETHASONE (LOTRISONE) CREAM    Apply 1 application topically daily as needed (antifungal).    DILTIAZEM (DILACOR XR) 240 MG 24 HR CAPSULE    Take 1 capsule (240 mg total) by mouth daily.   FAMOTIDINE (PEPCID) 20 MG TABLET    Take 20 mg by mouth daily.   FUROSEMIDE (LASIX) 40 MG TABLET    Take 1 tablet (40 mg total) by mouth 2 (two) times daily.   GABAPENTIN (NEURONTIN) 100 MG CAPSULE    Take 100 mg by mouth 2 (two) times daily.    KETOTIFEN (ZADITOR) 0.025 % OPHTHALMIC SOLUTION    Place 1 drop into both eyes 3 (three) times daily as needed.    LEVALBUTEROL (XOPENEX) 1.25 MG/0.5ML NEBULIZER SOLUTION    Take 1.25 mg by nebulization daily  as needed for wheezing or shortness of breath.   METOPROLOL TARTRATE (LOPRESSOR) 50 MG TABLET    Take 1 tablet (50 mg total) by mouth 2 (two) times daily.   POTASSIUM CHLORIDE (K-DUR) 10 MEQ TABLET    Take 1 tablet (10 mEq total) by mouth 2 (two) times daily.   PROCHLORPERAZINE (COMPAZINE) 10 MG TABLET    Take 10 mg by mouth every 6 (six) hours as needed for nausea or vomiting.   SERTRALINE (ZOLOFT) 100 MG TABLET    Take 1 tablet (100 mg total) by mouth daily.   SYNTHROID 75 MCG TABLET    Take 75 mcg by mouth daily before breakfast.   Modified Medications   No medications on file  Discontinued Medications   GUAIFENESIN (MUCINEX) 600 MG 12 HR TABLET    Take 1 tablet (600 mg total) by mouth 2 (two) times daily.   LEVALBUTEROL (XOPENEX) 1.25 MG/0.5ML NEBULIZER SOLUTION    Take 1.25 mg by nebulization 3 (three) times daily.   PREDNISONE (DELTASONE) 10 MG TABLET    Take 10 mg by mouth daily with breakfast.     Allergies  Allergen Reactions  . Aspirin Other (See Comments)    Chest pain  . Eliquis [Apixaban]     Excessive bleeding  . Nsaids Other (See Comments)    Chest pain  . Other Other (See Comments)    "BP med" combined with plavix, caused syncope  . Plavix [Clopidogrel] Other (See Comments)    syncope  . Sudafed [Pseudoephedrine] Palpitations    Raised BP  . Erythromycin Nausea And Vomiting  . Sulfa Antibiotics Other (See Comments)    constipation  . Sulfamethoxazole-Trimethoprim Diarrhea  . Altace [Ramipril] Other (See Comments)    Severe fatigue  . Zofran [Ondansetron Hcl] Other (See Comments)    "I put me into a coma"     REVIEW OF SYSTEMS:  GENERAL: no change in appetite, no fatigue, no weight changes, no fever, chills or weakness MOUTH and THROAT: Denies oral discomfort, gingival pain or bleeding, pain from teeth or hoarseness   RESPIRATORY: no cough, SOB, DOE, wheezing, hemoptysis CARDIAC: no chest pain, edema or palpitations GI: no abdominal pain, diarrhea,  constipation, heart burn, nausea or vomiting GU: Denies dysuria, frequency, hematuria, incontinence, or discharge PSYCHIATRIC: Denies feeling of depression or anxiety. No report of hallucinations, insomnia, paranoia, or agitation     PHYSICAL EXAMINATION  GENERAL APPEARANCE: Well nourished. In no acute distress. Morbidly obese SKIN:  Left inner buttock stage 2 pressure ulcer covered with dressing MOUTH and THROAT: Lips are without lesions. Oral mucosa is moist and without lesions. RESPIRATORY: breathing is even & unlabored, BS CTAB CARDIAC: Irregular  heart rate, no murmur,no extra heart sounds, no edema GI: abdomen soft, normal BS, no masses, no tenderness EXTREMITIES:  Able to move X 4 extremities PSYCHIATRIC: Alert and oriented X 3. Affect and behavior are appropriate   LABS/RADIOLOGY: Labs reviewed: Basic Metabolic Panel:  Recent Labs  08/28/17 0301 08/28/17 1354 08/29/17 0232 08/30/17 0621  NA 139  --  138 136  K 3.3* 3.8 5.0 4.2  CL 85*  --  84* 79*  CO2 44*  --  41* 44*  GLUCOSE 145*  --  193* 169*  BUN 23*  --  22* 28*  CREATININE 1.05*  --  0.95 1.05*  CALCIUM 8.3*  --  8.5* 8.4*  MG  --  1.6*  --  2.0   Liver Function Tests:  Recent Labs  07/05/17 0637 08/26/17 0233  AST 17 17  ALT 13* 10*  ALKPHOS 91 96  BILITOT 0.3 0.5  PROT 7.2 8.2*  ALBUMIN 2.7* 3.3*   CBC:  Recent Labs  07/05/17 0637 07/07/17 0534 08/25/17 2240 08/26/17 0231 08/27/17 0144 08/30/17 0621  WBC 10.0 9.4 9.4  --  10.0 9.9  NEUTROABS 7.7 7.0 8.6*  --   --   --   HGB 9.7* 10.0* 10.0* 12.9 8.9* 11.7*  HCT 33.8* 34.8* 37.1 38.0 33.1* 42.0  MCV 82.8 81.7 83.6  --  81.7 80.3  PLT 240 309 237  --  282 272   Cardiac Enzymes:  Recent Labs  08/26/17 0233 08/26/17 1339 08/26/17 1540  TROPONINI <0.03 <0.03 <0.03   CBG:  Recent Labs  08/27/17 1942 08/29/17 1633 08/29/17 2136  GLUCAP 153* 153* 166*      Dg Chest Port 1 View  Result Date: 08/27/2017 CLINICAL DATA:   61 year old female with acute respiratory failure. Admitted yesterday with shortness of Breath, on home oxygen EXAM: PORTABLE CHEST 1 VIEW COMPARISON:  08/25/2017 and earlier. FINDINGS: Portable AP upright view at 0602 hours. Mildly improved lung volumes and bilateral ventilation. Decreased patchy perihilar and bilateral pulmonary interstitial opacity. No pneumothorax. Trace fluid or thickening along the right minor fissure. No consolidation. Stable cardiac size and mediastinal contours. IMPRESSION: 1. Improved lung volumes and bilateral ventilation since yesterday. Regressed bilateral perihilar and interstitial opacity which could reflect improving edema or infection. 2. No new cardiopulmonary abnormality. Electronically Signed   By: Genevie Ann M.D.   On: 08/27/2017 07:33   Dg Chest Port 1 View  Result Date: 08/25/2017 CLINICAL DATA:  Shortness of breath tonight. EXAM: PORTABLE CHEST 1 VIEW COMPARISON:  02/10/2016 FINDINGS: Low lung volumes persist. The heart is enlarged. Increased ill-defined peribronchial opacities throughout both lungs suspicious for pulmonary edema. Linear opacity in the right mid lung may be external artifact, atelectasis, or fluid in the fissure. No pneumothorax. Soft tissue attenuation from body habitus limits assessment. IMPRESSION: Low lung volumes cardiomegaly and ill-defined peribronchial opacities suspicious for pulmonary edema. Overall findings suggest CHF. Electronically Signed   By: Jeb Levering M.D.   On: 08/25/2017 23:20    ASSESSMENT/PLAN:  1. Physical deconditioning - for home health PT and OT, for therapeutic strengthening exercises; fall precautions   2. Chronic respiratory failure with hypoxia (HCC) - continue O2 4 L/minute via [place] continuously - levalbuterol (XOPENEX) 1.25 MG/0.5ML nebulizer solution; Take 1.25 mg by nebulization 3 (three) times daily as needed for wheezing or shortness of breath.  Dispense: 30 each; Refill: 0  3. Chronic pain syndrome -  well-controlled, she follows up with a pain clinic in [place], [place] who  will give her the prescription for Subutex 8 mg 1 tab SL BID - gabapentin (NEURONTIN) 100 MG capsule; Take 1 capsule (100 mg total) by mouth 2 (two) times daily.  Dispense: 60 capsule; Refill: 0  4. Systolic and diastolic CHF, chronic (HCC) - no SOB, follow- up with cardiologist, Dr. Dixie Dials, in 1 week  - diltiazem (DILACOR XR) 240 MG 24 hr capsule; Take 1 capsule (240 mg total) by mouth daily.  Dispense: 30 capsule; Refill: 0 - furosemide (LASIX) 40 MG tablet; Take 1 tablet (40 mg total) by mouth 2 (two) times daily.  Dispense: 60 tablet; Refill: 0 - metoprolol tartrate (LOPRESSOR) 50 MG tablet; Take 1 tablet (50 mg total) by mouth 2 (two) times daily.  Dispense: 60 tablet; Refill: 0  5. Essential hypertension -  Well-controlled  - diltiazem (DILACOR XR) 240 MG 24 hr capsule; Take 1 capsule (240 mg total) by mouth daily.  Dispense: 30 capsule; Refill: 0 - metoprolol tartrate (LOPRESSOR) 50 MG tablet; Take 1 tablet (50 mg total) by mouth 2 (two) times daily.  Dispense: 60 tablet; Refill: 0  6. Chronic atrial fibrillation (HCC) - rate-controlled, not on any anticoagulation due to history of bleeding  - diltiazem (DILACOR XR) 240 MG 24 hr capsule; Take 1 capsule (240 mg total) by mouth daily.  Dispense: 30 capsule; Refill: 0 - metoprolol tartrate (LOPRESSOR) 50 MG tablet; Take 1 tablet (50 mg total) by mouth 2 (two) times daily.  Dispense: 60 tablet; Refill: 0  7. OSA (obstructive sleep apnea) - continue BiPAP 14/5 @ 40%   8. Chronic obstructive pulmonary disease, unspecified COPD type ([place]) - no wheezing, continue O2 @ 4L/min via [place] continuously, continue Cetirizine 10 mg 1 tab Q HS, discontinued Prednisone, follow up with Dr. Chesley Mires, pulmonologist, in 2 weeks  - levalbuterol (XOPENEX) 1.25 MG/0.5ML nebulizer solution; Take 1.25 mg by nebulization 3 (three) times daily as needed for wheezing or shortness of breath.   Dispense: 30 each; Refill: 0  9. Anxiety and depression - mood is stable  - sertraline (ZOLOFT) 100 MG tablet; Take 1 tablet (100 mg total) by mouth daily.  Dispense: 30 tablet; Refill: 0  10. Gastroesophageal reflux disease without esophagitis - stable, continue Pepcid 20 mg 1 tab daily   11. Pressure injury of left buttock, stage 2 - continue treatment and monitoring for  infection   12. Obesity, Class III, BMI 40-49.9 (morbid obesity) ([place]) - counseled regarding weight loss   13. Hypothyroidism, unspecified type Lab Results  Component Value Date   TSH 3.802 07/02/2017   - SYNTHROID 75 MCG tablet; Take 1 tablet (75 mcg total) by mouth daily before breakfast.  Dispense: 30 tablet; Refill: 0  14. Hypokalemia - stable Lab Results  Component Value Date   K 4.2 08/30/2017   - potassium chloride (K-DUR) 10 MEQ tablet; Take 1 tablet (10 mEq total) by mouth 2 (two) times daily.  Dispense: 60 tablet; Refill: 0     I have filled out patient's discharge paperwork and written prescriptions.  Patient will receive home health PT, OT and skilled Nursing.  DME provided:  3-in-1, gel overlay mattress, nebulizer with tubing, filter and mask   Total discharge time: Greater than 30 minutes Greater than 50% was spent in counseling and coordination of care.   Discharge time involved coordination of the discharge process with social worker, nursing staff and therapy department. Medical justification for home health services/DME verified.     Monina C. Medina-Vargas - NP  [place] [phone]

## 2017-09-18 NOTE — Patient Outreach (Signed)
[facility]) Care Management  09/18/2017  [name] 11/06/1956 [id number]   CSW made an initial attempt to try and contact patient today at [facility], [place] where patient currently resides to receive short-term rehabilitative services; however, patient was unavailable at the time of CSW's call.  A HIPAA compliant message was left for patient with patient's attending nurse at [facility] is currently awaiting a return call.  CSW will make a second outreach attempt within the next week, if CSW does not receive a return call from patient in the meantime. Nat Christen, BSW, MSW, LCSW  Licensed Education officer, environmental Health System  Mailing [place] [address], [place], [place] [address] Physical Address-[address], [place],  [address] Toll Free Main # [phone] Fax # [phone] Cell # [phone]  Office # [phone] [email or web address]

## 2017-09-21 ENCOUNTER — Other Ambulatory Visit: Payer: Self-pay

## 2017-09-21 NOTE — Telephone Encounter (Signed)
This encounter was created in error - please disregard.
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
## 2017-09-24 ENCOUNTER — Other Ambulatory Visit: Payer: Self-pay

## 2017-09-25 DIAGNOSIS — I5041 Acute combined systolic (congestive) and diastolic (congestive) heart failure: Secondary | ICD-10-CM | POA: Diagnosis not present

## 2017-09-25 DIAGNOSIS — J449 Chronic obstructive pulmonary disease, unspecified: Secondary | ICD-10-CM | POA: Diagnosis not present

## 2017-09-25 DIAGNOSIS — L89321 Pressure ulcer of left buttock, stage 1: Secondary | ICD-10-CM | POA: Diagnosis not present

## 2017-09-25 DIAGNOSIS — E119 Type 2 diabetes mellitus without complications: Secondary | ICD-10-CM | POA: Diagnosis not present

## 2017-09-25 DIAGNOSIS — G894 Chronic pain syndrome: Secondary | ICD-10-CM | POA: Diagnosis not present

## 2017-09-25 DIAGNOSIS — I5043 Acute on chronic combined systolic (congestive) and diastolic (congestive) heart failure: Secondary | ICD-10-CM | POA: Diagnosis not present

## 2017-09-25 DIAGNOSIS — I110 Hypertensive heart disease with heart failure: Secondary | ICD-10-CM | POA: Diagnosis not present

## 2017-09-26 ENCOUNTER — Encounter: Payer: Self-pay | Admitting: *Deleted

## 2017-09-26 ENCOUNTER — Other Ambulatory Visit: Payer: Self-pay | Admitting: *Deleted

## 2017-09-26 NOTE — Patient Outreach (Signed)
[facility]) Care Management  09/26/2017  IRAIS MOTTRAM January 27, 1956 [id number]  CSW was able to make initial contact with patient today to perform phone assessment, as well as assess and assist with social work needs and services.  CSW introduced self, explained role and types of services provided through [place] Management ([place] [facility]).  CSW further explained to patient that CSW works with Burgess Amor, [place], also with [place] Management. CSW then explained the reason for the call, indicating that Mrs. Niemczura thought that patient would benefit from social work services and resources to assist with arranging transportation to and from physician appointments, as well as completing an application for [employer] Youth worker) through [employer].  CSW obtained two HIPAA compliant identifiers from patient, which included patient's name and date of birth. Patient admitted that she is managing well at home, adjusting to living alone again and performing activities of daily living independently. Patient was discharged from [facility] on October 10th, where she was receiving short-term rehabilitative services.  Upon discharge from [place], home health nursing, physical therapy, occupational therapy and social work services were arranged for patient through [facility].  Patient admits to regaining her strength and mobility through therapies, both physical and occupational.  However, patient was interested in applying for PCS, admitting that she is still in need of assistance with light housekeeping duties, meal preparation, grocery shopping, etc.  CSW agreed to mail patient a PCS application and assistance with completion, if necessary.   Patient reported that she had SCAT in the past, but has since let her application lapse, needing to reapply to get recertified.  CSW also agreed to  mail patient a SCAT application, as patient reported that she is able to complete paperwork independently.  CSW provided patient with two additional transportation resources, which include [employer], through the [place], and [employer], through [employer] [facility] [place].  Patient has the contact information for both and knows the process for arranging transportation services. CSW agreed to follow-up with patient in one week to ensure that she received the packet of resource information mailed to her home, as well as answer any questions she may have at that time.  CSW also agreed to schedule a home visit with patient to assist with completion of applications, if patient is agreeable.  Patient voiced understanding and was agreeable to this plan.  No additional social work needs have been identified at this time. Nat Christen, BSW, MSW, LCSW  Licensed Education officer, environmental Health System  Mailing [place] [address] [place], [place] [address] Physical Address[address], [place], [place] [address] Toll Free Main # [phone] Fax # [phone] Cell # [phone]  Office # [phone] [email or web address]
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
## 2017-09-26 NOTE — Patient Outreach (Signed)
Request received from Joanna Saporito, LCSW to mail patient personal care resources.  Information mailed today. 

## 2017-09-27 DIAGNOSIS — E119 Type 2 diabetes mellitus without complications: Secondary | ICD-10-CM | POA: Diagnosis not present

## 2017-09-27 DIAGNOSIS — I110 Hypertensive heart disease with heart failure: Secondary | ICD-10-CM | POA: Diagnosis not present

## 2017-09-27 DIAGNOSIS — J449 Chronic obstructive pulmonary disease, unspecified: Secondary | ICD-10-CM | POA: Diagnosis not present

## 2017-09-27 DIAGNOSIS — L89321 Pressure ulcer of left buttock, stage 1: Secondary | ICD-10-CM | POA: Diagnosis not present

## 2017-09-27 DIAGNOSIS — I5043 Acute on chronic combined systolic (congestive) and diastolic (congestive) heart failure: Secondary | ICD-10-CM | POA: Diagnosis not present

## 2017-09-27 DIAGNOSIS — G894 Chronic pain syndrome: Secondary | ICD-10-CM | POA: Diagnosis not present

## 2017-09-28 ENCOUNTER — Other Ambulatory Visit: Payer: Self-pay

## 2017-09-28 NOTE — Patient Outreach (Signed)
[facility]) Care Management  Late entry for 09/24/2017  [name] 06-06-1956 [id number]  Second attempt to reach patient without success. Left HIPAA compliant voicemail with RNCM contact information and invited patient to return call.  Anna R. Courtnee Myer, RN, BSN, [place] Management Coordinator [phone]

## 2017-09-28 NOTE — Patient Outreach (Signed)
[facility]) Care Management  09/28/17  Diamon Reddinger Nelligan September 23, 1956 [id number]   Attempted to reach patient without success.Left HIPAA compliant voicemail with RNCM contact information and invited patient to return call.  [name] R. Jamaiya Tunnell, RN, BSN, [place] Management Coordinator [phone]

## 2017-10-01 DIAGNOSIS — G894 Chronic pain syndrome: Secondary | ICD-10-CM | POA: Diagnosis not present

## 2017-10-01 DIAGNOSIS — I110 Hypertensive heart disease with heart failure: Secondary | ICD-10-CM | POA: Diagnosis not present

## 2017-10-01 DIAGNOSIS — J449 Chronic obstructive pulmonary disease, unspecified: Secondary | ICD-10-CM | POA: Diagnosis not present

## 2017-10-01 DIAGNOSIS — E119 Type 2 diabetes mellitus without complications: Secondary | ICD-10-CM | POA: Diagnosis not present

## 2017-10-01 DIAGNOSIS — L89321 Pressure ulcer of left buttock, stage 1: Secondary | ICD-10-CM | POA: Diagnosis not present

## 2017-10-01 DIAGNOSIS — I5043 Acute on chronic combined systolic (congestive) and diastolic (congestive) heart failure: Secondary | ICD-10-CM | POA: Diagnosis not present

## 2017-10-03 DIAGNOSIS — L89321 Pressure ulcer of left buttock, stage 1: Secondary | ICD-10-CM | POA: Diagnosis not present

## 2017-10-03 DIAGNOSIS — I5043 Acute on chronic combined systolic (congestive) and diastolic (congestive) heart failure: Secondary | ICD-10-CM | POA: Diagnosis not present

## 2017-10-03 DIAGNOSIS — E119 Type 2 diabetes mellitus without complications: Secondary | ICD-10-CM | POA: Diagnosis not present

## 2017-10-03 DIAGNOSIS — I110 Hypertensive heart disease with heart failure: Secondary | ICD-10-CM | POA: Diagnosis not present

## 2017-10-03 DIAGNOSIS — G894 Chronic pain syndrome: Secondary | ICD-10-CM | POA: Diagnosis not present

## 2017-10-03 DIAGNOSIS — J449 Chronic obstructive pulmonary disease, unspecified: Secondary | ICD-10-CM | POA: Diagnosis not present

## 2017-10-04 ENCOUNTER — Other Ambulatory Visit: Payer: Self-pay | Admitting: *Deleted

## 2017-10-04 NOTE — Patient Outreach (Signed)
[facility]) Care Management  10/04/2017  [name] 1956/11/05 [id number]   CSW was able to make contact with patient today to follow-up regarding social work services and resources, as well as ensure that patient received the packet of resources mailed to her home by CSW.  Patient admitted to receiving the packet of resource information, but denied having any questions about any of the information received.  Nor did patient think that she needed assistance with completion of referrals or application processes.  Patient agreed to utilize [employer] for all physician appointments, as well as Armed forces technical officer through [employer] [facility] [place] when and if [employer] is unavailable.  As a last resort, patient can utilize [employer] Paramedic) through [employer] after renewing her application.  Patient agreed to complete the PCS ([place]) through [facility] application, as well as the SCAT application if she is interested in receiving services. CSW will perform a case closure on patient, as all goals of treatment have been met from social work standpoint and no additional social work needs have been identified at this time.  CSW will notify patient's RNCM with [place] Management, [place]ish [employer] of CSW's plans to close patient's case.  CSW will fax an update to patient's Primary Care Physician, Dr. Dixie Dials to ensure that they are aware of CSW's involvement with patient's plan of care.  CSW will submit a case closure request to Verlon Setting, Care Management Assistant with [place] Management, in the form of an [employer].  CSW will ensure that [name] is aware of [name]'s, RNCM with [employer], continued involvement with patient's care. Nat Christen, BSW, MSW, LCSW   Licensed Education officer, environmental Health System  Mailing [place] [address], [place], [place] [address] Physical Address-[address], [place], [place] [address] Toll Free Main # [phone] Fax # [phone] Cell # [phone]  Office # [phone] [email or web address]

## 2017-10-05 DIAGNOSIS — I110 Hypertensive heart disease with heart failure: Secondary | ICD-10-CM | POA: Diagnosis not present

## 2017-10-05 DIAGNOSIS — J449 Chronic obstructive pulmonary disease, unspecified: Secondary | ICD-10-CM | POA: Diagnosis not present

## 2017-10-05 DIAGNOSIS — G894 Chronic pain syndrome: Secondary | ICD-10-CM | POA: Diagnosis not present

## 2017-10-05 DIAGNOSIS — E119 Type 2 diabetes mellitus without complications: Secondary | ICD-10-CM | POA: Diagnosis not present

## 2017-10-05 DIAGNOSIS — L89321 Pressure ulcer of left buttock, stage 1: Secondary | ICD-10-CM | POA: Diagnosis not present

## 2017-10-05 DIAGNOSIS — M170 Bilateral primary osteoarthritis of knee: Secondary | ICD-10-CM | POA: Diagnosis not present

## 2017-10-05 DIAGNOSIS — I5043 Acute on chronic combined systolic (congestive) and diastolic (congestive) heart failure: Secondary | ICD-10-CM | POA: Diagnosis not present

## 2017-10-08 DIAGNOSIS — I482 Chronic atrial fibrillation: Secondary | ICD-10-CM | POA: Diagnosis not present

## 2017-10-08 DIAGNOSIS — E119 Type 2 diabetes mellitus without complications: Secondary | ICD-10-CM | POA: Diagnosis not present

## 2017-10-08 DIAGNOSIS — I5032 Chronic diastolic (congestive) heart failure: Secondary | ICD-10-CM | POA: Diagnosis not present

## 2017-10-08 DIAGNOSIS — I2510 Atherosclerotic heart disease of native coronary artery without angina pectoris: Secondary | ICD-10-CM | POA: Diagnosis not present

## 2017-10-09 DIAGNOSIS — I5043 Acute on chronic combined systolic (congestive) and diastolic (congestive) heart failure: Secondary | ICD-10-CM | POA: Diagnosis not present

## 2017-10-09 DIAGNOSIS — J449 Chronic obstructive pulmonary disease, unspecified: Secondary | ICD-10-CM | POA: Diagnosis not present

## 2017-10-09 DIAGNOSIS — E119 Type 2 diabetes mellitus without complications: Secondary | ICD-10-CM | POA: Diagnosis not present

## 2017-10-09 DIAGNOSIS — L89321 Pressure ulcer of left buttock, stage 1: Secondary | ICD-10-CM | POA: Diagnosis not present

## 2017-10-09 DIAGNOSIS — I110 Hypertensive heart disease with heart failure: Secondary | ICD-10-CM | POA: Diagnosis not present

## 2017-10-09 DIAGNOSIS — G894 Chronic pain syndrome: Secondary | ICD-10-CM | POA: Diagnosis not present

## 2017-10-10 DIAGNOSIS — I110 Hypertensive heart disease with heart failure: Secondary | ICD-10-CM | POA: Diagnosis not present

## 2017-10-10 DIAGNOSIS — L89321 Pressure ulcer of left buttock, stage 1: Secondary | ICD-10-CM | POA: Diagnosis not present

## 2017-10-10 DIAGNOSIS — E119 Type 2 diabetes mellitus without complications: Secondary | ICD-10-CM | POA: Diagnosis not present

## 2017-10-10 DIAGNOSIS — I5043 Acute on chronic combined systolic (congestive) and diastolic (congestive) heart failure: Secondary | ICD-10-CM | POA: Diagnosis not present

## 2017-10-10 DIAGNOSIS — G894 Chronic pain syndrome: Secondary | ICD-10-CM | POA: Diagnosis not present

## 2017-10-10 DIAGNOSIS — J449 Chronic obstructive pulmonary disease, unspecified: Secondary | ICD-10-CM | POA: Diagnosis not present

## 2017-10-11 DIAGNOSIS — J449 Chronic obstructive pulmonary disease, unspecified: Secondary | ICD-10-CM | POA: Diagnosis not present

## 2017-10-11 DIAGNOSIS — L89321 Pressure ulcer of left buttock, stage 1: Secondary | ICD-10-CM | POA: Diagnosis not present

## 2017-10-11 DIAGNOSIS — G894 Chronic pain syndrome: Secondary | ICD-10-CM | POA: Diagnosis not present

## 2017-10-11 DIAGNOSIS — I110 Hypertensive heart disease with heart failure: Secondary | ICD-10-CM | POA: Diagnosis not present

## 2017-10-11 DIAGNOSIS — I5043 Acute on chronic combined systolic (congestive) and diastolic (congestive) heart failure: Secondary | ICD-10-CM | POA: Diagnosis not present

## 2017-10-11 DIAGNOSIS — E119 Type 2 diabetes mellitus without complications: Secondary | ICD-10-CM | POA: Diagnosis not present

## 2017-10-12 DIAGNOSIS — J449 Chronic obstructive pulmonary disease, unspecified: Secondary | ICD-10-CM | POA: Diagnosis not present

## 2017-10-12 DIAGNOSIS — L89321 Pressure ulcer of left buttock, stage 1: Secondary | ICD-10-CM | POA: Diagnosis not present

## 2017-10-12 DIAGNOSIS — I5043 Acute on chronic combined systolic (congestive) and diastolic (congestive) heart failure: Secondary | ICD-10-CM | POA: Diagnosis not present

## 2017-10-12 DIAGNOSIS — I110 Hypertensive heart disease with heart failure: Secondary | ICD-10-CM | POA: Diagnosis not present

## 2017-10-12 DIAGNOSIS — G894 Chronic pain syndrome: Secondary | ICD-10-CM | POA: Diagnosis not present

## 2017-10-12 DIAGNOSIS — E119 Type 2 diabetes mellitus without complications: Secondary | ICD-10-CM | POA: Diagnosis not present

## 2017-10-15 DIAGNOSIS — G894 Chronic pain syndrome: Secondary | ICD-10-CM | POA: Diagnosis not present

## 2017-10-15 DIAGNOSIS — I110 Hypertensive heart disease with heart failure: Secondary | ICD-10-CM | POA: Diagnosis not present

## 2017-10-15 DIAGNOSIS — L89321 Pressure ulcer of left buttock, stage 1: Secondary | ICD-10-CM | POA: Diagnosis not present

## 2017-10-15 DIAGNOSIS — E119 Type 2 diabetes mellitus without complications: Secondary | ICD-10-CM | POA: Diagnosis not present

## 2017-10-15 DIAGNOSIS — J449 Chronic obstructive pulmonary disease, unspecified: Secondary | ICD-10-CM | POA: Diagnosis not present

## 2017-10-15 DIAGNOSIS — I5043 Acute on chronic combined systolic (congestive) and diastolic (congestive) heart failure: Secondary | ICD-10-CM | POA: Diagnosis not present

## 2017-10-16 DIAGNOSIS — E119 Type 2 diabetes mellitus without complications: Secondary | ICD-10-CM | POA: Diagnosis not present

## 2017-10-16 DIAGNOSIS — J449 Chronic obstructive pulmonary disease, unspecified: Secondary | ICD-10-CM | POA: Diagnosis not present

## 2017-10-16 DIAGNOSIS — I5043 Acute on chronic combined systolic (congestive) and diastolic (congestive) heart failure: Secondary | ICD-10-CM | POA: Diagnosis not present

## 2017-10-16 DIAGNOSIS — G894 Chronic pain syndrome: Secondary | ICD-10-CM | POA: Diagnosis not present

## 2017-10-16 DIAGNOSIS — L89321 Pressure ulcer of left buttock, stage 1: Secondary | ICD-10-CM | POA: Diagnosis not present

## 2017-10-16 DIAGNOSIS — I110 Hypertensive heart disease with heart failure: Secondary | ICD-10-CM | POA: Diagnosis not present

## 2017-10-17 DIAGNOSIS — L89321 Pressure ulcer of left buttock, stage 1: Secondary | ICD-10-CM | POA: Diagnosis not present

## 2017-10-17 DIAGNOSIS — J449 Chronic obstructive pulmonary disease, unspecified: Secondary | ICD-10-CM | POA: Diagnosis not present

## 2017-10-17 DIAGNOSIS — E119 Type 2 diabetes mellitus without complications: Secondary | ICD-10-CM | POA: Diagnosis not present

## 2017-10-17 DIAGNOSIS — I5043 Acute on chronic combined systolic (congestive) and diastolic (congestive) heart failure: Secondary | ICD-10-CM | POA: Diagnosis not present

## 2017-10-17 DIAGNOSIS — G894 Chronic pain syndrome: Secondary | ICD-10-CM | POA: Diagnosis not present

## 2017-10-17 DIAGNOSIS — I110 Hypertensive heart disease with heart failure: Secondary | ICD-10-CM | POA: Diagnosis not present

## 2017-10-18 DIAGNOSIS — I5043 Acute on chronic combined systolic (congestive) and diastolic (congestive) heart failure: Secondary | ICD-10-CM | POA: Diagnosis not present

## 2017-10-18 DIAGNOSIS — G894 Chronic pain syndrome: Secondary | ICD-10-CM | POA: Diagnosis not present

## 2017-10-18 DIAGNOSIS — L89321 Pressure ulcer of left buttock, stage 1: Secondary | ICD-10-CM | POA: Diagnosis not present

## 2017-10-18 DIAGNOSIS — E119 Type 2 diabetes mellitus without complications: Secondary | ICD-10-CM | POA: Diagnosis not present

## 2017-10-18 DIAGNOSIS — J449 Chronic obstructive pulmonary disease, unspecified: Secondary | ICD-10-CM | POA: Diagnosis not present

## 2017-10-18 DIAGNOSIS — I110 Hypertensive heart disease with heart failure: Secondary | ICD-10-CM | POA: Diagnosis not present

## 2017-10-19 ENCOUNTER — Inpatient Hospital Stay: Payer: Medicare Other | Admitting: Pulmonary Disease

## 2017-10-22 DIAGNOSIS — I110 Hypertensive heart disease with heart failure: Secondary | ICD-10-CM | POA: Diagnosis not present

## 2017-10-22 DIAGNOSIS — G894 Chronic pain syndrome: Secondary | ICD-10-CM | POA: Diagnosis not present

## 2017-10-22 DIAGNOSIS — I5043 Acute on chronic combined systolic (congestive) and diastolic (congestive) heart failure: Secondary | ICD-10-CM | POA: Diagnosis not present

## 2017-10-22 DIAGNOSIS — E119 Type 2 diabetes mellitus without complications: Secondary | ICD-10-CM | POA: Diagnosis not present

## 2017-10-22 DIAGNOSIS — L89321 Pressure ulcer of left buttock, stage 1: Secondary | ICD-10-CM | POA: Diagnosis not present

## 2017-10-22 DIAGNOSIS — J449 Chronic obstructive pulmonary disease, unspecified: Secondary | ICD-10-CM | POA: Diagnosis not present

## 2017-10-23 ENCOUNTER — Encounter: Payer: Self-pay | Admitting: Adult Health

## 2017-10-23 ENCOUNTER — Ambulatory Visit (INDEPENDENT_AMBULATORY_CARE_PROVIDER_SITE_OTHER): Payer: Medicare Other | Admitting: Adult Health

## 2017-10-23 VITALS — BP 130/78 | HR 73 | Ht 65.0 in | Wt 289.4 lb

## 2017-10-23 DIAGNOSIS — I5032 Chronic diastolic (congestive) heart failure: Secondary | ICD-10-CM

## 2017-10-23 DIAGNOSIS — E119 Type 2 diabetes mellitus without complications: Secondary | ICD-10-CM | POA: Diagnosis not present

## 2017-10-23 DIAGNOSIS — R0902 Hypoxemia: Secondary | ICD-10-CM | POA: Diagnosis not present

## 2017-10-23 DIAGNOSIS — J9611 Chronic respiratory failure with hypoxia: Secondary | ICD-10-CM

## 2017-10-23 DIAGNOSIS — J449 Chronic obstructive pulmonary disease, unspecified: Secondary | ICD-10-CM

## 2017-10-23 DIAGNOSIS — B348 Other viral infections of unspecified site: Secondary | ICD-10-CM

## 2017-10-23 DIAGNOSIS — J441 Chronic obstructive pulmonary disease with (acute) exacerbation: Secondary | ICD-10-CM | POA: Diagnosis not present

## 2017-10-23 DIAGNOSIS — G894 Chronic pain syndrome: Secondary | ICD-10-CM | POA: Diagnosis not present

## 2017-10-23 DIAGNOSIS — I5043 Acute on chronic combined systolic (congestive) and diastolic (congestive) heart failure: Secondary | ICD-10-CM | POA: Diagnosis not present

## 2017-10-23 DIAGNOSIS — G4733 Obstructive sleep apnea (adult) (pediatric): Secondary | ICD-10-CM

## 2017-10-23 DIAGNOSIS — J4530 Mild persistent asthma, uncomplicated: Secondary | ICD-10-CM | POA: Diagnosis not present

## 2017-10-23 DIAGNOSIS — I5030 Unspecified diastolic (congestive) heart failure: Secondary | ICD-10-CM | POA: Insufficient documentation

## 2017-10-23 DIAGNOSIS — I110 Hypertensive heart disease with heart failure: Secondary | ICD-10-CM | POA: Diagnosis not present

## 2017-10-23 DIAGNOSIS — J45909 Unspecified asthma, uncomplicated: Secondary | ICD-10-CM | POA: Insufficient documentation

## 2017-10-23 DIAGNOSIS — L89321 Pressure ulcer of left buttock, stage 1: Secondary | ICD-10-CM | POA: Diagnosis not present

## 2017-10-23 NOTE — Assessment & Plan Note (Signed)
Appears compensated without evidence of volume overload on exam   Plan  Cont on current regimen .  follow up with Cardiology a planned .

## 2017-10-23 NOTE — Assessment & Plan Note (Signed)
Continue on O2 , goal is to keep O2 sats >88-90%.

## 2017-10-23 NOTE — Assessment & Plan Note (Signed)
Recent flare now resolving   Plan  . Patient Instructions  May use xopenex inhaler or neb As needed  Wheezing .  Continue on Oxygen 4l./m  Set up for split night sleep study . Please keep this appointment .  Work on weight loss.  Follow up Dr. Halford Chessman in 2 months and As needed   Please contact office for sooner follow up if symptoms do not improve or worsen or seek emergency care

## 2017-10-23 NOTE — Patient Instructions (Addendum)
May use xopenex inhaler or neb As needed  Wheezing .  Continue on Oxygen 4l./m  Set up for split night sleep study . Please keep this appointment .  Work on weight loss.  Follow up Dr. Halford Chessman in 2 months and As needed   Please contact office for sooner follow up if symptoms do not improve or worsen or seek emergency care

## 2017-10-23 NOTE — Progress Notes (Signed)
I have reviewed and agree with assessment/plan.  Chesley Mires, MD [facility] Pulmonary/Critical Care 10/23/2017, 6:19 PM Pager:  [phone]

## 2017-10-23 NOTE — Progress Notes (Signed)
@Patient  ID: [name], female    DOB: 02-03-1956, 61 y.o.   MRN: [id number]  Chief Complaint  Patient presents with  . Follow-up    OSA /RF     Referring provider: Dixie Dials, MD  HPI: 61 yo obese female minimal smoking history with obstructive sleep apnea and OHS Along with hypoxic and hypercarbic Respiratory failure on O2 at 4l/m   TESTS: Auto BPAP 11/13/11 to 11/26/11>>Used on 12 of 14 days with average 2 hrs 17 min. Average AHI 28 (8.6 obstructive, 19.6 central) with median setting 13/9 cm H2O, 95th percentile setting 15/11 cm H2O. PSG 03/26/12>>AHI 36.6, SpO2 low 91%. Test done with 2 liters oxygen. PLMI 3.1. PVC's, bigeminy. BiPAP 05/23/12 to 07/08/12>>Used on 47 of 47 nights with average 4 hrs 48 min. Average AHI 11 with BiPAP 13/9 cm H2O. ABG 07/07/13 >> pH 7.34, PaCO2 59.4, PaO2 82 Echo 11/09/14 >> EF 55 to 60%, mod MR  10/23/2017 Follow up ; OSA/OHS /O2 RF and Post hospital follow up  Pt returns for follow up from recent hospitalization for Acute on chronic hypercarbic resp failure with bronchitis /rhinovirus . She was treated with BIPAP support , IV abx and steroids . She had significant hypercarbia . PCO2 93.. She does has an old CPAP or BIPAP  machine at home that she has not worn in long time . She was last seen in office 2016 and early 2017 recommended to have split night sleep study so we can get new sleep machine but she did not keep follow up or sleep study appointment . We discussed importance of keeping  follow up appointments.  She has diastolic CHF and A Fib . Says edema has been better. Taking lasix on regular basis.  She remains on o2 at 4l/m . Says since discharge she is feeling better . sTarting to get stronger.  PT is working with her home .  Denies chest pain  Orthopnea or feve.r  She was told she has COPD but has mininmal smoking hx . Spirometry today show moderate restriction with no significant obstruction . +small airway obstruction .  She  has xopenex nebs at home , uses As needed  . Needs refill.         Allergies  Allergen Reactions  . Aspirin Other (See Comments)    Chest pain  . Eliquis [Apixaban]     Excessive bleeding  . Nsaids Other (See Comments)    Chest pain  . Other Other (See Comments)    "BP med" combined with plavix, caused syncope  . Plavix [Clopidogrel] Other (See Comments)    syncope  . Sudafed [Pseudoephedrine] Palpitations    Raised BP  . Erythromycin Nausea And Vomiting  . Sulfa Antibiotics Other (See Comments)    constipation  . Sulfamethoxazole-Trimethoprim Diarrhea  . Altace [Ramipril] Other (See Comments)    Severe fatigue  . Zofran [Ondansetron Hcl] Other (See Comments)    "I put me into a coma"    Immunization History  Administered Date(s) Administered  . Influenza Split 10/10/2011  . Influenza,inj,Quad PF,6+ Mos 11/09/2014, 10/29/2015  . PPD Test 08/30/2017  . Pneumococcal Polysaccharide-23 10/10/2011, 01/01/2016  . Tdap 07/02/2017    Past Medical History:  Diagnosis Date  . Acute and chronic respiratory failure with hypercapnia ([place])   . ADD (attention deficit disorder)   . Altered mental status    2nd to hypercapnea  . CHF (congestive heart failure) ([facility])   . Chronic low back pain   .  Complication of anesthesia    " I AM SLOW TO WAKE UP AT ITMES "  . Coronary artery disease   . Depression   . Diabetes mellitus   . Endometrial cancer ([place])   . GERD (gastroesophageal reflux disease)   . Hypercapnia   . Hypertension   . Hypothyroidism   . Hypoxemia   . Morbid obesity ([place])   . Nephrolithiasis   . OSA (obstructive sleep apnea)   . Paroxysmal atrial fibrillation (H[facility])   . Restless leg syndrome     Tobacco History: Social History   Tobacco Use  Smoking Status Former Smoker  . Packs/day: 0.50  . Years: 2.00  . Pack years: 1.00  . Types: Cigarettes  . Last attempt to quit: 09/10/1974  . Years since quitting: 43.1  Smokeless Tobacco Never Used  Tobacco  Comment   quit in teenage years   Counseling given: Not Answered Comment: quit in teenage years   Outpatient Encounter Medications as of 10/23/2017  Medication Sig  . buprenorphine (SUBUTEX) 8 MG SUBL SL tablet Place 1 tablet (8 mg total) under the tongue 2 (two) times daily.  . cetirizine (ZYRTEC) 10 MG tablet Take 10 mg by mouth at bedtime.  . clotrimazole-betamethasone (LOTRISONE) cream Apply 1 application topically daily as needed (antifungal).  Marland Kitchen diltiazem (DILACOR XR) 240 MG 24 hr capsule Take 1 capsule (240 mg total) by mouth daily.  . furosemide (LASIX) 40 MG tablet Take 1 tablet (40 mg total) by mouth 2 (two) times daily.  Marland Kitchen gabapentin (NEURONTIN) 100 MG capsule Take 1 capsule (100 mg total) by mouth 2 (two) times daily.  Marland Kitchen ketotifen (ZADITOR) 0.025 % ophthalmic solution Place 1 drop into both eyes 3 (three) times daily as needed.  . levalbuterol (XOPENEX) 1.25 MG/0.5ML nebulizer solution Take 1.25 mg by nebulization 3 (three) times daily as needed for wheezing or shortness of breath.  . levalbuterol (XOPENEX) 1.25 MG/0.5ML nebulizer solution Take 1.25 mg by nebulization daily as needed for wheezing or shortness of breath.  . metoprolol tartrate (LOPRESSOR) 50 MG tablet Take 1 tablet (50 mg total) by mouth 2 (two) times daily.  . potassium chloride (K-DUR) 10 MEQ tablet Take 1 tablet (10 mEq total) by mouth 2 (two) times daily.  . prochlorperazine (COMPAZINE) 10 MG tablet Take 1 tablet (10 mg total) by mouth every 6 (six) hours as needed for nausea or vomiting.  . ranitidine (ZANTAC) 150 MG tablet Take 150 mg 2 (two) times daily by mouth.  . sertraline (ZOLOFT) 100 MG tablet Take 1 tablet (100 mg total) by mouth daily.  Marland Kitchen SYNTHROID 75 MCG tablet Take 1 tablet (75 mcg total) by mouth daily before breakfast.  . [DISCONTINUED] famotidine (PEPCID) 20 MG tablet Take 20 mg by mouth daily.   No facility-administered encounter medications on file as of 10/23/2017.      Review of  Systems  Constitutional:   No  weight loss, night sweats,  Fevers, chills +, fatigue, or  lassitude.  HEENT:   No headaches,  Difficulty swallowing,  Tooth/dental problems, or  Sore throat,                No sneezing, itching, ear ache, nasal congestion, post nasal drip,   CV:  No chest pain,  Orthopnea, PND, swelling in lower extremities, anasarca, dizziness, palpitations, syncope.   GI  No heartburn, indigestion, abdominal pain, nausea, vomiting, diarrhea, change in bowel habits, loss of appetite, bloody stools.   Resp:   No chest wall  deformity  Skin: no rash or lesions.  GU: no dysuria, change in color of urine, no urgency or frequency.  No flank pain, no hematuria   MS:  No joint pain or swelling.  No decreased range of motion.  No back pain.    Physical Exam  BP 130/78 (BP Location: Right Wrist, Cuff Size: Normal)   Pulse 73   Ht 5\' 5"  (1.651 m)   Wt 289 lb 6.4 oz (131.3 kg)   SpO2 97%   BMI 48.16 kg/m   GEN: A/Ox3; pleasant , NAD, morbidly obese on O2 , in wc    HEENT:  /AT,  EACs-clear, TMs-wnl, NOSE-clear, THROAT-clear, no lesions, no postnasal drip or exudate noted. Class 2-3 MP airway   NECK:  Supple w/ fair ROM; no JVD; normal carotid impulses w/o bruits; no thyromegaly or nodules palpated; no lymphadenopathy.    RESP  Clear  P & A; w/o, wheezes/ rales/ or rhonchi. no accessory muscle use, no dullness to percussion  CARD:  RRR, no m/r/g, 1 +  peripheral edema, pulses intact, no cyanosis or clubbing.  GI:   Soft & nt; nml bowel sounds; no organomegaly or masses detected.   Musco: Warm bil, no deformities or joint swelling noted.   Neuro: alert, no focal deficits noted.    Skin: Warm, no lesions or rashes    Lab Results:    Imaging: No results found.   Assessment & Plan:   Rhinovirus infection Recent flare now resolving   Plan  . Patient Instructions  May use xopenex inhaler or neb As needed  Wheezing .  Continue on Oxygen 4l./m  Set  up for split night sleep study . Please keep this appointment .  Work on weight loss.  Follow up Dr. Halford Chessman in 2 months and As needed   Please contact office for sooner follow up if symptoms do not improve or worsen or seek emergency care      OSA (obstructive sleep apnea) Severe OSA with associated OHS /Hypercarbia  With CPAP noncompliance in past . She does not have a working CPAP /BIPAP . Will need a new sleep study in order to get restarted  Will need to set up Split night study with option for BIPAP titration if not able to control events .   Plan  Patient Instructions  May use xopenex inhaler or neb As needed  Wheezing .  Continue on Oxygen 4l./m  Set up for split night sleep study . Please keep this appointment .  Work on weight loss.  Follow up Dr. Halford Chessman in 2 months and As needed   Please contact office for sooner follow up if symptoms do not improve or worsen or seek emergency care      Diastolic CHF ([place]) Appears compensated without evidence of volume overload on exam   Plan  Cont on current regimen .  follow up with Cardiology a planned .   Hypoxia Continue on O2 , goal is to keep O2 sats >88-90%.   Asthma She has minimal smoking hx . Spirometry shows more restriction -suspect related to obesity .  Does has some small airway obstruction - possible asthma  Would use Xopenex As needed  . If more flare, can consider ICS inhaler on return  Check cxr today .   Plan  Patient Instructions  May use xopenex inhaler or neb As needed  Wheezing .  Continue on Oxygen 4l./m  Set up for split night sleep study . Please keep this  appointment .  Work on weight loss.  Follow up Dr. Halford Chessman in 2 months and As needed   Please contact office for sooner follow up if symptoms do not improve or worsen or seek emergency care         Rexene Edison, NP 10/23/2017

## 2017-10-23 NOTE — Assessment & Plan Note (Signed)
She has minimal smoking hx . Spirometry shows more restriction -suspect related to obesity .  Does has some small airway obstruction - possible asthma  Would use Xopenex As needed  . If more flare, can consider ICS inhaler on return  Check cxr today .   Plan  Patient Instructions  May use xopenex inhaler or neb As needed  Wheezing .  Continue on Oxygen 4l./m  Set up for split night sleep study . Please keep this appointment .  Work on weight loss.  Follow up Dr. Halford Chessman in 2 months and As needed   Please contact office for sooner follow up if symptoms do not improve or worsen or seek emergency care

## 2017-10-23 NOTE — Assessment & Plan Note (Signed)
Severe OSA with associated OHS /Hypercarbia  With CPAP noncompliance in past . She does not have a working CPAP /BIPAP . Will need a new sleep study in order to get restarted  Will need to set up Split night study with option for BIPAP titration if not able to control events .   Plan  Patient Instructions  May use xopenex inhaler or neb As needed  Wheezing .  Continue on Oxygen 4l./m  Set up for split night sleep study . Please keep this appointment .  Work on weight loss.  Follow up Dr. Halford Chessman in 2 months and As needed   Please contact office for sooner follow up if symptoms do not improve or worsen or seek emergency care

## 2017-10-25 DIAGNOSIS — L89321 Pressure ulcer of left buttock, stage 1: Secondary | ICD-10-CM | POA: Diagnosis not present

## 2017-10-25 DIAGNOSIS — I110 Hypertensive heart disease with heart failure: Secondary | ICD-10-CM | POA: Diagnosis not present

## 2017-10-25 DIAGNOSIS — G894 Chronic pain syndrome: Secondary | ICD-10-CM | POA: Diagnosis not present

## 2017-10-25 DIAGNOSIS — E119 Type 2 diabetes mellitus without complications: Secondary | ICD-10-CM | POA: Diagnosis not present

## 2017-10-25 DIAGNOSIS — I5043 Acute on chronic combined systolic (congestive) and diastolic (congestive) heart failure: Secondary | ICD-10-CM | POA: Diagnosis not present

## 2017-10-25 DIAGNOSIS — J449 Chronic obstructive pulmonary disease, unspecified: Secondary | ICD-10-CM | POA: Diagnosis not present

## 2017-10-25 MED ORDER — LEVALBUTEROL HCL 1.25 MG/0.5ML IN NEBU: 1.2500 mg | INHALATION_SOLUTION | Freq: Three times a day (TID) | RESPIRATORY_TRACT | 5 refills | Status: DC | PRN

## 2017-10-25 NOTE — Addendum Note (Signed)
Addended by: Parke Poisson E on: 10/25/2017 03:07 PM   Modules accepted: Orders

## 2017-10-26 ENCOUNTER — Telehealth: Payer: Self-pay | Admitting: Adult Health

## 2017-10-26 DIAGNOSIS — L89321 Pressure ulcer of left buttock, stage 1: Secondary | ICD-10-CM | POA: Diagnosis not present

## 2017-10-26 DIAGNOSIS — J9611 Chronic respiratory failure with hypoxia: Secondary | ICD-10-CM

## 2017-10-26 DIAGNOSIS — G894 Chronic pain syndrome: Secondary | ICD-10-CM | POA: Diagnosis not present

## 2017-10-26 DIAGNOSIS — I110 Hypertensive heart disease with heart failure: Secondary | ICD-10-CM | POA: Diagnosis not present

## 2017-10-26 DIAGNOSIS — J449 Chronic obstructive pulmonary disease, unspecified: Secondary | ICD-10-CM | POA: Diagnosis not present

## 2017-10-26 DIAGNOSIS — I5043 Acute on chronic combined systolic (congestive) and diastolic (congestive) heart failure: Secondary | ICD-10-CM | POA: Diagnosis not present

## 2017-10-26 DIAGNOSIS — E119 Type 2 diabetes mellitus without complications: Secondary | ICD-10-CM | POA: Diagnosis not present

## 2017-10-26 MED ORDER — LEVALBUTEROL HCL 1.25 MG/0.5ML IN NEBU: 1.2500 mg | INHALATION_SOLUTION | Freq: Three times a day (TID) | RESPIRATORY_TRACT | 5 refills | Status: DC | PRN

## 2017-10-26 NOTE — Telephone Encounter (Signed)
[facility], no one from the pharmacy department answered.   Called patient and explained to her the situation. Patient [facility] [address] patient that I will [facility]   Nothing else needed at time of call.

## 2017-10-28 DIAGNOSIS — E119 Type 2 diabetes mellitus without complications: Secondary | ICD-10-CM | POA: Diagnosis not present

## 2017-10-28 DIAGNOSIS — G894 Chronic pain syndrome: Secondary | ICD-10-CM | POA: Diagnosis not present

## 2017-10-28 DIAGNOSIS — J449 Chronic obstructive pulmonary disease, unspecified: Secondary | ICD-10-CM | POA: Diagnosis not present

## 2017-10-28 DIAGNOSIS — L89321 Pressure ulcer of left buttock, stage 1: Secondary | ICD-10-CM | POA: Diagnosis not present

## 2017-10-28 DIAGNOSIS — I5043 Acute on chronic combined systolic (congestive) and diastolic (congestive) heart failure: Secondary | ICD-10-CM | POA: Diagnosis not present

## 2017-10-28 DIAGNOSIS — I110 Hypertensive heart disease with heart failure: Secondary | ICD-10-CM | POA: Diagnosis not present

## 2017-10-29 ENCOUNTER — Telehealth: Payer: Self-pay | Admitting: Adult Health

## 2017-10-29 DIAGNOSIS — I110 Hypertensive heart disease with heart failure: Secondary | ICD-10-CM | POA: Diagnosis not present

## 2017-10-29 DIAGNOSIS — J208 Acute bronchitis due to other specified organisms: Secondary | ICD-10-CM | POA: Diagnosis not present

## 2017-10-29 DIAGNOSIS — E119 Type 2 diabetes mellitus without complications: Secondary | ICD-10-CM | POA: Diagnosis not present

## 2017-10-29 DIAGNOSIS — I5043 Acute on chronic combined systolic (congestive) and diastolic (congestive) heart failure: Secondary | ICD-10-CM | POA: Diagnosis not present

## 2017-10-29 DIAGNOSIS — I2510 Atherosclerotic heart disease of native coronary artery without angina pectoris: Secondary | ICD-10-CM | POA: Diagnosis not present

## 2017-10-29 DIAGNOSIS — L89321 Pressure ulcer of left buttock, stage 1: Secondary | ICD-10-CM | POA: Diagnosis not present

## 2017-10-29 DIAGNOSIS — I482 Chronic atrial fibrillation: Secondary | ICD-10-CM | POA: Diagnosis not present

## 2017-10-29 DIAGNOSIS — G894 Chronic pain syndrome: Secondary | ICD-10-CM | POA: Diagnosis not present

## 2017-10-29 DIAGNOSIS — J449 Chronic obstructive pulmonary disease, unspecified: Secondary | ICD-10-CM | POA: Diagnosis not present

## 2017-10-29 DIAGNOSIS — I5032 Chronic diastolic (congestive) heart failure: Secondary | ICD-10-CM | POA: Diagnosis not present

## 2017-10-29 NOTE — Telephone Encounter (Signed)
[facility] to check status of [facility]openex neb solution as prescribed today- pharmacist states that rx is in fact approved and is $24/30 day supply.  Pharmacist did note that is must be ran through Medicare part B.    Spoke with pt to make aware.  Nothing further needed.

## 2017-10-30 DIAGNOSIS — L89321 Pressure ulcer of left buttock, stage 1: Secondary | ICD-10-CM | POA: Diagnosis not present

## 2017-10-30 DIAGNOSIS — E119 Type 2 diabetes mellitus without complications: Secondary | ICD-10-CM | POA: Diagnosis not present

## 2017-10-30 DIAGNOSIS — I110 Hypertensive heart disease with heart failure: Secondary | ICD-10-CM | POA: Diagnosis not present

## 2017-10-30 DIAGNOSIS — I5043 Acute on chronic combined systolic (congestive) and diastolic (congestive) heart failure: Secondary | ICD-10-CM | POA: Diagnosis not present

## 2017-10-30 DIAGNOSIS — G894 Chronic pain syndrome: Secondary | ICD-10-CM | POA: Diagnosis not present

## 2017-10-30 DIAGNOSIS — J449 Chronic obstructive pulmonary disease, unspecified: Secondary | ICD-10-CM | POA: Diagnosis not present

## 2017-10-31 ENCOUNTER — Other Ambulatory Visit: Payer: Self-pay

## 2017-11-06 DIAGNOSIS — I5043 Acute on chronic combined systolic (congestive) and diastolic (congestive) heart failure: Secondary | ICD-10-CM | POA: Diagnosis not present

## 2017-11-06 DIAGNOSIS — G894 Chronic pain syndrome: Secondary | ICD-10-CM | POA: Diagnosis not present

## 2017-11-06 DIAGNOSIS — I110 Hypertensive heart disease with heart failure: Secondary | ICD-10-CM | POA: Diagnosis not present

## 2017-11-06 DIAGNOSIS — J449 Chronic obstructive pulmonary disease, unspecified: Secondary | ICD-10-CM | POA: Diagnosis not present

## 2017-11-06 DIAGNOSIS — E119 Type 2 diabetes mellitus without complications: Secondary | ICD-10-CM | POA: Diagnosis not present

## 2017-11-06 DIAGNOSIS — L89321 Pressure ulcer of left buttock, stage 1: Secondary | ICD-10-CM | POA: Diagnosis not present

## 2017-11-07 DIAGNOSIS — G894 Chronic pain syndrome: Secondary | ICD-10-CM | POA: Diagnosis not present

## 2017-11-07 DIAGNOSIS — E119 Type 2 diabetes mellitus without complications: Secondary | ICD-10-CM | POA: Diagnosis not present

## 2017-11-07 DIAGNOSIS — J449 Chronic obstructive pulmonary disease, unspecified: Secondary | ICD-10-CM | POA: Diagnosis not present

## 2017-11-07 DIAGNOSIS — I110 Hypertensive heart disease with heart failure: Secondary | ICD-10-CM | POA: Diagnosis not present

## 2017-11-07 DIAGNOSIS — L89321 Pressure ulcer of left buttock, stage 1: Secondary | ICD-10-CM | POA: Diagnosis not present

## 2017-11-07 DIAGNOSIS — I5043 Acute on chronic combined systolic (congestive) and diastolic (congestive) heart failure: Secondary | ICD-10-CM | POA: Diagnosis not present

## 2017-11-08 DIAGNOSIS — I5043 Acute on chronic combined systolic (congestive) and diastolic (congestive) heart failure: Secondary | ICD-10-CM | POA: Diagnosis not present

## 2017-11-08 DIAGNOSIS — E119 Type 2 diabetes mellitus without complications: Secondary | ICD-10-CM | POA: Diagnosis not present

## 2017-11-08 DIAGNOSIS — I110 Hypertensive heart disease with heart failure: Secondary | ICD-10-CM | POA: Diagnosis not present

## 2017-11-08 DIAGNOSIS — G894 Chronic pain syndrome: Secondary | ICD-10-CM | POA: Diagnosis not present

## 2017-11-08 DIAGNOSIS — J449 Chronic obstructive pulmonary disease, unspecified: Secondary | ICD-10-CM | POA: Diagnosis not present

## 2017-11-08 DIAGNOSIS — L89321 Pressure ulcer of left buttock, stage 1: Secondary | ICD-10-CM | POA: Diagnosis not present

## 2017-11-08 NOTE — Patient Outreach (Signed)
[place] [facility]) Care Management  Late entry for 10/31/2017  [name] March 27, 1956 [id number]  Third and final outreach attempt completed without success. Left HIPAA compliant voicemail with RNCM contact information and requested callback.   RNCM sent outreach barrier letter and will wait 10 business days before completing case closure if no return call has been received.  Anna R. Venancio Chenier, RN, BSN, [place] Management Coordinator [phone]

## 2017-11-09 DIAGNOSIS — I5043 Acute on chronic combined systolic (congestive) and diastolic (congestive) heart failure: Secondary | ICD-10-CM | POA: Diagnosis not present

## 2017-11-09 DIAGNOSIS — H40023 Open angle with borderline findings, high risk, bilateral: Secondary | ICD-10-CM | POA: Diagnosis not present

## 2017-11-09 DIAGNOSIS — L89321 Pressure ulcer of left buttock, stage 1: Secondary | ICD-10-CM | POA: Diagnosis not present

## 2017-11-09 DIAGNOSIS — J449 Chronic obstructive pulmonary disease, unspecified: Secondary | ICD-10-CM | POA: Diagnosis not present

## 2017-11-09 DIAGNOSIS — E119 Type 2 diabetes mellitus without complications: Secondary | ICD-10-CM | POA: Diagnosis not present

## 2017-11-09 DIAGNOSIS — G894 Chronic pain syndrome: Secondary | ICD-10-CM | POA: Diagnosis not present

## 2017-11-09 DIAGNOSIS — I110 Hypertensive heart disease with heart failure: Secondary | ICD-10-CM | POA: Diagnosis not present

## 2017-11-10 ENCOUNTER — Other Ambulatory Visit: Payer: Self-pay | Admitting: Adult Health

## 2017-11-10 DIAGNOSIS — I10 Essential (primary) hypertension: Secondary | ICD-10-CM

## 2017-11-10 DIAGNOSIS — I482 Chronic atrial fibrillation, unspecified: Secondary | ICD-10-CM

## 2017-11-10 DIAGNOSIS — I5042 Chronic combined systolic (congestive) and diastolic (congestive) heart failure: Secondary | ICD-10-CM

## 2017-11-12 ENCOUNTER — Other Ambulatory Visit: Payer: Self-pay | Admitting: Adult Health

## 2017-11-12 DIAGNOSIS — L89321 Pressure ulcer of left buttock, stage 1: Secondary | ICD-10-CM | POA: Diagnosis not present

## 2017-11-12 DIAGNOSIS — I482 Chronic atrial fibrillation, unspecified: Secondary | ICD-10-CM

## 2017-11-12 DIAGNOSIS — I5042 Chronic combined systolic (congestive) and diastolic (congestive) heart failure: Secondary | ICD-10-CM

## 2017-11-12 DIAGNOSIS — F419 Anxiety disorder, unspecified: Secondary | ICD-10-CM

## 2017-11-12 DIAGNOSIS — F329 Major depressive disorder, single episode, unspecified: Secondary | ICD-10-CM

## 2017-11-12 DIAGNOSIS — J449 Chronic obstructive pulmonary disease, unspecified: Secondary | ICD-10-CM | POA: Diagnosis not present

## 2017-11-12 DIAGNOSIS — E119 Type 2 diabetes mellitus without complications: Secondary | ICD-10-CM | POA: Diagnosis not present

## 2017-11-12 DIAGNOSIS — I110 Hypertensive heart disease with heart failure: Secondary | ICD-10-CM | POA: Diagnosis not present

## 2017-11-12 DIAGNOSIS — E876 Hypokalemia: Secondary | ICD-10-CM

## 2017-11-12 DIAGNOSIS — G894 Chronic pain syndrome: Secondary | ICD-10-CM | POA: Diagnosis not present

## 2017-11-12 DIAGNOSIS — I10 Essential (primary) hypertension: Secondary | ICD-10-CM

## 2017-11-12 DIAGNOSIS — I5043 Acute on chronic combined systolic (congestive) and diastolic (congestive) heart failure: Secondary | ICD-10-CM | POA: Diagnosis not present

## 2017-11-13 ENCOUNTER — Other Ambulatory Visit: Payer: Self-pay | Admitting: Adult Health

## 2017-11-13 DIAGNOSIS — I5042 Chronic combined systolic (congestive) and diastolic (congestive) heart failure: Secondary | ICD-10-CM

## 2017-11-13 DIAGNOSIS — I482 Chronic atrial fibrillation, unspecified: Secondary | ICD-10-CM

## 2017-11-13 DIAGNOSIS — I10 Essential (primary) hypertension: Secondary | ICD-10-CM

## 2017-11-14 DIAGNOSIS — I5043 Acute on chronic combined systolic (congestive) and diastolic (congestive) heart failure: Secondary | ICD-10-CM | POA: Diagnosis not present

## 2017-11-14 DIAGNOSIS — L89321 Pressure ulcer of left buttock, stage 1: Secondary | ICD-10-CM | POA: Diagnosis not present

## 2017-11-14 DIAGNOSIS — J449 Chronic obstructive pulmonary disease, unspecified: Secondary | ICD-10-CM | POA: Diagnosis not present

## 2017-11-14 DIAGNOSIS — I110 Hypertensive heart disease with heart failure: Secondary | ICD-10-CM | POA: Diagnosis not present

## 2017-11-14 DIAGNOSIS — G894 Chronic pain syndrome: Secondary | ICD-10-CM | POA: Diagnosis not present

## 2017-11-14 DIAGNOSIS — E119 Type 2 diabetes mellitus without complications: Secondary | ICD-10-CM | POA: Diagnosis not present

## 2017-11-16 ENCOUNTER — Other Ambulatory Visit: Payer: Self-pay

## 2017-11-16 DIAGNOSIS — N200 Calculus of kidney: Secondary | ICD-10-CM | POA: Diagnosis not present

## 2017-11-16 DIAGNOSIS — N202 Calculus of kidney with calculus of ureter: Secondary | ICD-10-CM | POA: Diagnosis not present

## 2017-11-16 NOTE — Patient Outreach (Signed)
[facility]) Care Management  11/16/17  SASKIA SIMERSON 1956/09/15 [id number]  No successful communication with patient since sending outreach barrier letter on 10/31/2017. Will perform case closure.  [name] R. Anoop Hemmer, RN, BSN, [place] Management Coordinator [phone]

## 2017-11-20 DIAGNOSIS — G894 Chronic pain syndrome: Secondary | ICD-10-CM | POA: Diagnosis not present

## 2017-11-20 DIAGNOSIS — I110 Hypertensive heart disease with heart failure: Secondary | ICD-10-CM | POA: Diagnosis not present

## 2017-11-20 DIAGNOSIS — J449 Chronic obstructive pulmonary disease, unspecified: Secondary | ICD-10-CM | POA: Diagnosis not present

## 2017-11-20 DIAGNOSIS — I5043 Acute on chronic combined systolic (congestive) and diastolic (congestive) heart failure: Secondary | ICD-10-CM | POA: Diagnosis not present

## 2017-11-20 DIAGNOSIS — L89321 Pressure ulcer of left buttock, stage 1: Secondary | ICD-10-CM | POA: Diagnosis not present

## 2017-11-20 DIAGNOSIS — E119 Type 2 diabetes mellitus without complications: Secondary | ICD-10-CM | POA: Diagnosis not present

## 2017-11-22 DIAGNOSIS — E119 Type 2 diabetes mellitus without complications: Secondary | ICD-10-CM | POA: Diagnosis not present

## 2017-11-22 DIAGNOSIS — L89321 Pressure ulcer of left buttock, stage 1: Secondary | ICD-10-CM | POA: Diagnosis not present

## 2017-11-22 DIAGNOSIS — I110 Hypertensive heart disease with heart failure: Secondary | ICD-10-CM | POA: Diagnosis not present

## 2017-11-22 DIAGNOSIS — G894 Chronic pain syndrome: Secondary | ICD-10-CM | POA: Diagnosis not present

## 2017-11-22 DIAGNOSIS — J449 Chronic obstructive pulmonary disease, unspecified: Secondary | ICD-10-CM | POA: Diagnosis not present

## 2017-11-22 DIAGNOSIS — I5043 Acute on chronic combined systolic (congestive) and diastolic (congestive) heart failure: Secondary | ICD-10-CM | POA: Diagnosis not present

## 2017-11-30 ENCOUNTER — Encounter (HOSPITAL_BASED_OUTPATIENT_CLINIC_OR_DEPARTMENT_OTHER): Payer: Medicare Other

## 2017-12-17 ENCOUNTER — Other Ambulatory Visit: Payer: Self-pay | Admitting: Urology

## 2017-12-17 DIAGNOSIS — N200 Calculus of kidney: Secondary | ICD-10-CM

## 2017-12-17 DIAGNOSIS — N261 Atrophy of kidney (terminal): Secondary | ICD-10-CM

## 2017-12-24 ENCOUNTER — Ambulatory Visit: Payer: Medicare Other | Admitting: Pulmonary Disease

## 2017-12-26 ENCOUNTER — Encounter (HOSPITAL_COMMUNITY): Payer: Self-pay

## 2017-12-26 ENCOUNTER — Encounter (HOSPITAL_COMMUNITY): Payer: Medicare Other

## 2017-12-26 ENCOUNTER — Ambulatory Visit (HOSPITAL_COMMUNITY)
Admission: RE | Admit: 2017-12-26 | Discharge: 2017-12-26 | Disposition: A | Discharge status: Home / Self Care | Payer: Medicare Other | Source: Ambulatory Visit | Attending: Urology | Admitting: Urology

## 2018-01-11 ENCOUNTER — Ambulatory Visit (HOSPITAL_COMMUNITY): Admission: RE | Admit: 2018-01-11 | Payer: Medicare Other | Source: Ambulatory Visit

## 2018-01-18 ENCOUNTER — Encounter (HOSPITAL_BASED_OUTPATIENT_CLINIC_OR_DEPARTMENT_OTHER): Payer: Medicare Other

## 2018-01-31 ENCOUNTER — Inpatient Hospital Stay (HOSPITAL_COMMUNITY)
Admission: EM | Admit: 2018-01-31 | Discharge: 2018-02-03 | DRG: 689 | Disposition: A | Discharge status: Home / Self Care | Payer: Medicare Other | Attending: Family Medicine | Admitting: Family Medicine

## 2018-01-31 ENCOUNTER — Emergency Department (HOSPITAL_COMMUNITY): Payer: Medicare Other

## 2018-01-31 ENCOUNTER — Encounter (HOSPITAL_COMMUNITY): Payer: Self-pay | Admitting: Emergency Medicine

## 2018-01-31 ENCOUNTER — Other Ambulatory Visit: Payer: Self-pay

## 2018-01-31 DIAGNOSIS — I2510 Atherosclerotic heart disease of native coronary artery without angina pectoris: Secondary | ICD-10-CM | POA: Diagnosis present

## 2018-01-31 DIAGNOSIS — I4891 Unspecified atrial fibrillation: Secondary | ICD-10-CM | POA: Diagnosis present

## 2018-01-31 DIAGNOSIS — J9622 Acute and chronic respiratory failure with hypercapnia: Secondary | ICD-10-CM | POA: Diagnosis present

## 2018-01-31 DIAGNOSIS — Z8542 Personal history of malignant neoplasm of other parts of uterus: Secondary | ICD-10-CM

## 2018-01-31 DIAGNOSIS — G8929 Other chronic pain: Secondary | ICD-10-CM | POA: Diagnosis present

## 2018-01-31 DIAGNOSIS — I482 Chronic atrial fibrillation, unspecified: Secondary | ICD-10-CM

## 2018-01-31 DIAGNOSIS — Z90710 Acquired absence of both cervix and uterus: Secondary | ICD-10-CM

## 2018-01-31 DIAGNOSIS — I5042 Chronic combined systolic (congestive) and diastolic (congestive) heart failure: Secondary | ICD-10-CM | POA: Diagnosis not present

## 2018-01-31 DIAGNOSIS — E1142 Type 2 diabetes mellitus with diabetic polyneuropathy: Secondary | ICD-10-CM

## 2018-01-31 DIAGNOSIS — M549 Dorsalgia, unspecified: Secondary | ICD-10-CM | POA: Diagnosis present

## 2018-01-31 DIAGNOSIS — Z7984 Long term (current) use of oral hypoglycemic drugs: Secondary | ICD-10-CM

## 2018-01-31 DIAGNOSIS — N12 Tubulo-interstitial nephritis, not specified as acute or chronic: Secondary | ICD-10-CM | POA: Diagnosis not present

## 2018-01-31 DIAGNOSIS — Z87442 Personal history of urinary calculi: Secondary | ICD-10-CM

## 2018-01-31 DIAGNOSIS — Z6841 Body Mass Index (BMI) 40.0 and over, adult: Secondary | ICD-10-CM

## 2018-01-31 DIAGNOSIS — I480 Paroxysmal atrial fibrillation: Secondary | ICD-10-CM | POA: Diagnosis present

## 2018-01-31 DIAGNOSIS — Z79890 Hormone replacement therapy (postmenopausal): Secondary | ICD-10-CM

## 2018-01-31 DIAGNOSIS — R109 Unspecified abdominal pain: Secondary | ICD-10-CM | POA: Diagnosis not present

## 2018-01-31 DIAGNOSIS — I110 Hypertensive heart disease with heart failure: Secondary | ICD-10-CM | POA: Diagnosis present

## 2018-01-31 DIAGNOSIS — J449 Chronic obstructive pulmonary disease, unspecified: Secondary | ICD-10-CM | POA: Diagnosis present

## 2018-01-31 DIAGNOSIS — N10 Acute tubulo-interstitial nephritis: Principal | ICD-10-CM | POA: Diagnosis present

## 2018-01-31 DIAGNOSIS — Z888 Allergy status to other drugs, medicaments and biological substances status: Secondary | ICD-10-CM

## 2018-01-31 DIAGNOSIS — Z886 Allergy status to analgesic agent status: Secondary | ICD-10-CM

## 2018-01-31 DIAGNOSIS — F988 Other specified behavioral and emotional disorders with onset usually occurring in childhood and adolescence: Secondary | ICD-10-CM | POA: Diagnosis present

## 2018-01-31 DIAGNOSIS — Z809 Family history of malignant neoplasm, unspecified: Secondary | ICD-10-CM

## 2018-01-31 DIAGNOSIS — K219 Gastro-esophageal reflux disease without esophagitis: Secondary | ICD-10-CM | POA: Diagnosis present

## 2018-01-31 DIAGNOSIS — I5030 Unspecified diastolic (congestive) heart failure: Secondary | ICD-10-CM | POA: Diagnosis present

## 2018-01-31 DIAGNOSIS — E119 Type 2 diabetes mellitus without complications: Secondary | ICD-10-CM

## 2018-01-31 DIAGNOSIS — E039 Hypothyroidism, unspecified: Secondary | ICD-10-CM | POA: Diagnosis present

## 2018-01-31 DIAGNOSIS — I10 Essential (primary) hypertension: Secondary | ICD-10-CM | POA: Diagnosis not present

## 2018-01-31 DIAGNOSIS — Z87891 Personal history of nicotine dependence: Secondary | ICD-10-CM

## 2018-01-31 DIAGNOSIS — G2581 Restless legs syndrome: Secondary | ICD-10-CM | POA: Diagnosis present

## 2018-01-31 DIAGNOSIS — Z23 Encounter for immunization: Secondary | ICD-10-CM

## 2018-01-31 DIAGNOSIS — Z881 Allergy status to other antibiotic agents status: Secondary | ICD-10-CM

## 2018-01-31 DIAGNOSIS — E662 Morbid (severe) obesity with alveolar hypoventilation: Secondary | ICD-10-CM | POA: Diagnosis present

## 2018-01-31 DIAGNOSIS — Z882 Allergy status to sulfonamides status: Secondary | ICD-10-CM

## 2018-01-31 LAB — COMPREHENSIVE METABOLIC PANEL
ALK PHOS: 89 U/L (ref 38–126)
ALT: 11 U/L — AB (ref 14–54)
ANION GAP: 10 (ref 5–15)
AST: 13 U/L — ABNORMAL LOW (ref 15–41)
Albumin: 2.9 g/dL — ABNORMAL LOW (ref 3.5–5.0)
BUN: 17 mg/dL (ref 6–20)
CALCIUM: 8.2 mg/dL — AB (ref 8.9–10.3)
CHLORIDE: 98 mmol/L — AB (ref 101–111)
CO2: 28 mmol/L (ref 22–32)
CREATININE: 0.95 mg/dL (ref 0.44–1.00)
Glucose, Bld: 145 mg/dL — ABNORMAL HIGH (ref 65–99)
Potassium: 3.8 mmol/L (ref 3.5–5.1)
Sodium: 136 mmol/L (ref 135–145)
Total Bilirubin: 0.8 mg/dL (ref 0.3–1.2)
Total Protein: 7 g/dL (ref 6.5–8.1)

## 2018-01-31 LAB — URINALYSIS, ROUTINE W REFLEX MICROSCOPIC
Bilirubin Urine: NEGATIVE
Glucose, UA: NEGATIVE mg/dL
Ketones, ur: NEGATIVE mg/dL
Nitrite: POSITIVE — AB
Protein, ur: 100 mg/dL — AB
SPECIFIC GRAVITY, URINE: 1.014 (ref 1.005–1.030)
pH: 5 (ref 5.0–8.0)

## 2018-01-31 LAB — CBC WITH DIFFERENTIAL/PLATELET
Basophils Absolute: 0 10*3/uL (ref 0.0–0.1)
Basophils Relative: 0 %
EOS PCT: 0 %
Eosinophils Absolute: 0 10*3/uL (ref 0.0–0.7)
HCT: 34.6 % — ABNORMAL LOW (ref 36.0–46.0)
Hemoglobin: 10.5 g/dL — ABNORMAL LOW (ref 12.0–15.0)
LYMPHS ABS: 0.6 10*3/uL — AB (ref 0.7–4.0)
Lymphocytes Relative: 8 %
MCH: 25.8 pg — AB (ref 26.0–34.0)
MCHC: 30.3 g/dL (ref 30.0–36.0)
MCV: 85 fL (ref 78.0–100.0)
MONOS PCT: 7 %
Monocytes Absolute: 0.5 10*3/uL (ref 0.1–1.0)
Neutro Abs: 6.2 10*3/uL (ref 1.7–7.7)
Neutrophils Relative %: 85 %
PLATELETS: 149 10*3/uL — AB (ref 150–400)
RBC: 4.07 MIL/uL (ref 3.87–5.11)
RDW: 16.1 % — ABNORMAL HIGH (ref 11.5–15.5)
WBC: 7.3 10*3/uL (ref 4.0–10.5)

## 2018-01-31 LAB — I-STAT TROPONIN, ED: TROPONIN I, POC: 0.06 ng/mL (ref 0.00–0.08)

## 2018-01-31 LAB — LIPASE, BLOOD: LIPASE: 24 U/L (ref 11–51)

## 2018-01-31 LAB — MRSA PCR SCREENING: MRSA BY PCR: NEGATIVE

## 2018-01-31 LAB — BRAIN NATRIURETIC PEPTIDE: B Natriuretic Peptide: 216.9 pg/mL — ABNORMAL HIGH (ref 0.0–100.0)

## 2018-01-31 LAB — MAGNESIUM: MAGNESIUM: 1.5 mg/dL — AB (ref 1.7–2.4)

## 2018-01-31 LAB — I-STAT CG4 LACTIC ACID, ED: LACTIC ACID, VENOUS: 1.1 mmol/L (ref 0.5–1.9)

## 2018-01-31 MED ORDER — SODIUM CHLORIDE 0.9 % IV SOLN
1.0000 g | INTRAVENOUS | Status: DC
  Administered 2018-01-31 – 2018-02-02 (×3): 1 g via INTRAVENOUS
  Filled 2018-01-31 (×2): qty 1
  Filled 2018-01-31: qty 10
  Filled 2018-01-31: qty 1

## 2018-01-31 MED ORDER — ONDANSETRON HCL 4 MG/2ML IJ SOLN
4.0000 mg | Freq: Four times a day (QID) | INTRAMUSCULAR | Status: DC | PRN
  Filled 2018-01-31: qty 2

## 2018-01-31 MED ORDER — METOPROLOL TARTRATE 50 MG PO TABS
50.0000 mg | ORAL_TABLET | Freq: Two times a day (BID) | ORAL | Status: DC
  Administered 2018-01-31 – 2018-02-03 (×6): 50 mg via ORAL
  Filled 2018-01-31: qty 1
  Filled 2018-01-31: qty 2
  Filled 2018-01-31: qty 1
  Filled 2018-01-31 (×2): qty 2
  Filled 2018-01-31: qty 1

## 2018-01-31 MED ORDER — MAGNESIUM SULFATE 2 GM/50ML IV SOLN
2.0000 g | Freq: Once | INTRAVENOUS | Status: AC
  Administered 2018-01-31: 2 g via INTRAVENOUS
  Filled 2018-01-31: qty 50

## 2018-01-31 MED ORDER — DILTIAZEM HCL ER 240 MG PO CP24: 240.0000 mg | ORAL_CAPSULE | Freq: Every day | ORAL | Status: DC

## 2018-01-31 MED ORDER — SODIUM CHLORIDE 0.9 % IV SOLN: 1.0000 g | Freq: Once | INTRAVENOUS | Status: DC

## 2018-01-31 MED ORDER — ORAL CARE MOUTH RINSE
15.0000 mL | Freq: Two times a day (BID) | OROMUCOSAL | Status: DC
  Administered 2018-02-02 (×2): 15 mL via OROMUCOSAL

## 2018-01-31 MED ORDER — ORAL CARE MOUTH RINSE
15.0000 mL | Freq: Two times a day (BID) | OROMUCOSAL | Status: DC
  Administered 2018-01-31: 15 mL via OROMUCOSAL

## 2018-01-31 MED ORDER — ACETAMINOPHEN 325 MG PO TABS
650.0000 mg | ORAL_TABLET | ORAL | Status: DC | PRN
  Administered 2018-02-01: 650 mg via ORAL
  Filled 2018-01-31 (×2): qty 2

## 2018-01-31 MED ORDER — KETOTIFEN FUMARATE 0.025 % OP SOLN: 1.0000 [drp] | Freq: Three times a day (TID) | OPHTHALMIC | Status: DC | PRN

## 2018-01-31 MED ORDER — SERTRALINE HCL 100 MG PO TABS
100.0000 mg | ORAL_TABLET | Freq: Every day | ORAL | Status: DC
  Administered 2018-02-01 – 2018-02-03 (×3): 100 mg via ORAL
  Filled 2018-01-31: qty 1
  Filled 2018-01-31: qty 2
  Filled 2018-01-31: qty 1

## 2018-01-31 MED ORDER — FAMOTIDINE 20 MG PO TABS
20.0000 mg | ORAL_TABLET | Freq: Two times a day (BID) | ORAL | Status: DC
  Administered 2018-01-31 – 2018-02-03 (×6): 20 mg via ORAL
  Filled 2018-01-31 (×6): qty 1

## 2018-01-31 MED ORDER — CHLORHEXIDINE GLUCONATE 0.12 % MT SOLN
15.0000 mL | Freq: Two times a day (BID) | OROMUCOSAL | Status: DC
  Administered 2018-02-02 – 2018-02-03 (×2): 15 mL via OROMUCOSAL
  Filled 2018-01-31 (×5): qty 15

## 2018-01-31 MED ORDER — DILTIAZEM LOAD VIA INFUSION
10.0000 mg | Freq: Once | INTRAVENOUS | Status: AC
  Administered 2018-01-31: 10 mg via INTRAVENOUS
  Filled 2018-01-31: qty 10

## 2018-01-31 MED ORDER — GABAPENTIN 100 MG PO CAPS
100.0000 mg | ORAL_CAPSULE | Freq: Two times a day (BID) | ORAL | Status: DC
  Administered 2018-01-31 – 2018-02-03 (×6): 100 mg via ORAL
  Filled 2018-01-31 (×6): qty 1

## 2018-01-31 MED ORDER — METFORMIN HCL 500 MG PO TABS
500.0000 mg | ORAL_TABLET | Freq: Two times a day (BID) | ORAL | Status: DC
  Administered 2018-02-01 – 2018-02-03 (×6): 500 mg via ORAL
  Filled 2018-01-31 (×7): qty 1

## 2018-01-31 MED ORDER — LEVOTHYROXINE SODIUM 75 MCG PO TABS
75.0000 ug | ORAL_TABLET | Freq: Every day | ORAL | Status: DC
  Administered 2018-02-01 – 2018-02-03 (×3): 75 ug via ORAL
  Filled 2018-01-31 (×3): qty 1

## 2018-01-31 MED ORDER — DILTIAZEM HCL-DEXTROSE 100-5 MG/100ML-% IV SOLN (PREMIX)
5.0000 mg/h | INTRAVENOUS | Status: DC
  Administered 2018-01-31: 5 mg/h via INTRAVENOUS
  Filled 2018-01-31 (×3): qty 100

## 2018-01-31 MED ORDER — INFLUENZA VAC SPLIT QUAD 0.5 ML IM SUSY
0.5000 mL | PREFILLED_SYRINGE | INTRAMUSCULAR | Status: AC
  Administered 2018-02-01: 0.5 mL via INTRAMUSCULAR
  Filled 2018-01-31: qty 0.5

## 2018-01-31 MED ORDER — LEVALBUTEROL HCL 1.25 MG/0.5ML IN NEBU: 1.2500 mg | INHALATION_SOLUTION | Freq: Three times a day (TID) | RESPIRATORY_TRACT | Status: DC | PRN

## 2018-01-31 MED ORDER — BUPRENORPHINE HCL-NALOXONE HCL 8-2 MG SL SUBL
1.0000 | SUBLINGUAL_TABLET | Freq: Two times a day (BID) | SUBLINGUAL | Status: DC
  Administered 2018-02-01 – 2018-02-03 (×5): 1 via SUBLINGUAL
  Filled 2018-01-31 (×6): qty 1

## 2018-01-31 NOTE — ED Provider Notes (Signed)
[facility] DEPT Provider Note   CSN: [id number] Arrival date & time: 01/31/18  1530     History   Chief Complaint No chief complaint on file.   HPI [name] is a 62 y.o. female.  The history is provided by the patient and medical records. No language interpreter was used.  Flank Pain  This is a recurrent problem. The current episode started more than 1 week ago. The problem occurs constantly. The problem has not changed since onset.Associated symptoms include abdominal pain and headaches. Pertinent negatives include no chest pain and no shortness of breath. Nothing aggravates the symptoms. Nothing relieves the symptoms. She has tried nothing for the symptoms. The treatment provided no relief.    Past Medical History:  Diagnosis Date  . Acute and chronic respiratory failure with hypercapnia ([place])   . ADD (attention deficit disorder)   . Altered mental status    2nd to hypercapnea  . CHF (congestive heart failure) ([place])   . Chronic low back pain   . Complication of anesthesia    " I AM SLOW TO WAKE UP AT ITMES "  . Coronary artery disease   . Depression   . Diabetes mellitus   . Endometrial cancer ([place])   . GERD (gastroesophageal reflux disease)   . Hypercapnia   . Hypertension   . Hypothyroidism   . Hypoxemia   . Morbid obesity ([place])   . Nephrolithiasis   . OSA (obstructive sleep apnea)   . Paroxysmal atrial fibrillation ([facility])   . Restless leg syndrome     Patient Active Problem List   Diagnosis Date Noted  . Diastolic CHF ([place]) 40/07/6760  . Asthma 10/23/2017  . Anxiety and depression 09/05/2017  . At risk for adverse drug reaction 09/04/2017  . GERD (gastroesophageal reflux disease) 09/04/2017  . Obesity hypoventilation syndrome ([place])   . Rhinovirus infection   . COPD with acute exacerbation ([place])   . Systolic and diastolic CHF, chronic ([place])   . Noncompliance   . Acute on chronic respiratory failure with hypoxia and  hypercapnia ([place]) 08/26/2017  . Hypothyroidism 08/26/2017  . Hypochromic anemia 08/26/2017  . Pressure injury of skin 07/03/2017  . Cellulitis of leg, left 07/02/2017  . Obesity, Class III, BMI 40-49.9 (morbid obesity) ([place]) 07/02/2017  . Acute on chronic combined systolic and diastolic CHF (congestive heart failure) ([place]) 02/10/2016  . Seizures ([place])   . Aspiration pneumonia ([place])   . Sepsis ([place])   . Acute on chronic respiratory failure ([place]) 12/25/2015  . Pneumonia 11/09/2014  . CAP (community acquired pneumonia) 10/08/2014  . Atrial fibrillation ([place]) 04/11/2014  . Narcotic overdose ([place]) 07/07/2013  . Hypoxia 07/07/2013  . Diabetes mellitus ([place]) 07/07/2013  . HTN (hypertension) 07/07/2013  . Chronic pain 07/07/2013  . Hypercapnic respiratory failure, chronic ([place]) 10/17/2011  . OSA (obstructive sleep apnea) 12/23/2007    Past Surgical History:  Procedure Laterality Date  . BACK SURGERY     x2  . DENTAL SURGERY    . SHOULDER SURGERY     x2  . SP PERC NEPHROSTOMY    . TONSILLECTOMY    . VAGINAL HYSTERECTOMY     x2    OB History    No data available       Home Medications    Prior to Admission medications   Medication Sig Start Date End Date Taking? Authorizing Provider  buprenorphine (SUBUTEX) 8 MG SUBL SL tablet Place 1 tablet (8 mg total) under  the tongue 2 (two) times daily. 08/31/17   Medina-Vargas, Monina C, NP  cetirizine (ZYRTEC) 10 MG tablet Take 10 mg by mouth at bedtime.    [provider]  clotrimazole-betamethasone (LOTRISONE) cream Apply 1 application topically daily as needed (antifungal). 09/18/17   Medina-Vargas, Monina C, NP  diltiazem (DILACOR XR) 240 MG 24 hr capsule Take 1 capsule (240 mg total) by mouth daily. 09/18/17   Medina-Vargas, Monina C, NP  furosemide (LASIX) 40 MG tablet Take 1 tablet (40 mg total) by mouth 2 (two) times daily. 09/18/17   Medina-Vargas, Monina C, NP  gabapentin (NEURONTIN) 100 MG capsule Take 1 capsule (100 mg  total) by mouth 2 (two) times daily. 09/18/17   Medina-Vargas, Monina C, NP  ketotifen (ZADITOR) 0.025 % ophthalmic solution Place 1 drop into both eyes 3 (three) times daily as needed. 09/18/17   Medina-Vargas, Monina C, NP  levalbuterol (XOPENEX) 1.25 MG/0.5ML nebulizer solution Take 1.25 mg by nebulization daily as needed for wheezing or shortness of breath.    [provider]  levalbuterol (XOPENEX) 1.25 MG/0.5ML nebulizer solution Take 1.25 mg 3 (three) times daily as needed by nebulization for wheezing or shortness of breath. 10/26/17   Parrett, Fonnie Mu, NP  metoprolol tartrate (LOPRESSOR) 50 MG tablet Take 1 tablet (50 mg total) by mouth 2 (two) times daily. 09/18/17   Medina-Vargas, Monina C, NP  potassium chloride (K-DUR) 10 MEQ tablet Take 1 tablet (10 mEq total) by mouth 2 (two) times daily. 09/18/17   Medina-Vargas, Monina C, NP  prochlorperazine (COMPAZINE) 10 MG tablet Take 1 tablet (10 mg total) by mouth every 6 (six) hours as needed for nausea or vomiting. 09/18/17   Medina-Vargas, Monina C, NP  ranitidine (ZANTAC) 150 MG tablet Take 150 mg 2 (two) times daily by mouth.    [provider]  sertraline (ZOLOFT) 100 MG tablet Take 1 tablet (100 mg total) by mouth daily. 09/18/17   Medina-Vargas, Monina C, NP  SYNTHROID 75 MCG tablet Take 1 tablet (75 mcg total) by mouth daily before breakfast. 09/18/17   Medina-Vargas, Monina C, NP    Family History Family History  Problem Relation Age of Onset  . Cancer Father     Social History Social History   Tobacco Use  . Smoking status: Former Smoker    Packs/day: 0.50    Years: 2.00    Pack years: 1.00    Types: Cigarettes    Last attempt to quit: 09/10/1974    Years since quitting: 43.4  . Smokeless tobacco: Never Used  . Tobacco comment: quit in teenage years  Substance Use Topics  . Alcohol use: No  . Drug use: No     Allergies   Aspirin; Eliquis [apixaban]; Nsaids; Other; Plavix [clopidogrel]; Sudafed  [pseudoephedrine]; Erythromycin; Sulfa antibiotics; Sulfamethoxazole-trimethoprim; Altace [ramipril]; and Zofran [ondansetron hcl]   Review of Systems Review of Systems  Constitutional: Negative for chills, diaphoresis, fatigue and fever.  HENT: Negative for congestion and rhinorrhea.   Respiratory: Negative for cough, chest tightness, shortness of breath, wheezing and stridor.   Cardiovascular: Negative for chest pain and palpitations.  Gastrointestinal: Positive for abdominal pain. Negative for diarrhea, nausea and vomiting.  Genitourinary: Positive for flank pain and urgency. Negative for difficulty urinating and dysuria.  Musculoskeletal: Negative for back pain, neck pain and neck stiffness.  Skin: Negative for wound.  Neurological: Positive for headaches. Negative for weakness, light-headedness and numbness.  Psychiatric/Behavioral: Negative for agitation.  All other systems reviewed and are negative.  Physical Exam Updated Vital Signs There were no vitals taken for this visit.  Physical Exam  Constitutional: She is oriented to person, place, and time. She appears well-developed and well-nourished. No distress.  HENT:  Head: Normocephalic and atraumatic.  Mouth/Throat: Oropharynx is clear and moist. No oropharyngeal exudate.  Eyes: Conjunctivae and EOM are normal. Pupils are equal, round, and reactive to light.  Neck: Normal range of motion.  Cardiovascular: Intact distal pulses. Tachycardia present.  Murmur heard. Pulmonary/Chest: Effort normal. No stridor. No respiratory distress. She has no wheezes. She exhibits no tenderness.  Abdominal: Soft. Bowel sounds are normal. She exhibits no mass. There is tenderness. There is no rigidity, no rebound, no guarding and no CVA tenderness.    Musculoskeletal: She exhibits edema and tenderness. She exhibits no deformity.       Thoracic back: She exhibits tenderness and pain.       Back:  Neurological: She is alert and oriented  to person, place, and time. No sensory deficit. She exhibits normal muscle tone.  Skin: Capillary refill takes less than 2 seconds. No rash noted. She is not diaphoretic. No erythema.  Nursing note and vitals reviewed.    ED Treatments / Results  Labs (all labs ordered are listed, but only abnormal results are displayed) Labs Reviewed  MAGNESIUM - Abnormal; Notable for the following components:      Result Value   Magnesium 1.5 (*)    All other components within normal limits  BRAIN NATRIURETIC PEPTIDE - Abnormal; Notable for the following components:   B Natriuretic Peptide 216.9 (*)    All other components within normal limits  CBC WITH DIFFERENTIAL/PLATELET - Abnormal; Notable for the following components:   Hemoglobin 10.5 (*)    HCT 34.6 (*)    MCH 25.8 (*)    RDW 16.1 (*)    Platelets 149 (*)    Lymphs Abs 0.6 (*)    All other components within normal limits  COMPREHENSIVE METABOLIC PANEL - Abnormal; Notable for the following components:   Chloride 98 (*)    Glucose, Bld 145 (*)    Calcium 8.2 (*)    Albumin 2.9 (*)    AST 13 (*)    ALT 11 (*)    All other components within normal limits  URINALYSIS, ROUTINE W REFLEX MICROSCOPIC - Abnormal; Notable for the following components:   APPearance CLOUDY (*)    Hgb urine dipstick LARGE (*)    Protein, ur 100 (*)    Nitrite POSITIVE (*)    Leukocytes, UA LARGE (*)    Bacteria, UA MANY (*)    Squamous Epithelial / LPF 0-5 (*)    All other components within normal limits  MRSA PCR SCREENING  URINE CULTURE  CULTURE, BLOOD (ROUTINE X 2)  CULTURE, BLOOD (ROUTINE X 2)  LIPASE, BLOOD  I-STAT TROPONIN, ED  I-STAT CG4 LACTIC ACID, ED    EKG  EKG Interpretation  Date/Time:  Thursday January 31 2018 17:08:29 EST Ventricular Rate:  142 PR Interval:    QRS Duration: 83 QT Interval:  314 QTC Calculation: 480 R Axis:   71 Text Interpretation:  Atrial fibrillation Borderline repolarization abnormality Baseline wander in  lead(s) V6 When compared to prior, still in afib but now with RVR.  No STEMI Confirmed by Antony Blackbird [phone]) on 01/31/2018 6:15:10 PM       Radiology Dg Chest 2 View  Result Date: 01/31/2018 CLINICAL DATA:  Tachycardia EXAM: CHEST  2 VIEW COMPARISON:  08/27/2017 FINDINGS:  Normal heart size and mediastinal contours. Low volume chest with elevated right diaphragm. Mild atelectatic type opacity at the right base. No focal airspace disease based on the lateral view and preceding abdominal CT. No effusion or edema. Exaggerated thoracic kyphosis. IMPRESSION: Low volume chest with mild atelectasis. Electronically Signed   By: Monte Fantasia M.D.   On: 01/31/2018 17:45   Ct Renal Stone Study  Result Date: 01/31/2018 CLINICAL DATA:  Flank pain. EXAM: CT ABDOMEN AND PELVIS WITHOUT CONTRAST TECHNIQUE: Multidetector CT imaging of the abdomen and pelvis was performed following the standard protocol without IV contrast. COMPARISON:  CT abdomen pelvis dated November 16, 2017. FINDINGS: Lower chest: No acute abnormality. Hepatobiliary: Diffuse hepatic steatosis. Mildly distended gallbladder containing small gallstones. No gallbladder wall thickening or biliary dilatation. Pancreas: Unremarkable. No pancreatic ductal dilatation or surrounding inflammatory changes. Spleen: Normal in size without focal abnormality. Adrenals/Urinary Tract: The adrenal glands are unremarkable. Grossly unchanged large 2.4 cm calculus in the right renal pelvis. Unchanged small calculi in the right renal pyramids. Slightly more prominent perinephric and periureteral inflammatory changes. The previously seen 8 mm calculus in the lower pole of the left kidney is now in the renal pelvis, along with an adjacent 5 mm calculus. No ureteral calculi or hydronephrosis. The bladder is decompressed. Stomach/Bowel: Stomach is within normal limits. Appendix appears normal. No evidence of bowel wall thickening, distention, or inflammatory changes.  Vascular/Lymphatic: Aortic atherosclerosis. Prominent subcentimeter retroperitoneal lymph nodes are not enlarged by CT size criteria and likely reactive. Reproductive: Status post hysterectomy. No adnexal masses. Other: Small fat containing umbilical hernia. No free fluid or pneumoperitoneum. Musculoskeletal: No acute or significant osseous findings. IMPRESSION: 1. Unchanged large 2.4 cm calculus in the right renal pelvis, with additional smaller calculi in the right renal pyramids. Slightly more prominent right perinephric and periureteral inflammatory changes. Correlate with urinalysis 2. Two calculi measuring up to 8 mm in the left renal pelvis. 3. No ureteral calculi or hydronephrosis. 4. Diffuse hepatic steatosis. 5. Cholelithiasis. 6.  Aortic atherosclerosis (ICD10-I70.0). Electronically Signed   By: Titus Dubin M.D.   On: 01/31/2018 17:10    Procedures Procedures (including critical care time)  CRITICAL CARE Performed by: Gwenyth Allegra Braxden [place] Total critical care time: 45 minutes Critical care time was exclusive of separately billable procedures and treating other patients. Pyelonephritis and A fib with RVR needing Dilt Drip Critical care was necessary to treat or prevent imminent or life-threatening deterioration. Critical care was time spent personally by me on the following activities: development of treatment plan with patient and/or surrogate as well as nursing, discussions with consultants, evaluation of patient's response to treatment, examination of patient, obtaining history from patient or surrogate, ordering and performing treatments and interventions, ordering and review of laboratory studies, ordering and review of radiographic studies, pulse oximetry and re-evaluation of patient's condition.   Medications Ordered in ED Medications  diltiazem (CARDIZEM) 1 mg/mL load via infusion 10 mg (10 mg Intravenous Bolus from Bag 01/31/18 2133)    And  diltiazem (CARDIZEM) 100 mg in  dextrose 5% 140mL (1 mg/mL) infusion (5 mg/hr Intravenous Rate/Dose Verify 02/01/18 0100)  buprenorphine-naloxone (SUBOXONE) 8-2 mg per SL tablet 1 tablet (1 tablet Sublingual Not Given 01/31/18 2132)  gabapentin (NEURONTIN) capsule 100 mg (100 mg Oral Given 01/31/18 2132)  ketotifen (ZADITOR) 0.025 % ophthalmic solution 1 drop (not administered)  levalbuterol (XOPENEX) nebulizer solution 1.25 mg (not administered)  metFORMIN (GLUCOPHAGE) tablet 500 mg (not administered)  metoprolol tartrate (LOPRESSOR) tablet 50 mg (50 mg  Oral Given 01/31/18 2132)  levothyroxine (SYNTHROID, LEVOTHROID) tablet 75 mcg (not administered)  sertraline (ZOLOFT) tablet 100 mg (not administered)  famotidine (PEPCID) tablet 20 mg (20 mg Oral Given 01/31/18 2132)  cefTRIAXone (ROCEPHIN) 1 g in sodium chloride 0.9 % 100 mL IVPB (0 g Intravenous Stopped 01/31/18 2207)  acetaminophen (TYLENOL) tablet 650 mg (650 mg Oral Given 02/01/18 0029)  ondansetron (ZOFRAN) injection 4 mg (not administered)  Influenza vac split quadrivalent PF (FLUARIX) injection 0.5 mL (not administered)  chlorhexidine (PERIDEX) 0.12 % solution 15 mL (15 mLs Mouth Rinse Not Given 01/31/18 2315)  MEDLINE mouth rinse (not administered)  magnesium sulfate IVPB 2 g 50 mL (0 g Intravenous Stopped 01/31/18 2232)     Initial Impression / Assessment and Plan / ED Course  I have reviewed the triage vital signs and the nursing notes.  Pertinent labs & imaging results that were available during my care of the patient were reviewed by me and considered in my medical decision making (see chart for details).     JERRIYAH LOUIS is a 62 y.o. female past medical history significant for CAD, CHF, GERD, paroxysmal atrial fibrillation not on blood thinners due to prior GI bleed, diabetes, and known kidney stones who presents with fatigue, malaise, bilateral flank pain, and urinary urgency and frequency.  Patient reports that for the last 3 weeks she has had bilateral flank  pain that was mild.  She reports over the last week it has acutely worsened and is now a constant 5 out of 10 in severity.  It radiates in both sides towards her back.  She reports it feels slightly different than prior kidney stones as previously it was more sharp.  She says that there is left side worse than right.  She reports some intermittent diarrhea that has resolved.  She reports no nausea no vomiting.  She says that she has had decreased oral intake.  She denies any chest pain, palpitations, or shortness of breath.  She denies any constipation  or other complaints.  On exam, patient found to be tachycardic with heart rate in the 140s.  Patient's abdomen was nontender however flanks and back was slightly tender bilaterally.  Legs were edematous bilaterally.  Lungs were clear.  Chest was nontender.  Patient did have a murmur with her tachycardia.  Patient had palpable pulses in all extremities.  Given patient's history of stones, I am concerned flank pain may be related to kidney stones.  Patient will have a CT stone study to further evaluate.  Patient will also have workup to look for etiology of her tachycardia be it dehydration, occult infection or electrolyte abnormalities.  Patient will have urinalysis, chest x-ray and EKG.  Due to patient's swollen legs and history of CHF, will hold on rehydration until BNP and x-ray is completed.  Anticipate reassessment after workup.  7:53 PM EKG showed patient is in A. fib with RVR.  Suspect this is due to the urinary tract infection that was found on urinalysis.  Given the patient's pain going to her flanks I am concerned about pyelonephritis.  Patient will be started on a diltiazem bolus and drip and will also be given antibiotics for UTI.  Blood cultures will be obtained.  Laboratory testing otherwise showed a normal lactic acid and normal troponin.  Magnesium was slightly decreased, will replete with IV mag.  Chest x-ray shows no evidence of pneumonia.   CT scan showed persistent stones but no evidence of acute obstruction.  Due to the urinary tract infection/pyelonephritis that appears to be sending the patient into A. fib with RVR, patient will be admitted for further management.  Patient reports that she feels terrible and is minimal to admission.  Hospitalist team called for admission.   Final Clinical Impressions(s) / ED Diagnoses   Final diagnoses:  Pyelonephritis  Atrial fibrillation with RVR [facility])    ED Discharge Orders    None     Clinical Impression: 1. Pyelonephritis   2. Atrial fibrillation with RVR ([place])     Disposition: Admit  This note was prepared with assistance of Dragon voice recognition software. Occasional wrong-word or sound-a-like substitutions may have occurred due to the inherent limitations of voice recognition software.      Talyssa Gibas, Gwenyth Allegra, MD 02/01/18 [phone]

## 2018-01-31 NOTE — ED Notes (Signed)
ED TO INPATIENT HANDOFF REPORT  Name/Age/Gender [name] 62 y.o. female  Code Status    Code Status Orders  (From admission, onward)        Start     Ordered   01/31/18 2016  Full code  Continuous     01/31/18 2016    Code Status History    Date Active Date Inactive Code Status Order ID Comments User Context   08/26/2017 01:27 08/30/2017 21:35 Full Code [id number]  Norval Morton, MD ED   07/02/2017 16:12 07/11/2017 22:15 Full Code [id number]  Erline Hau, MD ED   02/10/2016 21:44 02/13/2016 18:13 Full Code [id number]  Dixie Dials, MD ED   12/25/2015 21:34 01/02/2016 21:12 Full Code [id number]  Collene Gobble, MD Inpatient   11/09/2014 03:16 11/11/2014 19:41 Full Code [id number]  Dixie Dials, MD Inpatient   04/12/2014 01:19 04/14/2014 00:38 Full Code [id number]  Charolette Forward, MD Inpatient      Home/SNF/Other Home  Chief Complaint Flank Pain  Level of Care/Admitting Diagnosis ED Disposition    ED Disposition Condition [facility]: [facility] [[id number]]  Level of Care: Stepdown [14]  Admit to SDU based on following criteria: Cardiac Instability:  Patients experiencing chest pain, unconfirmed MI and stable, arrhythmias and CHF requiring medical management and potentially compromising patient's stability  Diagnosis: Atrial fibrillation with RVR [facility]) [[id number]]  Admitting Physician: Etta Quill [phone]  Attending Physician: Etta Quill [[id number]]  PT Class (Do Not Modify): Observation [104]  PT Acc Code (Do Not Modify): Observation [10022]       Medical History Past Medical History:  Diagnosis Date  . Acute and chronic respiratory failure with hypercapnia ([place])   . ADD (attention deficit disorder)   . Altered mental status    2nd to hypercapnea  . CHF (congestive heart failure) ([place])   . Chronic low back pain   . Complication of anesthesia    " I AM SLOW TO WAKE UP AT ITMES "  . Coronary artery disease   .  Depression   . Diabetes mellitus   . Endometrial cancer ([place])   . GERD (gastroesophageal reflux disease)   . Hypercapnia   . Hypertension   . Hypothyroidism   . Hypoxemia   . Morbid obesity ([place])   . Nephrolithiasis   . OSA (obstructive sleep apnea)   . Paroxysmal atrial fibrillation ([facility])   . Restless leg syndrome     Allergies Allergies  Allergen Reactions  . Aspirin Other (See Comments)    Chest pain  . Eliquis [Apixaban]     Excessive bleeding  . Nsaids Other (See Comments)    Chest pain  . Other Other (See Comments)    "BP med" combined with plavix, caused syncope  . Plavix [Clopidogrel] Other (See Comments)    syncope  . Sudafed [Pseudoephedrine] Palpitations    Raised BP  . Erythromycin Nausea And Vomiting  . Sulfa Antibiotics Other (See Comments)    constipation  . Sulfamethoxazole-Trimethoprim Diarrhea  . Altace [Ramipril] Other (See Comments)    Severe fatigue  . Zofran [Ondansetron Hcl] Other (See Comments)    "I put me into a coma"    IV Location/Drains/Wounds Patient Lines/Drains/Airways Status   Active Line/Drains/Airways    Name:   Placement date:   Placement time:   Site:   Days:   Peripheral IV 08/25/17 Right Antecubital   08/25/17    2257    Antecubital  159   Peripheral IV 01/31/18 Left Arm   01/31/18    1644    Arm   less than 1   External Urinary Catheter   08/29/17    1117    -   155   Pressure Injury 08/26/17 Stage II -  Partial thickness loss of dermis presenting as a shallow open ulcer with a red, pink wound bed without slough. right buttock at crease; 5mXmm;    08/26/17    1630     158   Wound / Incision (Open or Dehisced) 07/07/17 Non-pressure wound Leg Left;Lower cellulitis   07/07/17    0854    Leg   208          Labs/Imaging Results for orders placed or performed during the hospital encounter of 01/31/18 (from the past 48 hour(s))  Magnesium     Status: Abnormal   Collection Time: 01/31/18  4:29 PM  Result Value Ref Range    Magnesium 1.5 (L) 1.7 - 2.4 mg/dL    Comment: [facility] [address] [place] [address] CBC with Differential     Status: Abnormal   Collection Time: 01/31/18  4:29 PM  Result Value Ref Range   WBC 7.3 4.0 - 10.5 K/uL   RBC 4.07 3.87 - 5.11 MIL/uL   Hemoglobin 10.5 (L) 12.0 - 15.0 g/dL   HCT 34.6 (L) 36.0 - 46.0 %   MCV 85.0 78.0 - 100.0 fL   MCH 25.8 (L) 26.0 - 34.0 pg   MCHC 30.3 30.0 - 36.0 g/dL   RDW 16.1 (H) 11.5 - 15.5 %   Platelets 149 (L) 150 - 400 K/uL   Neutrophils Relative % 85 %   Neutro Abs 6.2 1.7 - 7.7 K/uL   Lymphocytes Relative 8 %   Lymphs Abs 0.6 (L) 0.7 - 4.0 K/uL   Monocytes Relative 7 %   Monocytes Absolute 0.5 0.1 - 1.0 K/uL   Eosinophils Relative 0 %   Eosinophils Absolute 0.0 0.0 - 0.7 K/uL   Basophils Relative 0 %   Basophils Absolute 0.0 0.0 - 0.1 K/uL    Comment: Performed at [facility] [address], [place] [id number] Comprehensive metabolic panel     Status: Abnormal   Collection Time: 01/31/18  4:29 PM  Result Value Ref Range   Sodium 136 135 - 145 mmol/L   Potassium 3.8 3.5 - 5.1 mmol/L   Chloride 98 (L) 101 - 111 mmol/L   CO2 28 22 - 32 mmol/L   Glucose, Bld 145 (H) 65 - 99 mg/dL   BUN 17 6 - 20 mg/dL   Creatinine, Ser 0.95 0.44 - 1.00 mg/dL   Calcium 8.2 (L) 8.9 - 10.3 mg/dL   Total Protein 7.0 6.5 - 8.1 g/dL   Albumin 2.9 (L) 3.5 - 5.0 g/dL   AST 13 (L) 15 - 41 U/L   ALT 11 (L) 14 - 54 U/L   Alkaline Phosphatase 89 38 - 126 U/L   Total Bilirubin 0.8 0.3 - 1.2 mg/dL   GFR calc non Af Amer >60 >60 mL/min   GFR calc Af Amer >60 >60 mL/min    Comment: (NOTE) The eGFR has been calculated using the CKD EPI equation. This calculation has not been validated in all clinical situations. eGFR's persistently <60 mL/min signify possible Chronic Kidney Disease.    Anion gap 10 5 - 15    Comment: Performed at  [facility], [place] [address]  [place], [place] [address]  Lipase, blood     Status: None   Collection Time: 01/31/18  4:29 PM  Result Value Ref Range   Lipase 24 11 - 51 U/L    Comment: Performed at [facility], [place] [address] [place], [place] [address]  I-Stat Troponin, ED (not at [facility])     Status: None   Collection Time: 01/31/18  4:40 PM  Result Value Ref Range   Troponin i, poc 0.06 0.00 - 0.08 ng/mL   Comment 3            Comment: Due to the release kinetics of cTnI, a negative result within the first hours of the onset of symptoms does not rule out myocardial infarction with certainty. If myocardial infarction is still suspected, repeat the test at appropriate intervals.   I-Stat CG4 Lactic Acid, ED     Status: None   Collection Time: 01/31/18  4:42 PM  Result Value Ref Range   Lactic Acid, Venous 1.10 0.5 - 1.9 mmol/L  Urinalysis, Routine w reflex microscopic     Status: Abnormal   Collection Time: 01/31/18  6:23 PM  Result Value Ref Range   Color, Urine YELLOW YELLOW   APPearance CLOUDY (A) CLEAR   Specific Gravity, Urine 1.014 1.005 - 1.030   pH 5.0 5.0 - 8.0   Glucose, UA NEGATIVE NEGATIVE mg/dL   Hgb urine dipstick LARGE (A) NEGATIVE   Bilirubin Urine NEGATIVE NEGATIVE   Ketones, ur NEGATIVE NEGATIVE mg/dL   Protein, ur 100 (A) NEGATIVE mg/dL   Nitrite POSITIVE (A) NEGATIVE   Leukocytes, UA LARGE (A) NEGATIVE   RBC / HPF TOO NUMEROUS TO COUNT 0 - 5 RBC/hpf   WBC, UA TOO NUMEROUS TO COUNT 0 - 5 WBC/hpf   Bacteria, UA MANY (A) NONE SEEN   Squamous Epithelial / LPF 0-5 (A) NONE SEEN   WBC Clumps PRESENT    Mucus PRESENT     Comment: Performed at [facility], [place] [address] [place], [place] [address]   Dg Chest 2 View  Result Date: 01/31/2018 CLINICAL DATA:  Tachycardia EXAM: CHEST  2 VIEW COMPARISON:  08/27/2017 FINDINGS: Normal heart size and mediastinal contours. Low volume chest with elevated right diaphragm. Mild atelectatic type opacity at the right  base. No focal airspace disease based on the lateral view and preceding abdominal CT. No effusion or edema. Exaggerated thoracic kyphosis. IMPRESSION: Low volume chest with mild atelectasis. Electronically Signed   By: Monte Fantasia M.D.   On: 01/31/2018 17:45   Ct Renal Stone Study  Result Date: 01/31/2018 CLINICAL DATA:  Flank pain. EXAM: CT ABDOMEN AND PELVIS WITHOUT CONTRAST TECHNIQUE: Multidetector CT imaging of the abdomen and pelvis was performed following the standard protocol without IV contrast. COMPARISON:  CT abdomen pelvis dated November 16, 2017. FINDINGS: Lower chest: No acute abnormality. Hepatobiliary: Diffuse hepatic steatosis. Mildly distended gallbladder containing small gallstones. No gallbladder wall thickening or biliary dilatation. Pancreas: Unremarkable. No pancreatic ductal dilatation or surrounding inflammatory changes. Spleen: Normal in size without focal abnormality. Adrenals/Urinary Tract: The adrenal glands are unremarkable. Grossly unchanged large 2.4 cm calculus in the right renal pelvis. Unchanged small calculi in the right renal pyramids. Slightly more prominent perinephric and periureteral inflammatory changes. The previously seen 8 mm calculus in the lower pole of the left kidney is now in the renal pelvis, along with an adjacent 5 mm calculus. No ureteral calculi or hydronephrosis.  The bladder is decompressed. Stomach/Bowel: Stomach is within normal limits. Appendix appears normal. No evidence of bowel wall thickening, distention, or inflammatory changes. Vascular/Lymphatic: Aortic atherosclerosis. Prominent subcentimeter retroperitoneal lymph nodes are not enlarged by CT size criteria and likely reactive. Reproductive: Status post hysterectomy. No adnexal masses. Other: Small fat containing umbilical hernia. No free fluid or pneumoperitoneum. Musculoskeletal: No acute or significant osseous findings. IMPRESSION: 1. Unchanged large 2.4 cm calculus in the right renal pelvis,  with additional smaller calculi in the right renal pyramids. Slightly more prominent right perinephric and periureteral inflammatory changes. Correlate with urinalysis 2. Two calculi measuring up to 8 mm in the left renal pelvis. 3. No ureteral calculi or hydronephrosis. 4. Diffuse hepatic steatosis. 5. Cholelithiasis. 6.  Aortic atherosclerosis (ICD10-I70.0). Electronically Signed   By: Titus Dubin M.D.   On: 01/31/2018 17:10    Pending Labs Unresulted Labs (From admission, onward)   Start     Ordered   01/31/18 1954  Blood culture (routine x 2)  BLOOD CULTURE X 2,   STAT     01/31/18 1953   01/31/18 1609  Urine culture  STAT,   STAT     01/31/18 1608   01/31/18 1608  Brain natriuretic peptide  Once,   R     01/31/18 1608      Vitals/Pain Today's Vitals   01/31/18 1555 01/31/18 1745 01/31/18 2000  BP: (!) 161/95 (!) 126/99 (!) 142/79  Pulse: (!) 140 (!) 149 (!) 142  Resp: '18 15 16  '$ Temp: 98.4 F (36.9 C)    TempSrc: Oral    SpO2: 95% 99% 97%    Isolation Precautions No active isolations  Medications Medications  diltiazem (CARDIZEM) 1 mg/mL load via infusion 10 mg (not administered)    And  diltiazem (CARDIZEM) 100 mg in dextrose 5% 148m (1 mg/mL) infusion (not administered)  cefTRIAXone (ROCEPHIN) 1 g in sodium chloride 0.9 % 100 mL IVPB (not administered)  magnesium sulfate IVPB 2 g 50 mL (not administered)  buprenorphine-naloxone (SUBOXONE) 8-2 mg per SL tablet 1 tablet (not administered)  diltiazem (DILACOR XR) 24 hr capsule 240 mg (not administered)  gabapentin (NEURONTIN) capsule 100 mg (not administered)  ketotifen (ZADITOR) 0.025 % ophthalmic solution 1 drop (not administered)  levalbuterol (XOPENEX) nebulizer solution 1.25 mg (not administered)  metFORMIN (GLUCOPHAGE) tablet 500 mg (not administered)  metoprolol tartrate (LOPRESSOR) tablet 50 mg (not administered)  levothyroxine (SYNTHROID, LEVOTHROID) tablet 75 mcg (not administered)  sertraline (ZOLOFT)  tablet 100 mg (not administered)  famotidine (PEPCID) tablet 20 mg (not administered)  cefTRIAXone (ROCEPHIN) 1 g in sodium chloride 0.9 % 100 mL IVPB (not administered)  acetaminophen (TYLENOL) tablet 650 mg (not administered)  ondansetron (ZOFRAN) injection 4 mg (not administered)  Influenza vac split quadrivalent PF (FLUARIX) injection 0.5 mL (not administered)    Mobility walks with device

## 2018-01-31 NOTE — ED Notes (Signed)
Patient transported to CT 

## 2018-01-31 NOTE — Progress Notes (Signed)
Pt gave permission to ask questions on nursing admission history in front of her visitor. Lucius Conn BSN, RN-BC Admissions RN 01/31/2018 8:57 PM

## 2018-01-31 NOTE — ED Triage Notes (Signed)
Per EMS, patient from home, c/o flank pain x3 weeks with frequent urination. Reports known stone in right kidney and dark urine.   BP 145/85 RR 20 O2 96% 4L [place] at home CBG 159

## 2018-01-31 NOTE — H&P (Signed)
History and Physical    [name] WUJ:[id number] DOB: 05-10-1956 DOA: 01/31/2018  PCP: Dixie Dials, MD  Patient coming from: Home  I have personally briefly reviewed patient's old medical records in [place]  Chief Complaint: Flank pain  HPI: [name] is a 62 y.o. female with medical history significant of OSA, CHF, paroxysmal atrial fibrillation not on blood thinners due to prior GI bleed, diabetes, and known kidney stones who presents with fatigue, malaise, bilateral flank pain, and urinary urgency and frequency.  Flank pain mild, onset 3 weeks ago, worsened over past week and now constant 5/10.  No N/V.  Decreased PO intake.  Stopped her lasix ~3 weeks ago since swelling in legs has been down, says leg swelling is less than usual at the moment still.   ED Course: A.Fib RVR with rate 140s.  CT shows renal pelvis stones but no obstruction, hydro.  Does appear to have mild pyelo.  UA positive for UTI.  Started on rocephin and cardizem gtt ordered.   Review of Systems: As per HPI otherwise 10 point review of systems negative.   Past Medical History:  Diagnosis Date  . Acute and chronic respiratory failure with hypercapnia ([place])   . ADD (attention deficit disorder)   . Altered mental status    2nd to hypercapnea  . CHF (congestive heart failure) ([place])   . Chronic low back pain   . Complication of anesthesia    " I AM SLOW TO WAKE UP AT ITMES "  . Coronary artery disease   . Depression   . Diabetes mellitus   . Endometrial cancer ([place])   . GERD (gastroesophageal reflux disease)   . Hypercapnia   . Hypertension   . Hypothyroidism   . Hypoxemia   . Morbid obesity ([place])   . Nephrolithiasis   . OSA (obstructive sleep apnea)   . Paroxysmal atrial fibrillation (HCC)   . Restless leg syndrome     Past Surgical History:  Procedure Laterality Date  . BACK SURGERY     x2  . DENTAL SURGERY    . SHOULDER SURGERY     x2  . SP PERC NEPHROSTOMY    .  TONSILLECTOMY    . VAGINAL HYSTERECTOMY     x2     reports that she quit smoking about 43 years ago. Her smoking use included cigarettes. She has a 1.00 pack-year smoking history. she has never used smokeless tobacco. She reports that she does not drink alcohol or use drugs.  Allergies  Allergen Reactions  . Aspirin Other (See Comments)    Chest pain  . Eliquis [Apixaban]     Excessive bleeding  . Nsaids Other (See Comments)    Chest pain  . Other Other (See Comments)    "BP med" combined with plavix, caused syncope  . Plavix [Clopidogrel] Other (See Comments)    syncope  . Sudafed [Pseudoephedrine] Palpitations    Raised BP  . Erythromycin Nausea And Vomiting  . Sulfa Antibiotics Other (See Comments)    constipation  . Sulfamethoxazole-Trimethoprim Diarrhea  . Altace [Ramipril] Other (See Comments)    Severe fatigue  . Zofran [Ondansetron Hcl] Other (See Comments)    "I put me into a coma"    Family History  Problem Relation Age of Onset  . Cancer Father      Prior to Admission medications   Medication Sig Start Date End Date Taking? Authorizing Provider  buprenorphine (SUBUTEX) 8 MG SUBL SL  tablet Place 1 tablet (8 mg total) under the tongue 2 (two) times daily. 08/31/17  Yes Medina-Vargas, Monina C, NP  cetirizine (ZYRTEC) 10 MG tablet Take 10 mg by mouth at bedtime.   Yes [provider]  Cholecalciferol (VITAMIN D PO) Take 2,000 mg by mouth daily.   Yes [provider]  diltiazem (DILACOR XR) 240 MG 24 hr capsule Take 1 capsule (240 mg total) by mouth daily. 09/18/17  Yes Medina-Vargas, Monina C, NP  furosemide (LASIX) 40 MG tablet Take 1 tablet (40 mg total) by mouth 2 (two) times daily. 09/18/17  Yes Medina-Vargas, Monina C, NP  gabapentin (NEURONTIN) 100 MG capsule Take 1 capsule (100 mg total) by mouth 2 (two) times daily. 09/18/17  Yes Medina-Vargas, Monina C, NP  ketotifen (ZADITOR) 0.025 % ophthalmic solution Place 1 drop into both eyes 3 (three)  times daily as needed. 09/18/17  Yes Medina-Vargas, Monina C, NP  levalbuterol (XOPENEX) 1.25 MG/0.5ML nebulizer solution Take 1.25 mg 3 (three) times daily as needed by nebulization for wheezing or shortness of breath. 10/26/17  Yes Parrett, Tammy S, NP  metFORMIN (GLUCOPHAGE) 500 MG tablet Take 500 mg by mouth 2 (two) times daily. 12/24/17  Yes [provider]  metoprolol tartrate (LOPRESSOR) 50 MG tablet Take 1 tablet (50 mg total) by mouth 2 (two) times daily. 09/18/17  Yes Medina-Vargas, Monina C, NP  Multiple Vitamins-Minerals (HAIR SKIN NAILS PO) Take 1 tablet by mouth daily.   Yes [provider]  potassium chloride (K-DUR) 10 MEQ tablet Take 1 tablet (10 mEq total) by mouth 2 (two) times daily. 09/18/17  Yes Medina-Vargas, Monina C, NP  prochlorperazine (COMPAZINE) 10 MG tablet Take 1 tablet (10 mg total) by mouth every 6 (six) hours as needed for nausea or vomiting. 09/18/17  Yes Medina-Vargas, Monina C, NP  ranitidine (ZANTAC) 150 MG tablet Take 150 mg 2 (two) times daily by mouth.   Yes [provider]  sertraline (ZOLOFT) 100 MG tablet Take 1 tablet (100 mg total) by mouth daily. 09/18/17  Yes Medina-Vargas, Monina C, NP  SYNTHROID 75 MCG tablet Take 1 tablet (75 mcg total) by mouth daily before breakfast. 09/18/17  Yes Medina-Vargas, Monina C, NP  clotrimazole-betamethasone (LOTRISONE) cream Apply 1 application topically daily as needed (antifungal). Patient not taking: Reported on 01/31/2018 09/18/17   Nickola Major, NP    Physical Exam: Vitals:   01/31/18 1555 01/31/18 1745 01/31/18 2000  BP: (!) 161/95 (!) 126/99 (!) 142/79  Pulse: (!) 140 (!) 149   Resp: 18 15 16   Temp: 98.4 F (36.9 C)    TempSrc: Oral    SpO2: 95% 99%     Constitutional: NAD, calm, comfortable Eyes: PERRL, lids and conjunctivae normal ENMT: Mucous membranes are moist. Posterior pharynx clear of any exudate or lesions.Normal dentition.  Neck: normal, supple, no masses, no  thyromegaly Respiratory: clear to auscultation bilaterally, no wheezing, no crackles. Normal respiratory effort. No accessory muscle use.  Cardiovascular: IRR, IRR no murmurs / rubs / gallops. 1+ BLE pitting edema. 2+ pedal pulses. No carotid bruits.  Abdomen: no tenderness, no masses palpated. No hepatosplenomegaly. Bowel sounds positive.  Musculoskeletal: no clubbing / cyanosis. No joint deformity upper and lower extremities. Good ROM, no contractures. Normal muscle tone.  Skin: no rashes, lesions, ulcers. No induration Neurologic: CN 2-12 grossly intact. Sensation intact, DTR normal. Strength 5/5 in all 4.  Psychiatric: Normal judgment and insight. Alert and oriented x 3. Normal mood.    Labs on  Admission: I have personally reviewed following labs and imaging studies  CBC: Recent Labs  Lab 01/31/18 1629  WBC 7.3  NEUTROABS 6.2  HGB 10.5*  HCT 34.6*  MCV 85.0  PLT 253*   Basic Metabolic Panel: Recent Labs  Lab 01/31/18 1629  NA 136  K 3.8  CL 98*  CO2 28  GLUCOSE 145*  BUN 17  CREATININE 0.95  CALCIUM 8.2*  MG 1.5*   GFR: CrCl cannot be calculated (Unknown ideal weight.). Liver Function Tests: Recent Labs  Lab 01/31/18 1629  AST 13*  ALT 11*  ALKPHOS 89  BILITOT 0.8  PROT 7.0  ALBUMIN 2.9*   Recent Labs  Lab 01/31/18 1629  LIPASE 24   No results for input(s): AMMONIA in the last 168 hours. Coagulation Profile: No results for input(s): INR, PROTIME in the last 168 hours. Cardiac Enzymes: No results for input(s): CKTOTAL, CKMB, CKMBINDEX, TROPONINI in the last 168 hours. BNP (last 3 results) No results for input(s): PROBNP in the last 8760 hours. HbA1C: No results for input(s): HGBA1C in the last 72 hours. CBG: No results for input(s): GLUCAP in the last 168 hours. Lipid Profile: No results for input(s): CHOL, HDL, LDLCALC, TRIG, CHOLHDL, LDLDIRECT in the last 72 hours. Thyroid Function Tests: No results for input(s): TSH, T4TOTAL, FREET4, T3FREE,  THYROIDAB in the last 72 hours. Anemia Panel: No results for input(s): VITAMINB12, FOLATE, FERRITIN, TIBC, IRON, RETICCTPCT in the last 72 hours. Urine analysis:    Component Value Date/Time   COLORURINE YELLOW 01/31/2018 1823   APPEARANCEUR CLOUDY (A) 01/31/2018 1823   LABSPEC 1.014 01/31/2018 1823   PHURINE 5.0 01/31/2018 1823   GLUCOSEU NEGATIVE 01/31/2018 1823   HGBUR LARGE (A) 01/31/2018 1823   BILIRUBINUR NEGATIVE 01/31/2018 1823   KETONESUR NEGATIVE 01/31/2018 1823   PROTEINUR 100 (A) 01/31/2018 1823   UROBILINOGEN 0.2 11/08/2014 1942   NITRITE POSITIVE (A) 01/31/2018 1823   LEUKOCYTESUR LARGE (A) 01/31/2018 1823    Radiological Exams on Admission: Dg Chest 2 View  Result Date: 01/31/2018 CLINICAL DATA:  Tachycardia EXAM: CHEST  2 VIEW COMPARISON:  08/27/2017 FINDINGS: Normal heart size and mediastinal contours. Low volume chest with elevated right diaphragm. Mild atelectatic type opacity at the right base. No focal airspace disease based on the lateral view and preceding abdominal CT. No effusion or edema. Exaggerated thoracic kyphosis. IMPRESSION: Low volume chest with mild atelectasis. Electronically Signed   By: Monte Fantasia M.D.   On: 01/31/2018 17:45   Ct Renal Stone Study  Result Date: 01/31/2018 CLINICAL DATA:  Flank pain. EXAM: CT ABDOMEN AND PELVIS WITHOUT CONTRAST TECHNIQUE: Multidetector CT imaging of the abdomen and pelvis was performed following the standard protocol without IV contrast. COMPARISON:  CT abdomen pelvis dated November 16, 2017. FINDINGS: Lower chest: No acute abnormality. Hepatobiliary: Diffuse hepatic steatosis. Mildly distended gallbladder containing small gallstones. No gallbladder wall thickening or biliary dilatation. Pancreas: Unremarkable. No pancreatic ductal dilatation or surrounding inflammatory changes. Spleen: Normal in size without focal abnormality. Adrenals/Urinary Tract: The adrenal glands are unremarkable. Grossly unchanged large 2.4  cm calculus in the right renal pelvis. Unchanged small calculi in the right renal pyramids. Slightly more prominent perinephric and periureteral inflammatory changes. The previously seen 8 mm calculus in the lower pole of the left kidney is now in the renal pelvis, along with an adjacent 5 mm calculus. No ureteral calculi or hydronephrosis. The bladder is decompressed. Stomach/Bowel: Stomach is within normal limits. Appendix appears normal. No evidence of bowel wall thickening,  distention, or inflammatory changes. Vascular/Lymphatic: Aortic atherosclerosis. Prominent subcentimeter retroperitoneal lymph nodes are not enlarged by CT size criteria and likely reactive. Reproductive: Status post hysterectomy. No adnexal masses. Other: Small fat containing umbilical hernia. No free fluid or pneumoperitoneum. Musculoskeletal: No acute or significant osseous findings. IMPRESSION: 1. Unchanged large 2.4 cm calculus in the right renal pelvis, with additional smaller calculi in the right renal pyramids. Slightly more prominent right perinephric and periureteral inflammatory changes. Correlate with urinalysis 2. Two calculi measuring up to 8 mm in the left renal pelvis. 3. No ureteral calculi or hydronephrosis. 4. Diffuse hepatic steatosis. 5. Cholelithiasis. 6.  Aortic atherosclerosis (ICD10-I70.0). Electronically Signed   By: Titus Dubin M.D.   On: 01/31/2018 17:10    EKG: Independently reviewed.  Assessment/Plan Principal Problem:   Atrial fibrillation with RVR (HCC) Active Problems:   Diabetes mellitus (HCC)   HTN (hypertension)   Systolic and diastolic CHF, chronic (HCC)   Acute pyelonephritis    1. A.Fib RVR - 1. A.Fib pathway 2. cardizem gtt 3. Continue PO metoprolol BID 4. Continue home PO cardizem tomorrow AM 5. Wean gtt as able 6. Not on anticoagulation due to prior GIB 7. Tele monitor 2. DM2 - Continue metformin 3. HTN - continue home meds 4. Chronic CHF - will continue to hold lasix for  the moment since it doesn't appear to be acute. 5. Acute pyelonephritis - 1. Rocephin 2. Cultures pending  DVT prophylaxis: SCDs Code Status: Full Family Communication: Husband at bedside Disposition Plan: Home after admit Consults called: None Admission status: Place in obs   [place], [facility] Pager [phone]  If 7AM-7PM, please contact day team taking care of patient [email or web address] Password [facility]  01/31/2018, 8:28 PM

## 2018-01-31 NOTE — ED Notes (Signed)
Family at bedside. 

## 2018-02-01 DIAGNOSIS — Z882 Allergy status to sulfonamides status: Secondary | ICD-10-CM | POA: Diagnosis not present

## 2018-02-01 DIAGNOSIS — R109 Unspecified abdominal pain: Secondary | ICD-10-CM | POA: Diagnosis present

## 2018-02-01 DIAGNOSIS — Z8542 Personal history of malignant neoplasm of other parts of uterus: Secondary | ICD-10-CM | POA: Diagnosis not present

## 2018-02-01 DIAGNOSIS — M549 Dorsalgia, unspecified: Secondary | ICD-10-CM | POA: Diagnosis present

## 2018-02-01 DIAGNOSIS — Z79890 Hormone replacement therapy (postmenopausal): Secondary | ICD-10-CM | POA: Diagnosis not present

## 2018-02-01 DIAGNOSIS — Z87891 Personal history of nicotine dependence: Secondary | ICD-10-CM | POA: Diagnosis not present

## 2018-02-01 DIAGNOSIS — J9622 Acute and chronic respiratory failure with hypercapnia: Secondary | ICD-10-CM | POA: Diagnosis present

## 2018-02-01 DIAGNOSIS — E119 Type 2 diabetes mellitus without complications: Secondary | ICD-10-CM | POA: Diagnosis present

## 2018-02-01 DIAGNOSIS — I2510 Atherosclerotic heart disease of native coronary artery without angina pectoris: Secondary | ICD-10-CM | POA: Diagnosis present

## 2018-02-01 DIAGNOSIS — E039 Hypothyroidism, unspecified: Secondary | ICD-10-CM | POA: Diagnosis present

## 2018-02-01 DIAGNOSIS — I4891 Unspecified atrial fibrillation: Secondary | ICD-10-CM | POA: Diagnosis present

## 2018-02-01 DIAGNOSIS — I5042 Chronic combined systolic (congestive) and diastolic (congestive) heart failure: Secondary | ICD-10-CM | POA: Diagnosis present

## 2018-02-01 DIAGNOSIS — G2581 Restless legs syndrome: Secondary | ICD-10-CM | POA: Diagnosis present

## 2018-02-01 DIAGNOSIS — E662 Morbid (severe) obesity with alveolar hypoventilation: Secondary | ICD-10-CM | POA: Diagnosis present

## 2018-02-01 DIAGNOSIS — Z886 Allergy status to analgesic agent status: Secondary | ICD-10-CM | POA: Diagnosis not present

## 2018-02-01 DIAGNOSIS — Z23 Encounter for immunization: Secondary | ICD-10-CM | POA: Diagnosis present

## 2018-02-01 DIAGNOSIS — Z881 Allergy status to other antibiotic agents status: Secondary | ICD-10-CM | POA: Diagnosis not present

## 2018-02-01 DIAGNOSIS — Z6841 Body Mass Index (BMI) 40.0 and over, adult: Secondary | ICD-10-CM | POA: Diagnosis not present

## 2018-02-01 DIAGNOSIS — G8929 Other chronic pain: Secondary | ICD-10-CM | POA: Diagnosis present

## 2018-02-01 DIAGNOSIS — Z809 Family history of malignant neoplasm, unspecified: Secondary | ICD-10-CM | POA: Diagnosis not present

## 2018-02-01 DIAGNOSIS — N10 Acute tubulo-interstitial nephritis: Secondary | ICD-10-CM | POA: Diagnosis present

## 2018-02-01 DIAGNOSIS — J449 Chronic obstructive pulmonary disease, unspecified: Secondary | ICD-10-CM | POA: Diagnosis present

## 2018-02-01 DIAGNOSIS — I110 Hypertensive heart disease with heart failure: Secondary | ICD-10-CM | POA: Diagnosis present

## 2018-02-01 DIAGNOSIS — K219 Gastro-esophageal reflux disease without esophagitis: Secondary | ICD-10-CM | POA: Diagnosis present

## 2018-02-01 DIAGNOSIS — Z90710 Acquired absence of both cervix and uterus: Secondary | ICD-10-CM | POA: Diagnosis not present

## 2018-02-01 DIAGNOSIS — I480 Paroxysmal atrial fibrillation: Secondary | ICD-10-CM | POA: Diagnosis present

## 2018-02-01 MED ORDER — INSULIN ASPART 100 UNIT/ML ~~LOC~~ SOLN
0.0000 [IU] | Freq: Three times a day (TID) | SUBCUTANEOUS | Status: DC
  Administered 2018-02-02: 1 [IU] via SUBCUTANEOUS
  Administered 2018-02-02: 2 [IU] via SUBCUTANEOUS
  Administered 2018-02-03 (×2): 1 [IU] via SUBCUTANEOUS
  Administered 2018-02-03: 2 [IU] via SUBCUTANEOUS

## 2018-02-01 MED ORDER — DILTIAZEM HCL 60 MG PO TABS
60.0000 mg | ORAL_TABLET | Freq: Four times a day (QID) | ORAL | Status: DC
  Administered 2018-02-01 – 2018-02-03 (×10): 60 mg via ORAL
  Filled 2018-02-01 (×10): qty 1

## 2018-02-01 NOTE — Care Management Obs Status (Signed)
[name] NOTIFICATION   Patient Details  Name: [name] MRN: [id number] Date of Birth: 08/09/1956   Medicare Observation Status Notification Given:  Yes    MahabirJuliann Pulse, RN 02/01/2018, 1:28 PM

## 2018-02-01 NOTE — Progress Notes (Signed)
Patient Demographics:    [name], is a 62 y.o. female, DOB - [birth year], NUU:[id number]  Admit date - 01/31/2018   Admitting Physician Etta Quill, DO  Outpatient Primary MD for the patient is Dixie Dials, MD  LOS - 0   Chief Complaint  Patient presents with  . Flank Pain        Subjective:    Malena Peer today has no fevers, no emesis,  No chest pain,shortness  of breath is better dysuria is better  Assessment  & Plan :    Principal Problem:   Atrial fibrillation with RVR (HCC) Active Problems:   Diabetes mellitus (HCC)   HTN (hypertension)   Systolic and diastolic CHF, chronic (HCC)   Acute pyelonephritis   A-fib (HCC)  [name] is a 62 y.o. female with medical history significant of OSA, CHF, paroxysmal atrial fibrillation not on blood thinners due to prior GI bleed, diabetes, and known kidney stones who presents with fatigue, malaise, bilateral flank pain, and urinary urgency and frequency admitted on 01/31/18 with concerns about A. fib with RVR and UTI with possible pyelonephritis   1)UTI/possible pyelonephritis-continue IV Rocephin pending cultures  2)Afib with RVR-rate controlled better, transition to p.o. Cardizem, wean off IV Cardizem, continue metoprolol 50 mg twice daily no anticoagulation due to prior GI bleed  3)DM-restart metformin and use sliding scale insulin  4)HFpEF-last known EF 55-60%, patient with history of chronic diastolic dysfunction CHF, no acute exacerbation at this time, be judicious with IV fluids  5)Hypothyroidism-continue levothyroxine, recheck TSH  Code Status : Full    Disposition Plan  : home   DVT Prophylaxis  :    SCDs   Lab Results  Component Value Date   PLT 149 (L) 01/31/2018    Inpatient Medications  Scheduled Meds: . buprenorphine-naloxone  1 tablet Sublingual BID  . chlorhexidine  15 mL Mouth Rinse BID  . diltiazem   60 mg Oral Q6H  . famotidine  20 mg Oral BID  . gabapentin  100 mg Oral BID  . levothyroxine  75 mcg Oral QAC breakfast  . mouth rinse  15 mL Mouth Rinse q12n4p  . metFORMIN  500 mg Oral BID WC  . metoprolol tartrate  50 mg Oral BID  . sertraline  100 mg Oral Daily   Continuous Infusions: . cefTRIAXone (ROCEPHIN)  IV Stopped (01/31/18 2207)   PRN Meds:.acetaminophen, ketotifen, levalbuterol, ondansetron (ZOFRAN) IV    Anti-infectives (From admission, onward)   Start     Dose/Rate Route Frequency Ordered Stop   01/31/18 2200  cefTRIAXone (ROCEPHIN) 1 g in sodium chloride 0.9 % 100 mL IVPB     1 g 200 mL/hr over 30 Minutes Intravenous Every 24 hours 01/31/18 2011     01/31/18 2000  cefTRIAXone (ROCEPHIN) 1 g in sodium chloride 0.9 % 100 mL IVPB  Status:  Discontinued     1 g 200 mL/hr over 30 Minutes Intravenous  Once 01/31/18 1953 01/31/18 2117        Objective:   Vitals:   02/01/18 0700 02/01/18 0800 02/01/18 0900 02/01/18 1200  BP: 117/77 130/67 115/74   Pulse: 82 94    Resp: (!) 25 20 18    Temp:  (!) 97.5 F (  36.4 C)  99 F (37.2 C)  TempSrc:  Oral  Oral  SpO2: 100% 97% 98%   Weight:      Height:        Wt Readings from Last 3 Encounters:  01/31/18 134.2 kg (295 lb 13.7 oz)  10/23/17 131.3 kg (289 lb 6.4 oz)  09/18/17 133.9 kg (295 lb 3.2 oz)     Intake/Output Summary (Last 24 hours) at 02/01/2018 1446 Last data filed at 02/01/2018 0800 Gross per 24 hour  Intake 235.42 ml  Output 350 ml  Net -114.58 ml     Physical Exam  Gen:- Awake Alert,  obese HEENT:- [place].AT, No sclera icterus Neck-Supple Neck,No JVD,.  Lungs-  CTAB  CV- S1, S2 normal, irregularly irregular Abd-  +ve B.Sounds, Abd Soft, No tenderness, no CVA tenderness, increased truncal adiposity Extremity/Skin:- trace  edema,    Psych-affect is appropriate Neuro-no new focal deficits   Data Review:   Micro Results Recent Results (from the past 240 hour(s))  MRSA PCR Screening     Status:  None   Collection Time: 01/31/18  9:23 PM  Result Value Ref Range Status   MRSA by PCR NEGATIVE NEGATIVE Final    Comment:        The GeneXpert MRSA Assay (FDA approved for NASAL specimens only), is one component of a comprehensive MRSA colonization surveillance program. It is not intended to diagnose MRSA infection nor to guide or monitor treatment for MRSA infections. Performed at [facility], [place] [address], [place], [place] [address]     Radiology Reports Dg Chest 2 View  Result Date: 01/31/2018 CLINICAL DATA:  Tachycardia EXAM: CHEST  2 VIEW COMPARISON:  08/27/2017 FINDINGS: Normal heart size and mediastinal contours. Low volume chest with elevated right diaphragm. Mild atelectatic type opacity at the right base. No focal airspace disease based on the lateral view and preceding abdominal CT. No effusion or edema. Exaggerated thoracic kyphosis. IMPRESSION: Low volume chest with mild atelectasis. Electronically Signed   By: Monte Fantasia M.D.   On: 01/31/2018 17:45   Ct Renal Stone Study  Result Date: 01/31/2018 CLINICAL DATA:  Flank pain. EXAM: CT ABDOMEN AND PELVIS WITHOUT CONTRAST TECHNIQUE: Multidetector CT imaging of the abdomen and pelvis was performed following the standard protocol without IV contrast. COMPARISON:  CT abdomen pelvis dated November 16, 2017. FINDINGS: Lower chest: No acute abnormality. Hepatobiliary: Diffuse hepatic steatosis. Mildly distended gallbladder containing small gallstones. No gallbladder wall thickening or biliary dilatation. Pancreas: Unremarkable. No pancreatic ductal dilatation or surrounding inflammatory changes. Spleen: Normal in size without focal abnormality. Adrenals/Urinary Tract: The adrenal glands are unremarkable. Grossly unchanged large 2.4 cm calculus in the right renal pelvis. Unchanged small calculi in the right renal pyramids. Slightly more prominent perinephric and periureteral inflammatory changes. The previously  seen 8 mm calculus in the lower pole of the left kidney is now in the renal pelvis, along with an adjacent 5 mm calculus. No ureteral calculi or hydronephrosis. The bladder is decompressed. Stomach/Bowel: Stomach is within normal limits. Appendix appears normal. No evidence of bowel wall thickening, distention, or inflammatory changes. Vascular/Lymphatic: Aortic atherosclerosis. Prominent subcentimeter retroperitoneal lymph nodes are not enlarged by CT size criteria and likely reactive. Reproductive: Status post hysterectomy. No adnexal masses. Other: Small fat containing umbilical hernia. No free fluid or pneumoperitoneum. Musculoskeletal: No acute or significant osseous findings. IMPRESSION: 1. Unchanged large 2.4 cm calculus in the right renal pelvis, with additional smaller calculi in the right renal pyramids. Slightly more  prominent right perinephric and periureteral inflammatory changes. Correlate with urinalysis 2. Two calculi measuring up to 8 mm in the left renal pelvis. 3. No ureteral calculi or hydronephrosis. 4. Diffuse hepatic steatosis. 5. Cholelithiasis. 6.  Aortic atherosclerosis (ICD10-I70.0). Electronically Signed   By: Titus Dubin M.D.   On: 01/31/2018 17:10     CBC Recent Labs  Lab 01/31/18 1629  WBC 7.3  HGB 10.5*  HCT 34.6*  PLT 149*  MCV 85.0  MCH 25.8*  MCHC 30.3  RDW 16.1*  LYMPHSABS 0.6*  MONOABS 0.5  EOSABS 0.0  BASOSABS 0.0    Chemistries  Recent Labs  Lab 01/31/18 1629  NA 136  K 3.8  CL 98*  CO2 28  GLUCOSE 145*  BUN 17  CREATININE 0.95  CALCIUM 8.2*  MG 1.5*  AST 13*  ALT 11*  ALKPHOS 89  BILITOT 0.8   ------------------------------------------------------------------------------------------------------------------ No results for input(s): CHOL, HDL, LDLCALC, TRIG, CHOLHDL, LDLDIRECT in the last 72 hours.  Lab Results  Component Value Date   HGBA1C 6.7 (H) 07/02/2017    ------------------------------------------------------------------------------------------------------------------ No results for input(s): TSH, T4TOTAL, T3FREE, THYROIDAB in the last 72 hours.  Invalid input(s): FREET3 ------------------------------------------------------------------------------------------------------------------ No results for input(s): VITAMINB12, FOLATE, FERRITIN, TIBC, IRON, RETICCTPCT in the last 72 hours.  Coagulation profile No results for input(s): INR, PROTIME in the last 168 hours.  No results for input(s): DDIMER in the last 72 hours.  Cardiac Enzymes No results for input(s): CKMB, TROPONINI, MYOGLOBIN in the last 168 hours.  Invalid input(s): CK ------------------------------------------------------------------------------------------------------------------    Component Value Date/Time   BNP 216.9 (H) 01/31/2018 2129     Roxan Hockey M.D on 02/01/2018 at 2:46 PM  Between 7am to 7pm - Pager - [phone]  After 7pm go to [email or web address] - password [email or web address]  [facility] -  Office  [phone]   Voice Recognition Viviann Spare dictation system was used to create this note, attempts have been made to correct errors. Please contact the author with questions and/or clarifications.

## 2018-02-01 NOTE — Progress Notes (Signed)
Initial Nutrition Assessment  DOCUMENTATION CODES:   Morbid obesity  INTERVENTION:   - Downgraded pt's diet to Soft Heart Healthy/Carb Modified - Encouraged PO intake  NUTRITION DIAGNOSIS:   Inadequate oral intake related to nausea, poor appetite as evidenced by per patient/family report.  GOAL:   Patient will meet greater than or equal to 90% of their needs  MONITOR:   PO intake, Labs, Weight trends  REASON FOR ASSESSMENT:   Malnutrition Screening Tool   ASSESSMENT:   62 year old pt who presented to ED for fatigue, malaise, bilateral flank pain, and urinary urgency and frequency. PMH significant for CHF, CAD, OSA, depression, DM type 2, GERD, HTN, hypothyroidism, and known kidney stones.  Pt states that she has difficulty chewing due to lack of teeth and would prefer to receive meals that are soft and easy to chew. RD will downgrade pt's diet to Soft Heart Healthy/Carb Modified.  Spoke with pt at bedside who reports she is feeling much better and that her appetite has returned. Pt states that for 1 week PTA, she was only able to tolerate fluids due to nausea and consumed Coke, water, and orange juice. Pt states that she has had a decreased appetite for about 1 month PTA that would include one meal daily and only "picked at food." Prior to this, pt had a good appetite and would eat 1 large meal and several small meals daily. A typical daily may include toast for breakfast, crackers and cookies throughout the day, and a mobile meal for dinner.  Pt states she was able to eat a few bites of everything for breakfast this morning. Pt endorses continued nausea but states that it is much improved.  Pt reports UBW as 291 lbs and that she intentionally lost weight to 280 lbs. Pt is unsure of her current weight. Pt states that weight fluctuates due to swelling in her legs. Per weight history in chart, pt's weight has fluctuated between 281-302 lbs since January 2017.  Medications  reviewed and include: 20 mg Pepcid BID, 75 mcg levothyroxine daily, 500 mg Metformin BID with meals  Labs reviewed: BNP 216.9 (H), potassium 98 (L), calcium 8.2 (L), magnesium 1.5 (L), AST 13 (L), ALT 11 (L)  NUTRITION - FOCUSED PHYSICAL EXAM:    Most Recent Value  Orbital Region  No depletion  Upper Arm Region  No depletion  Thoracic and Lumbar Region  No depletion  Buccal Region  No depletion  Temple Region  No depletion  Clavicle Bone Region  No depletion  Clavicle and Acromion Bone Region  No depletion  Scapular Bone Region  No depletion  Dorsal Hand  No depletion  Patellar Region  No depletion  Anterior Thigh Region  No depletion  Posterior Calf Region  No depletion  Edema (RD Assessment)  Mild  Hair  Reviewed  Eyes  Reviewed  Mouth  Reviewed  Skin  Reviewed  Nails  Reviewed       Diet Order:  DIET SOFT Room service appropriate? Yes; Fluid consistency: Thin  EDUCATION NEEDS:   No education needs have been identified at this time  Skin:  Skin Assessment: Reviewed RN Assessment  Last BM:  Unknown  Height:   Ht Readings from Last 1 Encounters:  01/31/18 5\' 5"  (1.651 m)    Weight:   Wt Readings from Last 1 Encounters:  01/31/18 295 lb 13.7 oz (134.2 kg)    Ideal Body Weight:  56.8 kg  BMI:  Body mass index is 49.23 kg/m.  Estimated Nutritional Needs:   Kcal:  1900-2100 kcal/day  Protein:  70-85 grams/day  Fluid:  1.9-2.1 L/day    Gaynell Face, MS, RD, LDN Pager: [phone] Weekend/After Hours: [phone]

## 2018-02-02 LAB — BASIC METABOLIC PANEL
ANION GAP: 10 (ref 5–15)
BUN: 20 mg/dL (ref 6–20)
CHLORIDE: 98 mmol/L — AB (ref 101–111)
CO2: 31 mmol/L (ref 22–32)
Calcium: 8.2 mg/dL — ABNORMAL LOW (ref 8.9–10.3)
Creatinine, Ser: 1.05 mg/dL — ABNORMAL HIGH (ref 0.44–1.00)
GFR calc Af Amer: >60 mL/min (ref >60)
GFR calc non Af Amer: 55 mL/min — ABNORMAL LOW (ref >60)
Glucose, Bld: 141 mg/dL — ABNORMAL HIGH (ref 65–99)
POTASSIUM: 3.7 mmol/L (ref 3.5–5.1)
SODIUM: 139 mmol/L (ref 135–145)

## 2018-02-02 LAB — TSH: TSH: 1.412 u[IU]/mL (ref 0.350–4.500)

## 2018-02-02 LAB — GLUCOSE, CAPILLARY
GLUCOSE-CAPILLARY: 144 mg/dL — AB (ref 65–99)
GLUCOSE-CAPILLARY: 154 mg/dL — AB (ref 65–99)
Glucose-Capillary: 112 mg/dL — ABNORMAL HIGH (ref 65–99)
Glucose-Capillary: 159 mg/dL — ABNORMAL HIGH (ref 65–99)

## 2018-02-02 NOTE — Progress Notes (Signed)
Patient Demographics:    [name], is a 62 y.o. female, DOB - 1956-06-17, JJO:[id number]  Admit date - 01/31/2018   Admitting Physician Etta Quill, DO  Outpatient Primary MD for the patient is Dixie Dials, MD  LOS - 1   Chief Complaint  Patient presents with  . Flank Pain        Subjective:    [name] today has no fevers, no emesis,  No chest pain, no dysuria, shortness of breath is better,  Assessment  & Plan :    Principal Problem:   Atrial fibrillation with RVR (HCC) Active Problems:   Diabetes mellitus (HCC)   HTN (hypertension)   Systolic and diastolic CHF, chronic (HCC)   Acute pyelonephritis   A-fib (HCC)  Brief summary:- [name] is a 62 y.o. female with medical history significant of OSA, CHF, paroxysmal atrial fibrillation not on blood thinners due to prior GI bleed, diabetes, and known kidney stones who presents with fatigue, malaise, bilateral flank pain, and urinary urgency and frequency admitted on 01/31/18 with concerns about A. fib with RVR and UTI with possible pyelonephritis  Plan:- 1)E coli UTI/possible pyelonephritis-improving clinically, no further dysuria continue IV Rocephin pending E. coli sensitivities urine culture  2)Afib with RVR-overall improved, continue Cardizem p.o. for rate control along with  metoprolol 50 mg twice daily,  no anticoagulation due to prior GI bleed  3)DM- c/n metformin and use sliding scale insulin  4)HFpEF-last known EF 55-60%, patient with history of chronic diastolic dysfunction CHF, no acute exacerbation at this time, be judicious with IV fluids  5)Hypothyroidism-continue levothyroxine,  TSH is 1.4  Code Status : Full    Disposition Plan  : home   DVT Prophylaxis  :    SCDs   Lab Results  Component Value Date   PLT 149 (L) 01/31/2018    Inpatient Medications  Scheduled Meds: . buprenorphine-naloxone   1 tablet Sublingual BID  . chlorhexidine  15 mL Mouth Rinse BID  . diltiazem  60 mg Oral Q6H  . famotidine  20 mg Oral BID  . gabapentin  100 mg Oral BID  . insulin aspart  0-9 Units Subcutaneous TID WC  . levothyroxine  75 mcg Oral QAC breakfast  . mouth rinse  15 mL Mouth Rinse q12n4p  . metFORMIN  500 mg Oral BID WC  . metoprolol tartrate  50 mg Oral BID  . sertraline  100 mg Oral Daily   Continuous Infusions: . cefTRIAXone (ROCEPHIN)  IV Stopped (02/02/18 0008)   PRN Meds:.acetaminophen, ketotifen, levalbuterol, ondansetron (ZOFRAN) IV    Anti-infectives (From admission, onward)   Start     Dose/Rate Route Frequency Ordered Stop   01/31/18 2200  cefTRIAXone (ROCEPHIN) 1 g in sodium chloride 0.9 % 100 mL IVPB     1 g 200 mL/hr over 30 Minutes Intravenous Every 24 hours 01/31/18 2011     01/31/18 2000  cefTRIAXone (ROCEPHIN) 1 g in sodium chloride 0.9 % 100 mL IVPB  Status:  Discontinued     1 g 200 mL/hr over 30 Minutes Intravenous  Once 01/31/18 1953 01/31/18 2117        Objective:   Vitals:   02/01/18 2100 02/01/18 2210 02/02/18 0426 02/02/18 1435  BP: (!) 134/112 136/74 126/63 121/64  Pulse: 87 (!) 103 91 90  Resp: 15 18 19 19   Temp:  98 F (36.7 C) 98.3 F (36.8 C) 98.3 F (36.8 C)  TempSrc:  Oral Oral Oral  SpO2: 99% 98% 97% 95%  Weight:      Height:        Wt Readings from Last 3 Encounters:  01/31/18 134.2 kg (295 lb 13.7 oz)  10/23/17 131.3 kg (289 lb 6.4 oz)  09/18/17 133.9 kg (295 lb 3.2 oz)     Intake/Output Summary (Last 24 hours) at 02/02/2018 1740 Last data filed at 02/02/2018 1435 Gross per 24 hour  Intake 240 ml  Output 1150 ml  Net -910 ml     Physical Exam  Gen:- Awake Alert,  obese HEENT:- [place].AT, No sclera icterus Neck-Supple Neck,No JVD,.  Lungs-  CTAB  CV- S1, S2 normal, irregularly irregular Abd-  +ve B.Sounds, Abd Soft, No tenderness, no CVA tenderness, increased truncal adiposity Extremity/Skin:- trace  edema,      Psych-affect is appropriate Neuro-no new focal deficits   Data Review:   Micro Results Recent Results (from the past 240 hour(s))  Urine culture     Status: Abnormal (Preliminary result)   Collection Time: 01/31/18  6:23 PM  Result Value Ref Range Status   Specimen Description   Final    URINE, RANDOM Performed at [facility] [address], [place], [place] [address]    Special Requests   Final    NONE Performed at [facility], [place] [address] [place], [place] [address]    Culture >=100,000 COLONIES/mL ESCHERICHIA COLI (A)  Final   Report Status PENDING  Incomplete  MRSA PCR Screening     Status: None   Collection Time: 01/31/18  9:23 PM  Result Value Ref Range Status   MRSA by PCR NEGATIVE NEGATIVE Final    Comment:        The GeneXpert MRSA Assay (FDA approved for NASAL specimens only), is one component of a comprehensive MRSA colonization surveillance program. It is not intended to diagnose MRSA infection nor to guide or monitor treatment for MRSA infections. Performed at [facility], [place] [address] [place], [place] [address]   Blood culture (routine x 2)     Status: None (Preliminary result)   Collection Time: 01/31/18  9:29 PM  Result Value Ref Range Status   Specimen Description   Final    BLOOD LEFT HAND Performed at [facility] [address], [place], [place] [address]    Special Requests   Final    IN PEDIATRIC BOTTLE Blood Culture adequate volume Performed at [facility] [address], [place], [place] [address]    Culture   Final    NO GROWTH 2 DAYS Performed at [facility] [address] [place], [place] [address]    Report Status PENDING  Incomplete  Blood culture (routine x 2)     Status: None (Preliminary result)   Collection Time: 01/31/18  9:29 PM  Result Value Ref Range Status   Specimen Description   Final    BLOOD RIGHT  ANTECUBITAL Performed at [facility] [address], [place], [place] [address]    Special Requests   Final    BOTTLES DRAWN AEROBIC AND ANAEROBIC Blood Culture adequate volume Performed at [facility] [address], [place], [place] [address]    Culture  Final    NO GROWTH 2 DAYS Performed at [facility], [place] [address], [place], [place] [address]    Report Status PENDING  Incomplete    Radiology Reports Dg Chest 2 View  Result Date: 01/31/2018 CLINICAL DATA:  Tachycardia EXAM: CHEST  2 VIEW COMPARISON:  08/27/2017 FINDINGS: Normal heart size and mediastinal contours. Low volume chest with elevated right diaphragm. Mild atelectatic type opacity at the right base. No focal airspace disease based on the lateral view and preceding abdominal CT. No effusion or edema. Exaggerated thoracic kyphosis. IMPRESSION: Low volume chest with mild atelectasis. Electronically Signed   By: Monte Fantasia M.D.   On: 01/31/2018 17:45   Ct Renal Stone Study  Result Date: 01/31/2018 CLINICAL DATA:  Flank pain. EXAM: CT ABDOMEN AND PELVIS WITHOUT CONTRAST TECHNIQUE: Multidetector CT imaging of the abdomen and pelvis was performed following the standard protocol without IV contrast. COMPARISON:  CT abdomen pelvis dated November 16, 2017. FINDINGS: Lower chest: No acute abnormality. Hepatobiliary: Diffuse hepatic steatosis. Mildly distended gallbladder containing small gallstones. No gallbladder wall thickening or biliary dilatation. Pancreas: Unremarkable. No pancreatic ductal dilatation or surrounding inflammatory changes. Spleen: Normal in size without focal abnormality. Adrenals/Urinary Tract: The adrenal glands are unremarkable. Grossly unchanged large 2.4 cm calculus in the right renal pelvis. Unchanged small calculi in the right renal pyramids. Slightly more prominent perinephric and periureteral inflammatory changes. The previously seen 8 mm calculus in the  lower pole of the left kidney is now in the renal pelvis, along with an adjacent 5 mm calculus. No ureteral calculi or hydronephrosis. The bladder is decompressed. Stomach/Bowel: Stomach is within normal limits. Appendix appears normal. No evidence of bowel wall thickening, distention, or inflammatory changes. Vascular/Lymphatic: Aortic atherosclerosis. Prominent subcentimeter retroperitoneal lymph nodes are not enlarged by CT size criteria and likely reactive. Reproductive: Status post hysterectomy. No adnexal masses. Other: Small fat containing umbilical hernia. No free fluid or pneumoperitoneum. Musculoskeletal: No acute or significant osseous findings. IMPRESSION: 1. Unchanged large 2.4 cm calculus in the right renal pelvis, with additional smaller calculi in the right renal pyramids. Slightly more prominent right perinephric and periureteral inflammatory changes. Correlate with urinalysis 2. Two calculi measuring up to 8 mm in the left renal pelvis. 3. No ureteral calculi or hydronephrosis. 4. Diffuse hepatic steatosis. 5. Cholelithiasis. 6.  Aortic atherosclerosis (ICD10-I70.0). Electronically Signed   By: Titus Dubin M.D.   On: 01/31/2018 17:10     CBC Recent Labs  Lab 01/31/18 1629  WBC 7.3  HGB 10.5*  HCT 34.6*  PLT 149*  MCV 85.0  MCH 25.8*  MCHC 30.3  RDW 16.1*  LYMPHSABS 0.6*  MONOABS 0.5  EOSABS 0.0  BASOSABS 0.0    Chemistries  Recent Labs  Lab 01/31/18 1629 02/02/18 0005  NA 136 139  K 3.8 3.7  CL 98* 98*  CO2 28 31  GLUCOSE 145* 141*  BUN 17 20  CREATININE 0.95 1.05*  CALCIUM 8.2* 8.2*  MG 1.5*  --   AST 13*  --   ALT 11*  --   ALKPHOS 89  --   BILITOT 0.8  --    ------------------------------------------------------------------------------------------------------------------ No results for input(s): CHOL, HDL, LDLCALC, TRIG, CHOLHDL, LDLDIRECT in the last 72 hours.  Lab Results  Component Value Date   HGBA1C 6.7 (H) 07/02/2017    ------------------------------------------------------------------------------------------------------------------ Recent Labs    02/02/18 0005  TSH 1.412   ------------------------------------------------------------------------------------------------------------------ No results for input(s): VITAMINB12, FOLATE, FERRITIN, TIBC, IRON, RETICCTPCT in the last 72  hours.  Coagulation profile No results for input(s): INR, PROTIME in the last 168 hours.  No results for input(s): DDIMER in the last 72 hours.  Cardiac Enzymes No results for input(s): CKMB, TROPONINI, MYOGLOBIN in the last 168 hours.  Invalid input(s): CK ------------------------------------------------------------------------------------------------------------------    Component Value Date/Time   BNP 216.9 (H) 01/31/2018 2129     [name] M.D on 02/02/2018 at 5:40 PM  Between 7am to 7pm - Pager - [phone]  After 7pm go to [email or web address] - password [email or web address]  [facility] -  Office  [phone]   Voice Recognition Viviann Spare dictation system was used to create this note, attempts have been made to correct errors. Please contact the author with questions and/or clarifications.

## 2018-02-03 LAB — GLUCOSE, CAPILLARY
Glucose-Capillary: 122 mg/dL — ABNORMAL HIGH (ref 65–99)
Glucose-Capillary: 136 mg/dL — ABNORMAL HIGH (ref 65–99)
Glucose-Capillary: 165 mg/dL — ABNORMAL HIGH (ref 65–99)

## 2018-02-03 LAB — URINE CULTURE

## 2018-02-03 MED ORDER — METFORMIN HCL 500 MG PO TABS: 500.0000 mg | ORAL_TABLET | Freq: Two times a day (BID) | ORAL | 2 refills | Status: AC

## 2018-02-03 MED ORDER — CIPROFLOXACIN HCL 500 MG PO TABS: 500.0000 mg | ORAL_TABLET | Freq: Two times a day (BID) | ORAL | 0 refills | Status: AC

## 2018-02-03 MED ORDER — METOPROLOL TARTRATE 50 MG PO TABS: 50.0000 mg | ORAL_TABLET | Freq: Two times a day (BID) | ORAL | 2 refills | Status: DC

## 2018-02-03 MED ORDER — FUROSEMIDE 20 MG PO TABS: 20.0000 mg | ORAL_TABLET | Freq: Two times a day (BID) | ORAL | 1 refills | Status: DC

## 2018-02-03 MED ORDER — CIPROFLOXACIN HCL 500 MG PO TABS
500.0000 mg | ORAL_TABLET | Freq: Once | ORAL | Status: AC
  Administered 2018-02-03: 500 mg via ORAL
  Filled 2018-02-03: qty 1

## 2018-02-03 MED ORDER — CIPROFLOXACIN HCL 500 MG PO TABS: 500.0000 mg | ORAL_TABLET | Freq: Once | ORAL | Status: DC

## 2018-02-03 MED ORDER — DILTIAZEM HCL ER 240 MG PO CP24: 240.0000 mg | ORAL_CAPSULE | Freq: Every day | ORAL | 2 refills | Status: DC

## 2018-02-03 NOTE — Progress Notes (Signed)
Patient given discharge, follow up, and medication instructions, verbalized understanding, IV and telemetry box removed, prescriptions and personal belongings with patient, states she is waiting on a friend/family member to pick her up.

## 2018-02-03 NOTE — Discharge Summary (Signed)
[name], is a 62 y.o. female  DOB 1956/04/09  MRN [id number].  Admission date:  01/31/2018  Admitting Physician  Etta Quill, DO  Discharge Date:  02/03/2018   Primary MD  Dixie Dials, MD  Recommendations for primary care physician for things to follow:   1)Take Ciprofloxacillin antibiotic 500 mg twice a day for 1 week for E. coli urinary Tract infection and Pyelonephritis 2)Take all other medications as prescribed 3)Low-salt diet advised 4)Follow-up with your regular doctor within a week for recheck   Admission Diagnosis  Pyelonephritis [N12] Atrial fibrillation with RVR ([place]) [I48.91]   Discharge Diagnosis  Pyelonephritis [N12] Atrial fibrillation with RVR ([place]) [I48.91]    Principal Problem:   Atrial fibrillation with RVR ([place]) Active Problems:   Diabetes mellitus ([place])   HTN (hypertension)   Systolic and diastolic CHF, chronic (HCC)   Acute pyelonephritis   A-fib ([place])      Past Medical History:  Diagnosis Date  . Acute and chronic respiratory failure with hypercapnia ([place])   . ADD (attention deficit disorder)   . Altered mental status    2nd to hypercapnea  . CHF (congestive heart failure) ([place])   . Chronic low back pain   . Complication of anesthesia    " I AM SLOW TO WAKE UP AT ITMES "  . Coronary artery disease   . Depression   . Diabetes mellitus   . Endometrial cancer ([place])   . GERD (gastroesophageal reflux disease)   . Hypercapnia   . Hypertension   . Hypothyroidism   . Hypoxemia   . Morbid obesity ([place])   . Nephrolithiasis   . OSA (obstructive sleep apnea)   . Paroxysmal atrial fibrillation (HCC)   . Restless leg syndrome     Past Surgical History:  Procedure Laterality Date  . BACK SURGERY     x2  . DENTAL SURGERY    . SHOULDER SURGERY     x2  . SP PERC NEPHROSTOMY    . TONSILLECTOMY    . VAGINAL HYSTERECTOMY     x2       HPI  from the  history and physical done on the day of admission:     Chief Complaint: Flank pain  HPI: [name] is a 62 y.o. female with medical history significant of OSA, CHF, paroxysmal atrial fibrillation not on blood thinners due to prior GI bleed, diabetes, and known kidney stones who presents with fatigue, malaise, bilateral flank pain, and urinary urgency and frequency.  Flank pain mild, onset 3 weeks ago, worsened over past week and now constant 5/10.  No N/V.  Decreased PO intake.  Stopped her lasix ~3 weeks ago since swelling in legs has been down, says leg swelling is less than usual at the moment still.   ED Course: A.Fib RVR with rate 140s.  CT shows renal pelvis stones but no obstruction, hydro.  Does appear to have mild pyelo.  UA positive for UTI.  Started on rocephin and cardizem gtt  ordered.    Hospital Course:    Brief summary:- Anna Marchese Hodginis a 62 y.o.femalewith medical history significant ofOSA, CHF,paroxysmal atrial fibrillation not on blood thinners due to prior GI bleed, diabetes, and known kidney stones who presents with fatigue, malaise, bilateral flank pain, and urinary urgency and frequency admitted on 01/31/18 with concerns about A. fib with RVR and UTI with possible pyelonephritis  Plan:- 1)E coli UTI/possible pyelonephritis- much improved, patient was treated with IV Rocephin, we discharged home on Cipro per sensitivity report  2)Afib with RVR-much improved, discharged home on Cardizem CD 240 mg daily and     metoprolol 50 mg twice daily for rate control,  no anticoagulation due to prior GI bleed  3)DM- c/n metformin   4)HFpEF-last known EF 55-60%, patient with history of chronic diastolic dysfunction CHF, no acute exacerbation at this time,   5)Hypothyroidism-continue levothyroxine,  TSH is 1.4  6) chronic hypoxic respiratory failure secondary to #4 above-currently at baseline, continue oxygen via nasal cannula 2 L/min  Discharge Condition:  stable  Follow UP--- PCP in 1 week   Diet and Activity recommendation:  As advised  Discharge Instructions    Discharge Instructions    ([place]) Call MD:  Anytime you have any of the following symptoms: 1) 3 pound weight gain in 24 hours or 5 pounds in 1 week 2) shortness of breath, with or without a dry hacking cough 3) swelling in the hands, feet or stomach 4) if you have to sleep on extra pillows at night in order to breathe.   Complete by:  As directed    Call MD for:  difficulty breathing, headache or visual disturbances   Complete by:  As directed    Call MD for:  persistant dizziness or light-headedness   Complete by:  As directed    Call MD for:  persistant nausea and vomiting   Complete by:  As directed    Call MD for:  temperature >100.4   Complete by:  As directed    Diet - low sodium heart healthy   Complete by:  As directed    Diet Carb Modified   Complete by:  As directed    Discharge instructions   Complete by:  As directed    1)Take Ciprofloxacillin antibiotic 500 mg twice a day for 1 week for E. coli urinary Tract infection and Pyelonephritis 2)Take all other medications as prescribed 3) low-salt diet advised 4) follow-up with your regular doctor within a week for recheck   Increase activity slowly   Complete by:  As directed        Discharge Medications     Allergies as of 02/03/2018      Reactions   Aspirin Other (See Comments)   Chest pain   Eliquis [apixaban]    Excessive bleeding   Nsaids Other (See Comments)   Chest pain   Other Other (See Comments)   "BP med" combined with plavix, caused syncope   Plavix [clopidogrel] Other (See Comments)   syncope   Sudafed [pseudoephedrine] Palpitations   Raised BP   Erythromycin Nausea And Vomiting   Sulfa Antibiotics Other (See Comments)   constipation   Sulfamethoxazole-trimethoprim Diarrhea   Altace [ramipril] Other (See Comments)   Severe fatigue   Zofran [ondansetron Hcl] Other  (See Comments)   "I put me into a coma"      Medication List    STOP taking these medications   clotrimazole-betamethasone cream Commonly known as:  LOTRISONE  TAKE these medications   buprenorphine 8 MG Subl SL tablet Commonly known as:  SUBUTEX Place 1 tablet (8 mg total) under the tongue 2 (two) times daily.   cetirizine 10 MG tablet Commonly known as:  ZYRTEC Take 10 mg by mouth at bedtime.   ciprofloxacin 500 MG tablet Commonly known as:  CIPRO Take 1 tablet (500 mg total) by mouth 2 (two) times daily for 7 days.   diltiazem 240 MG 24 hr capsule Commonly known as:  DILACOR XR Take 1 capsule (240 mg total) by mouth daily.   furosemide 20 MG tablet Commonly known as:  LASIX Take 1 tablet (20 mg total) by mouth 2 (two) times daily. What changed:    medication strength  how much to take   gabapentin 100 MG capsule Commonly known as:  NEURONTIN Take 1 capsule (100 mg total) by mouth 2 (two) times daily.   HAIR SKIN NAILS PO Take 1 tablet by mouth daily.   ketotifen 0.025 % ophthalmic solution Commonly known as:  ZADITOR Place 1 drop into both eyes 3 (three) times daily as needed.   levalbuterol 1.25 MG/0.5ML nebulizer solution Commonly known as:  XOPENEX Take 1.25 mg 3 (three) times daily as needed by nebulization for wheezing or shortness of breath.   metFORMIN 500 MG tablet Commonly known as:  GLUCOPHAGE Take 1 tablet (500 mg total) by mouth 2 (two) times daily with a meal. What changed:  when to take this   metoprolol tartrate 50 MG tablet Commonly known as:  LOPRESSOR Take 1 tablet (50 mg total) by mouth 2 (two) times daily.   potassium chloride 10 MEQ tablet Commonly known as:  K-DUR Take 1 tablet (10 mEq total) by mouth 2 (two) times daily.   prochlorperazine 10 MG tablet Commonly known as:  COMPAZINE Take 1 tablet (10 mg total) by mouth every 6 (six) hours as needed for nausea or vomiting.   ranitidine 150 MG tablet Commonly known as:   ZANTAC Take 150 mg 2 (two) times daily by mouth.   sertraline 100 MG tablet Commonly known as:  ZOLOFT Take 1 tablet (100 mg total) by mouth daily.   SYNTHROID 75 MCG tablet Generic drug:  levothyroxine Take 1 tablet (75 mcg total) by mouth daily before breakfast.   VITAMIN D PO Take 2,000 mg by mouth daily.       Major procedures and Radiology Reports - PLEASE review detailed and final reports for all details, in brief -   Dg Chest 2 View  Result Date: 01/31/2018 CLINICAL DATA:  Tachycardia EXAM: CHEST  2 VIEW COMPARISON:  08/27/2017 FINDINGS: Normal heart size and mediastinal contours. Low volume chest with elevated right diaphragm. Mild atelectatic type opacity at the right base. No focal airspace disease based on the lateral view and preceding abdominal CT. No effusion or edema. Exaggerated thoracic kyphosis. IMPRESSION: Low volume chest with mild atelectasis. Electronically Signed   By: Monte Fantasia M.D.   On: 01/31/2018 17:45   Ct Renal Stone Study  Result Date: 01/31/2018 CLINICAL DATA:  Flank pain. EXAM: CT ABDOMEN AND PELVIS WITHOUT CONTRAST TECHNIQUE: Multidetector CT imaging of the abdomen and pelvis was performed following the standard protocol without IV contrast. COMPARISON:  CT abdomen pelvis dated November 16, 2017. FINDINGS: Lower chest: No acute abnormality. Hepatobiliary: Diffuse hepatic steatosis. Mildly distended gallbladder containing small gallstones. No gallbladder wall thickening or biliary dilatation. Pancreas: Unremarkable. No pancreatic ductal dilatation or surrounding inflammatory changes. Spleen: Normal in size without focal abnormality.  Adrenals/Urinary Tract: The adrenal glands are unremarkable. Grossly unchanged large 2.4 cm calculus in the right renal pelvis. Unchanged small calculi in the right renal pyramids. Slightly more prominent perinephric and periureteral inflammatory changes. The previously seen 8 mm calculus in the lower pole of the left kidney  is now in the renal pelvis, along with an adjacent 5 mm calculus. No ureteral calculi or hydronephrosis. The bladder is decompressed. Stomach/Bowel: Stomach is within normal limits. Appendix appears normal. No evidence of bowel wall thickening, distention, or inflammatory changes. Vascular/Lymphatic: Aortic atherosclerosis. Prominent subcentimeter retroperitoneal lymph nodes are not enlarged by CT size criteria and likely reactive. Reproductive: Status post hysterectomy. No adnexal masses. Other: Small fat containing umbilical hernia. No free fluid or pneumoperitoneum. Musculoskeletal: No acute or significant osseous findings. IMPRESSION: 1. Unchanged large 2.4 cm calculus in the right renal pelvis, with additional smaller calculi in the right renal pyramids. Slightly more prominent right perinephric and periureteral inflammatory changes. Correlate with urinalysis 2. Two calculi measuring up to 8 mm in the left renal pelvis. 3. No ureteral calculi or hydronephrosis. 4. Diffuse hepatic steatosis. 5. Cholelithiasis. 6.  Aortic atherosclerosis (ICD10-I70.0). Electronically Signed   By: Titus Dubin M.D.   On: 01/31/2018 17:10    Micro Results   Recent Results (from the past 240 hour(s))  Urine culture     Status: Abnormal   Collection Time: 01/31/18  6:23 PM  Result Value Ref Range Status   Specimen Description   Final    URINE, RANDOM Performed at [facility] [address], [place], [place] [address]    Special Requests   Final    NONE Performed at [facility], [place] [address] [place], [place] [address]    Culture >=100,000 COLONIES/mL ESCHERICHIA COLI (A)  Final   Report Status 02/03/2018 FINAL  Final   Organism ID, Bacteria ESCHERICHIA COLI (A)  Final      Susceptibility   Escherichia coli - MIC*    AMPICILLIN 8 SENSITIVE Sensitive     CEFAZOLIN <=4 SENSITIVE Sensitive     CEFTRIAXONE <=1 SENSITIVE Sensitive     CIPROFLOXACIN <=0.25 SENSITIVE  Sensitive     GENTAMICIN <=1 SENSITIVE Sensitive     IMIPENEM <=0.25 SENSITIVE Sensitive     NITROFURANTOIN <=16 SENSITIVE Sensitive     TRIMETH/SULFA <=20 SENSITIVE Sensitive     AMPICILLIN/SULBACTAM <=2 SENSITIVE Sensitive     PIP/TAZO <=4 SENSITIVE Sensitive     Extended ESBL NEGATIVE Sensitive     * >=100,000 COLONIES/mL ESCHERICHIA COLI  MRSA PCR Screening     Status: None   Collection Time: 01/31/18  9:23 PM  Result Value Ref Range Status   MRSA by PCR NEGATIVE NEGATIVE Final    Comment:        The GeneXpert MRSA Assay (FDA approved for NASAL specimens only), is one component of a comprehensive MRSA colonization surveillance program. It is not intended to diagnose MRSA infection nor to guide or monitor treatment for MRSA infections. Performed at [facility], [place] [address], [place], [place] [address]   Blood culture (routine x 2)     Status: None (Preliminary result)   Collection Time: 01/31/18  9:29 PM  Result Value Ref Range Status   Specimen Description   Final    BLOOD LEFT HAND Performed at [facility] [address] [place],  [address]    Special Requests   Final    IN PEDIATRIC BOTTLE Blood Culture adequate  volume Performed at [facility], [place] [address], [place], [place] [address]    Culture   Final    NO GROWTH 2 DAYS Performed at [facility] [address], [place], [place] [address]    Report Status PENDING  Incomplete  Blood culture (routine x 2)     Status: None (Preliminary result)   Collection Time: 01/31/18  9:29 PM  Result Value Ref Range Status   Specimen Description   Final    BLOOD RIGHT ANTECUBITAL Performed at [facility] [address] [place], [place] [address]    Special Requests   Final    BOTTLES DRAWN AEROBIC AND ANAEROBIC Blood Culture adequate volume Performed at [facility] [address] [place], [place]  [address]    Culture   Final    NO GROWTH 2 DAYS Performed at [facility] [address], [place], [place] [address]    Report Status PENDING  Incomplete       Today   Subjective    [name] today has no new complaints, eating well, voiding well, no f/c, no cp,           Patient has been seen and examined prior to discharge   Objective   Blood pressure 130/76, pulse 94, temperature 98.8 F (37.1 C), temperature source Oral, resp. rate 16, height 5\' 5"  (1.651 m), weight 134.2 kg (295 lb 13.7 oz), SpO2 96 %.   Intake/Output Summary (Last 24 hours) at 02/03/2018 1519 Last data filed at 02/03/2018 0956 Gross per 24 hour  Intake 200 ml  Output 502 ml  Net -302 ml    Exam Gen:- Awake Alert,  obese Nose- [place] 2 L/min  HEENT:- [place].AT, No sclera icterus Neck-Supple Neck,No JVD,.  Lungs-  CTAB  CV- S1, S2 normal, irregularly irregular Abd-  +ve B.Sounds, Abd Soft, No tenderness, no CVA tenderness, increased truncal adiposity Extremity/Skin:- trace  edema,    Psych-affect is appropriate Neuro-no new focal deficits    Data Review   CBC w Diff:  Lab Results  Component Value Date   WBC 7.3 01/31/2018   HGB 10.5 (L) 01/31/2018   HCT 34.6 (L) 01/31/2018   PLT 149 (L) 01/31/2018   LYMPHOPCT 8 01/31/2018   MONOPCT 7 01/31/2018   EOSPCT 0 01/31/2018   BASOPCT 0 01/31/2018    CMP:  Lab Results  Component Value Date   NA 139 02/02/2018   K 3.7 02/02/2018   CL 98 (L) 02/02/2018   CO2 31 02/02/2018   BUN 20 02/02/2018   CREATININE 1.05 (H) 02/02/2018   PROT 7.0 01/31/2018   ALBUMIN 2.9 (L) 01/31/2018   BILITOT 0.8 01/31/2018   ALKPHOS 89 01/31/2018   AST 13 (L) 01/31/2018   ALT 11 (L) 01/31/2018  .   Total Discharge time is about 33 minutes  Roxan Hockey M.D on 02/03/2018 at 3:19 PM  [facility]   Office  [phone]  Voice Recognition Viviann Spare dictation system was used to create this note, attempts have been made to correct errors. Please  contact the author with questions and/or clarifications.

## 2018-02-05 LAB — CULTURE, BLOOD (ROUTINE X 2)
CULTURE: NO GROWTH
CULTURE: NO GROWTH
SPECIAL REQUESTS: ADEQUATE
Special Requests: ADEQUATE

## 2018-02-15 ENCOUNTER — Ambulatory Visit (HOSPITAL_BASED_OUTPATIENT_CLINIC_OR_DEPARTMENT_OTHER): Payer: Medicare Other | Attending: Adult Health

## 2018-02-15 ENCOUNTER — Encounter (HOSPITAL_BASED_OUTPATIENT_CLINIC_OR_DEPARTMENT_OTHER): Payer: Medicare Other

## 2018-02-25 ENCOUNTER — Other Ambulatory Visit: Payer: Self-pay | Admitting: Adult Health

## 2018-04-09 ENCOUNTER — Inpatient Hospital Stay (HOSPITAL_COMMUNITY)
Admission: EM | Admit: 2018-04-09 | Discharge: 2018-04-15 | DRG: 292 | Disposition: A | Discharge status: Home / Self Care | Payer: Medicare Other | Attending: Cardiovascular Disease | Admitting: Cardiovascular Disease

## 2018-04-09 ENCOUNTER — Encounter (HOSPITAL_COMMUNITY): Payer: Self-pay

## 2018-04-09 ENCOUNTER — Other Ambulatory Visit: Payer: Self-pay

## 2018-04-09 ENCOUNTER — Emergency Department (HOSPITAL_COMMUNITY): Payer: Medicare Other

## 2018-04-09 DIAGNOSIS — E039 Hypothyroidism, unspecified: Secondary | ICD-10-CM | POA: Diagnosis present

## 2018-04-09 DIAGNOSIS — T501X6A Underdosing of loop [high-ceiling] diuretics, initial encounter: Secondary | ICD-10-CM | POA: Diagnosis present

## 2018-04-09 DIAGNOSIS — G2581 Restless legs syndrome: Secondary | ICD-10-CM | POA: Diagnosis present

## 2018-04-09 DIAGNOSIS — I480 Paroxysmal atrial fibrillation: Secondary | ICD-10-CM | POA: Diagnosis present

## 2018-04-09 DIAGNOSIS — I509 Heart failure, unspecified: Secondary | ICD-10-CM

## 2018-04-09 DIAGNOSIS — I110 Hypertensive heart disease with heart failure: Principal | ICD-10-CM | POA: Diagnosis present

## 2018-04-09 DIAGNOSIS — E119 Type 2 diabetes mellitus without complications: Secondary | ICD-10-CM | POA: Diagnosis present

## 2018-04-09 DIAGNOSIS — R0602 Shortness of breath: Secondary | ICD-10-CM

## 2018-04-09 DIAGNOSIS — Z888 Allergy status to other drugs, medicaments and biological substances status: Secondary | ICD-10-CM

## 2018-04-09 DIAGNOSIS — Z8701 Personal history of pneumonia (recurrent): Secondary | ICD-10-CM | POA: Diagnosis not present

## 2018-04-09 DIAGNOSIS — Z79890 Hormone replacement therapy (postmenopausal): Secondary | ICD-10-CM | POA: Diagnosis not present

## 2018-04-09 DIAGNOSIS — Y92009 Unspecified place in unspecified non-institutional (private) residence as the place of occurrence of the external cause: Secondary | ICD-10-CM | POA: Diagnosis not present

## 2018-04-09 DIAGNOSIS — Z87442 Personal history of urinary calculi: Secondary | ICD-10-CM | POA: Diagnosis not present

## 2018-04-09 DIAGNOSIS — Z8542 Personal history of malignant neoplasm of other parts of uterus: Secondary | ICD-10-CM | POA: Diagnosis not present

## 2018-04-09 DIAGNOSIS — R0902 Hypoxemia: Secondary | ICD-10-CM

## 2018-04-09 DIAGNOSIS — J449 Chronic obstructive pulmonary disease, unspecified: Secondary | ICD-10-CM | POA: Diagnosis present

## 2018-04-09 DIAGNOSIS — I2510 Atherosclerotic heart disease of native coronary artery without angina pectoris: Secondary | ICD-10-CM | POA: Diagnosis present

## 2018-04-09 DIAGNOSIS — E875 Hyperkalemia: Secondary | ICD-10-CM | POA: Diagnosis not present

## 2018-04-09 DIAGNOSIS — Z87891 Personal history of nicotine dependence: Secondary | ICD-10-CM

## 2018-04-09 DIAGNOSIS — I482 Chronic atrial fibrillation: Secondary | ICD-10-CM | POA: Diagnosis present

## 2018-04-09 DIAGNOSIS — K219 Gastro-esophageal reflux disease without esophagitis: Secondary | ICD-10-CM | POA: Diagnosis present

## 2018-04-09 DIAGNOSIS — Z91128 Patient's intentional underdosing of medication regimen for other reason: Secondary | ICD-10-CM

## 2018-04-09 DIAGNOSIS — Z87890 Personal history of sex reassignment: Secondary | ICD-10-CM

## 2018-04-09 DIAGNOSIS — Z79899 Other long term (current) drug therapy: Secondary | ICD-10-CM | POA: Diagnosis not present

## 2018-04-09 DIAGNOSIS — I5023 Acute on chronic systolic (congestive) heart failure: Secondary | ICD-10-CM

## 2018-04-09 DIAGNOSIS — Z882 Allergy status to sulfonamides status: Secondary | ICD-10-CM

## 2018-04-09 DIAGNOSIS — Z7984 Long term (current) use of oral hypoglycemic drugs: Secondary | ICD-10-CM

## 2018-04-09 DIAGNOSIS — G4733 Obstructive sleep apnea (adult) (pediatric): Secondary | ICD-10-CM | POA: Diagnosis present

## 2018-04-09 DIAGNOSIS — Z6841 Body Mass Index (BMI) 40.0 and over, adult: Secondary | ICD-10-CM

## 2018-04-09 DIAGNOSIS — Z886 Allergy status to analgesic agent status: Secondary | ICD-10-CM

## 2018-04-09 DIAGNOSIS — Z881 Allergy status to other antibiotic agents status: Secondary | ICD-10-CM

## 2018-04-09 DIAGNOSIS — R269 Unspecified abnormalities of gait and mobility: Secondary | ICD-10-CM | POA: Diagnosis not present

## 2018-04-09 LAB — URINALYSIS, ROUTINE W REFLEX MICROSCOPIC
Bilirubin Urine: NEGATIVE
Glucose, UA: NEGATIVE mg/dL
Ketones, ur: NEGATIVE mg/dL
Nitrite: NEGATIVE
Protein, ur: >=300 mg/dL — AB
RBC / HPF: >50 RBC/hpf — ABNORMAL HIGH (ref 0–5)
SPECIFIC GRAVITY, URINE: 1.013 (ref 1.005–1.030)
pH: 6 (ref 5.0–8.0)

## 2018-04-09 LAB — CBC WITH DIFFERENTIAL/PLATELET
Basophils Absolute: 0 10*3/uL (ref 0.0–0.1)
Basophils Relative: 0 %
Eosinophils Absolute: 0.1 10*3/uL (ref 0.0–0.7)
Eosinophils Relative: 1 %
HCT: 35.7 % — ABNORMAL LOW (ref 36.0–46.0)
HEMOGLOBIN: 10.3 g/dL — AB (ref 12.0–15.0)
LYMPHS ABS: 0.8 10*3/uL (ref 0.7–4.0)
LYMPHS PCT: 7 %
MCH: 24.6 pg — AB (ref 26.0–34.0)
MCHC: 28.9 g/dL — AB (ref 30.0–36.0)
MCV: 85.4 fL (ref 78.0–100.0)
MONOS PCT: 6 %
Monocytes Absolute: 0.6 10*3/uL (ref 0.1–1.0)
Neutro Abs: 8.9 10*3/uL — ABNORMAL HIGH (ref 1.7–7.7)
Neutrophils Relative %: 86 %
Platelets: 262 10*3/uL (ref 150–400)
RBC: 4.18 MIL/uL (ref 3.87–5.11)
RDW: 15.4 % (ref 11.5–15.5)
WBC: 10.5 10*3/uL (ref 4.0–10.5)

## 2018-04-09 LAB — COMPREHENSIVE METABOLIC PANEL
ALK PHOS: 85 U/L (ref 38–126)
ALT: 27 U/L (ref 14–54)
ANION GAP: 10 (ref 5–15)
AST: 33 U/L (ref 15–41)
Albumin: 3.3 g/dL — ABNORMAL LOW (ref 3.5–5.0)
BUN: 14 mg/dL (ref 6–20)
CALCIUM: 8.4 mg/dL — AB (ref 8.9–10.3)
CO2: 34 mmol/L — ABNORMAL HIGH (ref 22–32)
CREATININE: 0.86 mg/dL (ref 0.44–1.00)
Chloride: 98 mmol/L — ABNORMAL LOW (ref 101–111)
Glucose, Bld: 134 mg/dL — ABNORMAL HIGH (ref 65–99)
Potassium: 3.6 mmol/L (ref 3.5–5.1)
Sodium: 142 mmol/L (ref 135–145)
Total Bilirubin: 1 mg/dL (ref 0.3–1.2)
Total Protein: 7.7 g/dL (ref 6.5–8.1)

## 2018-04-09 LAB — I-STAT TROPONIN, ED: TROPONIN I, POC: 0 ng/mL (ref 0.00–0.08)

## 2018-04-09 LAB — BRAIN NATRIURETIC PEPTIDE: B NATRIURETIC PEPTIDE 5: 748.4 pg/mL — AB (ref 0.0–100.0)

## 2018-04-09 MED ORDER — POTASSIUM CHLORIDE ER 10 MEQ PO TBCR
10.0000 meq | EXTENDED_RELEASE_TABLET | Freq: Two times a day (BID) | ORAL | Status: DC
  Administered 2018-04-09: 10 meq via ORAL
  Filled 2018-04-09 (×3): qty 1

## 2018-04-09 MED ORDER — SODIUM CHLORIDE 0.9 % IV SOLN: 250.0000 mL | INTRAVENOUS | Status: DC | PRN

## 2018-04-09 MED ORDER — LEVOTHYROXINE SODIUM 75 MCG PO TABS
75.0000 ug | ORAL_TABLET | Freq: Every day | ORAL | Status: DC
  Administered 2018-04-10 – 2018-04-15 (×6): 75 ug via ORAL
  Filled 2018-04-09 (×4): qty 1
  Filled 2018-04-09: qty 3
  Filled 2018-04-09 (×2): qty 1

## 2018-04-09 MED ORDER — SERTRALINE HCL 100 MG PO TABS
100.0000 mg | ORAL_TABLET | Freq: Every day | ORAL | Status: DC
  Administered 2018-04-10 – 2018-04-15 (×6): 100 mg via ORAL
  Filled 2018-04-09 (×6): qty 1

## 2018-04-09 MED ORDER — SODIUM CHLORIDE 0.9% FLUSH
3.0000 mL | Freq: Two times a day (BID) | INTRAVENOUS | Status: DC
  Administered 2018-04-09 – 2018-04-15 (×11): 3 mL via INTRAVENOUS

## 2018-04-09 MED ORDER — GABAPENTIN 100 MG PO CAPS
100.0000 mg | ORAL_CAPSULE | Freq: Two times a day (BID) | ORAL | Status: DC
  Administered 2018-04-09 – 2018-04-15 (×12): 100 mg via ORAL
  Filled 2018-04-09 (×12): qty 1

## 2018-04-09 MED ORDER — ACETAMINOPHEN 325 MG PO TABS: 650.0000 mg | ORAL_TABLET | ORAL | Status: DC | PRN

## 2018-04-09 MED ORDER — HEPARIN SODIUM (PORCINE) 5000 UNIT/ML IJ SOLN
5000.0000 [IU] | Freq: Three times a day (TID) | INTRAMUSCULAR | Status: DC
  Administered 2018-04-09 – 2018-04-15 (×17): 5000 [IU] via SUBCUTANEOUS
  Filled 2018-04-09 (×20): qty 1

## 2018-04-09 MED ORDER — LORATADINE 10 MG PO TABS
10.0000 mg | ORAL_TABLET | Freq: Every day | ORAL | Status: DC
  Administered 2018-04-10 – 2018-04-15 (×6): 10 mg via ORAL
  Filled 2018-04-09 (×6): qty 1

## 2018-04-09 MED ORDER — SODIUM CHLORIDE 0.9% FLUSH: 3.0000 mL | INTRAVENOUS | Status: DC | PRN

## 2018-04-09 MED ORDER — METOPROLOL TARTRATE 50 MG PO TABS
50.0000 mg | ORAL_TABLET | Freq: Two times a day (BID) | ORAL | Status: DC
  Administered 2018-04-09 – 2018-04-15 (×12): 50 mg via ORAL
  Filled 2018-04-09 (×12): qty 1

## 2018-04-09 MED ORDER — ORAL CARE MOUTH RINSE
15.0000 mL | Freq: Two times a day (BID) | OROMUCOSAL | Status: DC
  Administered 2018-04-09 – 2018-04-13 (×8): 15 mL via OROMUCOSAL

## 2018-04-09 MED ORDER — PROCHLORPERAZINE MALEATE 10 MG PO TABS
10.0000 mg | ORAL_TABLET | Freq: Four times a day (QID) | ORAL | Status: DC | PRN
  Administered 2018-04-09 – 2018-04-14 (×4): 10 mg via ORAL
  Filled 2018-04-09 (×5): qty 1

## 2018-04-09 MED ORDER — DILTIAZEM HCL ER COATED BEADS 240 MG PO CP24
240.0000 mg | ORAL_CAPSULE | Freq: Every day | ORAL | Status: DC
  Administered 2018-04-10 – 2018-04-15 (×6): 240 mg via ORAL
  Filled 2018-04-09 (×6): qty 1

## 2018-04-09 MED ORDER — FUROSEMIDE 10 MG/ML IJ SOLN
40.0000 mg | Freq: Two times a day (BID) | INTRAMUSCULAR | Status: DC
  Administered 2018-04-10 – 2018-04-11 (×3): 40 mg via INTRAVENOUS
  Filled 2018-04-09 (×4): qty 4

## 2018-04-09 MED ORDER — METFORMIN HCL 500 MG PO TABS
500.0000 mg | ORAL_TABLET | Freq: Two times a day (BID) | ORAL | Status: DC
  Administered 2018-04-10 – 2018-04-15 (×11): 500 mg via ORAL
  Filled 2018-04-09 (×11): qty 1

## 2018-04-09 MED ORDER — FAMOTIDINE 20 MG PO TABS
20.0000 mg | ORAL_TABLET | Freq: Two times a day (BID) | ORAL | Status: DC
  Administered 2018-04-09 – 2018-04-15 (×12): 20 mg via ORAL
  Filled 2018-04-09 (×12): qty 1

## 2018-04-09 MED ORDER — FUROSEMIDE 10 MG/ML IJ SOLN
20.0000 mg | Freq: Once | INTRAMUSCULAR | Status: AC
  Administered 2018-04-09: 20 mg via INTRAVENOUS
  Filled 2018-04-09: qty 4

## 2018-04-09 NOTE — H&P (Signed)
Referring Physician:  KILLIAN RESS is an 62 y.o. female.                       Chief Complaint: Shortness of breath   HPI: 62 years old female with PMH of CHF, CAD, Hypertension, GERD, Hypothyroidism, Morbid obesity and obstructive sleep apnea has increasing shortness of breath. She admits to excessive fluid intake. She has stopped taking lasix for 1 month as she is unable to reach bathroom to urinate. Chest x-ray shows pulmonary edema and BNP is elevated at 748.4 pg.  Past Medical History:  Diagnosis Date  . Acute and chronic respiratory failure with hypercapnia ([place])   . ADD (attention deficit disorder)   . Altered mental status    2nd to hypercapnea  . CHF (congestive heart failure) ([place])   . Chronic low back pain   . Complication of anesthesia    " I AM SLOW TO WAKE UP AT ITMES "  . Coronary artery disease   . Depression   . Diabetes mellitus   . Endometrial cancer ([place])   . GERD (gastroesophageal reflux disease)   . Hypercapnia   . Hypertension   . Hypothyroidism   . Hypoxemia   . Morbid obesity ([place])   . Nephrolithiasis   . OSA (obstructive sleep apnea)   . Paroxysmal atrial fibrillation (HCC)   . Restless leg syndrome       Past Surgical History:  Procedure Laterality Date  . BACK SURGERY     x2  . DENTAL SURGERY    . SHOULDER SURGERY     x2  . SP PERC NEPHROSTOMY    . TONSILLECTOMY    . VAGINAL HYSTERECTOMY     x2    Family History  Problem Relation Age of Onset  . Cancer Father    Social History:  reports that she quit smoking about 43 years ago. Her smoking use included cigarettes. She has a 1.00 pack-year smoking history. She has never used smokeless tobacco. She reports that she does not drink alcohol or use drugs.  Allergies:  Allergies  Allergen Reactions  . Aspirin Other (See Comments)    Chest pain  . Eliquis [Apixaban]     Excessive bleeding  . Nsaids Other (See Comments)    Chest pain  . Other Other (See Comments)    "BP med"  combined with plavix, caused syncope  . Plavix [Clopidogrel] Other (See Comments)    syncope  . Sudafed [Pseudoephedrine] Palpitations    Raised BP  . Erythromycin Nausea And Vomiting  . Sulfa Antibiotics Other (See Comments)    constipation  . Sulfamethoxazole-Trimethoprim Diarrhea  . Altace [Ramipril] Other (See Comments)    Severe fatigue  . Zofran [Ondansetron Hcl] Other (See Comments)    "I put me into a coma"     (Not in a hospital admission)  Results for orders placed or performed during the hospital encounter of 04/09/18 (from the past 48 hour(s))  Urinalysis, Routine w reflex microscopic     Status: Abnormal   Collection Time: 04/09/18  5:46 PM  Result Value Ref Range   Color, Urine YELLOW YELLOW   APPearance HAZY (A) CLEAR   Specific Gravity, Urine 1.013 1.005 - 1.030   pH 6.0 5.0 - 8.0   Glucose, UA NEGATIVE NEGATIVE mg/dL   Hgb urine dipstick LARGE (A) NEGATIVE   Bilirubin Urine NEGATIVE NEGATIVE   Ketones, ur NEGATIVE NEGATIVE mg/dL   Protein, ur >=300 (A) NEGATIVE  mg/dL   Nitrite NEGATIVE NEGATIVE   Leukocytes, UA SMALL (A) NEGATIVE   RBC / HPF >50 (H) 0 - 5 RBC/hpf   WBC, UA 11-20 0 - 5 WBC/hpf   Bacteria, UA RARE (A) NONE SEEN   Squamous Epithelial / LPF 0-5 0 - 5    Comment: Please note change in reference range.   Mucus PRESENT     Comment: Performed at [facility], [place] [address] [place], [place] [address]  CBC with Differential     Status: Abnormal   Collection Time: 04/09/18  6:21 PM  Result Value Ref Range   WBC 10.5 4.0 - 10.5 K/uL   RBC 4.18 3.87 - 5.11 MIL/uL   Hemoglobin 10.3 (L) 12.0 - 15.0 g/dL   HCT 35.7 (L) 36.0 - 46.0 %   MCV 85.4 78.0 - 100.0 fL   MCH 24.6 (L) 26.0 - 34.0 pg   MCHC 28.9 (L) 30.0 - 36.0 g/dL   RDW 15.4 11.5 - 15.5 %   Platelets 262 150 - 400 K/uL   Neutrophils Relative % 86 %   Neutro Abs 8.9 (H) 1.7 - 7.7 K/uL   Lymphocytes Relative 7 %   Lymphs Abs 0.8 0.7 - 4.0 K/uL   Monocytes Relative 6 %    Monocytes Absolute 0.6 0.1 - 1.0 K/uL   Eosinophils Relative 1 %   Eosinophils Absolute 0.1 0.0 - 0.7 K/uL   Basophils Relative 0 %   Basophils Absolute 0.0 0.0 - 0.1 K/uL    Comment: Performed at [facility], [place] [address], [place], [place] [address]  Comprehensive metabolic panel     Status: Abnormal   Collection Time: 04/09/18  6:21 PM  Result Value Ref Range   Sodium 142 135 - 145 mmol/L   Potassium 3.6 3.5 - 5.1 mmol/L   Chloride 98 (L) 101 - 111 mmol/L   CO2 34 (H) 22 - 32 mmol/L   Glucose, Bld 134 (H) 65 - 99 mg/dL   BUN 14 6 - 20 mg/dL   Creatinine, Ser 0.86 0.44 - 1.00 mg/dL   Calcium 8.4 (L) 8.9 - 10.3 mg/dL   Total Protein 7.7 6.5 - 8.1 g/dL   Albumin 3.3 (L) 3.5 - 5.0 g/dL   AST 33 15 - 41 U/L   ALT 27 14 - 54 U/L   Alkaline Phosphatase 85 38 - 126 U/L   Total Bilirubin 1.0 0.3 - 1.2 mg/dL   GFR calc non Af Amer >60 >60 mL/min   GFR calc Af Amer >60 >60 mL/min    Comment: (NOTE) The eGFR has been calculated using the CKD EPI equation. This calculation has not been validated in all clinical situations. eGFR's persistently <60 mL/min signify possible Chronic Kidney Disease.    Anion gap 10 5 - 15    Comment: Performed at [facility], [place] [address], [place], [place] [address]  Brain natriuretic peptide     Status: Abnormal   Collection Time: 04/09/18  6:21 PM  Result Value Ref Range   B Natriuretic Peptide 748.4 (H) 0.0 - 100.0 pg/mL    Comment: Performed at [facility], [place] [address] [place],  [address]  I-Stat Troponin, ED (not at [facility])     Status: None   Collection Time: 04/09/18  6:31 PM  Result Value Ref Range   Troponin i, poc 0.00 0.00 - 0.08 ng/mL   Comment 3  Comment: Due to the release kinetics of cTnI, a negative result within the first hours of the onset of symptoms does not rule out myocardial infarction with certainty. If myocardial infarction is still  suspected, repeat the test at appropriate intervals.    Dg Chest 2 View  Result Date: 04/09/2018 CLINICAL DATA:  62 y/o F; increased dyspnea on exertion for the past few days. EXAM: CHEST - 2 VIEW COMPARISON:  01/31/2018 chest radiograph FINDINGS: Stable cardiac silhouette given projection and technique. Low lung volumes. Reticular and hazy opacities of the lungs. No focal consolidation. No pleural effusion or pneumothorax. Bones are unremarkable. IMPRESSION: Reticular and hazy opacities of the lungs probably representing interstitial and alveolar pulmonary edema. Electronically Signed   By: Kristine Garbe M.D.   On: 04/09/2018 18:20    Review Of Systems Constitutional: No fever, chills, positive weight gain. Eyes: No vision change, wears glasses. No discharge or pain. Ears: No hearing loss, No tinnitus. Respiratory: Positive asthma, COPD, pneumonias, shortness of breath. No hemoptysis. Cardiovascular: Positive chest pain, palpitation, leg edema. Gastrointestinal: Positive nausea, vomiting, diarrhea, constipation, GI bleed. No hepatitis. Genitourinary: No dysuria, hematuria, kidney stone. No incontinance. Neurological: Positive headache, stroke, seizures.  Psychiatry: No psych facility admission for anxiety, depression, suicide. No detox. Skin: No rash. Musculoskeletal: Positive joint pain, fibromyalgia, neck pain, back pain. Lymphadenopathy: No lymphadenopathy. Hematology: No anemia or easy bruising.   Blood pressure (!) 144/96, pulse 74, temperature 98.3 F (36.8 C), temperature source Oral, resp. rate 16, height '5\' 5"'$  (1.651 m), weight 132 kg (291 lb), SpO2 95 %. Body mass index is 48.42 kg/m. General appearance: alert, cooperative, appears stated age and no distress Head: Normocephalic, atraumatic. Eyes: Blue eyes, pink conjunctiva, corneas clear. PERRL, EOM's intact. Neck: No adenopathy, no carotid bruit, Positive JVD, supple, symmetrical, trachea midline and thyroid not  enlarged. Resp: Crackles to auscultation bilaterally. Cardio: Regular rate and rhythm, S1, S2 normal, II/VI systolic murmur, no click, rub or gallop GI: Soft, non-tender; bowel sounds normal; no organomegaly. Extremities: 2 + edema, no cyanosis or clubbing. Skin: Warm and dry.  Neurologic: Alert and oriented X 3, normal strength. Normal coordination and wheel chair bound.  Assessment/Plan Acute on chronic left heart systolic failure CAD COPD Hypertension Hypothyroidism Morbid obesity Sleep apnea  Admit IV lasix Oxygen Breathing treatments  Birdie Riddle, MD  04/09/2018, 9:12 PM

## 2018-04-09 NOTE — ED Triage Notes (Signed)
Pt arrived via EMS from a [place] room in where she lives. Pt resides with family there. Pt c/o increased DOE x past few days. Pt has hx of COPD/CHF.Pt  Denies chest pain. Pt has some bilateral LE edema per EMS.   Pt was at 92% on 4 lpm of [place] and decreased to 84% when asked to pivot. Pt placed on 6 LPM [place] and pt remained at 96%.   EMS BP 154/97 HR 84 RR 20 94% 6 lpm CBG 191

## 2018-04-09 NOTE — ED Notes (Signed)
ED TO INPATIENT HANDOFF REPORT  Name/Age/Gender [name] 62 y.o. female  Code Status    Code Status Orders  (From admission, onward)        Start     Ordered   04/09/18 2101  Full code  Continuous     04/09/18 2107    Code Status History    Date Active Date Inactive Code Status Order ID Comments User Context   01/31/2018 2016 02/03/2018 2252 Full Code [id number]  Etta Quill, DO ED   08/26/2017 0127 08/30/2017 2135 Full Code [id number]  Norval Morton, MD ED   07/02/2017 1612 07/11/2017 2215 Full Code [id number]  Isaac Bliss, Rayford Halsted, MD ED   02/10/2016 2144 02/13/2016 1813 Full Code [id number]  Dixie Dials, MD ED   12/25/2015 2134 01/02/2016 2112 Full Code [id number]  Collene Gobble, MD Inpatient   11/09/2014 0316 11/11/2014 1941 Full Code [id number]  Dixie Dials, MD Inpatient   04/12/2014 0119 04/14/2014 0038 Full Code [id number]  Charolette Forward, MD Inpatient      Home/SNF/Other Home  Chief Complaint Shortness of Breath   Level of Care/Admitting Diagnosis ED Disposition    ED Disposition Condition [place]: [facility] [[id number]]  Level of Care: Telemetry [5]  Admit to tele based on following criteria: Acute CHF  Diagnosis: Acute on chronic left systolic heart failure [facility]) [[id number]]  Admitting Physician: Dixie Dials [[id number]]  Attending Physician: Dixie Dials [[id number]]  Estimated length of stay: 3 - 4 days  Certification:: I certify this patient will need inpatient services for at least 2 midnights  PT Class (Do Not Modify): Inpatient [101]  PT Acc Code (Do Not Modify): Private [1]       Medical History Past Medical History:  Diagnosis Date  . Acute and chronic respiratory failure with hypercapnia ([place])   . ADD (attention deficit disorder)   . Altered mental status    2nd to hypercapnea  . CHF (congestive heart failure) ([place])   . Chronic low back pain   . Complication of anesthesia    " I AM SLOW TO WAKE UP AT  ITMES "  . Coronary artery disease   . Depression   . Diabetes mellitus   . Endometrial cancer ([place])   . GERD (gastroesophageal reflux disease)   . Hypercapnia   . Hypertension   . Hypothyroidism   . Hypoxemia   . Morbid obesity ([place])   . Nephrolithiasis   . OSA (obstructive sleep apnea)   . Paroxysmal atrial fibrillation (H[facility])   . Restless leg syndrome     Allergies Allergies  Allergen Reactions  . Aspirin Other (See Comments)    Chest pain  . Eliquis [Apixaban]     Excessive bleeding  . Nsaids Other (See Comments)    Chest pain  . Other Other (See Comments)    "BP med" combined with plavix, caused syncope  . Plavix [Clopidogrel] Other (See Comments)    syncope  . Sudafed [Pseudoephedrine] Palpitations    Raised BP  . Erythromycin Nausea And Vomiting  . Sulfa Antibiotics Other (See Comments)    constipation  . Sulfamethoxazole-Trimethoprim Diarrhea  . Altace [Ramipril] Other (See Comments)    Severe fatigue  . Zofran [Ondansetron Hcl] Other (See Comments)    "I put me into a coma"    IV Location/Drains/Wounds Patient Lines/Drains/Airways Status   Active Line/Drains/Airways    Name:   Placement date:   Placement time:  Site:   Days:   Peripheral IV 08/25/17 Right Antecubital   08/25/17    2257    Antecubital   227   Peripheral IV 04/09/18 Forearm   04/09/18    1830    Forearm   less than 1   External Urinary Catheter   08/29/17    1117    -   223   External Urinary Catheter   04/09/18    1754    -   less than 1   Pressure Injury 08/26/17 Stage II -  Partial thickness loss of dermis presenting as a shallow open ulcer with a red, pink wound bed without slough. right buttock at crease; 47mXmm;    08/26/17    1630     226   Wound / Incision (Open or Dehisced) 07/07/17 Non-pressure wound Leg Left;Lower cellulitis   07/07/17    0854    Leg   276          Labs/Imaging Results for orders placed or performed during the hospital encounter of 04/09/18 (from the past 48  hour(s))  Urinalysis, Routine w reflex microscopic     Status: Abnormal   Collection Time: 04/09/18  5:46 PM  Result Value Ref Range   Color, Urine YELLOW YELLOW   APPearance HAZY (A) CLEAR   Specific Gravity, Urine 1.013 1.005 - 1.030   pH 6.0 5.0 - 8.0   Glucose, UA NEGATIVE NEGATIVE mg/dL   Hgb urine dipstick LARGE (A) NEGATIVE   Bilirubin Urine NEGATIVE NEGATIVE   Ketones, ur NEGATIVE NEGATIVE mg/dL   Protein, ur >=300 (A) NEGATIVE mg/dL   Nitrite NEGATIVE NEGATIVE   Leukocytes, UA SMALL (A) NEGATIVE   RBC / HPF >50 (H) 0 - 5 RBC/hpf   WBC, UA 11-20 0 - 5 WBC/hpf   Bacteria, UA RARE (A) NONE SEEN   Squamous Epithelial / LPF 0-5 0 - 5    Comment: Please note change in reference range.   Mucus PRESENT     Comment: [facility] [id number], [place] [address] CBC with Differential     Status: Abnormal   Collection Time: 04/09/18  6:21 PM  Result Value Ref Range   WBC 10.5 4.0 - 10.5 K/uL   RBC 4.18 3.87 - 5.11 MIL/uL   Hemoglobin 10.3 (L) 12.0 - 15.0 g/dL   HCT 35.7 (L) 36.0 - 46.0 %   MCV 85.4 78.0 - 100.0 fL   MCH 24.6 (L) 26.0 - 34.0 pg   MCHC 28.9 (L) 30.0 - 36.0 g/dL   RDW 15.4 11.5 - 15.5 %   Platelets 262 150 - 400 K/uL   Neutrophils Relative % 86 %   Neutro Abs 8.9 (H) 1.7 - 7.7 K/uL   Lymphocytes Relative 7 %   Lymphs Abs 0.8 0.7 - 4.0 K/uL   Monocytes Relative 6 %   Monocytes Absolute 0.6 0.1 - 1.0 K/uL   Eosinophils Relative 1 %   Eosinophils Absolute 0.1 0.0 - 0.7 K/uL   Basophils Relative 0 %   Basophils Absolute 0.0 0.0 - 0.1 K/uL    Comment: Performed at [facility] [id number], [place] [id number] Comprehensive metabolic panel     Status: Abnormal   Collection Time: 04/09/18  6:21 PM  Result Value Ref Range   Sodium 142 135 - 145 mmol/L   Potassium 3.6 3.5 - 5.1 mmol/L   Chloride 98 (L) 101 - 111  mmol/L   CO2 34 (H) 22 - 32 mmol/L   Glucose, Bld 134 (H) 65 - 99 mg/dL    BUN 14 6 - 20 mg/dL   Creatinine, Ser 0.86 0.44 - 1.00 mg/dL   Calcium 8.4 (L) 8.9 - 10.3 mg/dL   Total Protein 7.7 6.5 - 8.1 g/dL   Albumin 3.3 (L) 3.5 - 5.0 g/dL   AST 33 15 - 41 U/L   ALT 27 14 - 54 U/L   Alkaline Phosphatase 85 38 - 126 U/L   Total Bilirubin 1.0 0.3 - 1.2 mg/dL   GFR calc non Af Amer >60 >60 mL/min   GFR calc Af Amer >60 >60 mL/min    Comment: (NOTE) The eGFR has been calculated using the CKD EPI equation. This calculation has not been validated in all clinical situations. eGFR's persistently <60 mL/min signify possible Chronic Kidney Disease.    Anion gap 10 5 - 15    Comment: Performed at [facility], [place] [address], [place], [place] [address]  Brain natriuretic peptide     Status: Abnormal   Collection Time: 04/09/18  6:21 PM  Result Value Ref Range   B Natriuretic Peptide 748.4 (H) 0.0 - 100.0 pg/mL    Comment: Performed at [facility], [place] [address], [place], [place] [address]  I-Stat Troponin, ED (not at [facility])     Status: None   Collection Time: 04/09/18  6:31 PM  Result Value Ref Range   Troponin i, poc 0.00 0.00 - 0.08 ng/mL   Comment 3            Comment: Due to the release kinetics of cTnI, a negative result within the first hours of the onset of symptoms does not rule out myocardial infarction with certainty. If myocardial infarction is still suspected, repeat the test at appropriate intervals.    Dg Chest 2 View  Result Date: 04/09/2018 CLINICAL DATA:  62 y/o F; increased dyspnea on exertion for the past few days. EXAM: CHEST - 2 VIEW COMPARISON:  01/31/2018 chest radiograph FINDINGS: Stable cardiac silhouette given projection and technique. Low lung volumes. Reticular and hazy opacities of the lungs. No focal consolidation. No pleural effusion or pneumothorax. Bones are unremarkable. IMPRESSION: Reticular and hazy opacities of the lungs probably representing interstitial and alveolar pulmonary edema.  Electronically Signed   By: Kristine Garbe M.D.   On: 04/09/2018 18:20    Pending Labs Unresulted Labs (From admission, onward)   Start     Ordered   04/10/18 2426  Basic metabolic panel  Daily,   R     04/09/18 2107   04/09/18 2101  Troponin I  Now then every 6 hours,   R     04/09/18 2107   04/09/18 1737  Urine culture  STAT,   STAT     04/09/18 1737      Vitals/Pain Today's Vitals   04/09/18 1823 04/09/18 1953 04/09/18 2036 04/09/18 2135  BP:  (!) 142/96 (!) 144/96 (!) 146/121  Pulse:  83 74 81  Resp:  '14 16 14  '$ Temp:      TempSrc:      SpO2:  98% 95% 99%  Weight: 291 lb (132 kg)     Height: '5\' 5"'$  (1.651 m)       Isolation Precautions No active isolations  Medications Medications  heparin injection 5,000 Units (has no administration in time range)  sodium chloride flush (NS) 0.9 % injection 3 mL (has  no administration in time range)  sodium chloride flush (NS) 0.9 % injection 3 mL (has no administration in time range)  0.9 %  sodium chloride infusion (has no administration in time range)  furosemide (LASIX) injection 40 mg (has no administration in time range)  acetaminophen (TYLENOL) tablet 650 mg (has no administration in time range)  potassium chloride (K-DUR) CR tablet 10 mEq (has no administration in time range)  loratadine (CLARITIN) tablet 10 mg (has no administration in time range)  diltiazem (CARDIZEM CD) 24 hr capsule 240 mg (has no administration in time range)  gabapentin (NEURONTIN) capsule 100 mg (has no administration in time range)  metFORMIN (GLUCOPHAGE) tablet 500 mg (has no administration in time range)  metoprolol tartrate (LOPRESSOR) tablet 50 mg (has no administration in time range)  prochlorperazine (COMPAZINE) tablet 10 mg (has no administration in time range)  famotidine (PEPCID) tablet 20 mg (has no administration in time range)  sertraline (ZOLOFT) tablet 100 mg (has no administration in time range)  levothyroxine (SYNTHROID,  LEVOTHROID) tablet 75 mcg (has no administration in time range)  furosemide (LASIX) injection 20 mg (20 mg Intravenous Given 04/09/18 2102)    Mobility walks with person assist

## 2018-04-09 NOTE — ED Notes (Signed)
Bed: WA07 Expected date:  Expected time:  Means of arrival:  Comments: EMS-CHF

## 2018-04-09 NOTE — ED Notes (Signed)
Report called to [place].

## 2018-04-09 NOTE — ED Provider Notes (Signed)
[facility] DEPT Provider Note   CSN: [id number] Arrival date & time: 04/09/18  1712     History   Chief Complaint Chief Complaint  Patient presents with  . Shortness of Breath    HPI [name] is a 62 y.o. female.  The history is provided by the patient and medical records.  Shortness of Breath  This is a recurrent problem. The average episode lasts 2 weeks. The problem occurs continuously.The current episode started more than 1 week ago. The problem has been gradually worsening. Associated symptoms include leg swelling. Pertinent negatives include no fever, no headaches, no rhinorrhea, no sore throat, no neck pain, no cough, no sputum production, no hemoptysis, no wheezing, no chest pain, no syncope, no vomiting, no abdominal pain and no leg pain. It is unknown what precipitated the problem. She has tried nothing for the symptoms. The treatment provided no relief. She has had prior hospitalizations. Associated medical issues include CAD and heart failure.    Past Medical History:  Diagnosis Date  . Acute and chronic respiratory failure with hypercapnia ([place])   . ADD (attention deficit disorder)   . Altered mental status    2nd to hypercapnea  . CHF (congestive heart failure) ([place])   . Chronic low back pain   . Complication of anesthesia    " I AM SLOW TO WAKE UP AT ITMES "  . Coronary artery disease   . Depression   . Diabetes mellitus   . Endometrial cancer ([place])   . GERD (gastroesophageal reflux disease)   . Hypercapnia   . Hypertension   . Hypothyroidism   . Hypoxemia   . Morbid obesity ([place])   . Nephrolithiasis   . OSA (obstructive sleep apnea)   . Paroxysmal atrial fibrillation (H[facility])   . Restless leg syndrome     Patient Active Problem List   Diagnosis Date Noted  . A-fib ([place]) 02/01/2018  . Acute pyelonephritis 01/31/2018  . Asthma 10/23/2017  . Anxiety and depression 09/05/2017  . At risk for adverse drug reaction  09/04/2017  . GERD (gastroesophageal reflux disease) 09/04/2017  . Obesity hypoventilation syndrome ([place])   . Rhinovirus infection   . COPD with acute exacerbation ([place])   . Systolic and diastolic CHF, chronic ([place])   . Noncompliance   . Acute on chronic respiratory failure with hypoxia and hypercapnia ([place]) 08/26/2017  . Hypothyroidism 08/26/2017  . Hypochromic anemia 08/26/2017  . Pressure injury of skin 07/03/2017  . Cellulitis of leg, left 07/02/2017  . Obesity, Class III, BMI 40-49.9 (morbid obesity) ([place]) 07/02/2017  . Acute on chronic combined systolic and diastolic CHF (congestive heart failure) ([place]) 02/10/2016  . Seizures ([place])   . Aspiration pneumonia ([place])   . Sepsis ([place])   . Acute on chronic respiratory failure ([place]) 12/25/2015  . Pneumonia 11/09/2014  . CAP (community acquired pneumonia) 10/08/2014  . Atrial fibrillation with RVR ([place]) 04/11/2014  . Narcotic overdose ([place]) 07/07/2013  . Hypoxia 07/07/2013  . Diabetes mellitus ([place]) 07/07/2013  . HTN (hypertension) 07/07/2013  . Chronic pain 07/07/2013  . Hypercapnic respiratory failure, chronic ([place]) 10/17/2011  . OSA (obstructive sleep apnea) 12/23/2007    Past Surgical History:  Procedure Laterality Date  . BACK SURGERY     x2  . DENTAL SURGERY    . SHOULDER SURGERY     x2  . SP PERC NEPHROSTOMY    . TONSILLECTOMY    . VAGINAL HYSTERECTOMY     x2  OB History   None      Home Medications    Prior to Admission medications   Medication Sig Start Date End Date Taking? Authorizing Provider  buprenorphine (SUBUTEX) 8 MG SUBL SL tablet Place 1 tablet (8 mg total) under the tongue 2 (two) times daily. 08/31/17   Medina-Vargas, Monina C, NP  cetirizine (ZYRTEC) 10 MG tablet Take 10 mg by mouth at bedtime.    [provider]  Cholecalciferol (VITAMIN D PO) Take 2,000 mg by mouth daily.    [provider]  diltiazem (DILACOR XR) 240 MG 24 hr capsule Take 1 capsule (240 mg total) by  mouth daily. 02/03/18   Roxan Hockey, MD  furosemide (LASIX) 20 MG tablet Take 1 tablet (20 mg total) by mouth 2 (two) times daily. 02/03/18   Roxan Hockey, MD  gabapentin (NEURONTIN) 100 MG capsule Take 1 capsule (100 mg total) by mouth 2 (two) times daily. 09/18/17   Medina-Vargas, Monina C, NP  ketotifen (ZADITOR) 0.025 % ophthalmic solution Place 1 drop into both eyes 3 (three) times daily as needed. 09/18/17   Medina-Vargas, Monina C, NP  levalbuterol (XOPENEX) 1.25 MG/0.5ML nebulizer solution Take 1.25 mg 3 (three) times daily as needed by nebulization for wheezing or shortness of breath. 10/26/17   Parrett, Fonnie Mu, NP  metFORMIN (GLUCOPHAGE) 500 MG tablet Take 1 tablet (500 mg total) by mouth 2 (two) times daily with a meal. 02/03/18   Emokpae, Courage, MD  metoprolol tartrate (LOPRESSOR) 50 MG tablet Take 1 tablet (50 mg total) by mouth 2 (two) times daily. 02/03/18   Roxan Hockey, MD  Multiple Vitamins-Minerals (HAIR SKIN NAILS PO) Take 1 tablet by mouth daily.    [provider]  potassium chloride (K-DUR) 10 MEQ tablet Take 1 tablet (10 mEq total) by mouth 2 (two) times daily. 09/18/17   Medina-Vargas, Monina C, NP  prochlorperazine (COMPAZINE) 10 MG tablet Take 1 tablet (10 mg total) by mouth every 6 (six) hours as needed for nausea or vomiting. 09/18/17   Medina-Vargas, Monina C, NP  ranitidine (ZANTAC) 150 MG tablet Take 150 mg 2 (two) times daily by mouth.    [provider]  sertraline (ZOLOFT) 100 MG tablet Take 1 tablet (100 mg total) by mouth daily. 09/18/17   Medina-Vargas, Monina C, NP  SYNTHROID 75 MCG tablet Take 1 tablet (75 mcg total) by mouth daily before breakfast. 09/18/17   Medina-Vargas, Monina C, NP    Family History Family History  Problem Relation Age of Onset  . Cancer Father     Social History Social History   Tobacco Use  . Smoking status: Former Smoker    Packs/day: 0.50    Years: 2.00    Pack years: 1.00    Types: Cigarettes     Last attempt to quit: 09/10/1974    Years since quitting: 43.6  . Smokeless tobacco: Never Used  . Tobacco comment: quit in teenage years  Substance Use Topics  . Alcohol use: No  . Drug use: No     Allergies   Aspirin; Eliquis [apixaban]; Nsaids; Other; Plavix [clopidogrel]; Sudafed [pseudoephedrine]; Erythromycin; Sulfa antibiotics; Sulfamethoxazole-trimethoprim; Altace [ramipril]; and Zofran [ondansetron hcl]   Review of Systems Review of Systems  Constitutional: Positive for fatigue. Negative for chills, diaphoresis and fever.  HENT: Negative for congestion, rhinorrhea and sore throat.   Respiratory: Positive for shortness of breath. Negative for cough, hemoptysis, sputum production, chest tightness, wheezing and stridor.   Cardiovascular: Positive for leg swelling. Negative  for chest pain and syncope.  Gastrointestinal: Negative for abdominal pain, diarrhea, nausea and vomiting.  Genitourinary: Negative for difficulty urinating, dysuria, flank pain and frequency.  Musculoskeletal: Negative for back pain, neck pain and neck stiffness.  Neurological: Negative for light-headedness and headaches.  Psychiatric/Behavioral: Negative for agitation.  All other systems reviewed and are negative.    Physical Exam Updated Vital Signs BP (!) 148/87 (BP Location: Left Arm)   Pulse 81   Temp 98.3 F (36.8 C) (Oral)   Resp (!) 22   Ht 5\' 5"  (1.651 m)   Wt (!) 146 kg (321 lb 14 oz)   SpO2 92%   BMI 53.56 kg/m   Physical Exam  Constitutional: She is oriented to person, place, and time. She appears well-developed and well-nourished.  Non-toxic appearance. She does not appear ill. No distress.  HENT:  Head: Normocephalic and atraumatic.  Eyes: Pupils are equal, round, and reactive to light. Conjunctivae and EOM are normal.  Neck: Neck supple.  Cardiovascular: Normal rate and intact distal pulses.  No murmur heard. Pulmonary/Chest: Effort normal. Tachypnea noted. She has no wheezes.  She has no rhonchi. She has rales. She exhibits no tenderness.  Abdominal: She exhibits no distension. There is no tenderness.  Musculoskeletal: She exhibits edema. She exhibits no tenderness or deformity.  Neurological: She is alert and oriented to person, place, and time. No sensory deficit. She exhibits normal muscle tone.  Skin: Capillary refill takes less than 2 seconds. No rash noted. She is not diaphoretic. No erythema.  Psychiatric: She has a normal mood and affect.  Nursing note and vitals reviewed.    ED Treatments / Results  Labs (all labs ordered are listed, but only abnormal results are displayed) Labs Reviewed  CBC WITH DIFFERENTIAL/PLATELET - Abnormal; Notable for the following components:      Result Value   Hemoglobin 10.3 (*)    HCT 35.7 (*)    MCH 24.6 (*)    MCHC 28.9 (*)    Neutro Abs 8.9 (*)    All other components within normal limits  COMPREHENSIVE METABOLIC PANEL - Abnormal; Notable for the following components:   Chloride 98 (*)    CO2 34 (*)    Glucose, Bld 134 (*)    Calcium 8.4 (*)    Albumin 3.3 (*)    All other components within normal limits  BRAIN NATRIURETIC PEPTIDE - Abnormal; Notable for the following components:   B Natriuretic Peptide 748.4 (*)    All other components within normal limits  URINALYSIS, ROUTINE W REFLEX MICROSCOPIC - Abnormal; Notable for the following components:   APPearance HAZY (*)    Hgb urine dipstick LARGE (*)    Protein, ur >=300 (*)    Leukocytes, UA SMALL (*)    RBC / HPF >50 (*)    Bacteria, UA RARE (*)    All other components within normal limits  URINE CULTURE  MRSA PCR SCREENING  TROPONIN I  TROPONIN I  TROPONIN I  BASIC METABOLIC PANEL  I-STAT TROPONIN, ED    EKG EKG Interpretation  Date/Time:  Tuesday April 09 2018 17:41:06 EDT Ventricular Rate:  80 PR Interval:    QRS Duration: 155 QT Interval:  491 QTC Calculation: 534 R Axis:   61 Text Interpretation:  Atrial fibrillation Ventricular  premature complex Nonspecific intraventricular conduction delay Borderline abnrm T, anterolateral leads When compared to prior, slower rate and more artifact.  No STEMI Confirmed by Antony Blackbird [phone]) on 04/09/2018 6:19:24 PM  Radiology Dg Chest 2 View  Result Date: 04/09/2018 CLINICAL DATA:  62 y/o F; increased dyspnea on exertion for the past few days. EXAM: CHEST - 2 VIEW COMPARISON:  01/31/2018 chest radiograph FINDINGS: Stable cardiac silhouette given projection and technique. Low lung volumes. Reticular and hazy opacities of the lungs. No focal consolidation. No pleural effusion or pneumothorax. Bones are unremarkable. IMPRESSION: Reticular and hazy opacities of the lungs probably representing interstitial and alveolar pulmonary edema. Electronically Signed   By: Kristine Garbe M.D.   On: 04/09/2018 18:20    Procedures Procedures (including critical care time)  Medications Ordered in ED Medications  furosemide (LASIX) injection 20 mg (has no administration in time range)     Initial Impression / Assessment and Plan / ED Course  I have reviewed the triage vital signs and the nursing notes.  Pertinent labs & imaging results that were available during my care of the patient were reviewed by me and considered in my medical decision making (see chart for details).     [name] is a 62 y.o. female with a past medical history significant for diabetes, CAD, atrial fibrillation not on anticoagulation due to prior GI bleed, and congestive heart failure on 4 L nasal cannula at baseline who presents with fatigue, worsening peripheral edema and shortness of breath.  Patient reports that her shortness of breath has been gradually worsening over the last 2 weeks.  She reports that for the last month she has not been taking her Lasix.  She reports that when she takes her Lasix she has to urinate and despite the fatigue and exertional shortness of breath, she would rather be short  of breath than urinate on herself around the floor.  She denies any fevers, chills, chest pain, congestion, cough, nausea, vomiting, conservation, diarrhea, dysuria or hematuria.  She denies any other symptoms aside from the worsening shortness of breath, fatigue, and peripheral edema in both legs.  She denies any pain at this time.  She says that EMS had to increase her oxygen to 6 L because she dropped into the low 80s on her 4 L when she was ambulating.  On exam, patient has crackles in all lung fields.  Patient's chest and back are nontender.  Abdomen is nontender.  Patient has peripheral edema in her legs which she had symmetric pulses in upper and lower extremities.  Patient otherwise appears well.  Patient appears to be in atrial fibrillation on telemetry.  I suspect CHF exacerbation due to the noncompliance with Lasix diuretic.  Patient will have screening laboratory testing to look for kidney injury or other causes of edema and fluid overload.  Patient will also have chest x-ray and BNP.  Patient will likely be given Lasix if her kidney function is reassuring.  Due to patient's increased oxygen requirement to 6 L and her fatigue, suspect patient will require admission for diuresis and management of CHF exacerbation.  8:14 PM Work-up confirmed evidence of fluid overload.  BNP more elevated than prior.  Chest x-ray shows evidence of edema.  Urinalysis had no nitrites, and absence of urinary symptoms doubt UTI.  Kidney function was normal and Lasix was ordered.  Due to increased oxygen requirement on 6 L and worsening fluid overload, patient will be admitted for further diuresis and management of CHF exacerbation.  Initial troponin was negative but will trend troponins.  Patient admitted for further management.  Final Clinical Impressions(s) / ED Diagnoses   Final diagnoses:  Hypoxia  SOB (  shortness of breath)  Acute on chronic congestive heart failure, unspecified heart failure type [facility])     ED Discharge Orders    None      Clinical Impression: 1. Hypoxia   2. SOB (shortness of breath)   3. Acute on chronic congestive heart failure, unspecified heart failure type ([place])     Disposition: Admit  This note was prepared with assistance of Dragon voice recognition software. Occasional wrong-word or sound-a-like substitutions may have occurred due to the inherent limitations of voice recognition software.     Thai Burgueno, Gwenyth Allegra, MD 04/09/18 [phone]

## 2018-04-10 ENCOUNTER — Inpatient Hospital Stay (HOSPITAL_COMMUNITY): Payer: Medicare Other

## 2018-04-10 LAB — BASIC METABOLIC PANEL
ANION GAP: 12 (ref 5–15)
BUN: 13 mg/dL (ref 6–20)
CO2: 38 mmol/L — ABNORMAL HIGH (ref 22–32)
Calcium: 8.5 mg/dL — ABNORMAL LOW (ref 8.9–10.3)
Chloride: 94 mmol/L — ABNORMAL LOW (ref 101–111)
Creatinine, Ser: 0.83 mg/dL (ref 0.44–1.00)
GFR calc Af Amer: >60 mL/min (ref >60)
GFR calc non Af Amer: >60 mL/min (ref >60)
Glucose, Bld: 138 mg/dL — ABNORMAL HIGH (ref 65–99)
POTASSIUM: 3.5 mmol/L (ref 3.5–5.1)
Sodium: 144 mmol/L (ref 135–145)

## 2018-04-10 LAB — ECHOCARDIOGRAM COMPLETE
Height: 65 in
Weight: 5086.45 oz

## 2018-04-10 LAB — MRSA PCR SCREENING: MRSA BY PCR: NEGATIVE

## 2018-04-10 LAB — TROPONIN I: Troponin I: <0.03 ng/mL (ref <0.03)

## 2018-04-10 MED ORDER — POTASSIUM CHLORIDE CRYS ER 10 MEQ PO TBCR
20.0000 meq | EXTENDED_RELEASE_TABLET | Freq: Three times a day (TID) | ORAL | Status: DC
  Administered 2018-04-10 – 2018-04-12 (×7): 20 meq via ORAL
  Filled 2018-04-10 (×7): qty 2

## 2018-04-10 NOTE — Progress Notes (Signed)
  Echocardiogram 2D Echocardiogram has been performed.  [name] 04/10/2018, 12:57 PM

## 2018-04-10 NOTE — Progress Notes (Signed)
Ref: Dixie Dials, MD   Subjective:  Mild respiratory distress. Afebrile. Atrial fibrillation. O2 sat 95 % on 2 L/min oxygen by nasal cannula.  Objective:  Vital Signs in the last 24 hours: Temp:  [98.2 F (36.8 C)-98.3 F (36.8 C)] 98.2 F (36.8 C) (05/01 0530) Pulse Rate:  [74-93] 93 (05/01 1309) Cardiac Rhythm: Atrial fibrillation (05/01 0700) Resp:  [12-22] 19 (05/01 1309) BP: (120-165)/(71-121) 120/71 (05/01 1309) SpO2:  [92 %-99 %] 95 % (05/01 1309) Weight:  [132 kg (291 lb)-146 kg (321 lb 14 oz)] 144.2 kg (317 lb 14.5 oz) (05/01 0600)  Physical Exam: BP Readings from Last 1 Encounters:  04/10/18 120/71     Wt Readings from Last 1 Encounters:  04/10/18 (!) 144.2 kg (317 lb 14.5 oz)    Weight change:  Body mass index is 52.9 kg/m. HEENT: [place]/AT, Eyes-Blue, PERL, EOMI, Conjunctiva-Pink, Sclera-Non-icteric. Wears glasses. Neck: Positive JVD, No bruit, Trachea midline. Lungs:  Clearing, Bilateral. Cardiac:  Regular rhythm, normal S1 and S2, no S3. II/VI systolic murmur. Abdomen:  Soft, non-tender. BS present. Extremities:  2 + edema present. No cyanosis. No clubbing. CNS: AxOx3, Cranial nerves grossly intact, moves all 4 extremities.  Skin: Warm and dry.   Intake/Output from previous day: 04/30 0701 - 05/01 0700 In: 0  Out: 4000 [Urine:4000]    Lab Results: BMET    Component Value Date/Time   NA 144 04/10/2018 0521   NA 142 04/09/2018 1821   NA 139 02/02/2018 0005   K 3.5 04/10/2018 0521   K 3.6 04/09/2018 1821   K 3.7 02/02/2018 0005   CL 94 (L) 04/10/2018 0521   CL 98 (L) 04/09/2018 1821   CL 98 (L) 02/02/2018 0005   CO2 38 (H) 04/10/2018 0521   CO2 34 (H) 04/09/2018 1821   CO2 31 02/02/2018 0005   GLUCOSE 138 (H) 04/10/2018 0521   GLUCOSE 134 (H) 04/09/2018 1821   GLUCOSE 141 (H) 02/02/2018 0005   BUN 13 04/10/2018 0521   BUN 14 04/09/2018 1821   BUN 20 02/02/2018 0005   CREATININE 0.83 04/10/2018 0521   CREATININE 0.86 04/09/2018 1821   CREATININE 1.05 (H) 02/02/2018 0005   CALCIUM 8.5 (L) 04/10/2018 0521   CALCIUM 8.4 (L) 04/09/2018 1821   CALCIUM 8.2 (L) 02/02/2018 0005   GFRNONAA >60 04/10/2018 0521   GFRNONAA >60 04/09/2018 1821   GFRNONAA 55 (L) 02/02/2018 0005   GFRAA >60 04/10/2018 0521   GFRAA >60 04/09/2018 1821   GFRAA >60 02/02/2018 0005   CBC    Component Value Date/Time   WBC 10.5 04/09/2018 1821   RBC 4.18 04/09/2018 1821   HGB 10.3 (L) 04/09/2018 1821   HCT 35.7 (L) 04/09/2018 1821   PLT 262 04/09/2018 1821   MCV 85.4 04/09/2018 1821   MCH 24.6 (L) 04/09/2018 1821   MCHC 28.9 (L) 04/09/2018 1821   RDW 15.4 04/09/2018 1821   LYMPHSABS 0.8 04/09/2018 1821   MONOABS 0.6 04/09/2018 1821   EOSABS 0.1 04/09/2018 1821   BASOSABS 0.0 04/09/2018 1821   HEPATIC Function Panel Recent Labs    08/26/17 0233 01/31/18 1629 04/09/18 1821  PROT 8.2* 7.0 7.7   HEMOGLOBIN A1C No components found for: HGA1C,  MPG CARDIAC ENZYMES Lab Results  Component Value Date   CKTOTAL 96 11/12/2007   CKMB 1.8 11/12/2007   TROPONINI <0.03 04/10/2018   TROPONINI <0.03 04/10/2018   TROPONINI <0.03 04/09/2018   BNP No results for input(s): PROBNP in the last 8760 hours.  TSH Recent Labs    07/02/17 1743 02/02/18 0005  TSH 3.802 1.412   CHOLESTEROL No results for input(s): CHOL in the last 8760 hours.  Scheduled Meds: . diltiazem  240 mg Oral Daily  . famotidine  20 mg Oral BID  . furosemide  40 mg Intravenous Q12H  . gabapentin  100 mg Oral BID  . heparin  5,000 Units Subcutaneous Q8H  . levothyroxine  75 mcg Oral QAC breakfast  . loratadine  10 mg Oral Daily  . mouth rinse  15 mL Mouth Rinse BID  . metFORMIN  500 mg Oral BID WC  . metoprolol tartrate  50 mg Oral BID  . potassium chloride  20 mEq Oral TID  . sertraline  100 mg Oral Daily  . sodium chloride flush  3 mL Intravenous Q12H   Continuous Infusions: . sodium chloride     PRN Meds:.sodium chloride, acetaminophen, prochlorperazine, sodium  chloride flush  Assessment/Plan: Acute on chronic left heart systolic failure CAD COPD Hypertension Hypothyroidism Morbid Obesity Sleep apnea  Continue IV lasix. Check TSH and Hgb A1C. Check echocardiogram.   LOS: 1 day    Dixie Dials  MD  04/10/2018, 2:25 PM

## 2018-04-11 LAB — BASIC METABOLIC PANEL
Anion gap: 15 (ref 5–15)
BUN: 20 mg/dL (ref 6–20)
CO2: 40 mmol/L — AB (ref 22–32)
Calcium: 8.7 mg/dL — ABNORMAL LOW (ref 8.9–10.3)
Chloride: 89 mmol/L — ABNORMAL LOW (ref 101–111)
Creatinine, Ser: 0.94 mg/dL (ref 0.44–1.00)
GFR calc Af Amer: >60 mL/min (ref >60)
GLUCOSE: 196 mg/dL — AB (ref 65–99)
Potassium: 3.9 mmol/L (ref 3.5–5.1)
Sodium: 144 mmol/L (ref 135–145)

## 2018-04-11 LAB — IRON AND TIBC
Iron: 28 ug/dL (ref 28–170)
SATURATION RATIOS: 7 % — AB (ref 10.4–31.8)
TIBC: 388 ug/dL (ref 250–450)
UIBC: 360 ug/dL

## 2018-04-11 LAB — URINE CULTURE

## 2018-04-11 LAB — TSH: TSH: 2.674 u[IU]/mL (ref 0.350–4.500)

## 2018-04-11 LAB — CBC
HEMATOCRIT: 39.6 % (ref 36.0–46.0)
HEMOGLOBIN: 11.5 g/dL — AB (ref 12.0–15.0)
MCH: 24.7 pg — ABNORMAL LOW (ref 26.0–34.0)
MCHC: 29 g/dL — ABNORMAL LOW (ref 30.0–36.0)
MCV: 85 fL (ref 78.0–100.0)
Platelets: 289 10*3/uL (ref 150–400)
RBC: 4.66 MIL/uL (ref 3.87–5.11)
RDW: 15.4 % (ref 11.5–15.5)
WBC: 7.6 10*3/uL (ref 4.0–10.5)

## 2018-04-11 LAB — GLUCOSE, CAPILLARY
GLUCOSE-CAPILLARY: 138 mg/dL — AB (ref 65–99)
GLUCOSE-CAPILLARY: 147 mg/dL — AB (ref 65–99)
GLUCOSE-CAPILLARY: 112 mg/dL — AB (ref 65–99)

## 2018-04-11 LAB — HEMOGLOBIN A1C
Hgb A1c MFr Bld: 6.4 % — ABNORMAL HIGH (ref 4.8–5.6)
Mean Plasma Glucose: 136.98 mg/dL

## 2018-04-11 MED ORDER — FUROSEMIDE 10 MG/ML IJ SOLN
40.0000 mg | Freq: Every day | INTRAMUSCULAR | Status: DC
  Administered 2018-04-12 – 2018-04-13 (×2): 40 mg via INTRAVENOUS
  Filled 2018-04-11 (×2): qty 4

## 2018-04-11 MED ORDER — GLIMEPIRIDE 1 MG PO TABS
1.0000 mg | ORAL_TABLET | Freq: Two times a day (BID) | ORAL | Status: DC
  Administered 2018-04-11 – 2018-04-15 (×8): 1 mg via ORAL
  Filled 2018-04-11 (×9): qty 1

## 2018-04-11 MED ORDER — INSULIN ASPART 100 UNIT/ML ~~LOC~~ SOLN
0.0000 [IU] | Freq: Three times a day (TID) | SUBCUTANEOUS | Status: DC
  Administered 2018-04-11 – 2018-04-12 (×3): 3 [IU] via SUBCUTANEOUS
  Administered 2018-04-12 – 2018-04-13 (×2): 4 [IU] via SUBCUTANEOUS
  Administered 2018-04-13 (×2): 3 [IU] via SUBCUTANEOUS
  Administered 2018-04-14 – 2018-04-15 (×2): 4 [IU] via SUBCUTANEOUS

## 2018-04-11 NOTE — Progress Notes (Signed)
Ref: Dixie Dials, MD   Subjective:  Awake. Breathing improving. Over 8 L negative fluid balance in 2 days but weight is up by 9 pounds. Stable Hgb A1C and TSH but elevated fasting blood sugars.   Objective:  Vital Signs in the last 24 hours: Temp:  [97.5 F (36.4 C)-98.3 F (36.8 C)] 97.5 F (36.4 C) (05/02 0416) Pulse Rate:  [69-93] 69 (05/02 0416) Cardiac Rhythm: Atrial fibrillation (05/02 0700) Resp:  [18-19] 19 (05/02 0416) BP: (120-142)/(71-86) 131/77 (05/02 0416) SpO2:  [95 %] 95 % (05/02 0416) Weight:  [136.5 kg (300 lb 14.9 oz)] 136.5 kg (300 lb 14.9 oz) (05/02 5409)  Physical Exam: BP Readings from Last 1 Encounters:  04/11/18 131/77     Wt Readings from Last 1 Encounters:  04/11/18 (!) 136.5 kg (300 lb 14.9 oz)    Weight change: 4.503 kg (9 lb 14.9 oz) Body mass index is 50.08 kg/m. HEENT: [place]/AT, Eyes-Blue, PERL, EOMI, Conjunctiva-Pink, Sclera-Non-icteric Neck: No JVD, No bruit, Trachea midline. Lungs:  Clearing, Bilateral. Cardiac:  Regular rhythm, normal S1 and S2, no S3. II/VI systolic murmur. Abdomen:  Soft, non-tender. BS present. Extremities:  1+ edema present. No cyanosis. No clubbing. CNS: AxOx3, Cranial nerves grossly intact, moves all 4 extremities.  Skin: Warm and dry.   Intake/Output from previous day: 05/01 0701 - 05/02 0700 In: 960 [P.O.:960] Out: 5700 [Urine:5700]    Lab Results: BMET    Component Value Date/Time   NA 144 04/11/2018 0518   NA 144 04/10/2018 0521   NA 142 04/09/2018 1821   K 3.9 04/11/2018 0518   K 3.5 04/10/2018 0521   K 3.6 04/09/2018 1821   CL 89 (L) 04/11/2018 0518   CL 94 (L) 04/10/2018 0521   CL 98 (L) 04/09/2018 1821   CO2 40 (H) 04/11/2018 0518   CO2 38 (H) 04/10/2018 0521   CO2 34 (H) 04/09/2018 1821   GLUCOSE 196 (H) 04/11/2018 0518   GLUCOSE 138 (H) 04/10/2018 0521   GLUCOSE 134 (H) 04/09/2018 1821   BUN 20 04/11/2018 0518   BUN 13 04/10/2018 0521   BUN 14 04/09/2018 1821   CREATININE 0.94  04/11/2018 0518   CREATININE 0.83 04/10/2018 0521   CREATININE 0.86 04/09/2018 1821   CALCIUM 8.7 (L) 04/11/2018 0518   CALCIUM 8.5 (L) 04/10/2018 0521   CALCIUM 8.4 (L) 04/09/2018 1821   GFRNONAA >60 04/11/2018 0518   GFRNONAA >60 04/10/2018 0521   GFRNONAA >60 04/09/2018 1821   GFRAA >60 04/11/2018 0518   GFRAA >60 04/10/2018 0521   GFRAA >60 04/09/2018 1821   CBC    Component Value Date/Time   WBC 7.6 04/11/2018 0518   RBC 4.66 04/11/2018 0518   HGB 11.5 (L) 04/11/2018 0518   HCT 39.6 04/11/2018 0518   PLT 289 04/11/2018 0518   MCV 85.0 04/11/2018 0518   MCH 24.7 (L) 04/11/2018 0518   MCHC 29.0 (L) 04/11/2018 0518   RDW 15.4 04/11/2018 0518   LYMPHSABS 0.8 04/09/2018 1821   MONOABS 0.6 04/09/2018 1821   EOSABS 0.1 04/09/2018 1821   BASOSABS 0.0 04/09/2018 1821   HEPATIC Function Panel Recent Labs    08/26/17 0233 01/31/18 1629 04/09/18 1821  PROT 8.2* 7.0 7.7   HEMOGLOBIN A1C No components found for: HGA1C,  MPG CARDIAC ENZYMES Lab Results  Component Value Date   CKTOTAL 96 11/12/2007   CKMB 1.8 11/12/2007   TROPONINI <0.03 04/10/2018   TROPONINI <0.03 04/10/2018   TROPONINI <0.03 04/09/2018   BNP  No results for input(s): PROBNP in the last 8760 hours. TSH Recent Labs    07/02/17 1743 02/02/18 0005 04/11/18 0518  TSH 3.802 1.412 2.674   CHOLESTEROL No results for input(s): CHOL in the last 8760 hours.  Scheduled Meds: . diltiazem  240 mg Oral Daily  . famotidine  20 mg Oral BID  . furosemide  40 mg Intravenous Q12H  . gabapentin  100 mg Oral BID  . glimepiride  1 mg Oral BID WC  . heparin  5,000 Units Subcutaneous Q8H  . insulin aspart  0-20 Units Subcutaneous TID WC  . levothyroxine  75 mcg Oral QAC breakfast  . loratadine  10 mg Oral Daily  . mouth rinse  15 mL Mouth Rinse BID  . metFORMIN  500 mg Oral BID WC  . metoprolol tartrate  50 mg Oral BID  . potassium chloride  20 mEq Oral TID  . sertraline  100 mg Oral Daily  . sodium  chloride flush  3 mL Intravenous Q12H   Continuous Infusions: . 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 daily or 1 mg twice daily. Decrease lasix to one daily. PT evaluation.   LOS: 2 days    Dixie Dials  MD  04/11/2018, 8:32 AM

## 2018-04-11 NOTE — Progress Notes (Signed)
   04/11/18 1000  Clinical Encounter Type  Visited With Patient  Visit Type Initial;Spiritual support  Referral From Nurse  Consult/Referral To Chaplain  Spiritual Encounters  Spiritual Needs Prayer   Responded to a SCC.  Patient sitting up in the bed coloring and stated the coloring helps reduce her stress.  She indicated that she is suppose to close on the sale of her home tomorrow and then will find a place to live with her granddaughter.  One daughter lives in the area and the other out of state.  She indicated she was feeling some better today, but has been in and out of the hospital.  Seems to have a positive attitude about finding a place and hopes to get better.  Will follow and support as needed. Chaplain Katherene Ponto

## 2018-04-12 LAB — GLUCOSE, CAPILLARY
GLUCOSE-CAPILLARY: 130 mg/dL — AB (ref 65–99)
Glucose-Capillary: 166 mg/dL — ABNORMAL HIGH (ref 65–99)
Glucose-Capillary: 122 mg/dL — ABNORMAL HIGH (ref 65–99)
Glucose-Capillary: 112 mg/dL — ABNORMAL HIGH (ref 65–99)

## 2018-04-12 LAB — BASIC METABOLIC PANEL
ANION GAP: 11 (ref 5–15)
BUN: 23 mg/dL — ABNORMAL HIGH (ref 6–20)
CHLORIDE: 90 mmol/L — AB (ref 101–111)
CO2: 41 mmol/L — AB (ref 22–32)
Calcium: 8.9 mg/dL (ref 8.9–10.3)
Creatinine, Ser: 1.08 mg/dL — ABNORMAL HIGH (ref 0.44–1.00)
GFR calc non Af Amer: 53 mL/min — ABNORMAL LOW (ref >60)
GLUCOSE: 139 mg/dL — AB (ref 65–99)
Potassium: 4.6 mmol/L (ref 3.5–5.1)
Sodium: 142 mmol/L (ref 135–145)

## 2018-04-12 LAB — BRAIN NATRIURETIC PEPTIDE: B NATRIURETIC PEPTIDE 5: 99.6 pg/mL (ref 0.0–100.0)

## 2018-04-12 MED ORDER — POTASSIUM CHLORIDE CRYS ER 20 MEQ PO TBCR
20.0000 meq | EXTENDED_RELEASE_TABLET | Freq: Every day | ORAL | Status: DC
  Administered 2018-04-13: 20 meq via ORAL
  Filled 2018-04-12 (×2): qty 1

## 2018-04-12 NOTE — Progress Notes (Signed)
Stool tested hemoccult negative.
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
## 2018-04-12 NOTE — Evaluation (Addendum)
Physical Therapy Evaluation Patient Details Name: [name] MRN: [id number] DOB: September 02, 1956 Today's Date: 04/12/2018   History of Present Illness  62 yo female admitted with acute on chronic heart failure. Hx of morbid obesity, DM, CHF, CAD, ADD, A fib  Clinical Impression  On eval, pt required Min assist for mobility. She walked ~20 feet with a RW. At baseline, pt reports only walking short distances (to/from bathroom with a RW). She reports she uses wheelchair mostly. Discussed d/c plan-pt states she will d/c to a motel at discharge (she is currently selling her home). She is agreeable to HHPT f/u. Will follow and progress activity as tolerated.     Follow Up Recommendations Home health PT; Intermittent Supervision/Assist    Equipment Recommendations  None recommended by PT    Recommendations for Other Services       Precautions / Restrictions Precautions Precautions: Fall Precaution Comments: O2 dep Restrictions Weight Bearing Restrictions: No      Mobility  Bed Mobility         Supine to sit: Modified independent (Device/Increase time)        Transfers Overall transfer level: Needs assistance Equipment used: Rolling walker (2 wheeled) Transfers: Sit to/from Stand Sit to Stand: Min assist         General transfer comment: Assist to rise from bed. Increased time.   Ambulation/Gait Ambulation/Gait assistance: Min guard Ambulation Distance (Feet): 20 Feet Assistive device: Rolling walker (2 wheeled) Gait Pattern/deviations: Step-through pattern     General Gait Details: Pt tolerated short distance well. No LOB with RW use. Slow gait speed. Remained on [place] O2-sats 89-90%. Pt fatigues easily. Distance is limited by fatigue and chronic knee pain  Stairs            Wheelchair Mobility    Modified Rankin (Stroke Patients Only)       Balance Overall balance assessment: Needs assistance         Standing balance support: Bilateral upper  extremity supported Standing balance-Leahy Scale: Poor                               Pertinent Vitals/Pain Pain Assessment: Faces Faces Pain Scale: Hurts little more Pain Location: chronic knee pain Pain Descriptors / Indicators: Aching;Sore Pain Intervention(s): Monitored during session    Home Living Family/patient expects to be discharged to:: Unsure     Type of Home: Mobile home         Home Equipment: Gilford Rile - 2 wheels;Wheelchair - manual      Prior Function Level of Independence: Independent with assistive device(s)         Comments: walks short distances with a RW. uses wheelchair for longer distances.      Hand Dominance        Extremity/Trunk Assessment   Upper Extremity Assessment Upper Extremity Assessment: Generalized weakness    Lower Extremity Assessment Lower Extremity Assessment: Generalized weakness    Cervical / Trunk Assessment Cervical / Trunk Assessment: Normal  Communication   Communication: No difficulties  Cognition Arousal/Alertness: Awake/alert Behavior During Therapy: WFL for tasks assessed/performed Overall Cognitive Status: Within Functional Limits for tasks assessed                                        General Comments      Exercises  Assessment/Plan    PT Assessment Patient needs continued PT services  PT Problem List Decreased balance;Decreased mobility;Decreased activity tolerance;Decreased strength       PT Treatment Interventions DME instruction;Gait training;Functional mobility training;Therapeutic activities;Balance training;Patient/family education;Therapeutic exercise    PT Goals (Current goals can be found in the Care Plan section)  Acute Rehab PT Goals Patient Stated Goal: home soon PT Goal Formulation: With patient Time For Goal Achievement: 04/26/18 Potential to Achieve Goals: Good    Frequency Min 3X/week   Barriers to discharge        Co-evaluation                AM-PAC PT "6 Clicks" Daily Activity  Outcome Measure Difficulty turning over in bed (including adjusting bedclothes, sheets and blankets)?: A Lot Difficulty moving from lying on back to sitting on the side of the bed? : A Little Difficulty sitting down on and standing up from a chair with arms (e.g., wheelchair, bedside commode, etc,.)?: Unable Help needed moving to and from a bed to chair (including a wheelchair)?: A Little Help needed walking in hospital room?: A Little Help needed climbing 3-5 steps with a railing? : Total 6 Click Score: 13    End of Session Equipment Utilized During Treatment: Gait belt;Oxygen Activity Tolerance: Patient tolerated treatment well Patient left: in chair;with call bell/phone within reach   PT Visit Diagnosis: Muscle weakness (generalized) (M62.81);Difficulty in walking, not elsewhere classified (R26.2)    Time: 1583-0940 PT Time Calculation (min) (ACUTE ONLY): 14 min   Charges:   PT Evaluation $PT Eval Moderate Complexity: 1 Mod     PT G Codes:          Weston Anna, MPT Pager: [phone]

## 2018-04-12 NOTE — Progress Notes (Signed)
Ref: Dixie Dials, MD   Subjective:  Significant weakness. Mild respiratory distress. On 4 L O2. T max 99 degree F. Very little ambulation. Weight discrepancy addressed by nurse.  Objective:  Vital Signs in the last 24 hours: Temp:  [97.9 F (36.6 C)-99 F (37.2 C)] 97.9 F (36.6 C) (05/03 0541) Pulse Rate:  [87-90] 88 (05/03 0541) Cardiac Rhythm: Atrial fibrillation (05/03 0700) Resp:  [18-19] 18 (05/03 0541) BP: (135-140)/(64-90) 140/90 (05/03 0541) SpO2:  [94 %-97 %] 97 % (05/03 0541) Weight:  [132.8 kg (292 lb 12.3 oz)] 132.8 kg (292 lb 12.3 oz) (05/03 0541)  Physical Exam: BP Readings from Last 1 Encounters:  04/12/18 140/90     Wt Readings from Last 1 Encounters:  04/12/18 132.8 kg (292 lb 12.3 oz)    Weight change: -2.689 kg (-5 lb 14.9 oz) Body mass index is 48.72 kg/m. HEENT: [place]/AT, Eyes-Brown, PERL, EOMI, Conjunctiva-Pink, Sclera-Non-icteric. Wears glasses. Neck: No JVD, No bruit, Trachea midline. Lungs:  Clearing, Bilateral. Cardiac:  Regular rhythm, normal S1 and S2, no S3. II/VI systolic murmur. Abdomen:  Soft, non-tender. BS present. Extremities:  Trace edema present. No cyanosis. No clubbing. CNS: AxOx3, Cranial nerves grossly intact, moves all 4 extremities.  Skin: Warm and dry.   Intake/Output from previous day: 05/02 0701 - 05/03 0700 In: 960 [P.O.:960] Out: 3400 [Urine:3400]    Lab Results: BMET    Component Value Date/Time   NA 142 04/12/2018 0536   NA 144 04/11/2018 0518   NA 144 04/10/2018 0521   K 4.6 04/12/2018 0536   K 3.9 04/11/2018 0518   K 3.5 04/10/2018 0521   CL 90 (L) 04/12/2018 0536   CL 89 (L) 04/11/2018 0518   CL 94 (L) 04/10/2018 0521   CO2 41 (H) 04/12/2018 0536   CO2 40 (H) 04/11/2018 0518   CO2 38 (H) 04/10/2018 0521   GLUCOSE 139 (H) 04/12/2018 0536   GLUCOSE 196 (H) 04/11/2018 0518   GLUCOSE 138 (H) 04/10/2018 0521   BUN 23 (H) 04/12/2018 0536   BUN 20 04/11/2018 0518   BUN 13 04/10/2018 0521   CREATININE 1.08  (H) 04/12/2018 0536   CREATININE 0.94 04/11/2018 0518   CREATININE 0.83 04/10/2018 0521   CALCIUM 8.9 04/12/2018 0536   CALCIUM 8.7 (L) 04/11/2018 0518   CALCIUM 8.5 (L) 04/10/2018 0521   GFRNONAA 53 (L) 04/12/2018 0536   GFRNONAA >60 04/11/2018 0518   GFRNONAA >60 04/10/2018 0521   GFRAA >60 04/12/2018 0536   GFRAA >60 04/11/2018 0518   GFRAA >60 04/10/2018 0521   CBC    Component Value Date/Time   WBC 7.6 04/11/2018 0518   RBC 4.66 04/11/2018 0518   HGB 11.5 (L) 04/11/2018 0518   HCT 39.6 04/11/2018 0518   PLT 289 04/11/2018 0518   MCV 85.0 04/11/2018 0518   MCH 24.7 (L) 04/11/2018 0518   MCHC 29.0 (L) 04/11/2018 0518   RDW 15.4 04/11/2018 0518   LYMPHSABS 0.8 04/09/2018 1821   MONOABS 0.6 04/09/2018 1821   EOSABS 0.1 04/09/2018 1821   BASOSABS 0.0 04/09/2018 1821   HEPATIC Function Panel Recent Labs    08/26/17 0233 01/31/18 1629 04/09/18 1821  PROT 8.2* 7.0 7.7   HEMOGLOBIN A1C No components found for: HGA1C,  MPG CARDIAC ENZYMES Lab Results  Component Value Date   CKTOTAL 96 11/12/2007   CKMB 1.8 11/12/2007   TROPONINI <0.03 04/10/2018   TROPONINI <0.03 04/10/2018   TROPONINI <0.03 04/09/2018   BNP No results for input(s):  PROBNP in the last 8760 hours. TSH Recent Labs    07/02/17 1743 02/02/18 0005 04/11/18 0518  TSH 3.802 1.412 2.674   CHOLESTEROL No results for input(s): CHOL in the last 8760 hours.  Scheduled Meds: . diltiazem  240 mg Oral Daily  . famotidine  20 mg Oral BID  . furosemide  40 mg Intravenous Daily  . gabapentin  100 mg Oral BID  . glimepiride  1 mg Oral BID WC  . heparin  5,000 Units Subcutaneous Q8H  . insulin aspart  0-20 Units Subcutaneous TID WC  . levothyroxine  75 mcg Oral QAC breakfast  . loratadine  10 mg Oral Daily  . mouth rinse  15 mL Mouth Rinse BID  . metFORMIN  500 mg Oral BID WC  . metoprolol tartrate  50 mg Oral BID  . [START ON 04/13/2018] potassium chloride  20 mEq Oral Daily  . sertraline  100 mg  Oral Daily  . sodium chloride flush  3 mL Intravenous Q12H   Continuous Infusions: . 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 Decrease oxygen to 3 L/min.   LOS: 3 days    Dixie Dials  MD  04/12/2018, 12:09 PM

## 2018-04-13 ENCOUNTER — Inpatient Hospital Stay (HOSPITAL_COMMUNITY): Payer: Medicare Other

## 2018-04-13 LAB — CBC WITH DIFFERENTIAL/PLATELET
Basophils Absolute: 0 10*3/uL (ref 0.0–0.1)
Basophils Relative: 0 %
EOS ABS: 0.1 10*3/uL (ref 0.0–0.7)
Eosinophils Relative: 1 %
HEMATOCRIT: 45.4 % (ref 36.0–46.0)
Hemoglobin: 13.4 g/dL (ref 12.0–15.0)
LYMPHS ABS: 1.5 10*3/uL (ref 0.7–4.0)
Lymphocytes Relative: 13 %
MCH: 25 pg — AB (ref 26.0–34.0)
MCHC: 29.5 g/dL — AB (ref 30.0–36.0)
MCV: 84.9 fL (ref 78.0–100.0)
MONO ABS: 1.1 10*3/uL — AB (ref 0.1–1.0)
MONOS PCT: 9 %
NEUTROS ABS: 9 10*3/uL — AB (ref 1.7–7.7)
NEUTROS PCT: 77 %
Platelets: 383 10*3/uL (ref 150–400)
RBC: 5.35 MIL/uL — ABNORMAL HIGH (ref 3.87–5.11)
RDW: 15.3 % (ref 11.5–15.5)
WBC: 11.7 10*3/uL — ABNORMAL HIGH (ref 4.0–10.5)

## 2018-04-13 LAB — GLUCOSE, CAPILLARY
Glucose-Capillary: 133 mg/dL — ABNORMAL HIGH (ref 65–99)
Glucose-Capillary: 170 mg/dL — ABNORMAL HIGH (ref 65–99)
Glucose-Capillary: 122 mg/dL — ABNORMAL HIGH (ref 65–99)
Glucose-Capillary: 147 mg/dL — ABNORMAL HIGH (ref 65–99)

## 2018-04-13 MED ORDER — FUROSEMIDE 40 MG PO TABS
40.0000 mg | ORAL_TABLET | Freq: Every day | ORAL | Status: DC
  Administered 2018-04-14: 40 mg via ORAL
  Filled 2018-04-13: qty 1

## 2018-04-13 NOTE — Progress Notes (Signed)
Ref: Dixie Dials, MD   Subjective:  Not ambulating as expected. Patient may need coaching in increasing activity. Afebrile.   Objective:  Vital Signs in the last 24 hours: Temp:  [97.4 F (36.3 C)-98.2 F (36.8 C)] 97.4 F (36.3 C) (05/04 0503) Pulse Rate:  [85-92] 89 (05/04 0503) Cardiac Rhythm: Atrial fibrillation (05/04 0700) Resp:  [18] 18 (05/04 0503) BP: (103-144)/(69-84) 107/84 (05/04 0503) SpO2:  [92 %-97 %] 95 % (05/04 0503) Weight:  [130.5 kg (287 lb 9.6 oz)] 130.5 kg (287 lb 9.6 oz) (05/04 0503)  Physical Exam: BP Readings from Last 1 Encounters:  04/13/18 107/84     Wt Readings from Last 1 Encounters:  04/13/18 130.5 kg (287 lb 9.6 oz)    Weight change: -3.357 kg (-7 lb 6.4 oz) Body mass index is 47.86 kg/m. HEENT: [place]/AT, Eyes-Blue, Conjunctiva-Pink, Sclera-Non-icteric. Wears glasses. Neck: No JVD, No bruit, Trachea midline. Lungs:  Clear, Bilateral. Cardiac:  Regular rhythm, normal S1 and S2, no S3. II/VI systolic murmur. Abdomen:  Soft, non-tender. BS present. Extremities:  Trace edema present. No cyanosis. No clubbing. CNS: AxOx3, Cranial nerves grossly intact, moves all 4 extremities.  Skin: Warm and dry.   Intake/Output from previous day: 05/03 0701 - 05/04 0700 In: 240 [P.O.:240] Out: 2900 [Urine:2900]    Lab Results: BMET    Component Value Date/Time   NA 142 04/12/2018 0536   NA 144 04/11/2018 0518   NA 144 04/10/2018 0521   K 4.6 04/12/2018 0536   K 3.9 04/11/2018 0518   K 3.5 04/10/2018 0521   CL 90 (L) 04/12/2018 0536   CL 89 (L) 04/11/2018 0518   CL 94 (L) 04/10/2018 0521   CO2 41 (H) 04/12/2018 0536   CO2 40 (H) 04/11/2018 0518   CO2 38 (H) 04/10/2018 0521   GLUCOSE 139 (H) 04/12/2018 0536   GLUCOSE 196 (H) 04/11/2018 0518   GLUCOSE 138 (H) 04/10/2018 0521   BUN 23 (H) 04/12/2018 0536   BUN 20 04/11/2018 0518   BUN 13 04/10/2018 0521   CREATININE 1.08 (H) 04/12/2018 0536   CREATININE 0.94 04/11/2018 0518   CREATININE 0.83  04/10/2018 0521   CALCIUM 8.9 04/12/2018 0536   CALCIUM 8.7 (L) 04/11/2018 0518   CALCIUM 8.5 (L) 04/10/2018 0521   GFRNONAA 53 (L) 04/12/2018 0536   GFRNONAA >60 04/11/2018 0518   GFRNONAA >60 04/10/2018 0521   GFRAA >60 04/12/2018 0536   GFRAA >60 04/11/2018 0518   GFRAA >60 04/10/2018 0521   CBC    Component Value Date/Time   WBC 7.6 04/11/2018 0518   RBC 4.66 04/11/2018 0518   HGB 11.5 (L) 04/11/2018 0518   HCT 39.6 04/11/2018 0518   PLT 289 04/11/2018 0518   MCV 85.0 04/11/2018 0518   MCH 24.7 (L) 04/11/2018 0518   MCHC 29.0 (L) 04/11/2018 0518   RDW 15.4 04/11/2018 0518   LYMPHSABS 0.8 04/09/2018 1821   MONOABS 0.6 04/09/2018 1821   EOSABS 0.1 04/09/2018 1821   BASOSABS 0.0 04/09/2018 1821   HEPATIC Function Panel Recent Labs    08/26/17 0233 01/31/18 1629 04/09/18 1821  PROT 8.2* 7.0 7.7   HEMOGLOBIN A1C No components found for: HGA1C,  MPG CARDIAC ENZYMES Lab Results  Component Value Date   CKTOTAL 96 11/12/2007   CKMB 1.8 11/12/2007   TROPONINI <0.03 04/10/2018   TROPONINI <0.03 04/10/2018   TROPONINI <0.03 04/09/2018   BNP No results for input(s): PROBNP in the last 8760 hours. TSH Recent Labs  07/02/17 1743 02/02/18 0005 04/11/18 0518  TSH 3.802 1.412 2.674   CHOLESTEROL No results for input(s): CHOL in the last 8760 hours.  Scheduled Meds: . diltiazem  240 mg Oral Daily  . famotidine  20 mg Oral BID  . furosemide  40 mg Intravenous Daily  . gabapentin  100 mg Oral BID  . glimepiride  1 mg Oral BID WC  . heparin  5,000 Units Subcutaneous Q8H  . insulin aspart  0-20 Units Subcutaneous TID WC  . levothyroxine  75 mcg Oral QAC breakfast  . loratadine  10 mg Oral Daily  . mouth rinse  15 mL Mouth Rinse BID  . metFORMIN  500 mg Oral BID WC  . metoprolol tartrate  50 mg Oral BID  . potassium chloride  20 mEq Oral Daily  . sertraline  100 mg Oral Daily  . sodium chloride flush  3 mL Intravenous Q12H   Continuous Infusions: . 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   LOS: 4 days    Dixie Dials  MD  04/13/2018, 12:25 PM

## 2018-04-14 ENCOUNTER — Encounter (HOSPITAL_COMMUNITY): Payer: Self-pay

## 2018-04-14 LAB — COMPREHENSIVE METABOLIC PANEL
ALK PHOS: 83 U/L (ref 38–126)
ALT: 16 U/L (ref 14–54)
ANION GAP: 11 (ref 5–15)
AST: 14 U/L — ABNORMAL LOW (ref 15–41)
Albumin: 3.4 g/dL — ABNORMAL LOW (ref 3.5–5.0)
BILIRUBIN TOTAL: 0.3 mg/dL (ref 0.3–1.2)
BUN: 32 mg/dL — ABNORMAL HIGH (ref 6–20)
CALCIUM: 9.4 mg/dL (ref 8.9–10.3)
CO2: 41 mmol/L — ABNORMAL HIGH (ref 22–32)
CREATININE: 1.16 mg/dL — AB (ref 0.44–1.00)
Chloride: 90 mmol/L — ABNORMAL LOW (ref 101–111)
GFR calc Af Amer: 56 mL/min — ABNORMAL LOW (ref >60)
GFR calc non Af Amer: 49 mL/min — ABNORMAL LOW (ref >60)
GLUCOSE: 124 mg/dL — AB (ref 65–99)
Potassium: 5.8 mmol/L — ABNORMAL HIGH (ref 3.5–5.1)
Sodium: 142 mmol/L (ref 135–145)
Total Protein: 8 g/dL (ref 6.5–8.1)

## 2018-04-14 LAB — GLUCOSE, CAPILLARY
GLUCOSE-CAPILLARY: 112 mg/dL — AB (ref 65–99)
Glucose-Capillary: 115 mg/dL — ABNORMAL HIGH (ref 65–99)
Glucose-Capillary: 159 mg/dL — ABNORMAL HIGH (ref 65–99)
Glucose-Capillary: 126 mg/dL — ABNORMAL HIGH (ref 65–99)

## 2018-04-14 MED ORDER — SODIUM POLYSTYRENE SULFONATE 15 GM/60ML PO SUSP
30.0000 g | Freq: Once | ORAL | Status: AC
  Administered 2018-04-14: 30 g via ORAL
  Filled 2018-04-14: qty 120

## 2018-04-14 MED ORDER — SODIUM CHLORIDE 0.9 % IV SOLN
INTRAVENOUS | Status: DC
  Administered 2018-04-14: 09:00:00 via INTRAVENOUS

## 2018-04-14 MED ORDER — POTASSIUM CHLORIDE 20 MEQ PO PACK: 20.0000 meq | PACK | Freq: Once | ORAL | Status: DC

## 2018-04-14 NOTE — Progress Notes (Signed)
Ref: Dixie Dials, MD   Subjective:  Slowly improving ambulation. Elevated potassium level. No chest pain or abdominal pain. Afebrile.  Objective:  Vital Signs in the last 24 hours: Temp:  [97.6 F (36.4 C)-98.1 F (36.7 C)] 97.6 F (36.4 C) (05/05 0611) Pulse Rate:  [77-82] 82 (05/05 0611) Cardiac Rhythm: Atrial fibrillation (05/05 0711) Resp:  [16-18] 18 (05/05 0611) BP: (134-146)/(88-90) 146/90 (05/05 0611) SpO2:  [95 %-98 %] 96 % (05/05 0611) Weight:  [127.3 kg (280 lb 10.3 oz)] 127.3 kg (280 lb 10.3 oz) (05/05 0600)  Physical Exam: BP Readings from Last 1 Encounters:  04/14/18 (!) 146/90     Wt Readings from Last 1 Encounters:  04/14/18 127.3 kg (280 lb 10.3 oz)    Weight change: -3.154 kg (-6 lb 15.3 oz) Body mass index is 46.7 kg/m. HEENT: /AT, Eyes-Blue, PERL, EOMI, Conjunctiva-Pink, Sclera-Non-icteric. Wears glasses. Neck: No JVD, No bruit, Trachea midline. Lungs:  Clear, Bilateral. Cardiac:  Regular rhythm, normal S1 and S2, no S3. II/VI systolic murmur. Abdomen:  Soft, non-tender. BS present. Extremities:  No edema present. No cyanosis. No clubbing. CNS: AxOx3, Cranial nerves grossly intact, moves all 4 extremities.  Skin: Warm and dry.   Intake/Output from previous day: 05/04 0701 - 05/05 0700 In: 1044 [P.O.:1044] Out: 1400 [Urine:1400]    Lab Results: BMET    Component Value Date/Time   NA 142 04/14/2018 0502   NA 142 04/12/2018 0536   NA 144 04/11/2018 0518   K 5.8 (H) 04/14/2018 0502   K 4.6 04/12/2018 0536   K 3.9 04/11/2018 0518   CL 90 (L) 04/14/2018 0502   CL 90 (L) 04/12/2018 0536   CL 89 (L) 04/11/2018 0518   CO2 41 (H) 04/14/2018 0502   CO2 41 (H) 04/12/2018 0536   CO2 40 (H) 04/11/2018 0518   GLUCOSE 124 (H) 04/14/2018 0502   GLUCOSE 139 (H) 04/12/2018 0536   GLUCOSE 196 (H) 04/11/2018 0518   BUN 32 (H) 04/14/2018 0502   BUN 23 (H) 04/12/2018 0536   BUN 20 04/11/2018 0518   CREATININE 1.16 (H) 04/14/2018 0502   CREATININE  1.08 (H) 04/12/2018 0536   CREATININE 0.94 04/11/2018 0518   CALCIUM 9.4 04/14/2018 0502   CALCIUM 8.9 04/12/2018 0536   CALCIUM 8.7 (L) 04/11/2018 0518   GFRNONAA 49 (L) 04/14/2018 0502   GFRNONAA 53 (L) 04/12/2018 0536   GFRNONAA >60 04/11/2018 0518   GFRAA 56 (L) 04/14/2018 0502   GFRAA >60 04/12/2018 0536   GFRAA >60 04/11/2018 0518   CBC    Component Value Date/Time   WBC 11.7 (H) 04/13/2018 1405   RBC 5.35 (H) 04/13/2018 1405   HGB 13.4 04/13/2018 1405   HCT 45.4 04/13/2018 1405   PLT 383 04/13/2018 1405   MCV 84.9 04/13/2018 1405   MCH 25.0 (L) 04/13/2018 1405   MCHC 29.5 (L) 04/13/2018 1405   RDW 15.3 04/13/2018 1405   LYMPHSABS 1.5 04/13/2018 1405   MONOABS 1.1 (H) 04/13/2018 1405   EOSABS 0.1 04/13/2018 1405   BASOSABS 0.0 04/13/2018 1405   HEPATIC Function Panel Recent Labs    01/31/18 1629 04/09/18 1821 04/14/18 0502  PROT 7.0 7.7 8.0   HEMOGLOBIN A1C No components found for: HGA1C,  MPG CARDIAC ENZYMES Lab Results  Component Value Date   CKTOTAL 96 11/12/2007   CKMB 1.8 11/12/2007   TROPONINI <0.03 04/10/2018   TROPONINI <0.03 04/10/2018   TROPONINI <0.03 04/09/2018   BNP No results for input(s): PROBNP in  the last 8760 hours. TSH Recent Labs    07/02/17 1743 02/02/18 0005 04/11/18 0518  TSH 3.802 1.412 2.674   CHOLESTEROL No results for input(s): CHOL in the last 8760 hours.  Scheduled Meds: . diltiazem  240 mg Oral Daily  . famotidine  20 mg Oral BID  . furosemide  40 mg Oral Daily  . gabapentin  100 mg Oral BID  . glimepiride  1 mg Oral BID WC  . heparin  5,000 Units Subcutaneous Q8H  . insulin aspart  0-20 Units Subcutaneous TID WC  . levothyroxine  75 mcg Oral QAC breakfast  . loratadine  10 mg Oral Daily  . mouth rinse  15 mL Mouth Rinse BID  . metFORMIN  500 mg Oral BID WC  . metoprolol tartrate  50 mg Oral BID  . sertraline  100 mg Oral Daily  . sodium chloride flush  3 mL Intravenous Q12H  . sodium polystyrene  30 g  Oral Once   Continuous Infusions: . sodium chloride    . sodium chloride 30 mL/hr at 04/14/18 0920   PRN Meds:.sodium chloride, acetaminophen, prochlorperazine, sodium chloride flush  Assessment/Plan: Acute on chronic left heart systolic failure Chronic atrial fibrillation CAD COPD Hypertension Hypothyroidism Morbid obesity Sleep apnea Weakness Abnormal gait Hyperkalemia  DC potassium supplement Add Kayexalate and small dose saline infusion.   LOS: 5 days    Dixie Dials  MD  04/14/2018, 12:45 PM

## 2018-04-15 LAB — LIPID PANEL
Cholesterol: 226 mg/dL — ABNORMAL HIGH (ref 0–200)
HDL: 31 mg/dL — ABNORMAL LOW (ref >40)
LDL Cholesterol: 134 mg/dL — ABNORMAL HIGH (ref 0–99)
Total CHOL/HDL Ratio: 7.3 RATIO
Triglycerides: 304 mg/dL — ABNORMAL HIGH (ref <150)
VLDL: 61 mg/dL — ABNORMAL HIGH (ref 0–40)

## 2018-04-15 LAB — BASIC METABOLIC PANEL
ANION GAP: 13 (ref 5–15)
BUN: 29 mg/dL — ABNORMAL HIGH (ref 6–20)
CHLORIDE: 93 mmol/L — AB (ref 101–111)
CO2: 33 mmol/L — ABNORMAL HIGH (ref 22–32)
CREATININE: 0.99 mg/dL (ref 0.44–1.00)
Calcium: 8.7 mg/dL — ABNORMAL LOW (ref 8.9–10.3)
GFR calc non Af Amer: 59 mL/min — ABNORMAL LOW (ref >60)
Glucose, Bld: 174 mg/dL — ABNORMAL HIGH (ref 65–99)
Potassium: 4.2 mmol/L (ref 3.5–5.1)
SODIUM: 139 mmol/L (ref 135–145)

## 2018-04-15 LAB — GLUCOSE, CAPILLARY: GLUCOSE-CAPILLARY: 173 mg/dL — AB (ref 65–99)

## 2018-04-15 MED ORDER — GLIMEPIRIDE 1 MG PO TABS: 1.0000 mg | ORAL_TABLET | Freq: Two times a day (BID) | ORAL | 3 refills | Status: DC

## 2018-04-15 NOTE — Care Management Note (Signed)
Case Management Note  Patient Details  Name: PAIGHTON GODETTE MRN: [id number] Date of Birth: 09-Jul-1956  Subjective/Objective: PT recc HHPT. MD ordered HHC-CHF protocal. Patient chose [facility] notified. Patient already has w/c,scale. No further CM needs.                   Action/Plan:d/c home w/HHC-CHF protocal.   Expected Discharge Date:  04/15/18               Expected Discharge Plan:  [place]  In-House Referral:     Discharge planning Services  CM Consult  Post Acute Care Choice:    Choice offered to:     DME Arranged:    DME Agency:     HH Arranged:  Disease Management, RN, PT, Nurse's Aide [place] Agency:  [facility]  Status of Service:  Completed, signed off  If discussed at [place] of Stay Meetings, dates discussed:    Additional Comments:  Dessa Phi, RN 04/15/2018, 10:38 AM

## 2018-04-15 NOTE — Discharge Summary (Signed)
Physician Discharge Summary  Patient ID: [name] MRN: [id number] DOB/AGE: Sep 02, 1956 62 y.o.  Admit date: 04/09/2018 Discharge date: 04/15/2018  Admission Diagnoses: Acute on chronic left heart systolic failure CAD COPD Hypertension Hypothyroidism Morbid obesity Sleep apnea  Discharge Diagnoses:  Principal problem: Acute on chronic systolic heart failure (HCC) Active Problems: Chronic atrial fibrillation, CHA2DSVASc score 5 CAD COPD Hypertension Hypothyroidism Morbid obesity Sleep apnea Weakness Abnormal gait H/O GI bleed H/O iron deficiency anemia Hyperkalemia, resolved  Discharged Condition: fair  Hospital Course: 62 year old female with past medical history of congestive heart failure, CAD, hypertension, GERD, hypothyroidism, morbid obesity and obstructive sleep apnea and increasing shortness of breath she admitted to drinking extra fluid and she had stopped her Lasix use for 1 month as she was unable to reach the bathroom to urinate in time her chest x-ray had shown no pulmonary edema and her BNP was elevated at 748.4 pg. She responded to IV Lasix.  Her activity was gradually increased.  She was reminded to follow heart failure booklet instructions. She is unable to tolerate antiplatelet medications and ace-inhibitors. Patient agreed to decrease her Subutex use as she was off here without any side effects or withdrawal. She will resume her Lasix use as needed.  She was discharged home in satisfactory condition with follow-up by me in 1 week.  Consults: cardiology  Significant Diagnostic Studies: labs: CBC was essentially unremarkable.  We met on admission was also near normal except slightly low chloride level and calcium level and blood glucose elevated at 134 mg.  Her BNP of 748.4 came down to 99.6 in 3 days of diuresis.  Her TSH was normal and her hemoglobin A1c was 6.4.  Troponin I was negative x 3. Iron level was low normal.  Treatments: cardiac meds:  metoprolol, diltiazem and furosemide.   Discharge Exam: Blood pressure (!) 127/96, pulse 78, temperature 97.8 F (36.6 C), temperature source Oral, resp. rate 20, height '5\' 5"'$  (1.651 m), weight 127.3 kg (280 lb 10.3 oz), SpO2 97 %. General appearance: alert, cooperative and appears stated age. Head: Normocephalic, atraumatic. Eyes: Blue eyes, pink conjunctiva, corneas clear. PERRL, EOM's intact.  Neck: No adenopathy, no carotid bruit, no JVD, supple, symmetrical, trachea midline and thyroid not enlarged. Resp: Clear to auscultation bilaterally. Cardio: Regular rate and rhythm, S1, S2 normal, II/VI systolic murmur, no click, rub or gallop. GI: Soft, non-tender; bowel sounds normal; no organomegaly. Extremities: No edema, cyanosis or clubbing. Skin: Warm and dry.  Neurologic: Alert and oriented X 3, normal strength and tone. Normal coordination and slow gait.  Disposition: Discharge disposition: 01-Home or Self Care        Allergies as of 04/15/2018      Reactions   Aspirin Other (See Comments)   Chest pain   Eliquis [apixaban]    Excessive bleeding   Nsaids Other (See Comments)   Chest pain   Other Other (See Comments)   "BP med" combined with plavix, caused syncope   Plavix [clopidogrel] Other (See Comments)   syncope   Sudafed [pseudoephedrine] Palpitations   Raised BP   Erythromycin Nausea And Vomiting   Sulfa Antibiotics Other (See Comments)   constipation   Sulfamethoxazole-trimethoprim Diarrhea   Altace [ramipril] Other (See Comments)   Severe fatigue   Zofran [ondansetron Hcl] Other (See Comments)   "I put me into a coma"      Medication List    STOP taking these medications   buprenorphine 8 MG Subl SL tablet Commonly known as:  SUBUTEX   potassium chloride 10 MEQ tablet Commonly known as:  K-DUR     TAKE these medications   cetirizine 10 MG tablet Commonly known as:  ZYRTEC Take 10 mg by mouth at bedtime.   diltiazem 240 MG 24 hr capsule Commonly  known as:  DILACOR XR Take 1 capsule (240 mg total) by mouth daily. Notes to patient:  Due tomorrow    furosemide 20 MG tablet Commonly known as:  LASIX Take 1 tablet (20 mg total) by mouth 2 (two) times daily.   gabapentin 100 MG capsule Commonly known as:  NEURONTIN Take 1 capsule (100 mg total) by mouth 2 (two) times daily.   glimepiride 1 MG tablet Commonly known as:  AMARYL Take 1 tablet (1 mg total) by mouth 2 (two) times daily with a meal.   metFORMIN 500 MG tablet Commonly known as:  GLUCOPHAGE Take 1 tablet (500 mg total) by mouth 2 (two) times daily with a meal.   metoprolol tartrate 50 MG tablet Commonly known as:  LOPRESSOR Take 1 tablet (50 mg total) by mouth 2 (two) times daily.   prochlorperazine 10 MG tablet Commonly known as:  COMPAZINE Take 1 tablet (10 mg total) by mouth every 6 (six) hours as needed for nausea or vomiting.   ranitidine 150 MG tablet Commonly known as:  ZANTAC Take 150 mg 2 (two) times daily by mouth. Notes to patient:  Continue home regimen    sertraline 100 MG tablet Commonly known as:  ZOLOFT Take 1 tablet (100 mg total) by mouth daily.   SYNTHROID 75 MCG tablet Generic drug:  levothyroxine Take 1 tablet (75 mcg total) by mouth daily before breakfast.      Follow-up Information    Dixie Dials, MD. Schedule an appointment as soon as possible for a visit in 1 week(s).   Specialty:  Cardiology Contact information: [place] [address] [phone]           Signed: Birdie Riddle 04/15/2018, 8:33 AM

## 2018-07-24 IMAGING — CR DG CHEST 2V
2 series · 2 of 2 positions shown · non-contrast
Comparison: 01/31/2018 chest radiograph

CLINICAL DATA: 62 y/o F; increased dyspnea on exertion for the past
few days.

EXAM:
CHEST - 2 VIEW

[w chest lat]
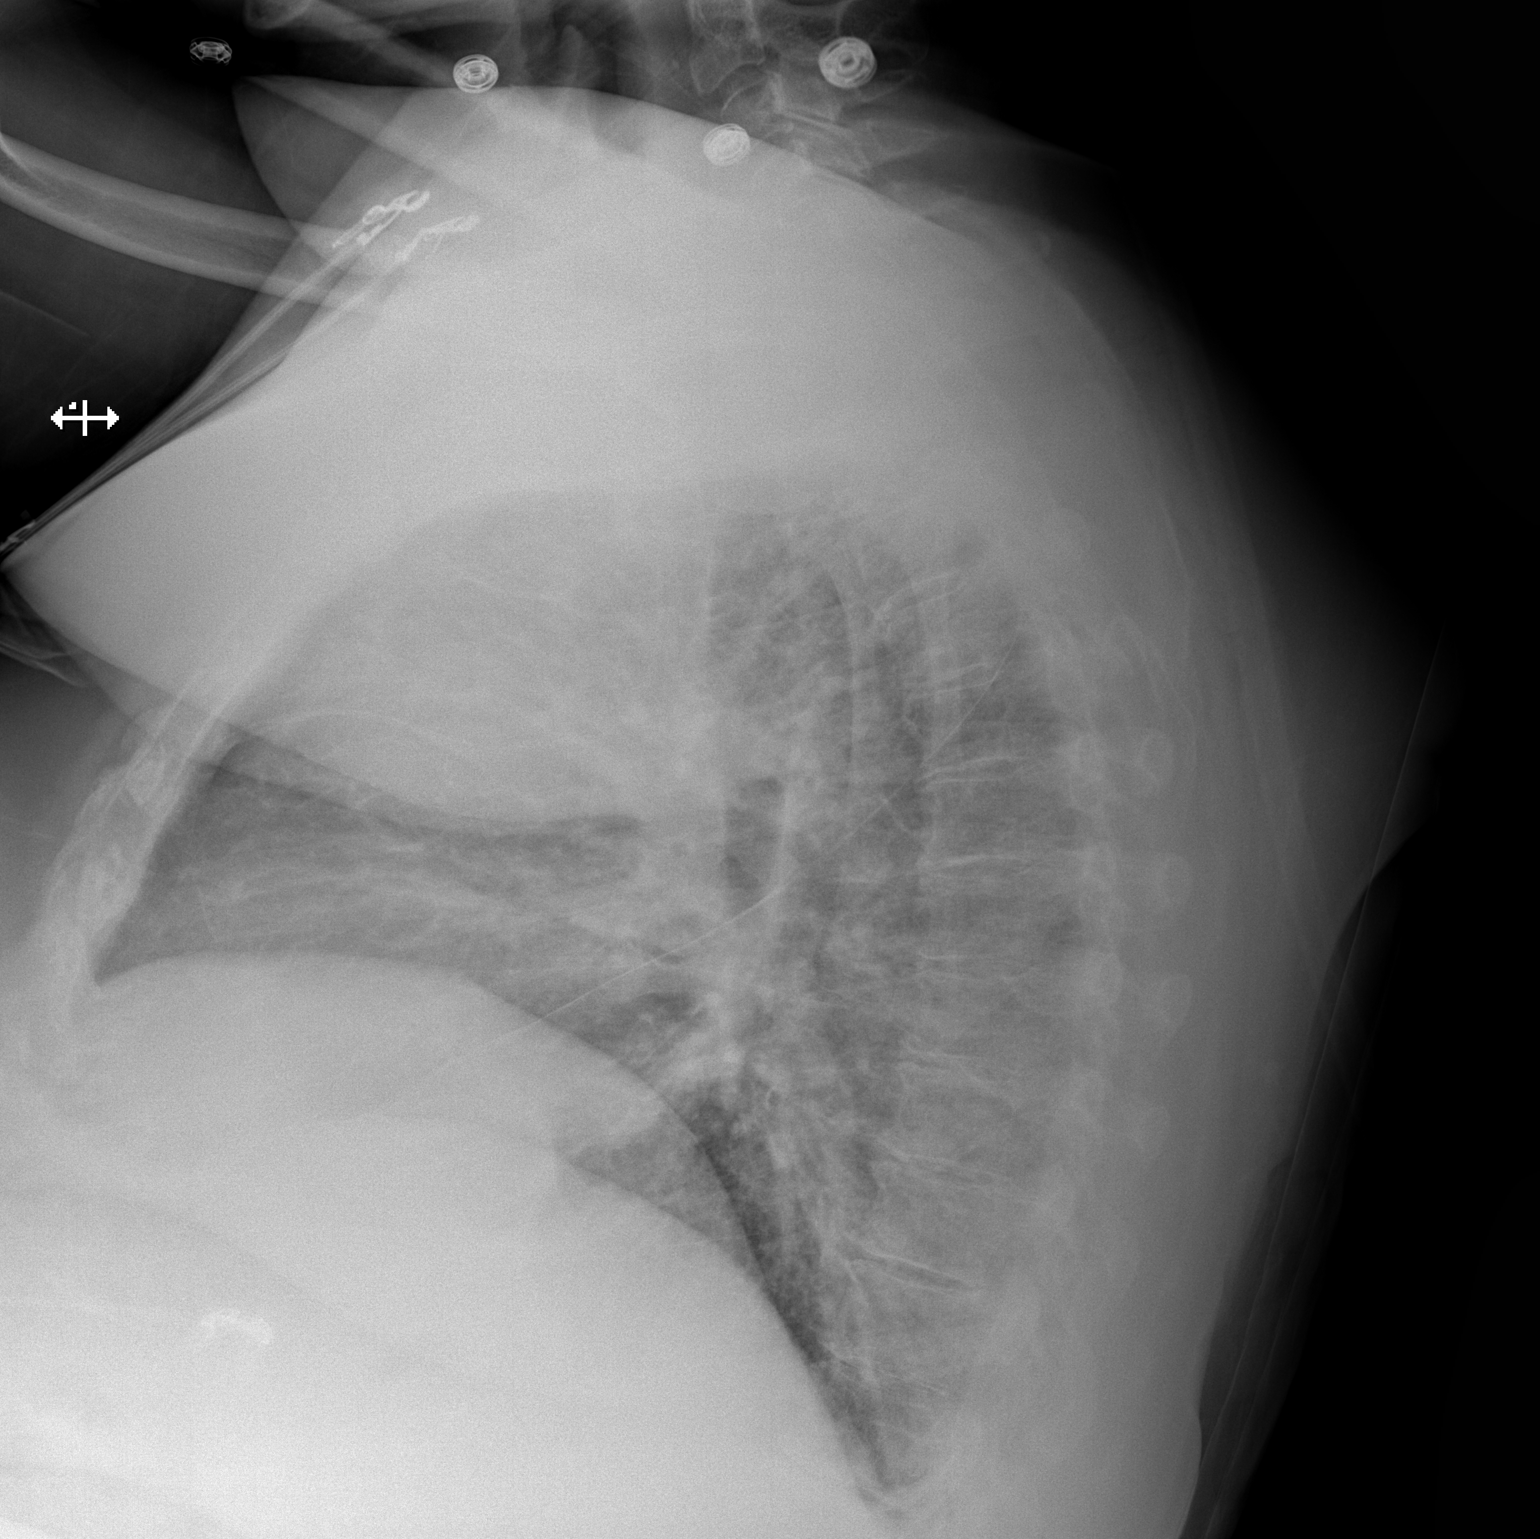

[x chest ap]
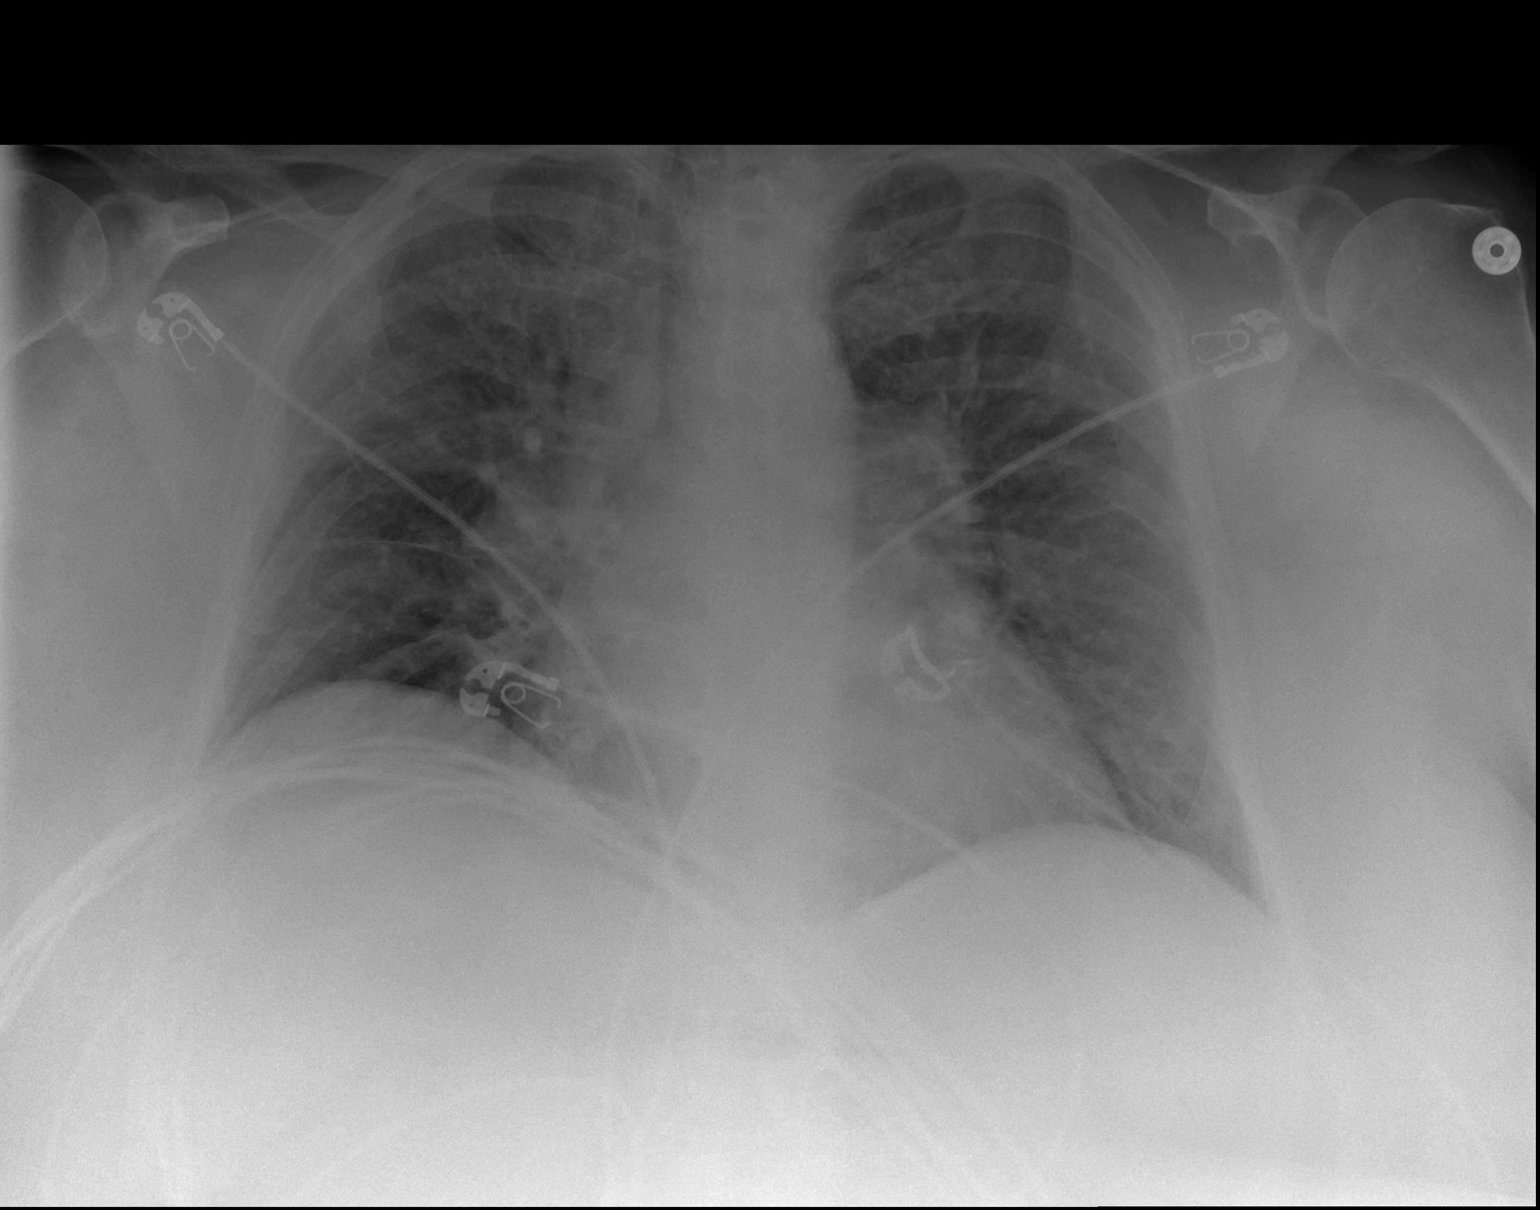

[2 of 2 positions shown; findings below may reference images not displayed]

FINDINGS: Stable cardiac silhouette given projection and technique. Low lung
volumes. Reticular and hazy opacities of the lungs. No focal
consolidation. No pleural effusion or pneumothorax. Bones are
unremarkable.
IMPRESSION: Reticular and hazy opacities of the lungs probably representing
interstitial and alveolar pulmonary edema.

By: Felipinho Brustolin M.D.

## 2019-12-22 ENCOUNTER — Ambulatory Visit: Payer: Medicare Other | Admitting: Pulmonary Disease

## 2019-12-30 ENCOUNTER — Encounter: Payer: Self-pay | Admitting: Pulmonary Disease

## 2019-12-30 ENCOUNTER — Ambulatory Visit (INDEPENDENT_AMBULATORY_CARE_PROVIDER_SITE_OTHER): Payer: Medicare HMO | Admitting: Pulmonary Disease

## 2019-12-30 ENCOUNTER — Other Ambulatory Visit: Payer: Self-pay

## 2019-12-30 VITALS — BP 128/76 | HR 80 | Temp 97.6°F | Ht 65.0 in | Wt 270.0 lb

## 2019-12-30 DIAGNOSIS — G4733 Obstructive sleep apnea (adult) (pediatric): Secondary | ICD-10-CM | POA: Diagnosis not present

## 2019-12-30 DIAGNOSIS — E662 Morbid (severe) obesity with alveolar hypoventilation: Secondary | ICD-10-CM | POA: Diagnosis not present

## 2019-12-30 DIAGNOSIS — J9612 Chronic respiratory failure with hypercapnia: Secondary | ICD-10-CM

## 2019-12-30 DIAGNOSIS — J9611 Chronic respiratory failure with hypoxia: Secondary | ICD-10-CM
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
## 2019-12-30 NOTE — Patient Instructions (Signed)
Will arrange for in lab sleep study Will call to arrange for follow up after sleep study reviewed  

## 2020-01-10 ENCOUNTER — Other Ambulatory Visit (HOSPITAL_COMMUNITY): Payer: Medicare HMO

## 2020-01-13 ENCOUNTER — Encounter (HOSPITAL_BASED_OUTPATIENT_CLINIC_OR_DEPARTMENT_OTHER): Payer: Medicare HMO | Admitting: Pulmonary Disease

## 2020-01-31 ENCOUNTER — Other Ambulatory Visit (HOSPITAL_COMMUNITY): Payer: Medicare HMO

## 2020-02-02 ENCOUNTER — Encounter (HOSPITAL_BASED_OUTPATIENT_CLINIC_OR_DEPARTMENT_OTHER): Payer: Medicare HMO | Admitting: Pulmonary Disease

## 2020-02-04 ENCOUNTER — Other Ambulatory Visit (HOSPITAL_COMMUNITY): Payer: Medicare HMO

## 2020-02-06 ENCOUNTER — Telehealth: Payer: Self-pay | Admitting: Pulmonary Disease

## 2020-02-06 DIAGNOSIS — J9611 Chronic respiratory failure with hypoxia: Secondary | ICD-10-CM

## 2020-02-06 NOTE — Telephone Encounter (Signed)
[name] from [facility] is returning phone call.  [name] phone number is [phone].

## 2020-02-06 NOTE — Telephone Encounter (Signed)
I called the patient back and she stated she was on the phone with [employer] talking to the rep about her supplies.   The patient stated during that conversation the rep with [employer] stated she needed to be recertified for her oxygen use at home.  The patient was last seen by Dr. Halford Chessman 12/30/19 for OSA, Chronic respiratory failure with hypoxia and hypercapnia and obesity hypoventilation syndrome.  The patient is in a wheelchair and cannot walk. She said the last time they wanted her to qualify for oxygen she was given an injection (?) in order to have the test done.  Based on the note from Dr. Halford Chessman, this patient needs to continue on 4L of O2.  LVM for Jonn Shingles with [employer] to see if the note from the office visit needs addendum or not.

## 2020-02-06 NOTE — Telephone Encounter (Signed)
Spoke with [name]- She states that she received msg that Hinton Dyer had left and understands that the pt is wheelchair bound and unable to do walk test. She has since reached out to the recert team and is now waiting on them to get back to her with update on what to do. Will await her call back.

## 2020-02-09 NOTE — Telephone Encounter (Signed)
Called and spoke to [place]. She states she spoke with the recert team and was advised the pt's office note from 12/30/19 needs amended to stated the pt's spO2 was 80% on room air, and recovered to 92% on 4lpm. [name] is also requesting an order to have in their system that pt is on 4lpm.   Dr. Halford Chessman please advise when you have amended the note. Thank you!

## 2020-02-09 NOTE — Telephone Encounter (Signed)
Addendum written to note, and order signed.

## 2020-02-09 NOTE — Telephone Encounter (Signed)
Called back [facility] and spoke with [name]. Made her aware of addendum made and order being signed. She stated she would look up the related information. Nothing further needed at this time.

## 2020-02-10 ENCOUNTER — Telehealth: Payer: Self-pay | Admitting: Pulmonary Disease

## 2020-02-10 DIAGNOSIS — J9612 Chronic respiratory failure with hypercapnia: Secondary | ICD-10-CM

## 2020-02-10 DIAGNOSIS — J9611 Chronic respiratory failure with hypoxia: Secondary | ICD-10-CM

## 2020-02-10 NOTE — Telephone Encounter (Signed)
Order fixed into format.

## 2020-02-10 NOTE — Telephone Encounter (Signed)
Response from Dimas Chyle w/ [facility] for O2 order:  Elon Alas sent to Harland Dingwall, [name]; Marlane Mingle, this is for an O2 re-certification and the Rx is not in the proper format for Medicare to accept.  Please ask the provider to add O2 type, frequency (Home O2 concentrator w/homefill via nasal canula), LPM w/activty and LON.  Please advise when this revised Rx is available and I will pull it.  Thank you  [name]

## 2020-02-10 NOTE — Telephone Encounter (Signed)
Will send new order to [facility] once signed.

## 2020-03-03 ENCOUNTER — Other Ambulatory Visit (HOSPITAL_COMMUNITY)
Admission: RE | Admit: 2020-03-03 | Discharge: 2020-03-03 | Disposition: A | Discharge status: Home / Self Care | Payer: Medicare HMO | Source: Ambulatory Visit | Attending: Pulmonary Disease | Admitting: Pulmonary Disease

## 2020-03-03 DIAGNOSIS — J9612 Chronic respiratory failure with hypercapnia: Secondary | ICD-10-CM | POA: Diagnosis not present

## 2020-03-03 DIAGNOSIS — Z6841 Body Mass Index (BMI) 40.0 and over, adult: Secondary | ICD-10-CM | POA: Diagnosis not present

## 2020-03-03 DIAGNOSIS — Z01812 Encounter for preprocedural laboratory examination: Secondary | ICD-10-CM | POA: Diagnosis not present

## 2020-03-03 DIAGNOSIS — J9611 Chronic respiratory failure with hypoxia: Secondary | ICD-10-CM | POA: Insufficient documentation

## 2020-03-03 DIAGNOSIS — Z20822 Contact with and (suspected) exposure to covid-19: Secondary | ICD-10-CM | POA: Diagnosis not present

## 2020-03-03 DIAGNOSIS — E662 Morbid (severe) obesity with alveolar hypoventilation: Secondary | ICD-10-CM | POA: Insufficient documentation

## 2020-03-03 DIAGNOSIS — G4733 Obstructive sleep apnea (adult) (pediatric): Secondary | ICD-10-CM | POA: Diagnosis present

## 2020-03-03 LAB — SARS CORONAVIRUS 2 (TAT 6-24 HRS): SARS Coronavirus 2: NEGATIVE

## 2020-03-05 ENCOUNTER — Ambulatory Visit (HOSPITAL_BASED_OUTPATIENT_CLINIC_OR_DEPARTMENT_OTHER): Payer: Medicare HMO | Admitting: Pulmonary Disease

## 2020-03-05 DIAGNOSIS — J9612 Chronic respiratory failure with hypercapnia: Secondary | ICD-10-CM

## 2020-03-05 DIAGNOSIS — E662 Morbid (severe) obesity with alveolar hypoventilation: Secondary | ICD-10-CM | POA: Diagnosis not present

## 2020-03-05 DIAGNOSIS — J9611 Chronic respiratory failure with hypoxia: Secondary | ICD-10-CM

## 2020-03-08 ENCOUNTER — Ambulatory Visit (INDEPENDENT_AMBULATORY_CARE_PROVIDER_SITE_OTHER): Payer: Medicare HMO | Admitting: Primary Care

## 2020-03-08 ENCOUNTER — Encounter: Payer: Self-pay | Admitting: Primary Care

## 2020-03-08 ENCOUNTER — Other Ambulatory Visit: Payer: Self-pay

## 2020-03-08 DIAGNOSIS — G4733 Obstructive sleep apnea (adult) (pediatric): Secondary | ICD-10-CM

## 2020-03-08 DIAGNOSIS — J9611 Chronic respiratory failure with hypoxia: Secondary | ICD-10-CM

## 2020-03-08 NOTE — Assessment & Plan Note (Signed)
-   Stable - Maintained on prn albuterol hfa and zyrtec daily

## 2020-03-08 NOTE — Assessment & Plan Note (Signed)
-   Patient completed split night sleep study on Friday 3/26 - Will follow-up with Dr. Halford Chessman after finalized results

## 2020-03-08 NOTE — Patient Instructions (Signed)
Pleasure seeing you today [name]  Orders: Renew oxygen 4L with [facility]  Follow-up: With Dr. Halford Chessman after sleep study    Home Oxygen Use, Adult When a medical condition keeps you from getting enough oxygen, your health care provider may instruct you to take extra oxygen at home. Your health care provider will let you know:  When to take oxygen.  For how long to take oxygen.  How quickly oxygen should be delivered (flow rate), in liters per minute (LPM or L/M). Home oxygen can be given through:  A mask.  A nasal cannula. This is a device or tube that goes in the nostrils.  A transtracheal catheter. This is a small, flexible tube placed in the trachea.  A tracheostomy. This is a surgically made opening in the trachea. These devices are connected with tubing to an oxygen source, such as:  A tank. Tanks hold oxygen in gas form. They must be replaced when the oxygen is used up.  A liquid oxygen device. This holds oxygen in liquid form. It must be replaced when the oxygen is used up.  An oxygen concentrator machine. This filters oxygen in the room. It uses electricity, so you must have a backup cylinder of oxygen in case the power goes out. Supplies needed: To use oxygen, you will need:  A mask, nasal cannula, transtracheal catheter, or tracheostomy.  An oxygen tank, a liquid oxygen device, or an oxygen concentrator.  The tape that your health care provider recommends (optional). If you use a transtracheal catheter and your prescribed flow rate is 1 LPM or greater, you will also need a humidifier. Risks and complications  Fire. This can happen if the oxygen is exposed to a heat source, flame, or spark.  Injury to skin. This can happen if liquid oxygen touches your skin.  Organ damage. This can happen if you get too little oxygen. How to use oxygen Your health care provider or a representative from your [facility] will show you how to use your oxygen device.  Follow her or his instructions. The instructions may look something like this: 1. Wash your hands. 2. If you use an oxygen concentrator, make sure it is plugged in. 3. Place one end of the tube into the port on the tank, device, or machine. 4. Place the mask over your nose and mouth. Or, place the nasal cannula and secure it with tape if instructed. If you use a tracheostomy or transtracheal catheter, connect it to the oxygen source as directed. 5. Make sure the liter-flow setting on the machine is at the level prescribed by your health care provider. 6. Turn on the machine or adjust the knob on the tank or device to the correct liter-flow setting. 7. When you are done, turn off and unplug the machine, or turn the knob to OFF. How to clean and care for the oxygen supplies Nasal cannula  Clean it with a warm, wet cloth daily or as needed.  Wash it with a liquid soap once a week.  Rinse it thoroughly once or twice a week.  Replace it every 2-4 weeks.  If you have an infection, such as a cold or pneumonia, change the cannula when you get better. Mask  Replace it every 2-4 weeks.  If you have an infection, such as a cold or pneumonia, change the mask when you get better. Humidifier bottle  Wash the bottle between each refill: ? Wash it with soap and warm water. ? Rinse it  thoroughly. ? Disinfect it and its top. ? Air-dry it.  Make sure it is dry before you refill it. Oxygen concentrator  Clean the air filter at least twice a week according to directions from your home medical equipment and service company.  Wipe down the cabinet every day. To do this: ? Unplug the unit. ? Wipe down the cabinet with a damp cloth. ? Dry the cabinet. Other equipment  Change any extra tubing every 1-3 months.  Follow instructions from your health care provider about taking care of any other equipment. Safety tips Fire safety tips   Keep your oxygen and oxygen supplies at least 5 ft away  from sources of heat, flames, and sparks at all times.  Do not allow smoking near your oxygen. Put up "no smoking" signs in your home. Avoid smoking areas when in public.  Do not use materials that can burn (are flammable) while you use oxygen.  When you go to a restaurant with portable oxygen, ask to be seated in the nonsmoking section.  Keep a Data processing manager close by. Let your fire department know that you have oxygen in your home.  Test your home smoke detectors regularly. Traveling  Secure your oxygen tank in the vehicle so that it does not move around. Follow instructions from your medical device company about how to safely secure your tank.  Make sure you have enough oxygen for the amount of time you will be away from home.  If you are planning air travel, contact the airline to find out if they allow the use of an approved portable oxygen concentrator. You may also need documents from your health care provider and medical device company before you travel. General safety tips  If you use an oxygen cylinder, make sure it is in a stand or secured to an object that will not move (fixed object).  If you use liquid oxygen, make sure its container is kept upright.  If you use an oxygen concentrator: ? Dance movement psychotherapist company. Make sure you are given priority service in the event that your power goes out. ? Avoid using extension cords, if possible. Follow these instructions at home:  Use oxygen only as told by your health care provider.  Do not use alcohol or other drugs that make you relax (sedating drugs) unless instructed. They can slow down your breathing rate and make it hard to get in enough oxygen.  Know how and when to order a refill of oxygen.  Always keep a spare tank of oxygen. Plan ahead for holidays when you may not be able to get a prescription filled.  Use water-based lubricants on your lips or nostrils. Do not use oil-based products like petroleum  jelly.  To prevent skin irritation on your cheeks or behind your ears, tuck some gauze under the tubing. Contact a health care provider if:  You get headaches often.  You have shortness of breath.  You have a lasting cough.  You have anxiety.  You are sleepy all the time.  You develop an illness that affects your breathing.  You cannot exercise at your regular level.  You are restless.  You have difficult or irregular breathing, and it is getting worse.  You have a fever.  You have persistent redness under your nose. Get help right away if:  You are confused.  You have blue lips or fingernails.  You are struggling to breathe. Summary  Your health care provider or a representative from your medical  device company will show you how to use your oxygen device. Follow her or his instructions.  If you use an oxygen concentrator, make sure it is plugged in.  Make sure the liter-flow setting on the machine is at the level prescribed by your health care provider.  Keep your oxygen and oxygen supplies at least 5 ft away from sources of heat, flames, and sparks at all times. This information is not intended to replace advice given to you by your health care provider. Make sure you discuss any questions you have with your health care provider. Document Revised: 05/16/2018 Document Reviewed: 06/20/2016 Elsevier Patient Education  [place].

## 2020-03-08 NOTE — Progress Notes (Signed)
@Patient  ID: [name], female    DOB: 1956/04/27, 64 y.o.   MRN: [id number]  Chief Complaint  Patient presents with  . Follow-up    Referring provider: No ref. provider found  HPI: 64 year old female, former smoker quit in 1975.  Past medical history significant for COPD, obesity hypoventilation syndrome, obstructive sleep apnea (no currently on bipap), chronic respiratory failure with hypoxia, asthma, hypertension, systolic and diastolic heart failure, A. fib, GERD. Patient of Dr. Halford Chessman, last seen on 12/30/19. Ordered for in-lab sleep study.  03/08/2020 Patient presents today for office visit to qualify for oxygen. Health wise she is feeling well. No acute complaints. She uses 4L oxygen 24/7 at home. DME company at [facility]. She has pulsed concentrator with her today. She had a split night sleep study done three days ago, results are still pending. States that she has to go back for titration. She continues to work on weight loss, she has lost 40 lbs over the last couple of years.   Cardiac testing: Echo 04/10/18 >> mild LVH, EF 50 to 55%, mild MR, PAS 54 mmHg  Sleep tests: PSG 03/26/12 >> AHI 36.6, SpO2 low 91%. Test done with 2 liters oxygen. PLMI 3.1. PVC's, bigeminy. BiPAP 05/23/12 to 07/08/12 >> Used on 47 of 47 nights with average 4 hrs 48 min. Average AHI 11 with BiPAP 13/9 cm H2O.  Allergies  Allergen Reactions  . Aspirin Other (See Comments)    Chest pain  . Eliquis [Apixaban]     Excessive bleeding  . Nsaids Other (See Comments)    Chest pain  . Other Other (See Comments)    "BP med" combined with plavix, caused syncope  . Plavix [Clopidogrel] Other (See Comments)    syncope  . Sudafed [Pseudoephedrine] Palpitations    Raised BP  . Erythromycin Nausea And Vomiting  . Sulfa Antibiotics Other (See Comments)    constipation  . Sulfamethoxazole-Trimethoprim Diarrhea  . Altace [Ramipril] Other (See Comments)    Severe fatigue  . Zofran [Ondansetron Hcl] Other  (See Comments)    "I put me into a coma"    Immunization History  Administered Date(s) Administered  . Influenza Split 10/10/2011  . Influenza, High Dose Seasonal PF 07/03/2019  . Influenza,inj,Quad PF,6+ Mos 11/09/2014, 10/29/2015, 02/01/2018  . PPD Test 08/30/2017  . Pneumococcal Polysaccharide-23 10/10/2011, 01/01/2016, 06/13/2019  . Tdap 07/02/2017    Past Medical History:  Diagnosis Date  . Acute and chronic respiratory failure with hypercapnia ([place])   . ADD (attention deficit disorder)   . Altered mental status    2nd to hypercapnea  . CHF (congestive heart failure) ([place])   . Chronic low back pain   . Complication of anesthesia    " I AM SLOW TO WAKE UP AT [facility] "  . Coronary artery disease   . Depression   . Diabetes mellitus   . Endometrial cancer ([place])   . GERD (gastroesophageal reflux disease)   . Hypercapnia   . Hypertension   . Hypothyroidism   . Hypoxemia   . Morbid obesity ([place])   . Nephrolithiasis   . OSA (obstructive sleep apnea)   . Paroxysmal atrial fibrillation (H[facility])   . Restless leg syndrome     Tobacco History: Social History   Tobacco Use  Smoking Status Former Smoker  . Packs/day: 0.50  . Years: 2.00  . Pack years: 1.00  . Types: Cigarettes  . Quit date: 09/10/1974  . Years since quitting: 45.5  Smokeless Tobacco Never  Used  Tobacco Comment   quit in teenage years   Counseling given: Not Answered Comment: quit in teenage years   Outpatient Medications Prior to Visit  Medication Sig Dispense Refill  . albuterol (VENTOLIN HFA) 108 (90 Base) MCG/ACT inhaler Inhale into the lungs.    Marland Kitchen azelastine (OPTIVAR) 0.05 % ophthalmic solution Place 1 drop into both eyes 2 (two) times daily as needed (dry eye irritation).    . buprenorphine (SUBUTEX) 8 MG SUBL SL tablet Place under the tongue.    . calcium carbonate (TUMS - DOSED IN MG ELEMENTAL CALCIUM) 500 MG chewable tablet Chew by mouth.    . carvedilol (COREG) 3.125 MG tablet Take by mouth.     . cetirizine (ZYRTEC) 10 MG tablet Take 10 mg by mouth at bedtime.    Marland Kitchen diltiazem (TIAZAC) 360 MG 24 hr capsule Take 1 capsule by mouth daily.    Marland Kitchen gabapentin (NEURONTIN) 100 MG capsule Take 1 capsule (100 mg total) by mouth 2 (two) times daily. 60 capsule 0  . levothyroxine (SYNTHROID) 88 MCG tablet Take 88 mcg by mouth daily before breakfast.    . metFORMIN (GLUCOPHAGE) 500 MG tablet Take 1 tablet (500 mg total) by mouth 2 (two) times daily with a meal. 60 tablet 2  . metoprolol tartrate (LOPRESSOR) 50 MG tablet Take 1 tablet (50 mg total) by mouth 2 (two) times daily. 60 tablet 2  . nitrofurantoin, macrocrystal-monohydrate, (MACROBID) 100 MG capsule TAKE 1 CAPSULE(100 MG) BY MOUTH EVERY NIGHT    . omeprazole (PRILOSEC) 20 MG capsule Take 1 capsule by mouth daily.    . potassium citrate (UROCIT-K) 10 MEQ (1080 MG) SR tablet Take by mouth.    . prochlorperazine (COMPAZINE) 10 MG tablet Take 1 tablet (10 mg total) by mouth every 6 (six) hours as needed for nausea or vomiting. 30 tablet 0  . sertraline (ZOLOFT) 100 MG tablet Take 1 tablet (100 mg total) by mouth daily. 30 tablet 0   No facility-administered medications prior to visit.    Review of Systems  Review of Systems  Constitutional: Negative.   Respiratory: Negative.   Cardiovascular: Negative.    Physical Exam  BP 126/66 (BP Location: Left Arm, Cuff Size: Normal)   Pulse 79   Temp (!) 97.5 F (36.4 C) (Temporal)   Ht 5\' 5"  (1.651 m)   Wt 280 lb (127 kg)   SpO2 92% Comment: 4 liters pulse  BMI 46.59 kg/m  Physical Exam Constitutional:      Appearance: Normal appearance. She is not ill-appearing.  HENT:     Head: Normocephalic and atraumatic.     Mouth/Throat:     Comments: Deferred d/t masking Cardiovascular:     Rate and Rhythm: Normal rate.  Pulmonary:     Effort: Pulmonary effort is normal. No respiratory distress.     Breath sounds: No wheezing.  Musculoskeletal:     Comments: In WC  Skin:    General:  Skin is warm and dry.  Neurological:     Mental Status: She is alert.  Psychiatric:        Mood and Affect: Mood normal.        Behavior: Behavior normal.        Thought Content: Thought content normal.        Judgment: Judgment normal.      Lab Results:  CBC    Component Value Date/Time   WBC 11.7 (H) 04/13/2018 1405   RBC 5.35 (H) 04/13/2018 1405  HGB 13.4 04/13/2018 1405   HCT 45.4 04/13/2018 1405   PLT 383 04/13/2018 1405   MCV 84.9 04/13/2018 1405   MCH 25.0 (L) 04/13/2018 1405   MCHC 29.5 (L) 04/13/2018 1405   RDW 15.3 04/13/2018 1405   LYMPHSABS 1.5 04/13/2018 1405   MONOABS 1.1 (H) 04/13/2018 1405   EOSABS 0.1 04/13/2018 1405   BASOSABS 0.0 04/13/2018 1405    BMET    Component Value Date/Time   NA 139 04/15/2018 0536   K 4.2 04/15/2018 0536   CL 93 (L) 04/15/2018 0536   CO2 33 (H) 04/15/2018 0536   GLUCOSE 174 (H) 04/15/2018 0536   BUN 29 (H) 04/15/2018 0536   CREATININE 0.99 04/15/2018 0536   CALCIUM 8.7 (L) 04/15/2018 0536   GFRNONAA 59 (L) 04/15/2018 0536   GFRAA >60 04/15/2018 0536    BNP    Component Value Date/Time   BNP 99.6 04/12/2018 0536    ProBNP    Component Value Date/Time   PROBNP 2,113.0 (H) 11/08/2014 1917    Imaging: No results found.   Assessment & Plan:   Chronic respiratory failure with hypoxia (HCC) - Stable  - Patient re-qualified for oxygen at 4L continuous  - Renew oxygen order with DME company [facility]  Asthma - Stable - Maintained on prn albuterol hfa and zyrtec daily   OSA (obstructive sleep apnea) - Patient completed split night sleep study on Friday 3/26 - Will follow-up with Dr. Halford Chessman after finalized results    Martyn Ehrich, NP 03/08/2020

## 2020-03-08 NOTE — Assessment & Plan Note (Signed)
-   Stable  - Patient re-qualified for oxygen at 4L continuous  - Renew oxygen order with DME company [facility]

## 2020-03-15 NOTE — Progress Notes (Signed)
Reviewed and agree with assessment/plan.   Dazja Houchin, MD [facility] 12/06/2016, 12:24 PM Pager:  [phone]  

## 2020-03-22 ENCOUNTER — Telehealth: Payer: Self-pay | Admitting: Pulmonary Disease

## 2020-03-22 DIAGNOSIS — G4733 Obstructive sleep apnea (adult) (pediatric): Secondary | ICD-10-CM | POA: Diagnosis not present

## 2020-03-22 NOTE — Telephone Encounter (Signed)
PG 03/05/20 >> AHI 1.1, SpO2 low 85%.  Used 4 liters supplemental oxygen.   Please let her know that her sleep study didn't show sleep apnea, and she wouldn't qualify for CPAP therapy.  She did have low oxygen at night that was controlled by using 4 liters oxygen while asleep, and should continue this.    She needs ROV with me or NP to discuss whether she would qualify for PAP therapy based on history of chronic hypoxic and hypercapnic respiratory failure.  Can be tele visit.

## 2020-03-22 NOTE — Procedures (Signed)
    Patient Name: [name], [name] Date: 03/05/2020 Gender: Female D.O.B: 1956/08/22 Age (years): 24 Referring Provider: Chesley Mires MD, ABSM Height (inches): 65 Interpreting Physician: Chesley Mires MD, ABSM Weight (lbs): 270 RPSGT: Earney [place] BMI: 38 MRN: [id number] Neck Size: 18.00  CLINICAL INFORMATION Sleep Study Type: NPSG  Indication for sleep study: She has history of sleep apnea and chronic respiratory failure.  She has not been on PAP therapy recently.  She presents for reassesment of obstructive sleep apnea.  Epworth Sleepiness Score: 21  SLEEP STUDY TECHNIQUE As per the AASM Manual for the Scoring of Sleep and Associated Events v2.3 (April 2016) with a hypopnea requiring 4% desaturations.  The channels recorded and monitored were frontal, central and occipital EEG, electrooculogram (EOG), submentalis EMG (chin), nasal and oral airflow, thoracic and abdominal wall motion, anterior tibialis EMG, snore microphone, electrocardiogram, and pulse oximetry.  MEDICATIONS Medications self-administered by patient taken the night of the study : N/A  SLEEP ARCHITECTURE The study was initiated at 10:37:32 PM and ended at 4:41:47 AM.  Sleep onset time was 0.9 minutes and the sleep efficiency was 87.6%%. The total sleep time was 319 minutes.  Stage REM latency was 108.5 minutes.  The patient spent 0.9%% of the night in stage N1 sleep, 48.1%% in stage N2 sleep, 34.0%% in stage N3 and 16.9% in REM.  Alpha intrusion was absent.  Supine sleep was 0.00%.  RESPIRATORY PARAMETERS The overall apnea/hypopnea index (AHI) was 1.1 per hour. There were 6 total apneas, including 5 obstructive, 1 central and 0 mixed apneas. There were 0 hypopneas and 12 RERAs.  The AHI during Stage REM sleep was 1.1 per hour.  AHI while supine was N/A per hour.  The mean oxygen saturation was 92.7%. The minimum SpO2 during sleep was 85.0%.  Study done with her using 4 liters supplemental  oxygen.  soft snoring was noted during this study.  CARDIAC DATA The 2 lead EKG demonstrated sinus rhythm. The mean heart rate was 79.3 beats per minute. Other EKG findings include: None . LEG MOVEMENT DATA The total PLMS were 0 with a resulting PLMS index of 0.0. Associated arousal with leg movement index was 0.0 .  IMPRESSIONS - No significant obstructive sleep apnea occurred during this study (AHI = 1.1/h). - No significant central sleep apnea occurred during this study (CAI = 0.2/h). - Mild oxygen desaturation was noted during this study (Min O2 = 85.0%). - The patient snored with soft snoring volume. - No cardiac abnormalities were noted during this study. - Clinically significant periodic limb movements did not occur during sleep. No significant associated arousals.  DIAGNOSIS - Nocturnal Hypoxemia (327.26 [G47.36 ICD-10])  RECOMMENDATIONS - Continue 4 liters supplemental oxygen at night. - Outpatient assessment to determine if she would qualify for PAP therapy based on chronic respiratory failure.  [Electronically signed] 03/22/2020 09:55 AM  Chesley Mires MD, ABSM Diplomate, American Board of Sleep Medicine   NPI: [id number]

## 2020-03-24 NOTE — Telephone Encounter (Signed)
ATC patient but her VM was not setup. Will attempt to call back later.

## 2020-04-01 NOTE — Telephone Encounter (Signed)
Spoke with patient. She is aware of results. She has been scheduled for a televisit with Beth on 04/08/20 at 11am.   Nothing further needed at time of call.

## 2020-04-08 ENCOUNTER — Ambulatory Visit (INDEPENDENT_AMBULATORY_CARE_PROVIDER_SITE_OTHER): Payer: Medicare HMO | Admitting: Primary Care

## 2020-04-08 ENCOUNTER — Other Ambulatory Visit: Payer: Self-pay

## 2020-04-08 ENCOUNTER — Telehealth: Payer: Self-pay | Admitting: Primary Care

## 2020-04-08 DIAGNOSIS — J9611 Chronic respiratory failure with hypoxia: Secondary | ICD-10-CM

## 2020-04-08 DIAGNOSIS — R0902 Hypoxemia: Secondary | ICD-10-CM

## 2020-04-08 DIAGNOSIS — E662 Morbid (severe) obesity with alveolar hypoventilation: Secondary | ICD-10-CM

## 2020-04-08 DIAGNOSIS — J9612 Chronic respiratory failure with hypercapnia: Secondary | ICD-10-CM

## 2020-04-08 NOTE — Progress Notes (Signed)
Virtual Visit via Telephone Note  I connected with [name] on 04/08/20 at 11:00 AM EDT by telephone and verified that I am speaking with the correct person using two identifiers.  Location: Patient: Home Provider: Office   I discussed the limitations, risks, security and privacy concerns of performing an evaluation and management service by telephone and the availability of in person appointments. I also discussed with the patient that there may be a patient responsible charge related to this service. The patient expressed understanding and agreed to proceed.   History of Present Illness: 64 year old female, former smoker quit in 1975.  Past medical history significant for COPD, obesity hypoventilation syndrome, obstructive sleep apnea (no currently on bipap), chronic respiratory failure with hypoxia, asthma, hypertension, systolic and diastolic heart failure, A. fib, GERD. Patient of Dr. Halford Chessman, last seen on 12/30/19. Ordered for in-lab sleep study.  Previous [facility] pulmonary encounter: 03/08/2020 Patient presents today for office visit to qualify for oxygen. Health wise she is feeling well. No acute complaints. She uses 4L oxygen 24/7 at home. DME company at [facility]. She has pulsed concentrator with her today. She had a split night sleep study done three days ago, results are still pending. States that she has to go back for titration. She continues to work on weight loss, she has lost 40 lbs over the last couple of years.    04/08/2020 Patient contacted today to review sleep study. Split night sleep study did not show evidence of obstructive sleep apnea, AHI 1.1. SpO2 low 85%. She did require 4L oxygen. Recommending auto PAP to support respiratory status. She is agreeing to resume therapy. She feels sleepy during the day and snores at night.   Observations/Objective:  - Appears well, able to speak in full sentences.   Cardiac testing: Echo 04/10/18 >> mild LVH, EF 50 to 55%, mild MR, PAS  54 mmHg  Sleep tests: PSG 03/26/12 >> AHI 36.6, SpO2 low 91%. Test done with 2 liters oxygen. PLMI 3.1. PVC's, bigeminy. BiPAP 05/23/12 to 07/08/12 >> Used on 47 of 47 nights with average 4 hrs 48 min. Average AHI 11 with BiPAP 13/9 cm H2O. PG 03/05/20 >> AHI 1.1, SpO2 low 85%.  Used 4 liters supplemental oxygen  Assessment and Plan: Sleep study did not show evidence of obstructive sleep apnea, however, due to low level of oxygen and and history of respiratory failure recommended Auto APAP which is similar to CPAP but adjusts to actual breathing pattern  Chronic respiratory failure with hypoxia and hypercapnia/ Obesity hypoventilation syndrome - No evidence of OSA on split night sleep study; AHI 1.1, SpO2 low 85% on 4L oxygen  - Start auto PAP - Continue 4L oxygen 24/7  - Check BMET next visit   Follow Up Instructions:  - Follow-up in 6-8 weeks    I discussed the assessment and treatment plan with the patient. The patient was provided an opportunity to ask questions and all were answered. The patient agreed with the plan and demonstrated an understanding of the instructions.   The patient was advised to call back or seek an in-person evaluation if the symptoms worsen or if the condition fails to improve as anticipated.  I provided 18 minutes of non-face-to-face time during this encounter.   Martyn Ehrich, NP

## 2020-04-08 NOTE — Telephone Encounter (Signed)
Dr. Halford Chessman - Is there a set for auto PAP?  Cc: [name]

## 2020-04-08 NOTE — Patient Instructions (Addendum)
Sleep study did not show evidence of obstructive sleep apnea, however, due to low level of oxygen and you requiring 4L and history of respiratory failure Dr. Halford Chessman recommend Auto APAP which is similar to CPAP but adjusts to actual breathing pattern  Orders: Auto PAP with oxygen   Orders: BMET prior to next visit  Follow-up: 6-8 weeks with Dr. Halford Chessman

## 2020-04-12 NOTE — Progress Notes (Signed)
Reviewed and agree with assessment/plan.   Falynn Ailey, MD [facility] 12/06/2016, 12:24 PM Pager:  [phone]  

## 2020-04-13 NOTE — Telephone Encounter (Signed)
Spoke with patient. She is interested in having a PFT and ABG. She is aware that the ABG will done at the hospital. She is also aware that she will need to be tested for COVID 2-3 days before hand. Since she lives in [place], she would like to see if she could get her COVID test at [facility] in [place]. Advised her that I could call them to see what kind of covid testing they offer.   [facility] at [phone] and spoke with Marjory Lies. She stated that they do not do covid testing. They stopped a few weeks ago.   Called and spoke with patient to make her aware. Will continue to see if I can find a PCR testing site for her.

## 2020-04-13 NOTE — Telephone Encounter (Signed)
OK. Will call her.  Triage, please order PFTs and ABG on supplemental oxygen

## 2020-04-13 NOTE — Telephone Encounter (Signed)
She does not have obstructive sleep apnea.  She would need assessment for Bipap for chronic respiratory failure.  In order to qualify, she would need a PFT and ABG on her baseline supplemental oxygen setting to determine if she has elevated carbon dioxide level.  If she is agreeable to have these tests done, then please place order.

## 2020-04-29 ENCOUNTER — Telehealth: Payer: Self-pay | Admitting: Pulmonary Disease

## 2020-04-29 NOTE — Telephone Encounter (Signed)
See PN dated 04/08/20

## 2020-04-29 NOTE — Telephone Encounter (Signed)
Pt calling back today to check status of getting her PFT and ABG scheduled  I ordered the ABG and got her scheduled for PFT and covid test through [facility]

## 2020-06-12 ENCOUNTER — Inpatient Hospital Stay (HOSPITAL_COMMUNITY): Admission: RE | Admit: 2020-06-12 | Payer: Medicare HMO | Source: Ambulatory Visit

## 2020-06-25 ENCOUNTER — Ambulatory Visit: Payer: Medicare HMO | Admitting: Pulmonary Disease

## 2020-07-06 ENCOUNTER — Telehealth: Payer: Self-pay | Admitting: Pulmonary Disease

## 2020-07-06 NOTE — Telephone Encounter (Signed)
Needs ROV to assess.

## 2020-07-06 NOTE — Telephone Encounter (Signed)
Called and spoke with pt to see if I could get her scheduled for an appt with SG tomorrow 7/28. Pt stated that she usually needs a 3-day notice for transportation. She stated that she has an appt scheduled 8/4 that she is going to keep. Stated to pt if she becomes worse and is not able to be brought to the office for a sooner appt to either go to UC or ED for evaluation and she verbalized understanding. Nothing further needed.

## 2020-07-06 NOTE — Telephone Encounter (Signed)
Called and spoke with patient she states that her breathing has got a little worse over the last couple days. States her sats are 89-93% and she sits down and takes some deep breaths to get it up to around 96%. States this has been going on the last couple weeks but last couple days has been worse. On 4 liters all the time  Denies cough  Patient is scheduled for PFT on 07/14/20. Has not been using rescue inhaler. Is not on any other inhalers or nebs.    Dr. Halford Chessman please advise on any recommendations for this patient

## 2020-07-14 ENCOUNTER — Ambulatory Visit (INDEPENDENT_AMBULATORY_CARE_PROVIDER_SITE_OTHER): Payer: Medicare HMO | Admitting: Pulmonary Disease

## 2020-07-14 ENCOUNTER — Other Ambulatory Visit: Payer: Self-pay

## 2020-07-14 DIAGNOSIS — R0902 Hypoxemia: Secondary | ICD-10-CM

## 2020-07-14 LAB — PULMONARY FUNCTION TEST
DL/VA % pred: 99 %
DL/VA: 4.12 ml/min/mmHg/L
DLCO cor % pred: 58 %
DLCO cor: 11.87 ml/min/mmHg
DLCO unc % pred: 58 %
DLCO unc: 11.87 ml/min/mmHg
FEF 25-75 Post: 1.85 L/sec
FEF 25-75 Pre: 1.37 L/sec
FEF2575-%Change-Post: 34 %
FEF2575-%Pred-Post: 86 %
FEF2575-%Pred-Pre: 64 %
FEV1-%Change-Post: 4 %
FEV1-%Pred-Post: 55 %
FEV1-%Pred-Pre: 53 %
FEV1-Post: 1.39 L
FEV1-Pre: 1.32 L
FEV1FVC-%Change-Post: 2 %
FEV1FVC-%Pred-Pre: 104 %
FEV6-%Change-Post: 2 %
FEV6-%Pred-Post: 53 %
FEV6-%Pred-Pre: 51 %
FEV6-Post: 1.67 L
FEV6-Pre: 1.62 L
FEV6FVC-%Pred-Post: 104 %
FEV6FVC-%Pred-Pre: 104 %
FVC-%Change-Post: 2 %
FVC-%Pred-Post: 51 %
FVC-%Pred-Pre: 50 %
FVC-Post: 1.68 L
FVC-Pre: 1.64 L
Post FEV1/FVC ratio: 83 %
Post FEV6/FVC ratio: 100 %
Pre FEV1/FVC ratio: 80 %
Pre FEV6/FVC Ratio: 100 %
RV % pred: 69 %
RV: 1.49 L
TLC % pred: 73 %
TLC: 3.82 L

## 2020-07-14 NOTE — Progress Notes (Signed)
PFT done today. 

## 2020-07-20 ENCOUNTER — Ambulatory Visit: Payer: Medicare HMO | Admitting: Adult Health

## 2020-08-17 ENCOUNTER — Ambulatory Visit: Payer: Medicare HMO | Admitting: Pulmonary Disease

## 2020-10-13 ENCOUNTER — Other Ambulatory Visit: Payer: Self-pay

## 2020-10-13 ENCOUNTER — Encounter: Payer: Self-pay | Admitting: Pulmonary Disease

## 2020-10-13 ENCOUNTER — Ambulatory Visit (INDEPENDENT_AMBULATORY_CARE_PROVIDER_SITE_OTHER): Payer: Medicare HMO | Admitting: Pulmonary Disease

## 2020-10-13 VITALS — BP 128/64 | HR 95 | Ht 65.0 in | Wt 285.0 lb

## 2020-10-13 DIAGNOSIS — E662 Morbid (severe) obesity with alveolar hypoventilation: Secondary | ICD-10-CM

## 2020-10-13 DIAGNOSIS — J449 Chronic obstructive pulmonary disease, unspecified: Secondary | ICD-10-CM

## 2020-10-13 DIAGNOSIS — J9612 Chronic respiratory failure with hypercapnia: Secondary | ICD-10-CM

## 2020-10-13 DIAGNOSIS — J4489 Other specified chronic obstructive pulmonary disease: Secondary | ICD-10-CM

## 2020-10-13 DIAGNOSIS — J9611 Chronic respiratory failure with hypoxia: Secondary | ICD-10-CM

## 2020-10-13 NOTE — Progress Notes (Signed)
[facility]  Chief Complaint  Patient presents with  . Follow-up    Pt states she has been doing okay since last visit. Pt uses 4L pulse with the POC and also uses 4L continuous.    Constitutional:  BP 128/64 (BP Location: Right Wrist, Cuff Size: Normal)   Pulse 95   Ht 5\' 5"  (1.651 m)   Wt 285 lb (129.3 kg)   SpO2 97%   BMI 47.43 kg/m   Past Medical History:  DM, RLS, Depression, Hypothyroidism, GERD, ADD, chronic back pain, CAD, HTN, combined CHF, Endometrial cancer, Nephrolithiasis, PAF  Past Surgical History:  Her  has a past surgical history that includes Back surgery; Shoulder surgery; Vaginal hysterectomy; Dental surgery; Tonsillectomy; and SP PERC NEPHROSTOMY.  Brief Summary:  [name] is a 64 y.o. female former smoker with obesity hypoventilation syndrome, COPD with asthma, and chronic hypoxic/hypercapnic respiratory failure.      Subjective:   She was in hospital recently for CHF and A fib.  Seeing cardiology with [facility].  Her SpO2 on 4 liters pulsed today was in the 60's.  Improved to > 90% with 4 liters continuous flow.    Sleeps sitting up in a recliner.    Not having cough, wheeze, sputum, or chest pain.  Uses albuterol sporadically and not sure how much it helps.    Physical Exam:   Appearance - sitting in wheelchair, wearing oxygen  ENMT - no sinus tenderness, no oral exudate, no LAN, Mallampati 4 airway, no stridor  Respiratory - equal breath sounds bilaterally, no wheezing or rales  CV - s1s2 regular rate and rhythm, no murmurs  Ext - no clubbing, no edema  Skin - no rashes  Psych - normal mood and affect   Pulmonary testing:   ABG 08/28/17 >> pH 7.34, PCO2 93.3, PO2 69  PFT 07/14/20 >> FEV1 1.39 (55%), FEV1% 83, TLC 3.82 (73%), DLCO 58%  Chest Imaging:    Sleep Tests:   PSG 03/26/12 >> AHI 36.6, SpO2 low 91%. Test done with 2 liters oxygen. PLMI 3.1. PVC's, bigeminy.  BiPAP  05/23/12 to 07/08/12 >> Used on 47 of 47 nights with average 4 hrs 48 min. Average AHI 11 with BiPAP 13/9 cm H2O.  PSG 03/05/20 >> AHI 1.1, SpO2 low 85%.  Used 4 liters supplemental oxygen.  Cardiac Tests:   Echo 04/10/18 >> mild LVH, EF 50 to 55%, mild MR, PAS 54 mmHg  Social History:  She  reports that she quit smoking about 46 years ago. Her smoking use included cigarettes. She has a 1.00 pack-year smoking history. She has never used smokeless tobacco. She reports that she does not drink alcohol and does not use drugs.  Family History:  Her family history includes Cancer in her father.    Discussion:    Assessment/Plan:   Chronic hypoxic/hypercapnic respiratory failure 2nd to obesity hypoventilation syndrome. - recent sleep study was negative for sleep apnea - she needs 4 liters continuous flow oxygen 24/7  Obesity. - discussed importance of weight loss  COPD with asthma. - prn albuterol  Chronic combined CHF, Paroxysmal atrial fibrillation. - followed by Dr. Verdell Face with [facility]  Time Spent Involved in Patient Care on Day of Examination:  27 minutes  Follow up:  Patient Instructions  Will make sure [employer] has your home oxygen set up at 4 liters continuous flow oxygen 24/7  Follow up in 6 months   Medication List:  Allergies as of 10/13/2020      Reactions   Aspirin Other (See Comments)   Chest pain   Eliquis [apixaban]    Excessive bleeding   Nsaids Other (See Comments)   Chest pain   Other Other (See Comments)   "BP med" combined with plavix, caused syncope   Plavix [clopidogrel] Other (See Comments)   syncope   Sudafed [pseudoephedrine] Palpitations   Raised BP   Erythromycin Nausea And Vomiting   Sulfa Antibiotics Other (See Comments)   constipation   Sulfamethoxazole-trimethoprim Diarrhea   Altace [ramipril] Other (See Comments)   Severe fatigue   Zofran [ondansetron Hcl] Other (See Comments)   "I put me into a coma"        Medication List       Accurate as of October 13, 2020 12:16 PM. If you have any questions, ask your nurse or doctor.        STOP taking these medications   nitrofurantoin (macrocrystal-monohydrate) 100 MG capsule Commonly known as: MACROBID Stopped by: Chesley Mires, MD     TAKE these medications   albuterol 108 (90 Base) MCG/ACT inhaler Commonly known as: VENTOLIN HFA Inhale into the lungs.   azelastine 0.05 % ophthalmic solution Commonly known as: OPTIVAR Place 1 drop into both eyes 2 (two) times daily as needed (dry eye irritation).   buprenorphine 8 MG Subl SL tablet Commonly known as: SUBUTEX Place under the tongue.   carvedilol 3.125 MG tablet Commonly known as: COREG Take 6.25 mg by mouth.   Centrum Silver 50+Women Tabs Take 1 tablet by mouth daily.   diltiazem 360 MG 24 hr capsule Commonly known as: TIAZAC Take 1 capsule by mouth daily.   fexofenadine 60 MG tablet Commonly known as: ALLEGRA Take 60 mg by mouth daily.   gabapentin 100 MG capsule Commonly known as: NEURONTIN Take 1 capsule (100 mg total) by mouth 2 (two) times daily.   levothyroxine 88 MCG tablet Commonly known as: SYNTHROID Take 88 mcg by mouth daily before breakfast.   metFORMIN 500 MG tablet Commonly known as: GLUCOPHAGE Take 1 tablet (500 mg total) by mouth 2 (two) times daily with a meal.   omeprazole 20 MG capsule Commonly known as: PRILOSEC Take 1 capsule by mouth daily.   potassium citrate 10 MEQ (1080 MG) SR tablet Commonly known as: UROCIT-K Take by mouth.   prochlorperazine 10 MG tablet Commonly known as: COMPAZINE Take 1 tablet (10 mg total) by mouth every 6 (six) hours as needed for nausea or vomiting.   sertraline 100 MG tablet Commonly known as: ZOLOFT Take 1 tablet (100 mg total) by mouth daily.   Vitamin D3 25 MCG tablet Commonly known as: Vitamin D Take 1,000 Units by mouth daily.       Signature:  Chesley Mires, MD [place] Pager - [phone] 10/13/2020, 12:16 PM

## 2020-10-13 NOTE — Patient Instructions (Signed)
Will make sure [employer] has your home oxygen set up at 4 liters continuous flow oxygen 24/7  Follow up in 6 months

## 2020-10-22 ENCOUNTER — Telehealth: Payer: Self-pay | Admitting: Pulmonary Disease

## 2020-10-22 DIAGNOSIS — E662 Morbid (severe) obesity with alveolar hypoventilation: Secondary | ICD-10-CM

## 2020-10-22 DIAGNOSIS — J9611 Chronic respiratory failure with hypoxia: Secondary | ICD-10-CM

## 2020-10-22 NOTE — Telephone Encounter (Signed)
I don't see that an order for POC was placed see below  Please make sure [employer] has her home oxygen set up at 4 liters continuous flow oxygen 24/7  Who was ordering the POC

## 2020-10-25 NOTE — Addendum Note (Signed)
Addended by: Lorretta Harp on: 10/25/2020 11:38 AM   Modules accepted: Orders

## 2020-10-25 NOTE — Telephone Encounter (Signed)
Order has been placed. Nothing further needed. 

## 2020-10-25 NOTE — Telephone Encounter (Signed)
Ok to place order for [employer] to do best fit eval for POC that provides 4 L continuous flow oxygen to allow patient to go to Dr's appointments. Thanks so much

## 2020-10-25 NOTE — Telephone Encounter (Signed)
Order was placed the day of pt's OV with Dr. Halford Chessman by Dr. Halford Chessman that stated to make sure patient has a home oxygen concentrator set up at 4L continuous as patient requires 4L continuous 24/7 to keep O2 sats stable.  The day of pt's appt, pt was on pulsed O2 and was satting at first in mid 60s and then was able to get up to 83%. Pt required 4L continuous to get O2 sats to a stable range.  Called and spoke with pt letting her know that she would need to have continuous oxygen as the pulsed flow did not keep her sats stable the day of her OV. Stated to pt that we could see if [employer] could do a best fit eval to fit her with a POC that is continuous flow and pt verbalized understanding. Pt said that she needs to have some sort of POC that can be continuous flow so she can use in order to be able to go to doctor's appts.  With Dr. Halford Chessman not showing up in [place], sending this to APP of the day. Sarah, please advise if you are okay with [place] placing order under you for [employer] to do a best fit eval for POC that is continuous flow so patient can be able to use to go to doctors appts.

## 2020-12-20 ENCOUNTER — Telehealth: Payer: Self-pay | Admitting: Pulmonary Disease

## 2020-12-20 NOTE — Telephone Encounter (Signed)
Spoke with the pt  She states her sats have been lower than usual the past 2 days  Her daughter checked a few times while she was sleeping and it was 73-74% on 5lpm continuous o2  She states when she walks to the bathroom it also goes to the mid 70's on the 5lpm  She is at rest now on the phone and it is 97% 6lpm  She states has not had any increase in her SOB, wheezing, cough, f/c/s, aches  She states only symptom is some mild confusion  She states hospitalized at [facility] for pna approx 1 wk ago for 2 days  She already finished abx that were prescribed to her  She tested neg for covid then  Please advise thank you!

## 2020-12-20 NOTE — Telephone Encounter (Signed)
12/20/2020  Patient needs hospital follow-up with our practice with APP in 30-minute time slot or with Dr. Halford Chessman.  Please also request the records from [facility].  We also need the documented records of the negative COVID test.  If symptoms worsen patient needs to seek emergent care for evaluation at an urgent care or emergency room.  Wyn Quaker, FNP

## 2020-12-20 NOTE — Telephone Encounter (Signed)
Called and spoke with Patient. Brian,NP's recommendations given.  Patient scheduled 12/23/20 at 1130 with [facility] for hospital follow up. Patient stated covid testing was done at at [facility]. [place], spoke with Otila Kluver. Requested medical records to be faxed to [facility]. Otila Kluver stated she would fax records this afternoon.

## 2020-12-23 ENCOUNTER — Ambulatory Visit: Payer: Medicare HMO | Admitting: Primary Care

## 2021-03-11 DEATH — deceased
